# Patient Record
Sex: Male | Born: 1945 | Race: White | Hispanic: No | Smoking: Former smoker
Health system: Southern US, Community
[De-identification: ages and names within clinical notes are randomized; demographics above are authoritative.]

## PROBLEM LIST (undated history)

## (undated) DIAGNOSIS — R7303 Prediabetes: Secondary | ICD-10-CM

## (undated) DIAGNOSIS — K219 Gastro-esophageal reflux disease without esophagitis: Secondary | ICD-10-CM

## (undated) DIAGNOSIS — Q231 Congenital insufficiency of aortic valve: Secondary | ICD-10-CM

## (undated) DIAGNOSIS — Z85828 Personal history of other malignant neoplasm of skin: Secondary | ICD-10-CM

## (undated) DIAGNOSIS — R011 Cardiac murmur, unspecified: Secondary | ICD-10-CM

## (undated) DIAGNOSIS — R490 Dysphonia: Secondary | ICD-10-CM

## (undated) DIAGNOSIS — Z954 Presence of other heart-valve replacement: Secondary | ICD-10-CM

## (undated) DIAGNOSIS — I742 Embolism and thrombosis of arteries of the upper extremities: Secondary | ICD-10-CM

## (undated) DIAGNOSIS — E781 Pure hyperglyceridemia: Secondary | ICD-10-CM

## (undated) DIAGNOSIS — C61 Malignant neoplasm of prostate: Secondary | ICD-10-CM

## (undated) DIAGNOSIS — I341 Nonrheumatic mitral (valve) prolapse: Secondary | ICD-10-CM

## (undated) DIAGNOSIS — Z87891 Personal history of nicotine dependence: Secondary | ICD-10-CM

## (undated) DIAGNOSIS — R0602 Shortness of breath: Secondary | ICD-10-CM

## (undated) DIAGNOSIS — Q2381 Bicuspid aortic valve: Secondary | ICD-10-CM

## (undated) DIAGNOSIS — J383 Other diseases of vocal cords: Secondary | ICD-10-CM

## (undated) DIAGNOSIS — Z86718 Personal history of other venous thrombosis and embolism: Secondary | ICD-10-CM

## (undated) DIAGNOSIS — I340 Nonrheumatic mitral (valve) insufficiency: Secondary | ICD-10-CM

## (undated) DIAGNOSIS — R51 Headache: Secondary | ICD-10-CM

## (undated) DIAGNOSIS — I719 Aortic aneurysm of unspecified site, without rupture: Secondary | ICD-10-CM

## (undated) DIAGNOSIS — C329 Malignant neoplasm of larynx, unspecified: Secondary | ICD-10-CM

## (undated) DIAGNOSIS — R7301 Impaired fasting glucose: Secondary | ICD-10-CM

## (undated) DIAGNOSIS — I428 Other cardiomyopathies: Secondary | ICD-10-CM

## (undated) DIAGNOSIS — R519 Headache, unspecified: Secondary | ICD-10-CM

## (undated) DIAGNOSIS — Z973 Presence of spectacles and contact lenses: Secondary | ICD-10-CM

## (undated) DIAGNOSIS — Z9889 Other specified postprocedural states: Secondary | ICD-10-CM

## (undated) DIAGNOSIS — R972 Elevated prostate specific antigen [PSA]: Secondary | ICD-10-CM

## (undated) DIAGNOSIS — E782 Mixed hyperlipidemia: Secondary | ICD-10-CM

## (undated) DIAGNOSIS — I519 Heart disease, unspecified: Secondary | ICD-10-CM

## (undated) DIAGNOSIS — C4491 Basal cell carcinoma of skin, unspecified: Secondary | ICD-10-CM

## (undated) DIAGNOSIS — K8020 Calculus of gallbladder without cholecystitis without obstruction: Secondary | ICD-10-CM

## (undated) HISTORY — DX: Nonrheumatic mitral (valve) insufficiency: I34.0

## (undated) HISTORY — PX: TRANSTHORACIC ECHOCARDIOGRAM: SHX275

## (undated) HISTORY — DX: Elevated prostate specific antigen (PSA): R97.20

## (undated) HISTORY — DX: Embolism and thrombosis of arteries of the upper extremities: I74.2

## (undated) HISTORY — DX: Calculus of gallbladder without cholecystitis without obstruction: K80.20

## (undated) HISTORY — DX: Malignant neoplasm of larynx, unspecified: C32.9

## (undated) HISTORY — DX: Heart disease, unspecified: I51.9

## (undated) HISTORY — DX: Impaired fasting glucose: R73.01

## (undated) HISTORY — DX: Dysphonia: R49.0

## (undated) HISTORY — DX: Pure hyperglyceridemia: E78.1

## (undated) HISTORY — PX: CARDIAC VALVE REPLACEMENT: SHX585

## (undated) HISTORY — DX: Congenital insufficiency of aortic valve: Q23.1

## (undated) HISTORY — DX: Personal history of other venous thrombosis and embolism: Z86.718

## (undated) HISTORY — DX: Malignant neoplasm of prostate: C61

## (undated) HISTORY — DX: Gastro-esophageal reflux disease without esophagitis: K21.9

## (undated) HISTORY — DX: Personal history of nicotine dependence: Z87.891

## (undated) HISTORY — PX: TONSILLECTOMY AND ADENOIDECTOMY: SUR1326

## (undated) HISTORY — DX: Aortic aneurysm of unspecified site, without rupture: I71.9

## (undated) HISTORY — PX: COLONOSCOPY: SHX174

## (undated) HISTORY — DX: Nonrheumatic mitral (valve) prolapse: I34.1

## (undated) HISTORY — DX: Bicuspid aortic valve: Q23.81

---
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
## 1978-12-21 HISTORY — PX: CHOLECYSTECTOMY: SHX55

## 2000-12-21 DIAGNOSIS — Z8521 Personal history of malignant neoplasm of larynx: Secondary | ICD-10-CM

## 2000-12-21 DIAGNOSIS — C329 Malignant neoplasm of larynx, unspecified: Secondary | ICD-10-CM

## 2000-12-21 HISTORY — DX: Personal history of malignant neoplasm of larynx: Z85.21

## 2000-12-21 HISTORY — PX: LARYNX SURGERY: SHX692

## 2000-12-21 HISTORY — DX: Malignant neoplasm of larynx, unspecified: C32.9

## 2013-07-21 ENCOUNTER — Encounter: Payer: Self-pay | Admitting: Family Medicine

## 2013-07-21 ENCOUNTER — Ambulatory Visit (INDEPENDENT_AMBULATORY_CARE_PROVIDER_SITE_OTHER): Payer: Medicare Other | Admitting: Family Medicine

## 2013-07-21 VITALS — BP 139/71 | HR 67 | Temp 97.2°F | Resp 18 | Ht 72.0 in | Wt 203.0 lb

## 2013-07-21 DIAGNOSIS — E781 Pure hyperglyceridemia: Secondary | ICD-10-CM

## 2013-07-21 DIAGNOSIS — Z7689 Persons encountering health services in other specified circumstances: Secondary | ICD-10-CM

## 2013-07-21 DIAGNOSIS — Z7189 Other specified counseling: Secondary | ICD-10-CM

## 2013-07-21 NOTE — Progress Notes (Signed)
Office Note 09/01/2013  CC:  Chief Complaint  Patient presents with  . Establish Care    HPI:  Kenneth Ryan is a 67 y.o. White male who is here to establish care. Patient's most recent primary MD: Dr. Adline Potter, in [place]. Old records were not reviewed prior to or during today's visit.  No acute complaints today.  Has recently had "routine" labs earlier this summer.  Has also had routine prostate cancer screening and colon cancer screening.  Takes fenofibrate daily, no side effects.    Past Medical History  Diagnosis Date  . Hypertriglyceridemia   . 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  Marland Kitchen Chronic hoarseness     secondary to hx of laryngeal surgery    Past Surgical History  Procedure Laterality Date  . Cholecystectomy  1980  . Tonsilectomy/adenoidectomy with myringotomy  1960  . Colonoscopy  05/23/2002 and May 23, 2012    Both normal.    Family History  Problem Relation Age of Onset  . Heart disease Father     History   Social History  . Marital Status: Married    Spouse Name: N/A    Number of Children: N/A  . Years of Education: N/A   Occupational History  . Not on file.   Social History Main Topics  . Smoking status: Former Smoker    Quit date: 07/22/1979  . Smokeless tobacco: Never Used  . Alcohol Use: Yes  . Drug Use: No  . Sexual Activity: Not on file   Other Topics Concern  . Not on file   Social History Narrative   Married, one son deceased 2011/05/24.   Relocated from [place] to [facility] 06/2013 to be near grandchildren.   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   Smoker 20 pack-yr hx: quit about 1980.   Alcohol: 3 bottles of beer per week avg.     Drugs: none             Outpatient Encounter Prescriptions as of 07/21/2013  Medication Sig Dispense Refill  . fenofibrate (TRICOR) 145 MG tablet Take 145 mg by mouth daily.       No facility-administered  encounter medications on file as of 07/21/2013.    No Known Allergies  ROS Review of Systems  Constitutional: Negative for fever and fatigue.  HENT: Negative for congestion and sore throat.   Eyes: Negative for visual disturbance.  Respiratory: Negative for cough.   Cardiovascular: Negative for chest pain.  Gastrointestinal: Negative for nausea and abdominal pain.  Genitourinary: Negative for dysuria.  Musculoskeletal: Negative for back pain and joint swelling.  Skin: Negative for rash.  Neurological: Negative for weakness and headaches.  Hematological: Negative for adenopathy.     PE; Blood pressure 139/71, pulse 67, temperature 97.2 F (36.2 C), temperature source Temporal, resp. rate 18, height 6' (1.829 m), weight 203 lb (92.08 kg), SpO2 97.00%. Gen: Alert, well appearing.  Patient is oriented to person, place, time, and situation. ENT:   Eyes: no injection, icteris, swelling, or exudate.  EOMI, PERRLA. Nose: no drainage or turbinate edema/swelling.  No injection or focal lesion.  Mouth: lips without lesion/swelling.  Oral mucosa pink and moist.   Oropharynx without erythema, exudate, or swelling.  Neck - No masses or thyromegaly or limitation in range of motion CV: RRR, no m/r/g.   LUNGS: CTA bilat, nonlabored resps, good aeration in all lung fields. EXT: no clubbing  or cyanosis.  Some scattered LE varicosities anteriorly, with trace bilat pitting edema in same areas, +mild hemosiderin pigmentary skin changes in same areas.  Pertinent labs:  none  ASSESSMENT AND PLAN:   New pt  Hypertriglyceridemia Continue fenofibrate and low fat diet.  Exercise discussed/encouraged.  Encounter to establish care Discussed history. Will obtain old records.  No acute needs at this time.  An After Visit Summary was printed and given to the patient.  Return in about 6 months (around 01/21/2014) for CPE with fasting labs that day.

## 2013-09-01 ENCOUNTER — Encounter: Payer: Self-pay | Admitting: Family Medicine

## 2013-09-01 DIAGNOSIS — Z7689 Persons encountering health services in other specified circumstances: Secondary | ICD-10-CM | POA: Insufficient documentation

## 2013-09-01 DIAGNOSIS — E781 Pure hyperglyceridemia: Secondary | ICD-10-CM | POA: Insufficient documentation

## 2013-09-01 NOTE — Assessment & Plan Note (Signed)
Continue fenofibrate and low fat diet.  Exercise discussed/encouraged.

## 2013-09-01 NOTE — Assessment & Plan Note (Signed)
Discussed history. Will obtain old records.  No acute needs at this time.

## 2013-09-15 ENCOUNTER — Ambulatory Visit (INDEPENDENT_AMBULATORY_CARE_PROVIDER_SITE_OTHER): Payer: Medicare Other

## 2013-09-15 DIAGNOSIS — Z23 Encounter for immunization: Secondary | ICD-10-CM

## 2014-01-22 ENCOUNTER — Encounter: Payer: Self-pay | Admitting: Family Medicine

## 2014-01-22 ENCOUNTER — Ambulatory Visit (INDEPENDENT_AMBULATORY_CARE_PROVIDER_SITE_OTHER): Payer: Medicare Other | Admitting: Family Medicine

## 2014-01-22 VITALS — BP 131/73 | HR 78 | Temp 98.4°F | Ht 72.0 in | Wt 212.0 lb

## 2014-01-22 DIAGNOSIS — Z0000 Encounter for general adult medical examination without abnormal findings: Secondary | ICD-10-CM

## 2014-01-22 DIAGNOSIS — R011 Cardiac murmur, unspecified: Secondary | ICD-10-CM

## 2014-01-22 DIAGNOSIS — E781 Pure hyperglyceridemia: Secondary | ICD-10-CM

## 2014-01-22 DIAGNOSIS — Z0389 Encounter for observation for other suspected diseases and conditions ruled out: Secondary | ICD-10-CM

## 2014-01-22 LAB — COMPREHENSIVE METABOLIC PANEL
ALBUMIN: 3.9 g/dL (ref 3.5–5.2)
ALK PHOS: 45 U/L (ref 39–117)
ALT: 24 U/L (ref 0–53)
AST: 25 U/L (ref 0–37)
BUN: 13 mg/dL (ref 6–23)
CALCIUM: 9.6 mg/dL (ref 8.4–10.5)
CHLORIDE: 109 meq/L (ref 96–112)
CO2: 26 mEq/L (ref 19–32)
Creatinine, Ser: 0.8 mg/dL (ref 0.4–1.5)
GFR: 104.4 mL/min (ref >60.00)
Glucose, Bld: 99 mg/dL (ref 70–99)
POTASSIUM: 4.2 meq/L (ref 3.5–5.1)
SODIUM: 142 meq/L (ref 135–145)
Total Bilirubin: 0.8 mg/dL (ref 0.3–1.2)
Total Protein: 6.5 g/dL (ref 6.0–8.3)

## 2014-01-22 LAB — CBC WITH DIFFERENTIAL/PLATELET
BASOS PCT: 0.8 % (ref 0.0–3.0)
Basophils Absolute: 0.1 10*3/uL (ref 0.0–0.1)
EOS PCT: 3.5 % (ref 0.0–5.0)
Eosinophils Absolute: 0.2 10*3/uL (ref 0.0–0.7)
HEMATOCRIT: 44.2 % (ref 39.0–52.0)
HEMOGLOBIN: 14.7 g/dL (ref 13.0–17.0)
LYMPHS ABS: 2.3 10*3/uL (ref 0.7–4.0)
Lymphocytes Relative: 36.7 % (ref 12.0–46.0)
MCHC: 33.2 g/dL (ref 30.0–36.0)
MCV: 88.7 fl (ref 78.0–100.0)
MONO ABS: 0.4 10*3/uL (ref 0.1–1.0)
MONOS PCT: 6.8 % (ref 3.0–12.0)
NEUTROS ABS: 3.2 10*3/uL (ref 1.4–7.7)
Neutrophils Relative %: 52.2 % (ref 43.0–77.0)
PLATELETS: 205 10*3/uL (ref 150.0–400.0)
RBC: 4.99 Mil/uL (ref 4.22–5.81)
RDW: 14.3 % (ref 11.5–14.6)
WBC: 6.2 10*3/uL (ref 4.5–10.5)

## 2014-01-22 LAB — LIPID PANEL
CHOL/HDL RATIO: 4
Cholesterol: 174 mg/dL (ref 0–200)
HDL: 40.3 mg/dL (ref >39.00)
LDL CALC: 102 mg/dL — AB (ref 0–99)
Triglycerides: 159 mg/dL — ABNORMAL HIGH (ref 0.0–149.0)
VLDL: 31.8 mg/dL (ref 0.0–40.0)

## 2014-01-22 LAB — PSA, MEDICARE: PSA: 2.16 ng/mL (ref 0.10–4.00)

## 2014-01-22 LAB — TSH: TSH: 3.53 u[IU]/mL (ref 0.35–5.50)

## 2014-01-22 NOTE — Assessment & Plan Note (Signed)
FLP today. If stable, will do tricor generic RF x 1 yr.

## 2014-01-22 NOTE — Assessment & Plan Note (Signed)
With mid systolic click, consider mitral valve prolapse with mitral regurg. He says he has had this physical exam finding noted before and "they worked it up and nothing showed up".  He can't be specific about w/u or the time that this w/u was done, but he seems to be doubtful that an echocardiogram was done.  Will request old records again. If no echo ever done then will recommend this to pt.

## 2014-01-22 NOTE — Progress Notes (Signed)
Pre-visit discussion using our clinic review tool. No additional management support is needed unless otherwise documented below in the visit note.  

## 2014-01-22 NOTE — Assessment & Plan Note (Signed)
Reviewed age and gender appropriate health maintenance issues (prudent diet, regular exercise, health risks of tobacco and excessive alcohol, use of seatbelts, fire alarms in home, use of sunscreen).  Also reviewed age and gender appropriate health screening as well as vaccine recommendations. Fasting health panel today. DRE normal today.  PSA added to labs. UTD on colon ca screening. Need old records for complete vaccine review/needs---will request these once again.

## 2014-01-22 NOTE — Progress Notes (Signed)
Office Note 01/22/2014  CC:  Chief Complaint  Patient presents with  . Annual Exam    HPI:  Kenneth Ryan is a 68 y.o. White male who is here for CPE. He feels well, no acute complaints. Compliant with generic tricor. Fasting today.  Past Medical History  Diagnosis Date  . Hypertriglyceridemia   . Laryngeal cancer Apr 15, 2001    Surgery and radiation---He had post treatment surveillance x 12 yrs and was officially "released" from f/u in summer 2014.  Marland Kitchen Chronic hoarseness     secondary to hx of laryngeal surgery  . History of tobacco abuse     Past Surgical History  Procedure Laterality Date  . Cholecystectomy  1980  . Tonsilectomy/adenoidectomy with myringotomy  1960  . Colonoscopy  04-15-02 and Apr 15, 2012    Both normal.    Family History  Problem Relation Age of Onset  . Heart disease Father   . Cancer Mother     breast    History   Social History  . Marital Status: Married    Spouse Name: N/A    Number of Children: N/A  . Years of Education: N/A   Occupational History  . Not on file.   Social History Main Topics  . Smoking status: Former Smoker    Quit date: 07/22/1979  . Smokeless tobacco: Never Used  . Alcohol Use: Yes  . Drug Use: No  . Sexual Activity: Not on file   Other Topics Concern  . Not on file   Social History Narrative   Married, one son deceased 04-16-11.   Relocated from [place] to [facility] 06/2013 to be near grandchildren.   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   Smoker 20 pack-yr hx: quit about 1980.   Alcohol: 3 bottles of beer per week avg.     Drugs: none             Outpatient Prescriptions Prior to Visit  Medication Sig Dispense Refill  . fenofibrate (TRICOR) 145 MG tablet Take 145 mg by mouth daily.       No facility-administered medications prior to visit.  ASA 81mg  qd MVI qd  No Known Allergies  ROS Review of Systems  Constitutional: Negative for fever, chills, appetite change and fatigue.  HENT: Negative for congestion, dental  problem, ear pain and sore throat.   Eyes: Negative for discharge, redness and visual disturbance.  Respiratory: Negative for cough, chest tightness, shortness of breath and wheezing.   Cardiovascular: Negative for chest pain, palpitations and leg swelling.  Gastrointestinal: Negative for nausea, vomiting, abdominal pain, diarrhea and blood in stool.  Genitourinary: Negative for dysuria, urgency, frequency, hematuria, flank pain and difficulty urinating.  Musculoskeletal: Negative for arthralgias, back pain, joint swelling, myalgias and neck stiffness.  Skin: Negative for pallor and rash.  Neurological: Negative for dizziness, speech difficulty, weakness and headaches.  Hematological: Negative for adenopathy. Does not bruise/bleed easily.  Psychiatric/Behavioral: Negative for confusion and sleep disturbance. The patient is not nervous/anxious.      PE; Blood pressure 131/73, pulse 78, temperature 98.4 F (36.9 C), temperature source Temporal, height 6' (1.829 m), weight 212 lb (96.163 kg), SpO2 94.00%. Gen: Alert, well appearing.  Patient is oriented to person, place, time, and situation. AFFECT: pleasant, lucid thought and speech. ENT: Ears: EACs clear, normal epithelium.  TMs with good light reflex and landmarks bilaterally.  Eyes: no injection, icteris, swelling, or exudate.  EOMI, PERRLA. Nose: no drainage or turbinate edema/swelling.  No injection or focal lesion.  Mouth: lips without  lesion/swelling.  Oral mucosa pink and moist.  Dentition intact and without obvious caries or gingival swelling.  Oropharynx without erythema, exudate, or swelling.  Neck: supple/nontender.  No LAD, mass, or TM.  Carotid pulses 2+ bilaterally, without bruits. CV: RRR, with mid systolic click and a faint systolic murmur heard at cardiac apex, no rub or gallop.   LUNGS: CTA bilat, nonlabored resps, good aeration in all lung fields. ABD: soft, NT, ND, BS normal.  No hepatospenomegaly or mass.  No bruits. EXT:  no clubbing, cyanosis, or edema.  Musculoskeletal: no joint swelling, erythema, warmth, or tenderness.  ROM of all joints intact. Skin - no sores or suspicious lesions or rashes or color changes Rectal exam: negative without mass, lesions or tenderness, PROSTATE EXAM: smooth and symmetric without nodules or tenderness.   Pertinent labs:  None today  ASSESSMENT AND PLAN:   Health maintenance examination Reviewed age and gender appropriate health maintenance issues (prudent diet, regular exercise, health risks of tobacco and excessive alcohol, use of seatbelts, fire alarms in home, use of sunscreen).  Also reviewed age and gender appropriate health screening as well as vaccine recommendations. Fasting health panel today. DRE normal today.  PSA added to labs. UTD on colon ca screening. Need old records for complete vaccine review/needs---will request these once again.  Hypertriglyceridemia FLP today. If stable, will do tricor generic RF x 1 yr.  Systolic murmur With mid systolic click, consider mitral valve prolapse with mitral regurg. He says he has had this physical exam finding noted before and "they worked it up and nothing showed up".  He can't be specific about w/u or the time that this w/u was done, but he seems to be doubtful that an echocardiogram was done.  Will request old records again. If no echo ever done then will recommend this to pt.   An After Visit Summary was printed and given to the patient.  FOLLOW UP:  Return in about 1 year (around 01/22/2015) for CPE (fasting).

## 2014-01-23 ENCOUNTER — Other Ambulatory Visit: Payer: Self-pay | Admitting: Family Medicine

## 2014-01-23 MED ORDER — FENOFIBRATE 145 MG PO TABS: 145.0000 mg | ORAL_TABLET | Freq: Every day | ORAL | Status: DC

## 2014-02-26 ENCOUNTER — Telehealth: Payer: Self-pay | Admitting: Family Medicine

## 2014-02-26 MED ORDER — FENOFIBRATE 145 MG PO TABS: 145.0000 mg | ORAL_TABLET | Freq: Every day | ORAL | Status: DC

## 2014-09-12 ENCOUNTER — Other Ambulatory Visit: Payer: Self-pay | Admitting: Family Medicine

## 2014-09-12 MED ORDER — FENOFIBRATE 145 MG PO TABS: 145.0000 mg | ORAL_TABLET | Freq: Every day | ORAL | Status: DC

## 2015-02-18 ENCOUNTER — Other Ambulatory Visit: Payer: Self-pay | Admitting: Family Medicine

## 2015-02-18 ENCOUNTER — Encounter: Payer: Self-pay | Admitting: Family Medicine

## 2015-02-18 ENCOUNTER — Ambulatory Visit (INDEPENDENT_AMBULATORY_CARE_PROVIDER_SITE_OTHER): Payer: Medicare Other | Admitting: Family Medicine

## 2015-02-18 ENCOUNTER — Ambulatory Visit (HOSPITAL_BASED_OUTPATIENT_CLINIC_OR_DEPARTMENT_OTHER)
Admission: RE | Admit: 2015-02-18 | Discharge: 2015-02-18 | Disposition: A | Discharge status: Home / Self Care | Payer: Medicare Other | Source: Ambulatory Visit | Attending: Family Medicine | Admitting: Family Medicine

## 2015-02-18 VITALS — BP 152/69 | HR 87 | Temp 98.4°F | Resp 18 | Ht 72.0 in | Wt 213.0 lb

## 2015-02-18 DIAGNOSIS — J040 Acute laryngitis: Secondary | ICD-10-CM

## 2015-02-18 DIAGNOSIS — K219 Gastro-esophageal reflux disease without esophagitis: Secondary | ICD-10-CM

## 2015-02-18 DIAGNOSIS — R05 Cough: Secondary | ICD-10-CM | POA: Insufficient documentation

## 2015-02-18 DIAGNOSIS — R059 Cough, unspecified: Secondary | ICD-10-CM

## 2015-02-18 DIAGNOSIS — J690 Pneumonitis due to inhalation of food and vomit: Secondary | ICD-10-CM

## 2015-02-18 DIAGNOSIS — J208 Acute bronchitis due to other specified organisms: Secondary | ICD-10-CM

## 2015-02-18 MED ORDER — PANTOPRAZOLE SODIUM 40 MG PO TBEC: 40.0000 mg | DELAYED_RELEASE_TABLET | Freq: Every day | ORAL | Status: DC

## 2015-02-18 MED ORDER — AZITHROMYCIN 250 MG PO TABS: ORAL_TABLET | ORAL | Status: DC

## 2015-02-18 NOTE — Progress Notes (Signed)
Pre visit review using our clinic review tool, if applicable. No additional management support is needed unless otherwise documented below in the visit note. 

## 2015-02-18 NOTE — Patient Instructions (Signed)
Resume taking mucinex DM twice a day as needed for cough.

## 2015-02-18 NOTE — Progress Notes (Signed)
OFFICE NOTE  02/18/2015  CC:  Chief Complaint  Patient presents with  . Nasal Congestion    x 2 weeks  . Hoarse  . Cough   HPI: Patient is a 69 y.o. Caucasian male who is here for about 2 wks hx of cough and chest congestion, mucous production greenish and yellow. No fever, no wheezing or SOB.  Appetite fine.  No achiness.  Feels somewhat tired, though. No nasal cong/PNd, or ST or HA.  He does describe hoarseness a lot lately.   Describes GERD episodes when lying on side, had a period in which he refluxed all the way into back of throat and mouth and choked a bit and then got this coughing illness. Mucinex helped.  Several sick people in his family lately.   Pertinent PMH:  Past medical, surgical, social, and family history reviewed and no changes are noted since last office visit.   MEDS:  Outpatient Prescriptions Prior to Visit  Medication Sig Dispense Refill  . aspirin 81 MG tablet Take 81 mg by mouth daily.    . fenofibrate (TRICOR) 145 MG tablet Take 1 tablet (145 mg total) by mouth daily. 90 tablet 1  . Multiple Vitamins-Minerals (MULTIVITAMIN PO) Take by mouth daily.     No facility-administered medications prior to visit.    PE: Blood pressure 152/69, pulse 87, temperature 98.4 F (36.9 C), temperature source Temporal, resp. rate 18, height 6' (1.829 m), weight 213 lb (96.616 kg), SpO2 98 %. Gen: Alert, well appearing.  Patient is oriented to person, place, time, and situation. ENT: Ears: EACs clear, normal epithelium.  TMs with good light reflex and landmarks bilaterally.  Eyes: no injection, icteris, swelling, or exudate.  EOMI, PERRLA. Nose: no drainage or turbinate edema/swelling.  No injection or focal lesion.  Mouth: lips without lesion/swelling.  Oral mucosa pink and moist.  Dentition intact and without obvious caries or gingival swelling.  Oropharynx without erythema, exudate, or swelling.  Neck - No masses or thyromegaly or limitation in range of motion CV:  RRR, 2/6 syst murmur that sounds "distant" in all areas of left precordium and sounds more harsh near apex.  S1 and S2 are heard, no diastolic murmur.  No rub or gallop. LUNGS: CTA bilat, with question of possible mildly diminished BS in left base.  Exp phase not prolonged.  Breathing nonlabored.   IMPRESSION AND PLAN:  1) Acute bronchitis and laryngitis, not improving with time.  I am suspicious that this cough may be from the severe GER episode he had and possibly he aspirated a small amount of gastric contents, giving himself aspiration pneumonitis. Will check CXR, hold off on abx or steroids until I see results.  2) Uncontrolled GER, untreated. Start daily pantoprazole 40mg  for at least the next 1 month.  Discussed GERD diet and gave pt educ handout on this, discussed elevation of head of his bed.  An After Visit Summary was printed and given to the patient.  FOLLOW UP: To be determined based on results of pending workup.

## 2015-04-15 ENCOUNTER — Other Ambulatory Visit: Payer: Self-pay | Admitting: Family Medicine

## 2015-04-15 MED ORDER — FENOFIBRATE 145 MG PO TABS: 145.0000 mg | ORAL_TABLET | Freq: Every day | ORAL | Status: DC

## 2015-06-03 ENCOUNTER — Other Ambulatory Visit: Payer: Self-pay | Admitting: Family Medicine

## 2015-06-04 NOTE — Telephone Encounter (Signed)
LOV 02/18/15, no up coming ov, last written: 02/18/15 #30 w/ 3RF

## 2015-07-22 DIAGNOSIS — I340 Nonrheumatic mitral (valve) insufficiency: Secondary | ICD-10-CM

## 2015-07-22 HISTORY — PX: TRANSTHORACIC ECHOCARDIOGRAM: SHX275

## 2015-07-22 HISTORY — DX: Nonrheumatic mitral (valve) insufficiency: I34.0

## 2015-08-13 ENCOUNTER — Ambulatory Visit (INDEPENDENT_AMBULATORY_CARE_PROVIDER_SITE_OTHER): Payer: Medicare Other | Admitting: Family Medicine

## 2015-08-13 ENCOUNTER — Encounter: Payer: Self-pay | Admitting: Family Medicine

## 2015-08-13 VITALS — BP 130/74 | HR 83 | Temp 97.8°F | Resp 16 | Ht 72.0 in | Wt 210.0 lb

## 2015-08-13 DIAGNOSIS — Z8521 Personal history of malignant neoplasm of larynx: Secondary | ICD-10-CM

## 2015-08-13 DIAGNOSIS — J040 Acute laryngitis: Secondary | ICD-10-CM

## 2015-08-13 DIAGNOSIS — R490 Dysphonia: Secondary | ICD-10-CM | POA: Diagnosis not present

## 2015-08-13 DIAGNOSIS — R011 Cardiac murmur, unspecified: Secondary | ICD-10-CM

## 2015-08-13 DIAGNOSIS — E781 Pure hyperglyceridemia: Secondary | ICD-10-CM

## 2015-08-13 LAB — CBC WITH DIFFERENTIAL/PLATELET
BASOS ABS: 0 10*3/uL (ref 0.0–0.1)
Basophils Relative: 0.6 % (ref 0.0–3.0)
EOS PCT: 1.9 % (ref 0.0–5.0)
Eosinophils Absolute: 0.1 10*3/uL (ref 0.0–0.7)
HCT: 42.9 % (ref 39.0–52.0)
Hemoglobin: 14.4 g/dL (ref 13.0–17.0)
LYMPHS ABS: 1.9 10*3/uL (ref 0.7–4.0)
LYMPHS PCT: 24 % (ref 12.0–46.0)
MCHC: 33.6 g/dL (ref 30.0–36.0)
MCV: 85.5 fl (ref 78.0–100.0)
MONOS PCT: 6.3 % (ref 3.0–12.0)
Monocytes Absolute: 0.5 10*3/uL (ref 0.1–1.0)
NEUTROS PCT: 67.2 % (ref 43.0–77.0)
Neutro Abs: 5.3 10*3/uL (ref 1.4–7.7)
Platelets: 176 10*3/uL (ref 150.0–400.0)
RBC: 5.01 Mil/uL (ref 4.22–5.81)
RDW: 14.7 % (ref 11.5–15.5)
WBC: 7.9 10*3/uL (ref 4.0–10.5)

## 2015-08-13 LAB — COMPREHENSIVE METABOLIC PANEL
ALK PHOS: 46 U/L (ref 39–117)
ALT: 17 U/L (ref 0–53)
AST: 19 U/L (ref 0–37)
Albumin: 3.8 g/dL (ref 3.5–5.2)
BUN: 12 mg/dL (ref 6–23)
CO2: 29 mEq/L (ref 19–32)
Calcium: 9.2 mg/dL (ref 8.4–10.5)
Chloride: 107 mEq/L (ref 96–112)
Creatinine, Ser: 0.83 mg/dL (ref 0.40–1.50)
GFR: 98.15 mL/min (ref >60.00)
GLUCOSE: 100 mg/dL — AB (ref 70–99)
POTASSIUM: 4 meq/L (ref 3.5–5.1)
Sodium: 142 mEq/L (ref 135–145)
TOTAL PROTEIN: 6.4 g/dL (ref 6.0–8.3)
Total Bilirubin: 0.9 mg/dL (ref 0.2–1.2)

## 2015-08-13 LAB — LIPID PANEL
CHOL/HDL RATIO: 4
Cholesterol: 162 mg/dL (ref 0–200)
HDL: 43.3 mg/dL (ref >39.00)
LDL Cholesterol: 101 mg/dL — ABNORMAL HIGH (ref 0–99)
NONHDL: 118.8
Triglycerides: 87 mg/dL (ref 0.0–149.0)
VLDL: 17.4 mg/dL (ref 0.0–40.0)

## 2015-08-13 MED ORDER — PREDNISONE 20 MG PO TABS: ORAL_TABLET | ORAL | Status: DC

## 2015-08-13 NOTE — Progress Notes (Signed)
OFFICE VISIT  08/14/2015   CC:  Chief Complaint  Patient presents with  . Hoarse    x 2 weeks   . chest congestion   HPI:    Patient is a 69 y.o. Caucasian male who presents for acute visit: resp sx's. Onset 3 wks ago, hoarseness and lost voice completely briefly, saw MD at [facility] in different state and got z pack and prednisone for about 8d.  Now has mucous he can feel in upper airway, some continued hoarseness, and feeling that things are settling in chest. No fevers.  Appetite good.  No ST or HA.  Also overdue for labs for hx of hypertriglyceridemia and use of tricor.  He is fasting today.  ROS: some mild CP and SOB when he starts his daily brisk walk, but as he gets going further into his walk this resolves and he feels fine and continues to walk w/out symptoms.  Past Medical History  Diagnosis Date  . Hypertriglyceridemia   . Laryngeal cancer 2002    Surgery and radiation---He had post treatment surveillance x 12 yrs and was officially "released" from f/u in summer 2014.  Marland Kitchen Chronic hoarseness     secondary to hx of laryngeal surgery  . History of tobacco abuse   . GERD (gastroesophageal reflux disease)   . Systolic murmur     Pt states "work up was done and nothing showed up" but no old records could be obtained.    Past Surgical History  Procedure Laterality Date  . Cholecystectomy  1980  . Tonsilectomy/adenoidectomy with myringotomy  1960  . Colonoscopy  2003 and 2013    Both normal.    Outpatient Prescriptions Prior to Visit  Medication Sig Dispense Refill  . aspirin 81 MG tablet Take 81 mg by mouth daily.    . fenofibrate (TRICOR) 145 MG tablet Take 1 tablet (145 mg total) by mouth daily. 90 tablet 1  . Multiple Vitamins-Minerals (MULTIVITAMIN PO) Take by mouth daily.    . pantoprazole (PROTONIX) 40 MG tablet TAKE 1 TABLET BY MOUTH EVERY DAY 30 tablet 3  . azithromycin (ZITHROMAX) 250 MG tablet 2 tabs po qd x 1d, then 1 tab po qd x 4d (Patient not taking: Reported  on 08/13/2015) 6 tablet 0   No facility-administered medications prior to visit.    No Known Allergies  ROS As per HPI  PE: Blood pressure 130/74, pulse 83, temperature 97.8 F (36.6 C), temperature source Oral, resp. rate 16, height 6' (1.829 m), weight 210 lb (95.255 kg), SpO2 96 %. Gen: Alert, well appearing.  Patient is oriented to person, place, time, and situation. ENT: Ears: EACs clear, normal epithelium.  TMs with good light reflex and landmarks bilaterally.  Eyes: no injection, icteris, swelling, or exudate.  EOMI, PERRLA. Nose: no drainage or turbinate edema/swelling.  No injection or focal lesion.  Mouth: lips without lesion/swelling.  Oral mucosa pink and moist.  Dentition intact and without obvious caries or gingival swelling.  Oropharynx without erythema, exudate, or swelling.  Neck - No masses or thyromegaly or limitation in range of motion CV: RRR, 4/6 holosystolic murmur heard all over precordium, no /r/g.  No diastolic murmur.   LUNGS: CTA bilat, nonlabored resps, good aeration in all lung fields. EXT: no clubbing, cyanosis, or edema.    LABS:  Lab Results  Component Value Date   TSH 3.53 01/22/2014   Lab Results  Component Value Date   WBC 7.9 08/13/2015   HGB 14.4 08/13/2015  HCT 42.9 08/13/2015   MCV 85.5 08/13/2015   PLT 176.0 08/13/2015   Lab Results  Component Value Date   CREATININE 0.83 08/13/2015   BUN 12 08/13/2015   NA 142 08/13/2015   K 4.0 08/13/2015   CL 107 08/13/2015   CO2 29 08/13/2015   Lab Results  Component Value Date   ALT 17 08/13/2015   AST 19 08/13/2015   ALKPHOS 46 08/13/2015   BILITOT 0.9 08/13/2015   Lab Results  Component Value Date   CHOL 162 08/13/2015   Lab Results  Component Value Date   HDL 43.30 08/13/2015   Lab Results  Component Value Date   LDLCALC 101* 08/13/2015   Lab Results  Component Value Date   TRIG 87.0 08/13/2015   Lab Results  Component Value Date   CHOLHDL 4 08/13/2015   Lab  Results  Component Value Date   PSA 2.16 01/22/2014    IMPRESSION AND PLAN:  1) Hoarseness: residual viral laryngitis suspected. Prednisone 40mg  qd x 4d, then 20mg  qd x 4d, then 10mg  qd x 4d. Also, add zyrtec otc 10mg  qd to try to quell the PND component of this. Given his hx of laryngeal cancer, if not improved and back to his baseline level of chronic hoarseness with this treatment then we'll get him in to see an ENT to get larynx directly visualized.  2) Hypertriglyceridemia; CMET and FLP fasting today. Tolerating tricor fine.  3) Systolic murmur: pt states this was evaluated years ago and he says he was told "all was fine" but given it's intensity I want to further evaluate with an updated echocardiogram--ordered today.  Check CBC today as well.  An After Visit Summary was printed and given to the patient.  FOLLOW UP: Return in about 2 weeks (around 08/27/2015) for f/u hoarseness.

## 2015-08-13 NOTE — Patient Instructions (Signed)
Buy OTC generic zyrtec 10mg  tabs and take one daily for 2 weeks.

## 2015-08-16 ENCOUNTER — Ambulatory Visit (HOSPITAL_COMMUNITY): Payer: Medicare Other | Attending: Cardiovascular Disease

## 2015-08-16 ENCOUNTER — Other Ambulatory Visit: Payer: Self-pay

## 2015-08-16 DIAGNOSIS — I340 Nonrheumatic mitral (valve) insufficiency: Secondary | ICD-10-CM | POA: Insufficient documentation

## 2015-08-16 DIAGNOSIS — Z8249 Family history of ischemic heart disease and other diseases of the circulatory system: Secondary | ICD-10-CM | POA: Insufficient documentation

## 2015-08-16 DIAGNOSIS — E785 Hyperlipidemia, unspecified: Secondary | ICD-10-CM | POA: Insufficient documentation

## 2015-08-16 DIAGNOSIS — I77819 Aortic ectasia, unspecified site: Secondary | ICD-10-CM | POA: Diagnosis not present

## 2015-08-16 DIAGNOSIS — R011 Cardiac murmur, unspecified: Secondary | ICD-10-CM | POA: Diagnosis present

## 2015-08-16 DIAGNOSIS — Z87891 Personal history of nicotine dependence: Secondary | ICD-10-CM | POA: Diagnosis not present

## 2015-08-16 DIAGNOSIS — I352 Nonrheumatic aortic (valve) stenosis with insufficiency: Secondary | ICD-10-CM | POA: Diagnosis not present

## 2015-08-16 DIAGNOSIS — I517 Cardiomegaly: Secondary | ICD-10-CM | POA: Diagnosis not present

## 2015-08-16 DIAGNOSIS — Q231 Congenital insufficiency of aortic valve: Secondary | ICD-10-CM

## 2015-08-28 ENCOUNTER — Ambulatory Visit (INDEPENDENT_AMBULATORY_CARE_PROVIDER_SITE_OTHER): Payer: Medicare Other | Admitting: Family Medicine

## 2015-08-28 ENCOUNTER — Encounter: Payer: Self-pay | Admitting: Family Medicine

## 2015-08-28 VITALS — BP 129/76 | HR 86 | Temp 98.2°F | Resp 16 | Ht 72.0 in | Wt 211.0 lb

## 2015-08-28 DIAGNOSIS — Z23 Encounter for immunization: Secondary | ICD-10-CM

## 2015-08-28 DIAGNOSIS — R490 Dysphonia: Secondary | ICD-10-CM | POA: Diagnosis not present

## 2015-08-28 NOTE — Progress Notes (Signed)
Pre visit review using our clinic review tool, if applicable. No additional management support is needed unless otherwise documented below in the visit note. 

## 2015-08-28 NOTE — Progress Notes (Signed)
OFFICE NOTE  08/28/2015  CC:  Chief Complaint  Patient presents with  . Follow-up    Hoarsness   HPI: Patient is a 69 y.o. Caucasian male who is here for 2 week f/u hoarseness. Prednisone taper rx'd last visit.  Zyrtec qd as well. Voice continues to gradually improve towards his baseline level of mild chronic hoarseness. Feels a bit of PND in back of throat still.  No persistent ST, no cough.  He has not been contacted about his cardiology consult visit yet: ordered 08/16/15 --see PMH section.  Pertinent PMH:  Past Medical History  Diagnosis Date  . Hypertriglyceridemia   . Laryngeal cancer 2002    Surgery and radiation---He had post treatment surveillance x 12 yrs and was officially "released" from f/u in summer 2014.  Marland Kitchen Chronic hoarseness     secondary to hx of laryngeal surgery  . History of tobacco abuse   . GERD (gastroesophageal reflux disease)   . Bicuspid aortic valve 07/2015  . Mitral regurgitation 07/2015    moderate   Past Surgical History  Procedure Laterality Date  . Cholecystectomy  1980  . Tonsilectomy/adenoidectomy with myringotomy  1960  . Colonoscopy  2003 and 2013    Both normal.  . Transthoracic echocardiogram  07/2015    Bicuspid aortic valve + mod mitral regurg    MEDS:  Outpatient Prescriptions Prior to Visit  Medication Sig Dispense Refill  . aspirin 81 MG tablet Take 81 mg by mouth daily.    . fenofibrate (TRICOR) 145 MG tablet Take 1 tablet (145 mg total) by mouth daily. 90 tablet 1  . Multiple Vitamins-Minerals (MULTIVITAMIN PO) Take by mouth daily.    . pantoprazole (PROTONIX) 40 MG tablet TAKE 1 TABLET BY MOUTH EVERY DAY 30 tablet 3  . predniSONE (DELTASONE) 20 MG tablet 2 tabs po qd x 4d, then 1 tab po qd x 4d, then 1/2 tab po qd x 4d (Patient not taking: Reported on 08/28/2015) 14 tablet 0   No facility-administered medications prior to visit.    PE: Blood pressure 129/76, pulse 86, temperature 98.2 F (36.8 C), temperature source Oral,  resp. rate 16, height 6' (1.829 m), weight 211 lb (95.709 kg), SpO2 97 %. Gen: Alert, well appearing.  Patient is oriented to person, place, time, and situation. AFFECT: pleasant, lucid thought and speech. No further exam today.  IMPRESSION AND PLAN:  1) Hoarseness, acute-on-chronic.  Improving gradually.  PND I don't think an ENT consult is needed at this time but he'll continue to monitor closely since he does have remote history of laryngeal cancer.  2) Bicuspid aortic valve + mitral regurg: cardiology referral ordered---will make sure this gets scheduled.  An After Visit Summary was printed and given to the patient.   FOLLOW UP: prn

## 2015-09-17 ENCOUNTER — Encounter: Payer: Self-pay | Admitting: Cardiology

## 2015-09-17 ENCOUNTER — Ambulatory Visit (INDEPENDENT_AMBULATORY_CARE_PROVIDER_SITE_OTHER): Payer: Medicare Other | Admitting: Cardiology

## 2015-09-17 VITALS — BP 122/62 | HR 77 | Ht 72.0 in | Wt 209.4 lb

## 2015-09-17 DIAGNOSIS — Z8521 Personal history of malignant neoplasm of larynx: Secondary | ICD-10-CM

## 2015-09-17 DIAGNOSIS — I77810 Thoracic aortic ectasia: Secondary | ICD-10-CM

## 2015-09-17 DIAGNOSIS — Q2381 Bicuspid aortic valve: Secondary | ICD-10-CM | POA: Insufficient documentation

## 2015-09-17 DIAGNOSIS — Q231 Congenital insufficiency of aortic valve: Secondary | ICD-10-CM

## 2015-09-17 DIAGNOSIS — E781 Pure hyperglyceridemia: Secondary | ICD-10-CM

## 2015-09-17 NOTE — Patient Instructions (Addendum)
Medication Instructions:  The current medical regimen is effective;  continue present plan and medications.  Testing/Procedures: Your physician has requested that you have cardiac CT-A. Cardiac computed tomography (CT) is a painless test that uses an x-ray machine to take clear, detailed pictures of your heart. For further information please visit [facility]. Please follow instruction sheet as given.  Your physician has requested that you have an echocardiogram in 1 year with your follow up appointment with Dr [name]. Echocardiography is a painless test that uses sound waves to create images of your heart. It provides your doctor with information about the size and shape of your heart and how well your heart's chambers and valves are working. This procedure takes approximately one hour. There are no restrictions for this procedure.   Follow-Up: Follow up in 1 year with Dr. [name].  You will receive a letter in the mail 2 months before you are due.  Please call us when you receive this letter to schedule your follow up appointment.  Thank you for choosing [facility]!!

## 2015-09-17 NOTE — Progress Notes (Signed)
Cardiology Office Note   Date:  09/17/2015   ID:  Kenneth Ryan, DOB 07/17/1946, MRN [id number]  PCP:  Tammi Sou, MD  Cardiologist:   Candee Furbish, MD       History of Present Illness: Kenneth Ryan is a 69 y.o. male who presents for evaluation of  of bicuspid aortic valve and moderate mitral regurgitation. Congestion won't go away.   Father had CABG. No valve replacement.    Past Medical History  Diagnosis Date  . Hypertriglyceridemia   . Laryngeal cancer 2002    Surgery and radiation---He had post treatment surveillance x 12 yrs and was officially "released" from f/u in summer 2014.  Marland Kitchen Chronic hoarseness     secondary to hx of laryngeal surgery  . History of tobacco abuse   . GERD (gastroesophageal reflux disease)   . Bicuspid aortic valve 07/2015  . Mitral regurgitation 07/2015    moderate    Past Surgical History  Procedure Laterality Date  . Cholecystectomy  1980  . Tonsilectomy/adenoidectomy with myringotomy  1960  . Colonoscopy  2003 and 2013    Both normal.  . Transthoracic echocardiogram  07/2015    Bicuspid aortic valve + mod mitral regurg     Current Outpatient Prescriptions  Medication Sig Dispense Refill  . aspirin 81 MG tablet Take 81 mg by mouth daily.    . fenofibrate (TRICOR) 145 MG tablet Take 1 tablet (145 mg total) by mouth daily. 90 tablet 1  . Multiple Vitamins-Minerals (MULTIVITAMIN PO) Take 1 tablet by mouth daily.     . pantoprazole (PROTONIX) 40 MG tablet TAKE 1 TABLET BY MOUTH EVERY DAY 30 tablet 3   No current facility-administered medications for this visit.    Allergies:   Review of patient's allergies indicates no known allergies.    Social History:  The patient  reports that he quit smoking about 36 years ago. He has never used smokeless tobacco. He reports that he drinks alcohol. He reports that he does not use illicit drugs.   Family History:  The patient's family history includes Cancer in his mother; Heart  disease in his father.    ROS:  Please see the history of present illness.   Otherwise, review of systems are positive for chronic congestion, hoarseness.   All other systems are reviewed and negative.    PHYSICAL EXAM: VS:  BP 122/62 mmHg  Pulse 77  Ht 6' (1.829 m)  Wt 209 lb 6.4 oz (94.983 kg)  BMI 28.39 kg/m2 , BMI Body mass index is 28.39 kg/(m^2). GEN: Well nourished, well developed, in no acute distress HEENT: normal Neck: no JVD, positive carotid bruits, likely radiation of aortic murmur, or masses, abrupt upstroke Cardiac: RRR; 3/6 systolic mid peaking crescendo decrescendo murmur, no rubs, or gallops,no edema  Respiratory:  clear to auscultation bilaterally, normal work of breathing GI: soft, nontender, nondistended, + BS MS: no deformity or atrophy Skin: warm and dry, no rash Neuro:  Strength and sensation are intact Psych: euthymic mood, full affect   EKG:  Today's EKG 09/17/15-sinus rhythm, 77, no other abnormalities.  Echo: 08/16/15 - Left ventricle: The cavity size was mildly dilated. Wall thickness was normal. Systolic function was normal. The estimated ejection fraction was in the range of 55% to 60%. - Aortic valve: Functionally bicuspid; severely calcified leaflets. There was mild stenosis. There was mild regurgitation. Valve area (VTI): 2.02 cm^2. Valve area (Vmax): 1.68 cm^2. Valve area (Vmean): 1.75 cm^2. - Aorta: Moderately dilated Given bicuspid  appearing valve consider cardiac CT to further size aorta and look at valve - Mitral valve: Calcified annulus. Mildly thickened leaflets . There was moderate regurgitation. Valve area by continuity equation (using LVOT flow): 2.44 cm^2. - Left atrium: The atrium was mildly to moderately dilated. - Atrial septum: No defect or patent foramen ovale was identified.   Recent Labs: 08/13/2015: ALT 17; BUN 12; Creatinine, Ser 0.83; Hemoglobin 14.4; Platelets 176.0; Potassium 4.0; Sodium 142    Lipid  Panel    Component Value Date/Time   CHOL 162 08/13/2015 0839   TRIG 87.0 08/13/2015 0839   HDL 43.30 08/13/2015 0839   CHOLHDL 4 08/13/2015 0839   VLDL 17.4 08/13/2015 0839   LDLCALC 101* 08/13/2015 0839      Wt Readings from Last 3 Encounters:  09/17/15 209 lb 6.4 oz (94.983 kg)  08/28/15 211 lb (95.709 kg)  08/13/15 210 lb (95.255 kg)      Other studies Reviewed: Additional studies/ records that were reviewed today include: Office note, EKG, lab work. Review of the above records demonstrates: As above   ASSESSMENT AND PLAN:  1.  Bicuspid aortic valve-moderate aortic valve stenosis at this point but severely calcified. Congenital abnormality. His father did not have valve replacement. He has had a lifelong murmur. Does not seem to have any symptoms were regarding this condition. We will continue to monitor closely. Echo in one year. We will check a cardiac gated CT scan-Dr. Johnsie Cancel, to further evaluate aortic valve as well as aortic root. He knows to contact me if signs or symptoms develop such as shortness of breath or anginal discomfort. No syncope. Continue healthy lifestyle. Exercise. If symptoms do develop, we will have low threshold for stress test as well.  2. Dilated aortic root-avoid extreme lifting exercises/power lifting. Blood pressure is under good control. If blood pressure elevates, I would add beta blocker to reduce shear stress. Currently 46 mm. Checking gated cardiac CT to evaluate aortic root as well. We will monitor with echo as well on a yearly basis.  3. Hypertriglyceridemia-baseline triglycerides in the 400 range. Excellent response to fibrates. Continue.  4. History of laryngeal cancer-early 2000's. If congestion does not improve, consider further ENT evaluation as directed by Dr. Ernestine Conrad, his primary physician.   Current medicines are reviewed at length with the patient today.  The patient does not have concerns regarding medicines.  The following  changes have been made:  no change  Labs/ tests ordered today include:   Orders Placed This Encounter  Procedures  . CT ANGIO CHEST AORTA W/CM &/OR WO/CM  . EKG 12-Lead  . Echocardiogram     Disposition:   FU with Skains in 1 year. We will discuss results of testing however prior to this.  Bobby Rumpf, MD  09/17/2015 9:15 AM    [address], [place], [place]  [address] Phone: [phone]; Fax: [phone]

## 2015-09-17 NOTE — Addendum Note (Signed)
Addended by: Shellia Cleverly on: 09/17/2015 02:16 PM   Modules accepted: Orders

## 2015-09-18 ENCOUNTER — Other Ambulatory Visit (INDEPENDENT_AMBULATORY_CARE_PROVIDER_SITE_OTHER): Payer: Medicare Other | Admitting: *Deleted

## 2015-09-18 ENCOUNTER — Encounter: Payer: Self-pay | Admitting: Cardiology

## 2015-09-18 ENCOUNTER — Other Ambulatory Visit: Payer: Self-pay | Admitting: *Deleted

## 2015-09-18 DIAGNOSIS — Z01812 Encounter for preprocedural laboratory examination: Secondary | ICD-10-CM

## 2015-09-18 DIAGNOSIS — I77810 Thoracic aortic ectasia: Secondary | ICD-10-CM

## 2015-09-18 LAB — BASIC METABOLIC PANEL
BUN: 12 mg/dL (ref 6–23)
CALCIUM: 9.7 mg/dL (ref 8.4–10.5)
CO2: 31 meq/L (ref 19–32)
Chloride: 105 mEq/L (ref 96–112)
Creatinine, Ser: 0.89 mg/dL (ref 0.40–1.50)
GFR: 90.53 mL/min (ref >60.00)
Glucose, Bld: 92 mg/dL (ref 70–99)
Potassium: 4.3 mEq/L (ref 3.5–5.1)
SODIUM: 139 meq/L (ref 135–145)

## 2015-09-19 ENCOUNTER — Encounter: Payer: Self-pay | Admitting: Family Medicine

## 2015-09-19 ENCOUNTER — Ambulatory Visit (INDEPENDENT_AMBULATORY_CARE_PROVIDER_SITE_OTHER): Payer: Medicare Other | Admitting: Family Medicine

## 2015-09-19 VITALS — BP 121/72 | HR 76 | Temp 97.9°F | Resp 16 | Ht 72.0 in | Wt 208.0 lb

## 2015-09-19 DIAGNOSIS — Z125 Encounter for screening for malignant neoplasm of prostate: Secondary | ICD-10-CM

## 2015-09-19 DIAGNOSIS — R490 Dysphonia: Secondary | ICD-10-CM | POA: Diagnosis not present

## 2015-09-19 DIAGNOSIS — Z8521 Personal history of malignant neoplasm of larynx: Secondary | ICD-10-CM | POA: Diagnosis not present

## 2015-09-19 DIAGNOSIS — Z0000 Encounter for general adult medical examination without abnormal findings: Secondary | ICD-10-CM

## 2015-09-19 DIAGNOSIS — H547 Unspecified visual loss: Secondary | ICD-10-CM

## 2015-09-19 LAB — PSA, MEDICARE: PSA: 2.59 ng/ml (ref 0.10–4.00)

## 2015-09-19 NOTE — Progress Notes (Signed)
Office Note 09/19/2015  CC:  Chief Complaint  Patient presents with  . Annual Exam    Pt is fasting.     HPI:  Kenneth Ryan is a 69 y.o. White male who is here for fasting CPE. Saw cardiologist 2 d/a for recently dx'd bicuspid aortic valve with mildly dilated aortic root. Plan is for annual echo to monitor, also he'll be getting a CT soon to further assess his aortic root.  Still with significant secretions in back of throat and significant hoarseness above his baseline chronic hoarseness.  He is definitely ready to see an ENT for further evaluation given his past history of laryngeal cancer.  Hasn't had eye exam in several years, doesn't have a local eye doctor.    Past Medical History  Diagnosis Date  . Hypertriglyceridemia   . Laryngeal cancer 04/01/2001    Surgery and radiation---He had post treatment surveillance x 12 yrs and was officially "released" from f/u in summer 2014.  Marland Kitchen Chronic hoarseness     secondary to hx of laryngeal surgery  . History of tobacco abuse   . GERD (gastroesophageal reflux disease)   . Bicuspid aortic valve 07/2015  . Mitral regurgitation 07/2015    moderate    Past Surgical History  Procedure Laterality Date  . Cholecystectomy  1980  . Tonsilectomy/adenoidectomy with myringotomy  1960  . Colonoscopy  2002-04-01 and 04-01-2012    Both normal.  . Transthoracic echocardiogram  07/2015    Bicuspid aortic valve + mod mitral regurg    Family History  Problem Relation Age of Onset  . Heart disease Father   . Cancer Mother     breast    Social History   Social History  . Marital Status: Married    Spouse Name: N/A  . Number of Children: N/A  . Years of Education: N/A   Occupational History  . Not on file.   Social History Main Topics  . Smoking status: Former Smoker    Quit date: 07/22/1979  . Smokeless tobacco: Never Used  . Alcohol Use: Yes  . Drug Use: No  . Sexual Activity: Not on file   Other Topics Concern  . Not on file    Social History Narrative   Married, one son deceased 04-02-2011.   Relocated from [place] to [facility] 06/2013 to be near grandchildren.   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   Smoker 20 pack-yr hx: quit about 1980.   Alcohol: 3 bottles of beer per week avg.     Drugs: none             Outpatient Prescriptions Prior to Visit  Medication Sig Dispense Refill  . aspirin 81 MG tablet Take 81 mg by mouth daily.    . fenofibrate (TRICOR) 145 MG tablet Take 1 tablet (145 mg total) by mouth daily. 90 tablet 1  . Multiple Vitamins-Minerals (MULTIVITAMIN PO) Take 1 tablet by mouth daily.     . pantoprazole (PROTONIX) 40 MG tablet TAKE 1 TABLET BY MOUTH EVERY DAY 30 tablet 3   No facility-administered medications prior to visit.    No Known Allergies  ROS Review of Systems  Constitutional: Negative for fever, chills, appetite change and fatigue.  HENT: Negative for congestion, dental problem, ear pain and sore throat.   Eyes: Negative for discharge, redness and visual disturbance.  Respiratory: Negative for cough, chest tightness, shortness of breath and wheezing.   Cardiovascular: Negative for chest pain, palpitations and leg swelling.  Gastrointestinal: Negative for nausea,  vomiting, abdominal pain, diarrhea and blood in stool.  Genitourinary: Negative for dysuria, urgency, frequency, hematuria, flank pain and difficulty urinating.  Musculoskeletal: Negative for myalgias, back pain, joint swelling, arthralgias and neck stiffness.  Skin: Negative for pallor and rash.  Neurological: Negative for dizziness, speech difficulty, weakness and headaches.  Hematological: Negative for adenopathy. Does not bruise/bleed easily.  Psychiatric/Behavioral: Negative for confusion and sleep disturbance. The patient is not nervous/anxious.     PE; Blood pressure 121/72, pulse 76, temperature 97.9 F (36.6 C), temperature source Oral, resp. rate 16, height 6' (1.829 m), weight 208 lb (94.348 kg), SpO2 95 %. Gen:  Alert, well appearing.  Patient is oriented to person, place, time, and situation. AFFECT: pleasant, lucid thought and speech. ENT: Ears: EACs clear, normal epithelium.  TMs with good light reflex and landmarks bilaterally.  Eyes: no injection, icteris, swelling, or exudate.  EOMI, PERRLA. Nose: no drainage or turbinate edema/swelling.  No injection or focal lesion.  Mouth: lips without lesion/swelling.  Oral mucosa pink and moist.  Dentition intact and without obvious caries or gingival swelling.  Oropharynx without erythema, exudate, or swelling.  Neck: supple/nontender.  No LAD, mass, or TM.  Carotid pulses 2+ bilaterally, without bruits. CV: RRR, no m/r/g.   LUNGS: CTA bilat, nonlabored resps, good aeration in all lung fields. ABD: soft, NT, ND, BS normal.  No hepatospenomegaly or mass.  No bruits. EXT: no clubbing, cyanosis, or edema.  Musculoskeletal: no joint swelling, erythema, warmth, or tenderness.  ROM of all joints intact. Skin - no sores or suspicious lesions or rashes or color changes Rectal exam: negative without mass, lesions or tenderness, PROSTATE EXAM: smooth and symmetric without nodules or tenderness.  Pertinent labs:  Lab Results  Component Value Date   TSH 3.53 01/22/2014   Lab Results  Component Value Date   WBC 7.9 08/13/2015   HGB 14.4 08/13/2015   HCT 42.9 08/13/2015   MCV 85.5 08/13/2015   PLT 176.0 08/13/2015   Lab Results  Component Value Date   CREATININE 0.89 09/18/2015   BUN 12 09/18/2015   NA 139 09/18/2015   K 4.3 09/18/2015   CL 105 09/18/2015   CO2 31 09/18/2015   Lab Results  Component Value Date   ALT 17 08/13/2015   AST 19 08/13/2015   ALKPHOS 46 08/13/2015   BILITOT 0.9 08/13/2015   Lab Results  Component Value Date   CHOL 162 08/13/2015   Lab Results  Component Value Date   HDL 43.30 08/13/2015   Lab Results  Component Value Date   LDLCALC 101* 08/13/2015   Lab Results  Component Value Date   TRIG 87.0 08/13/2015    Lab Results  Component Value Date   CHOLHDL 4 08/13/2015   Lab Results  Component Value Date   PSA 2.16 01/22/2014     ASSESSMENT AND PLAN:   1) Health maintenance exam: Reviewed age and gender appropriate health maintenance issues (prudent diet, regular exercise, health risks of tobacco and excessive alcohol, use of seatbelts, fire alarms in home, use of sunscreen).  Also reviewed age and gender appropriate health screening as well as vaccine recommendations. DRE normal, PSA drawn today.  Colon ca screening UTD.  Vaccines UTD. All other health panel labs UTD--excellent. Refer to Dr. Nicki Reaper, optometrist, for general eye exam.  2) Hoarseness: refer to ENT for further evaluation (hx of laryngeal cancer).  An After Visit Summary was printed and given to the patient.  FOLLOW UP:  Return in about 1  year (around 09/18/2016) for annual CPE (fasting).

## 2015-09-19 NOTE — Progress Notes (Signed)
Pre visit review using our clinic review tool, if applicable. No additional management support is needed unless otherwise documented below in the visit note. 

## 2015-09-21 HISTORY — PX: OTHER SURGICAL HISTORY: SHX169

## 2015-09-23 ENCOUNTER — Ambulatory Visit (HOSPITAL_COMMUNITY)
Admission: RE | Admit: 2015-09-23 | Discharge: 2015-09-23 | Disposition: A | Discharge status: Home / Self Care | Payer: Medicare Other | Source: Ambulatory Visit | Attending: Cardiology | Admitting: Cardiology

## 2015-09-23 ENCOUNTER — Encounter (HOSPITAL_COMMUNITY): Payer: Self-pay

## 2015-09-23 ENCOUNTER — Ambulatory Visit (HOSPITAL_COMMUNITY): Payer: Medicare Other

## 2015-09-23 DIAGNOSIS — Q231 Congenital insufficiency of aortic valve: Secondary | ICD-10-CM | POA: Diagnosis not present

## 2015-09-23 DIAGNOSIS — I77810 Thoracic aortic ectasia: Secondary | ICD-10-CM | POA: Insufficient documentation

## 2015-09-23 MED ORDER — NITROGLYCERIN 0.4 MG SL SUBL
0.4000 mg | SUBLINGUAL_TABLET | Freq: Once | SUBLINGUAL | Status: AC
  Administered 2015-09-23: 0.4 mg via SUBLINGUAL
  Filled 2015-09-23: qty 25

## 2015-09-23 MED ORDER — METOPROLOL TARTRATE 1 MG/ML IV SOLN
5.0000 mg | INTRAVENOUS | Status: DC | PRN
  Administered 2015-09-23: 5 mg via INTRAVENOUS
  Filled 2015-09-23 (×2): qty 5

## 2015-09-23 MED ORDER — NITROGLYCERIN 0.4 MG SL SUBL
SUBLINGUAL_TABLET | SUBLINGUAL | Status: AC
  Administered 2015-09-23: 0.4 mg via SUBLINGUAL
  Filled 2015-09-23: qty 1

## 2015-09-23 MED ORDER — METOPROLOL TARTRATE 1 MG/ML IV SOLN
INTRAVENOUS | Status: AC
  Filled 2015-09-23: qty 5

## 2015-09-23 MED ORDER — IOHEXOL 350 MG/ML SOLN
80.0000 mL | Freq: Once | INTRAVENOUS | Status: AC | PRN
  Administered 2015-09-23: 80 mL via INTRAVENOUS

## 2015-09-23 MED ORDER — METOPROLOL TARTRATE 1 MG/ML IV SOLN
INTRAVENOUS | Status: AC
  Administered 2015-09-23: 5 mg via INTRAVENOUS
  Filled 2015-09-23: qty 5

## 2015-09-23 MED ORDER — METOPROLOL TARTRATE 1 MG/ML IV SOLN
5.0000 mg | Freq: Once | INTRAVENOUS | Status: AC
  Administered 2015-09-23: 5 mg via INTRAVENOUS
  Filled 2015-09-23: qty 5

## 2015-10-02 ENCOUNTER — Telehealth: Payer: Self-pay | Admitting: Cardiology

## 2015-10-02 NOTE — Telephone Encounter (Signed)
New Message:  Kenneth Ryan called in stating that Dr. Jodene Nam wanted to speak with one of Dr. Kingsley Plan partners about this pt. He wants this pt to have surgery but wanted to discuss a few things prior to scheduling the surgery. Please f/u with him   Thanks

## 2015-10-02 NOTE — Telephone Encounter (Signed)
Sent call to DOD Dr. Lovena Le.

## 2015-10-02 NOTE — Telephone Encounter (Signed)
Dr Lovena Le spoke with MD and advised soul not clear over the phone.  Would like for Dr [name] to clear upon his return.

## 2015-10-07 NOTE — Telephone Encounter (Signed)
Please have him come in to discuss cardiac risks prior to surgery.  Candee Furbish, MD

## 2015-10-07 NOTE — Telephone Encounter (Signed)
Called to schedule appt for pt to come in to discuss cardiac-surgical risk.  Pt states this surgery has to be done regardless and he knows all surgeries have risks.  Pt does not feel it necessary to come in for an appt at this time.

## 2015-10-10 ENCOUNTER — Other Ambulatory Visit: Payer: Self-pay | Admitting: Otolaryngology

## 2015-10-10 DIAGNOSIS — E049 Nontoxic goiter, unspecified: Secondary | ICD-10-CM

## 2015-10-14 ENCOUNTER — Other Ambulatory Visit: Payer: Self-pay | Admitting: Otolaryngology

## 2015-10-14 MED ORDER — PANTOPRAZOLE SODIUM 40 MG IV SOLR
40.0000 mg | Freq: Once | INTRAVENOUS | Status: AC
  Administered 2015-10-17: 40 mg via INTRAVENOUS

## 2015-10-14 NOTE — H&P (Signed)
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  His most recent evaluation was 2014 and he was given a clean bill of health.  He stopped smoking in 1990.  For the past 3 months, he has some new onset hoarseness and also what feels like some loose phlegm in his chest although he cannot produce it.  No blood.  He does not drink significantly.  No pain.  No breathing or swallowing issues.  He does have known reflux but seems well-controlled with one Protonix each morning.  He has been doing this roughly 6 months.  On specific questioning, most of his symptoms are nocturnal.  No neck masses.  No change in weight, appetite, or energy.  I discussed his case with Dr. Crissie Sickles in Dr. Kingsley Plan absence.  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  this explains the audible murmur.  he also has some mitral regurgitation.  Dr. Lovena Le  thought he probably needed antibiotic prophylaxis for surgery.  schedule him next week if possible.  I will talk with Dr. [name] before surgery.  Patient would prefer not to have preoperative cardiac clearance per Dr. [name]. We  can go ahead and schedule the surgery, but it is possible that anesthesia will cancel.    PMH: Past Medical History  Diagnosis Date  . Hypertriglyceridemia   . Laryngeal cancer [facility]) 2002    Surgery and radiation---He had post treatment surveillance x 12 yrs and was officially "released" from f/u in summer 2014.  Marland Kitchen Chronic hoarseness     secondary to hx of laryngeal surgery  . History of tobacco abuse   . GERD (gastroesophageal reflux disease)   . Bicuspid aortic valve 07/2015  . Mitral regurgitation 07/2015    moderate    Surg Hx: Past Surgical History  Procedure Laterality Date  . Cholecystectomy  1980  . Tonsilectomy/adenoidectomy with myringotomy  1960  . Colonoscopy   2003 and 2013    Both normal.  . Transthoracic echocardiogram  07/2015    Bicuspid aortic valve + mod mitral regurg    FHx:   Family History  Problem Relation Age of Onset  . Heart disease Father   . Cancer Mother     breast   SocHx:  reports that he quit smoking about 36 years ago. He has never used smokeless tobacco. He reports that he drinks alcohol. He reports that he does not use illicit drugs.  ALLERGIES: No Known Allergies   (Not in a hospital admission)  No results found for this or any previous visit (from the past 48 hour(s)). No results found.  EXN:[id number]: Feeling tired (fatigue).  No fever, no night sweats, and no recent weight loss. Head: No headache. Eyes: No eye symptoms. Otolaryngeal: No hearing loss, no earache, no tinnitus, and no purulent nasal discharge.  No nasal passage blockage (stuffiness), no snoring, and no sneezing.  Hoarseness.  No sore throat. Cardiovascular: No chest pain or discomfort  and no palpitations. Pulmonary: No dyspnea.  Cough  and wheezing. Gastrointestinal: No dysphagia  and no heartburn.  No nausea, no abdominal pain, and no melena.  No diarrhea. Genitourinary: No dysuria. Endocrine: No muscle weakness. Musculoskeletal: No calf muscle cramps, no arthralgias, and no soft tissue swelling. Neurological: No dizziness, no fainting, no tingling, and no numbness. Psychological: No anxiety  and no depression.  Skin: No rash.  BP:127/67,  HR: 89 b/min,  BSA Calculated: 2.17 ,  BMI Calculated: 28.35 ,  Weight: 209 lb , BMI: 28.3 kg/m2,  Height: 6 ft.  PHYSICAL EXAM: He is somewhat overweight.  He is rather hoarse not diplophonic mental status is appropriate.  He hears well in conversational speech.  Voice is clear and respirations unlabored through the nose.  The head is atraumatic adnexa all.  Cranial nerves intact.  Ear canals are waxy but not excluded.  Anterior nose is moist with a leftward septal deviation and maxillary crest spur.   Oral cavity is clear with remaining teeth in good repair.  Oropharynx clear.  Neck is muscular but the thyroid gland feels somewhat enlarged.  No discrete adenopathy.   Using the flexible laryngoscope through the nose, nasopharynx shows adenoidectomy scarring with normal eustachian tori.  Oropharynx is clear.  Hypopharynx shows mobile vocal cords.  There is a 2-3 mm  red pedunculated cervical lesion hanging off of the false vocal cord anteriorly on the LEFT.  It may be attached to or simply pressing upon the true vocal cord.  Airway is good.  No pooling in valleculae or pyriforms.   Lungs: Clear to auscultation Heart: 3/6 systolic ejection murmur.  Regular rate and rhythm. Abdomen: Obese, active Extremities: Normal configuration Neurologic: Symmetric, grossly intact.   Assessment/Plan Gastroesophageal reflux disease, esophagitis presence not specified (530.81) (K21.9). 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  You have a small growth comprised of microscopic blood vessels called a granuloma on your LEFT false vocal cord, just above the real vocal cord.  I think this should be removed so that we can make sure it is not cancer although I do not think it is.  This may also help with your hoarseness.  I will talk with your cardiologist before we embark on surgery regarding your dilated aorta and heart murmur.   I think your Protonix might work better if you take it each evening before dinner.   Your thyroid gland  feels somewhat enlarged.  We will check an ultrasound.  Hydrocodone-Acetaminophen 7.5-325 MG/15ML Oral Solution;10-20 ml po q4-6h prn pain; [id number]; R0; Rx.  Jodi Marble 26/83/4196, 6:34 PM

## 2015-10-16 ENCOUNTER — Encounter (HOSPITAL_COMMUNITY): Payer: Self-pay

## 2015-10-16 ENCOUNTER — Encounter (HOSPITAL_COMMUNITY)
Admission: RE | Admit: 2015-10-16 | Discharge: 2015-10-16 | Disposition: A | Discharge status: Home / Self Care | Payer: Medicare Other | Source: Ambulatory Visit | Attending: Otolaryngology | Admitting: Otolaryngology

## 2015-10-16 ENCOUNTER — Other Ambulatory Visit: Payer: Self-pay | Admitting: Family Medicine

## 2015-10-16 DIAGNOSIS — Z8521 Personal history of malignant neoplasm of larynx: Secondary | ICD-10-CM | POA: Diagnosis not present

## 2015-10-16 DIAGNOSIS — K219 Gastro-esophageal reflux disease without esophagitis: Secondary | ICD-10-CM | POA: Diagnosis not present

## 2015-10-16 DIAGNOSIS — R490 Dysphonia: Secondary | ICD-10-CM | POA: Diagnosis not present

## 2015-10-16 DIAGNOSIS — E663 Overweight: Secondary | ICD-10-CM | POA: Diagnosis not present

## 2015-10-16 DIAGNOSIS — J383 Other diseases of vocal cords: Secondary | ICD-10-CM | POA: Diagnosis present

## 2015-10-16 DIAGNOSIS — I739 Peripheral vascular disease, unspecified: Secondary | ICD-10-CM | POA: Diagnosis not present

## 2015-10-16 DIAGNOSIS — E049 Nontoxic goiter, unspecified: Secondary | ICD-10-CM | POA: Diagnosis not present

## 2015-10-16 DIAGNOSIS — Z6828 Body mass index (BMI) 28.0-28.9, adult: Secondary | ICD-10-CM | POA: Diagnosis not present

## 2015-10-16 DIAGNOSIS — E781 Pure hyperglyceridemia: Secondary | ICD-10-CM | POA: Diagnosis not present

## 2015-10-16 DIAGNOSIS — Z87891 Personal history of nicotine dependence: Secondary | ICD-10-CM | POA: Diagnosis not present

## 2015-10-16 HISTORY — DX: Headache: R51

## 2015-10-16 HISTORY — DX: Headache, unspecified: R51.9

## 2015-10-16 HISTORY — DX: Other diseases of vocal cords: J38.3

## 2015-10-16 HISTORY — DX: Cardiac murmur, unspecified: R01.1

## 2015-10-16 LAB — BASIC METABOLIC PANEL
ANION GAP: 10 (ref 5–15)
BUN: 14 mg/dL (ref 6–20)
CALCIUM: 9.7 mg/dL (ref 8.9–10.3)
CO2: 23 mmol/L (ref 22–32)
Chloride: 108 mmol/L (ref 101–111)
Creatinine, Ser: 0.88 mg/dL (ref 0.61–1.24)
GLUCOSE: 111 mg/dL — AB (ref 65–99)
Potassium: 4.1 mmol/L (ref 3.5–5.1)
SODIUM: 141 mmol/L (ref 135–145)

## 2015-10-16 LAB — CBC
HEMATOCRIT: 43.5 % (ref 39.0–52.0)
HEMOGLOBIN: 14.5 g/dL (ref 13.0–17.0)
MCH: 28.5 pg (ref 26.0–34.0)
MCHC: 33.3 g/dL (ref 30.0–36.0)
MCV: 85.6 fL (ref 78.0–100.0)
Platelets: 181 10*3/uL (ref 150–400)
RBC: 5.08 MIL/uL (ref 4.22–5.81)
RDW: 13.8 % (ref 11.5–15.5)
WBC: 5.6 10*3/uL (ref 4.0–10.5)

## 2015-10-16 MED ORDER — SODIUM CHLORIDE 0.9 % IV SOLN
2.0000 g | INTRAVENOUS | Status: AC
  Administered 2015-10-17: 2 g via INTRAVENOUS
  Filled 2015-10-16: qty 2000

## 2015-10-16 NOTE — Progress Notes (Signed)
Pt denies SOB and chest pain but is under the care of Dr. [name], cardiology. Pt denies having a stress test and cardiac cath.  Pt stated that he took his last dose of Aspirin last night. Spoke with Amy, assistant, at Dr. Noreene Filbert  office regarding pre-op Aspirin instructions. According to Amy, pt is to stop Aspirin now. Pt made aware and verbalized understanding. Ebony Hail, PA, anesthesia, reviewed pt cardiac history; see note.

## 2015-10-16 NOTE — Telephone Encounter (Signed)
RF request for fenofibrate LOV: 09/19/15 Next ov: 09/18/16 Last written: 04/15/15 #90 w/ 1RF

## 2015-10-16 NOTE — Progress Notes (Signed)
Anesthesia Chart Review: Patient is a 69 year old male scheduled for micro direct laryngoscopy laser and frozen section tomorrow by Dr. Erik Obey. DX: Left false vocal cord granuloma.   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  PCP is Dr. Anitra Lauth. Cardiologist is Dr. [name].  According to Dr. Noreene Filbert H&P: "I discussed his case with Dr. Crissie Sickles in Dr. Kingsley Plan absence. 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 this explains the audible murmur. he also has some mitral regurgitation. Dr. Lovena Le thought he probably needed antibiotic prophylaxis for surgery. schedule him next week if possible. I will talk with Dr. [name] before surgery."  Meds include ASA 81mg , Tricor, Protonix.  09/17/15 EKG: NSR.  08/19/15 Echo: Study Conclusions - Left ventricle: The cavity size was mildly dilated. Wall thickness was normal. Systolic function was normal. The estimated ejection fraction was in the range of 55% to 60%. - Aortic valve: Functionally bicuspid; severely calcified leaflets. There was mild stenosis. There was mild regurgitation. Valve area (VTI): 2.02 cm^2. Valve area (Vmax): 1.68 cm^2. Valve area (Vmean): 1.75 cm^2. - Aorta: Moderately dilated Given bicuspid appearing valve consider cardiac CT to further size aorta and look at valve - Mitral valve: Calcified annulus. Mildly thickened leaflets . There was moderate regurgitation. Valve area by continuity equation (using LVOT flow): 2.44 cm^2. - Left atrium: The atrium was mildly to moderately dilated. - Atrial septum: No defect or  patent foramen ovale was identified.  09/23/15 Cardiac CT: IMPRESSION: 1) Calcium Score 201 which is 63rd percentile for age and sex matched controls 2) No obstructive CAD. Right dominant. Less than 30% calcified disease in proximal LAD and RCA 3) Moderate to severe Ascending aortic root enlargement 48 mm. 4) Dilated non coronary of Valsalva 42 mm 5) Normal origin of the great vessels with no coarctation. 6) Aortic valve calcified but not stenotic Appears trileaflet with partial fusion of the left and right coronary cusps  Preoperative labs noted.  Dr. Erik Obey did get cardiology input. If no acute changes then I would anticipate that he could proceed as planned.  George Hugh [facility]Anesthesiology Phone [phone] 10/16/2015 1:57 PM

## 2015-10-17 ENCOUNTER — Ambulatory Visit (HOSPITAL_COMMUNITY)
Admission: RE | Admit: 2015-10-17 | Discharge: 2015-10-17 | Disposition: A | Discharge status: Home / Self Care | Payer: Medicare Other | Source: Ambulatory Visit | Attending: Otolaryngology | Admitting: Otolaryngology

## 2015-10-17 ENCOUNTER — Ambulatory Visit (HOSPITAL_COMMUNITY): Payer: Medicare Other | Admitting: Vascular Surgery

## 2015-10-17 ENCOUNTER — Encounter (HOSPITAL_COMMUNITY)
Admission: RE | Disposition: A | Discharge status: Home / Self Care | Payer: Self-pay | Source: Ambulatory Visit | Attending: Otolaryngology

## 2015-10-17 ENCOUNTER — Ambulatory Visit (HOSPITAL_COMMUNITY): Payer: Medicare Other | Admitting: Certified Registered"

## 2015-10-17 ENCOUNTER — Encounter (HOSPITAL_COMMUNITY): Payer: Self-pay | Admitting: Surgery

## 2015-10-17 DIAGNOSIS — E781 Pure hyperglyceridemia: Secondary | ICD-10-CM | POA: Diagnosis not present

## 2015-10-17 DIAGNOSIS — R490 Dysphonia: Secondary | ICD-10-CM | POA: Insufficient documentation

## 2015-10-17 DIAGNOSIS — J383 Other diseases of vocal cords: Secondary | ICD-10-CM | POA: Diagnosis not present

## 2015-10-17 DIAGNOSIS — K219 Gastro-esophageal reflux disease without esophagitis: Secondary | ICD-10-CM | POA: Diagnosis not present

## 2015-10-17 DIAGNOSIS — I739 Peripheral vascular disease, unspecified: Secondary | ICD-10-CM | POA: Insufficient documentation

## 2015-10-17 DIAGNOSIS — Z8521 Personal history of malignant neoplasm of larynx: Secondary | ICD-10-CM | POA: Insufficient documentation

## 2015-10-17 DIAGNOSIS — Z6828 Body mass index (BMI) 28.0-28.9, adult: Secondary | ICD-10-CM | POA: Insufficient documentation

## 2015-10-17 DIAGNOSIS — E049 Nontoxic goiter, unspecified: Secondary | ICD-10-CM | POA: Insufficient documentation

## 2015-10-17 DIAGNOSIS — Z87891 Personal history of nicotine dependence: Secondary | ICD-10-CM | POA: Insufficient documentation

## 2015-10-17 DIAGNOSIS — E663 Overweight: Secondary | ICD-10-CM | POA: Insufficient documentation

## 2015-10-17 HISTORY — PX: DIRECT LARYNGOSCOPY: SHX5326

## 2015-10-17 SURGERY — LARYNGOSCOPY, DIRECT
Anesthesia: General | Site: Throat | Laterality: Left

## 2015-10-17 MED ORDER — ROCURONIUM BROMIDE 100 MG/10ML IV SOLN
INTRAVENOUS | Status: DC | PRN
  Administered 2015-10-17: 50 mg via INTRAVENOUS

## 2015-10-17 MED ORDER — ONDANSETRON HCL 4 MG/2ML IJ SOLN
INTRAMUSCULAR | Status: DC | PRN
  Administered 2015-10-17: 4 mg via INTRAVENOUS

## 2015-10-17 MED ORDER — DEXTROSE-NACL 5-0.45 % IV SOLN: INTRAVENOUS | Status: DC

## 2015-10-17 MED ORDER — FENTANYL CITRATE (PF) 250 MCG/5ML IJ SOLN
INTRAMUSCULAR | Status: AC
  Filled 2015-10-17: qty 5

## 2015-10-17 MED ORDER — LACTATED RINGERS IV SOLN
INTRAVENOUS | Status: DC | PRN
  Administered 2015-10-17: 07:00:00 via INTRAVENOUS

## 2015-10-17 MED ORDER — ARTIFICIAL TEARS OP OINT
TOPICAL_OINTMENT | OPHTHALMIC | Status: DC | PRN
  Administered 2015-10-17: 1 via OPHTHALMIC

## 2015-10-17 MED ORDER — IBUPROFEN 100 MG/5ML PO SUSP: 400.0000 mg | Freq: Four times a day (QID) | ORAL | Status: DC | PRN

## 2015-10-17 MED ORDER — LIDOCAINE HCL (CARDIAC) 20 MG/ML IV SOLN
INTRAVENOUS | Status: DC | PRN
  Administered 2015-10-17: 70 mg via INTRAVENOUS

## 2015-10-17 MED ORDER — FENTANYL CITRATE (PF) 100 MCG/2ML IJ SOLN: 25.0000 ug | INTRAMUSCULAR | Status: DC | PRN

## 2015-10-17 MED ORDER — EPINEPHRINE HCL (NASAL) 0.1 % NA SOLN
NASAL | Status: AC
Start: 2015-10-17 — End: ?
  Filled 2015-10-17: qty 30

## 2015-10-17 MED ORDER — PROPOFOL 10 MG/ML IV BOLUS
INTRAVENOUS | Status: DC | PRN
  Administered 2015-10-17: 160 mg via INTRAVENOUS

## 2015-10-17 MED ORDER — FENTANYL CITRATE (PF) 100 MCG/2ML IJ SOLN
INTRAMUSCULAR | Status: DC | PRN
  Administered 2015-10-17 (×2): 50 ug via INTRAVENOUS
  Administered 2015-10-17: 100 ug via INTRAVENOUS

## 2015-10-17 MED ORDER — 0.9 % SODIUM CHLORIDE (POUR BTL) OPTIME
TOPICAL | Status: DC | PRN
  Administered 2015-10-17: 1000 mL

## 2015-10-17 MED ORDER — NEOSTIGMINE METHYLSULFATE 10 MG/10ML IV SOLN
INTRAVENOUS | Status: DC | PRN
  Administered 2015-10-17: 4 mg via INTRAVENOUS

## 2015-10-17 MED ORDER — ONDANSETRON HCL 4 MG PO TABS: 4.0000 mg | ORAL_TABLET | ORAL | Status: DC | PRN

## 2015-10-17 MED ORDER — MIDAZOLAM HCL 2 MG/2ML IJ SOLN
INTRAMUSCULAR | Status: AC
  Filled 2015-10-17: qty 4

## 2015-10-17 MED ORDER — GLYCOPYRROLATE 0.2 MG/ML IJ SOLN
INTRAMUSCULAR | Status: DC | PRN
  Administered 2015-10-17: 0.6 mg via INTRAVENOUS

## 2015-10-17 MED ORDER — PROPOFOL 10 MG/ML IV BOLUS
INTRAVENOUS | Status: AC
  Filled 2015-10-17: qty 20

## 2015-10-17 MED ORDER — PANTOPRAZOLE SODIUM 40 MG IV SOLR
40.0000 mg | Freq: Once | INTRAVENOUS | Status: DC
  Filled 2015-10-17: qty 40

## 2015-10-17 MED ORDER — HYDROCODONE-ACETAMINOPHEN 7.5-325 MG/15ML PO SOLN: 10.0000 mL | ORAL | Status: DC | PRN

## 2015-10-17 MED ORDER — TRIAMCINOLONE ACETONIDE 40 MG/ML IJ SUSP
INTRAMUSCULAR | Status: AC
Start: 2015-10-17 — End: 2015-10-17
  Filled 2015-10-17: qty 5

## 2015-10-17 MED ORDER — ONDANSETRON HCL 4 MG/2ML IJ SOLN: 4.0000 mg | INTRAMUSCULAR | Status: DC | PRN

## 2015-10-17 MED ORDER — OXYMETAZOLINE HCL 0.05 % NA SOLN
NASAL | Status: DC | PRN
  Administered 2015-10-17: 1 via TOPICAL

## 2015-10-17 MED ORDER — MIDAZOLAM HCL 5 MG/5ML IJ SOLN
INTRAMUSCULAR | Status: DC | PRN
  Administered 2015-10-17: 2 mg via INTRAVENOUS

## 2015-10-17 MED ORDER — PROMETHAZINE HCL 25 MG/ML IJ SOLN: 6.2500 mg | INTRAMUSCULAR | Status: DC | PRN

## 2015-10-17 MED ORDER — OXYMETAZOLINE HCL 0.05 % NA SOLN
NASAL | Status: AC
Start: 2015-10-17 — End: 2015-10-17
  Filled 2015-10-17: qty 15

## 2015-10-17 MED ORDER — HYDROCODONE-ACETAMINOPHEN 7.5-325 MG PO TABS: 1.0000 | ORAL_TABLET | Freq: Once | ORAL | Status: DC | PRN

## 2015-10-17 MED ORDER — EPINEPHRINE HCL 1 MG/ML IJ SOLN
INTRAMUSCULAR | Status: AC
  Filled 2015-10-17: qty 1

## 2015-10-17 SURGICAL SUPPLY — 26 items
BALLN PULM 15 16.5 18X75 (BALLOONS)
BALLOON PULM 15 16.5 18X75 (BALLOONS) IMPLANT
BLADE SURG 15 STRL LF DISP TIS (BLADE) IMPLANT
BLADE SURG 15 STRL SS (BLADE)
CONT SPEC 4OZ CLIKSEAL STRL BL (MISCELLANEOUS) ×4 IMPLANT
COVER MAYO STAND STRL (DRAPES) ×2 IMPLANT
COVER TABLE BACK 60X90 (DRAPES) ×2 IMPLANT
CRADLE DONUT ADULT HEAD (MISCELLANEOUS) ×2 IMPLANT
DRAPE PROXIMA HALF (DRAPES) ×2 IMPLANT
GLOVE ECLIPSE 8.0 STRL XLNG CF (GLOVE) ×2 IMPLANT
GLOVE SURG SS PI 6.5 STRL IVOR (GLOVE) ×2 IMPLANT
GOWN BRE IMP SLV AUR LG STRL (GOWN DISPOSABLE) IMPLANT
GOWN STRL REUS W/ TWL XL LVL3 (GOWN DISPOSABLE) ×1 IMPLANT
GOWN STRL REUS W/TWL XL LVL3 (GOWN DISPOSABLE) ×1
GUARD TEETH (MISCELLANEOUS) ×2 IMPLANT
KIT BASIN OR (CUSTOM PROCEDURE TRAY) ×2 IMPLANT
KIT ROOM TURNOVER OR (KITS) ×2 IMPLANT
NEEDLE HYPO 25GX1X1/2 BEV (NEEDLE) IMPLANT
NS IRRIG 1000ML POUR BTL (IV SOLUTION) ×2 IMPLANT
PAD ARMBOARD 7.5X6 YLW CONV (MISCELLANEOUS) ×2 IMPLANT
PATTIES SURGICAL .5 X3 (DISPOSABLE) IMPLANT
SOLUTION ANTI FOG 6CC (MISCELLANEOUS) IMPLANT
SURGILUBE 2OZ TUBE FLIPTOP (MISCELLANEOUS) IMPLANT
SYR INFLATE BILIARY GAUGE (MISCELLANEOUS) IMPLANT
TOWEL OR 17X24 6PK STRL BLUE (TOWEL DISPOSABLE) ×2 IMPLANT
TUBE CONNECTING 12X1/4 (SUCTIONS) ×2 IMPLANT

## 2015-10-17 NOTE — Interval H&P Note (Signed)
History and Physical Interval Note:  10/17/2015 7:30 AM  Kenneth Ryan  has presented today for surgery, with the diagnosis of LEFT FALSE VOCAL CORD GRANULOMA  The various methods of treatment have been discussed with the patient and family. After consideration of risks, benefits and other options for treatment, the patient has consented to  Procedure(s): MICRO DIRECT LARYNGOSCOPY WITH FROZEN SECTION (Left) as a surgical intervention .  The patient's history has been re-reviewed, patient re-examined, no change in status, stable for surgery.  I have re-reviewed the patient's chart and labs.  Questions were answered to the patient's satisfaction.     Jodi Marble

## 2015-10-17 NOTE — Op Note (Signed)
10/17/2015  9:12 AM    Kenneth Ryan  [id number]   Pre-Op Dx:   Left  False vocal cord lesion  Post-op Dx:  same  Proc:  Microlaryngoscopy with biopsy   Surg:  Jodi Marble T MD  Anes:  GOT  EBL:   none  Comp:   none  Findings:   Slight telangiectatic  Changes  Of both vocal cords consistent with distant radiation therapy. Possible small granuloma of the anterior left false cord which was sucked up into the suction could not be retrieved. Minor mucosal atrophy and irregularity. Negative biopsy by frozen section of the superior surface of the true vocal cord and the anterior false vocal cord both on the left side.  Procedure:  With the patient in a comfortable supine position, general orotracheal anesthesia was induced without difficulty. At an appropriate level, the table was turned 90 and the patient placed in a slight reverse Trendelenburg. A clean preparation and draping was accomplished. A surgical timeout was performed.    a rubber tooth guard was placed. The anterior commissure laser laryngoscope was introduced but was too bulky to reach into the glottis proper. This was removed. A Hollinger anterior commissure laryngoscope was introduced , passed into the supraglottic larynx , and suspended in the standard fashion. Small amounts of blood were suctioned from the endolarynx including a possible small granuloma. There was bleeding at a small stump consistent with a granuloma. An Afrin moistened pledget was placed into the anterior larynx for hemostasis.    pledget was removed. 0 and 30 telescopes were  Used to examine the anterior larynx and glottis  With findings as described above. Using small cup forceps, a biopsy was taken on the superior surface of the left anterior true vocal cord, and also from the left false vocal cord at the small bleeding site consistent with the stump of the granuloma.    An Afrin pledget was replaced for hemostasis.   The biopsies were benign by  frozen section.    the Afrin pledget was removed.  Hemostasis was observed.The endolarynx was examined after un-suspending the scope and removing it.   The rubber tooth guard was removed. Dental status was intact. There was a minor bruise to the right lower lip.   Patient was returned to anesthesia, awakened, extubated, and transferred to recovery In stable condition.    Dispo:    PACU to home in care of family. Hydrocodone for pain relief and also cough suppression.  Plan:   Continue antireflux management. Return visit my office 3 weeks. Resume diet and activity promptly. Given low anticipated risk of postanesthetic or postsurgical complications, feel an outpatient venue is appropriate.  Tyson Alias MD

## 2015-10-17 NOTE — Discharge Instructions (Signed)
Resume voice use gently Hydrocodone for pain relief or also for cough suppression Advance diet as comfortable Advance activity  Recheck my office 3 weeks please. [phone] for an appointment

## 2015-10-17 NOTE — Anesthesia Preprocedure Evaluation (Addendum)
Anesthesia Evaluation  Patient identified by MRN, date of birth, ID band Patient awake    Reviewed: Allergy & Precautions, NPO status , Patient's Chart, lab work & pertinent test results  History of Anesthesia Complications (+) PONV  Airway Mallampati: II  TM Distance: >3 FB Neck ROM: Full    Dental  (+) Dental Advisory Given, Teeth Intact   Pulmonary former smoker,    breath sounds clear to auscultation       Cardiovascular + Peripheral Vascular Disease (4.8cm aortic root)  + Valvular Problems/Murmurs (Mild AS. Functionally bicuspid AV) AS  Rhythm:Regular Rate:Normal + Systolic murmurs    Neuro/Psych  Headaches,    GI/Hepatic Neg liver ROS, GERD  Medicated,  Endo/Other  negative endocrine ROS  Renal/GU negative Renal ROS     Musculoskeletal negative musculoskeletal ROS (+)   Abdominal   Peds  Hematology negative hematology ROS (+)   Anesthesia Other Findings   Reproductive/Obstetrics                            Anesthesia Physical Anesthesia Plan  ASA: III  Anesthesia Plan: General   Post-op Pain Management:    Induction: Intravenous  Airway Management Planned: Mask and Oral ETT  Additional Equipment:   Intra-op Plan:   Post-operative Plan: Extubation in OR  Informed Consent: I have reviewed the patients History and Physical, chart, labs and discussed the procedure including the risks, benefits and alternatives for the proposed anesthesia with the patient or authorized representative who has indicated his/her understanding and acceptance.   Dental advisory given  Plan Discussed with: CRNA  Anesthesia Plan Comments:         Anesthesia Quick Evaluation

## 2015-10-17 NOTE — H&P (View-Only) (Signed)
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  His most recent evaluation was 2014 and he was given a clean bill of health.  He stopped smoking in 1990.  For the past 3 months, he has some new onset hoarseness and also what feels like some loose phlegm in his chest although he cannot produce it.  No blood.  He does not drink significantly.  No pain.  No breathing or swallowing issues.  He does have known reflux but seems well-controlled with one Protonix each morning.  He has been doing this roughly 6 months.  On specific questioning, most of his symptoms are nocturnal.  No neck masses.  No change in weight, appetite, or energy.  I discussed his case with Dr. Crissie Sickles in Dr. Kingsley Plan absence.  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  this explains the audible murmur.  he also has some mitral regurgitation.  Dr. Lovena Le  thought he probably needed antibiotic prophylaxis for surgery.  schedule him next week if possible.  I will talk with Dr. [name] before surgery.  Patient would prefer not to have preoperative cardiac clearance per Dr. [name]. We  can go ahead and schedule the surgery, but it is possible that anesthesia will cancel.    PMH: Past Medical History  Diagnosis Date  . Hypertriglyceridemia   . 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  Marland Kitchen Chronic hoarseness     secondary to hx of laryngeal surgery  . History of tobacco abuse   . GERD (gastroesophageal reflux disease)   . Bicuspid aortic valve 07/2015  . Mitral regurgitation 07/2015    moderate    Surg Hx: Past Surgical History  Procedure Laterality Date  . Cholecystectomy  1980  . Tonsilectomy/adenoidectomy with myringotomy  1960  . Colonoscopy   2003 and 2013    Both normal.  . Transthoracic echocardiogram  07/2015    Bicuspid aortic valve + mod mitral regurg    FHx:   Family History  Problem Relation Age of Onset  . Heart disease Father   . Cancer Mother     breast   SocHx:  reports that he quit smoking about 36 years ago. He has never used smokeless tobacco. He reports that he drinks alcohol. He reports that he does not use illicit drugs.  ALLERGIES: No Known Allergies   (Not in a hospital admission)  No results found for this or any previous visit (from the past 48 hour(s)). No results found.  FKC:LEXNTZGY: Feeling tired (fatigue).  No fever, no night sweats, and no recent weight loss. Head: No headache. Eyes: No eye symptoms. Otolaryngeal: No hearing loss, no earache, no tinnitus, and no purulent nasal discharge.  No nasal passage blockage (stuffiness), no snoring, and no sneezing.  Hoarseness.  No sore throat. Cardiovascular: No chest pain or discomfort  and no palpitations. Pulmonary: No dyspnea.  Cough  and wheezing. Gastrointestinal: No dysphagia  and no heartburn.  No nausea, no abdominal pain, and no melena.  No diarrhea. Genitourinary: No dysuria. Endocrine: No muscle weakness. Musculoskeletal: No calf muscle cramps, no arthralgias, and no soft tissue swelling. Neurological: No dizziness, no fainting, no tingling, and no numbness. Psychological: No anxiety  and no depression.  Skin: No rash.  BP:127/67,  HR: 89 b/min,  BSA Calculated: 2.17 ,  BMI Calculated: 28.35 ,  Weight: 209 lb , BMI: 28.3 kg/m2,  Height: 6 ft.  PHYSICAL EXAM: He is somewhat overweight.  He is rather hoarse not diplophonic mental status is appropriate.  He hears well in conversational speech.  Voice is clear and respirations unlabored through the nose.  The head is atraumatic adnexa all.  Cranial nerves intact.  Ear canals are waxy but not excluded.  Anterior nose is moist with a leftward septal deviation and maxillary crest spur.   Oral cavity is clear with remaining teeth in good repair.  Oropharynx clear.  Neck is muscular but the thyroid gland feels somewhat enlarged.  No discrete adenopathy.   Using the flexible laryngoscope through the nose, nasopharynx shows adenoidectomy scarring with normal eustachian tori.  Oropharynx is clear.  Hypopharynx shows mobile vocal cords.  There is a 2-3 mm  red pedunculated cervical lesion hanging off of the false vocal cord anteriorly on the LEFT.  It may be attached to or simply pressing upon the true vocal cord.  Airway is good.  No pooling in valleculae or pyriforms.   Lungs: Clear to auscultation Heart: 3/6 systolic ejection murmur.  Regular rate and rhythm. Abdomen: Obese, active Extremities: Normal configuration Neurologic: Symmetric, grossly intact.   Assessment/Plan Gastroesophageal reflux disease, esophagitis presence not specified (530.81) (K21.9). 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  You have a small growth comprised of microscopic blood vessels called a granuloma on your LEFT false vocal cord, just above the real vocal cord.  I think this should be removed so that we can make sure it is not cancer although I do not think it is.  This may also help with your hoarseness.  I will talk with your cardiologist before we embark on surgery regarding your dilated aorta and heart murmur.   I think your Protonix might work better if you take it each evening before dinner.   Your thyroid gland  feels somewhat enlarged.  We will check an ultrasound.  Hydrocodone-Acetaminophen 7.5-325 MG/15ML Oral Solution;10-20 ml po q4-6h prn pain; [id number]; R0; Rx.  Jodi Marble 54/49/2010, 6:34 PM

## 2015-10-17 NOTE — Transfer of Care (Signed)
Immediate Anesthesia Transfer of Care Note  Patient: Kenneth Ryan  Procedure(s) Performed: Procedure(s): MICRO DIRECT LARYNGOSCOPY WITH FROZEN SECTION (Left)  Patient Location: PACU  Anesthesia Type:General  Level of Consciousness: awake, alert  and oriented  Airway & Oxygen Therapy: Patient Spontanous Breathing and Patient connected to nasal cannula oxygen  Post-op Assessment: Report given to RN and Post -op Vital signs reviewed and stable  Post vital signs: Reviewed and stable  Last Vitals:  Filed Vitals:   10/17/15 0914  BP: 141/56  Pulse: 95  Temp: 36.4 C  Resp: 17    Complications: No apparent anesthesia complications

## 2015-10-17 NOTE — Anesthesia Procedure Notes (Signed)
Procedure Name: Intubation Date/Time: 10/17/2015 7:44 AM Performed by: Mariea Clonts Pre-anesthesia Checklist: Emergency Drugs available, Timeout performed, Suction available, Patient identified and Patient being monitored Patient Re-evaluated:Patient Re-evaluated prior to inductionOxygen Delivery Method: Circle system utilized Preoxygenation: Pre-oxygenation with 100% oxygen Intubation Type: IV induction Ventilation: Mask ventilation without difficulty Laryngoscope Size: Miller and 2 Grade View: Grade III Tube type: Oral Tube size: 6.0 mm Number of attempts: 1 Placement Confirmation: ETT inserted through vocal cords under direct vision,  breath sounds checked- equal and bilateral and positive ETCO2 Tube secured with: Tape Dental Injury: Teeth and Oropharynx as per pre-operative assessment

## 2015-10-17 NOTE — Anesthesia Postprocedure Evaluation (Signed)
  Anesthesia Post-op Note  Patient: Kenneth Ryan  Procedure(s) Performed: Procedure(s): MICRO DIRECT LARYNGOSCOPY WITH FROZEN SECTION (Left)  Patient Location: PACU  Anesthesia Type:General  Level of Consciousness: awake  Airway and Oxygen Therapy: Patient Spontanous Breathing  Post-op Pain: none  Post-op Assessment: Post-op Vital signs reviewed, Patient's Cardiovascular Status Stable, Respiratory Function Stable, Patent Airway, No signs of Nausea or vomiting and Pain level controlled              Post-op Vital Signs: Reviewed and stable  Last Vitals:  Filed Vitals:   10/17/15 1000  BP: 132/66  Pulse: 81  Temp: 36.1 C  Resp: 19    Complications: No apparent anesthesia complications

## 2015-10-18 ENCOUNTER — Encounter (HOSPITAL_COMMUNITY): Payer: Self-pay | Admitting: Family Medicine

## 2015-10-22 ENCOUNTER — Ambulatory Visit
Admission: RE | Admit: 2015-10-22 | Discharge: 2015-10-22 | Disposition: A | Discharge status: Home / Self Care | Payer: Medicare Other | Source: Ambulatory Visit | Attending: Otolaryngology | Admitting: Otolaryngology

## 2015-10-22 ENCOUNTER — Other Ambulatory Visit: Payer: Medicare Other

## 2015-10-22 DIAGNOSIS — E049 Nontoxic goiter, unspecified: Secondary | ICD-10-CM

## 2016-02-20 ENCOUNTER — Other Ambulatory Visit: Payer: Self-pay | Admitting: Family Medicine

## 2016-02-20 NOTE — Telephone Encounter (Signed)
RF request for pantoprazole LOV: 09/19/15  Next ov: 09/18/16 Last written: 06/04/15 #30 w/ 3RF

## 2016-02-28 ENCOUNTER — Encounter: Payer: Self-pay | Admitting: Family Medicine

## 2016-02-28 ENCOUNTER — Ambulatory Visit (INDEPENDENT_AMBULATORY_CARE_PROVIDER_SITE_OTHER): Payer: PPO | Admitting: Family Medicine

## 2016-02-28 VITALS — BP 133/77 | HR 81 | Temp 97.6°F | Ht 72.0 in | Wt 211.0 lb

## 2016-02-28 DIAGNOSIS — J40 Bronchitis, not specified as acute or chronic: Secondary | ICD-10-CM

## 2016-02-28 MED ORDER — PREDNISONE 20 MG PO TABS: ORAL_TABLET | ORAL | Status: DC

## 2016-02-28 NOTE — Progress Notes (Signed)
OFFICE VISIT  02/28/2016   CC:  Chief Complaint  Patient presents with  . Chest Congestion    x7 days   HPI:    Patient is a 70 y.o. Caucasian male who presents for "congestion".  Onset about 1 week ago after choking on some coffee that he felt like he aspirated.  Has cough, rattle in tracheal and laryngeal areas.  No nasal congestion or signif ST.  No fever.  No malaise.  No SOB or chest pain.  He tried claritin but this didn't help any.   No n/v/d or rash.  Past Medical History  Diagnosis Date  . Hypertriglyceridemia   . 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  Marland Kitchen Chronic hoarseness     secondary to hx of laryngeal surgery  . History of tobacco abuse   . GERD (gastroesophageal reflux disease)   . Bicuspid aortic valve 07/2015  . Mitral regurgitation 07/2015    moderate  . Thoracic ascending aortic aneurysm (HCC)     48 mm 09/2015  . PONV (postoperative nausea and vomiting)   . Heart murmur   . Lesion of vocal cord   . Headache     Past Surgical History  Procedure Laterality Date  . Cholecystectomy  1980  . Tonsilectomy/adenoidectomy with myringotomy  1960  . Colonoscopy  2003 and 2013    Both normal.  . Transthoracic echocardiogram  07/2015    Bicuspid aortic valve + mod mitral regurg  . Vocal cord biopsy  09/2015    No atypia or inflammation.  Some fibrous changes that are possibly from hx of radiation therapy.  . Direct laryngoscopy Left 10/17/2015    Procedure: MICRO DIRECT LARYNGOSCOPY WITH FROZEN SECTION;  Surgeon: Jodi Marble, MD;  Location: [facility];  Service: ENT;  Laterality: Left;    Outpatient Prescriptions Prior to Visit  Medication Sig Dispense Refill  . aspirin 81 MG tablet Take 81 mg by mouth daily.    . fenofibrate (TRICOR) 145 MG tablet TAKE 1 TABLET(145 MG) BY MOUTH DAILY 90 tablet 3  . Multiple Vitamins-Minerals (MULTIVITAMIN PO) Take 1  tablet by mouth daily.     . pantoprazole (PROTONIX) 40 MG tablet TAKE 1 TABLET BY MOUTH EVERY DAY 90 tablet 3   No facility-administered medications prior to visit.    No Known Allergies  ROS As per HPI  PE: Blood pressure 133/77, pulse 81, temperature 97.6 F (36.4 C), temperature source Oral, height 6' (1.829 m), weight 211 lb (95.709 kg), SpO2 96 %. VS: noted--normal. Gen: alert, NAD, NONTOXIC APPEARING. HEENT: eyes without injection, drainage, or swelling.  Ears: EACs clear, TMs with normal light reflex and landmarks.  Nose: Clear rhinorrhea, with some dried, crusty exudate adherent to mildly injected mucosa.  No purulent d/c.  No paranasal sinus TTP.  No facial swelling.  Throat and mouth without focal lesion.  No pharyngial swelling, erythema, or exudate.   Neck: supple, no LAD.   LUNGS: CTA bilat, nonlabored resps.   CV: RRR, 4/6 systolic murmur, no r/g.  No diastolic murmur. EXT: no c/c/e SKIN: no rash  LABS:  none  IMPRESSION AND PLAN:  Acute laryngotracheobronchitis: viral vs aspiration of coffee per pt report. Prednisone 40mg  qd x 5d, then 20mg  qd x 5d. Robitussin plain or DM recommended, plus cool mist humidifier. No sign of RAD or bacterial infection. Signs/symptoms to call or return for were  reviewed and pt expressed understanding.  An After Visit Summary was printed and given to the patient.  FOLLOW UP: Return if symptoms worsen or fail to improve.

## 2016-02-28 NOTE — Patient Instructions (Signed)
Try robitussin or robitussin DM (generic is fine)

## 2016-08-21 DIAGNOSIS — I428 Other cardiomyopathies: Secondary | ICD-10-CM

## 2016-08-21 HISTORY — DX: Other cardiomyopathies: I42.8

## 2016-08-28 ENCOUNTER — Other Ambulatory Visit: Payer: Self-pay

## 2016-08-28 ENCOUNTER — Ambulatory Visit (HOSPITAL_COMMUNITY): Payer: PPO | Attending: Cardiovascular Disease

## 2016-08-28 DIAGNOSIS — I340 Nonrheumatic mitral (valve) insufficiency: Secondary | ICD-10-CM | POA: Diagnosis not present

## 2016-08-28 DIAGNOSIS — Z87891 Personal history of nicotine dependence: Secondary | ICD-10-CM | POA: Diagnosis not present

## 2016-08-28 DIAGNOSIS — I351 Nonrheumatic aortic (valve) insufficiency: Secondary | ICD-10-CM | POA: Diagnosis not present

## 2016-08-28 DIAGNOSIS — I77810 Thoracic aortic ectasia: Secondary | ICD-10-CM

## 2016-08-28 DIAGNOSIS — I517 Cardiomegaly: Secondary | ICD-10-CM | POA: Insufficient documentation

## 2016-08-28 DIAGNOSIS — Q231 Congenital insufficiency of aortic valve: Secondary | ICD-10-CM

## 2016-09-07 ENCOUNTER — Ambulatory Visit (INDEPENDENT_AMBULATORY_CARE_PROVIDER_SITE_OTHER): Payer: PPO

## 2016-09-07 DIAGNOSIS — Z23 Encounter for immunization: Secondary | ICD-10-CM

## 2016-09-16 ENCOUNTER — Ambulatory Visit (INDEPENDENT_AMBULATORY_CARE_PROVIDER_SITE_OTHER): Payer: PPO | Admitting: Cardiology

## 2016-09-16 ENCOUNTER — Encounter: Payer: Self-pay | Admitting: *Deleted

## 2016-09-16 ENCOUNTER — Encounter: Payer: Self-pay | Admitting: Cardiology

## 2016-09-16 VITALS — BP 136/80 | HR 94 | Ht 72.0 in | Wt 211.2 lb

## 2016-09-16 DIAGNOSIS — I340 Nonrheumatic mitral (valve) insufficiency: Secondary | ICD-10-CM

## 2016-09-16 DIAGNOSIS — Q2381 Bicuspid aortic valve: Secondary | ICD-10-CM

## 2016-09-16 DIAGNOSIS — I77810 Thoracic aortic ectasia: Secondary | ICD-10-CM

## 2016-09-16 DIAGNOSIS — Q231 Congenital insufficiency of aortic valve: Secondary | ICD-10-CM | POA: Diagnosis not present

## 2016-09-16 DIAGNOSIS — E781 Pure hyperglyceridemia: Secondary | ICD-10-CM

## 2016-09-16 MED ORDER — METOPROLOL SUCCINATE ER 25 MG PO TB24: 25.0000 mg | ORAL_TABLET | Freq: Every day | ORAL | 3 refills | Status: DC

## 2016-09-16 NOTE — Addendum Note (Signed)
Addended by: Candee Furbish C on: 09/16/2016 02:20 PM   Modules accepted: Orders, SmartSet

## 2016-09-16 NOTE — Patient Instructions (Addendum)
Medication Instructions:  Please take Metoprolol XL 25 mg a day. Continue all other medications as listed.  Testing/Procedures: Your physician has requested that you have a TEE. During a TEE, sound waves are used to create images of your heart. It provides your doctor with information about the size and shape of your heart and how well your heart's chambers and valves are working. In this test, a transducer is attached to the end of a flexible tube that's guided down your throat and into your esophagus (the tube leading from you mouth to your stomach) to get a more detailed image of your heart. You are not awake for the procedure. Please see the instruction sheet given to you today. For further information please visit [email or web address].  You have been referred to the surgeon to discuss your dilated Aortic root, MR and Bicuspid valve.  You will be contacted by that office to be scheduled.  Follow-Up: Follow up in 6 months with Dr. [name].  You will receive a letter in the mail 2 months before you are due.  Please call us when you receive this letter to schedule your follow up appointment.   If you need a refill on your cardiac medications before your next appointment, please call your pharmacy.  Thank you for choosing [facility]!!

## 2016-09-16 NOTE — Progress Notes (Signed)
Cardiology Office Note   Date:  09/16/2016   ID:  Kenneth Ryan, DOB March 24, 1946, MRN [id number]  PCP:  Tammi Sou, MD  Cardiologist:   Candee Furbish, MD       History of Present Illness: Kenneth Ryan is a 70 y.o. male who presents for evaluation of  of bicuspid aortic valve and moderate to severe mitral regurgitation.    Aortic root has been rarely stable at 48-49 mm. CT scan has been performed as well as echocardiogram.  Most recent echocardiogram however did demonstrate a reduction in his ejection fraction and more severity in his mitral regurgitation. We will evaluate with TEE.  He does not describe any increasing dyspnea on exertion, no chest pain, no syncope, no bleeding. He understands that eventually he will need surgery.  Father had CABG. No valve replacement.    Past Medical History:  Diagnosis Date  . Bicuspid aortic valve 07/2015  . Chronic hoarseness    secondary to hx of laryngeal surgery  . GERD (gastroesophageal reflux disease)   . Headache   . Heart murmur   . History of tobacco abuse   . Hypertriglyceridemia   . 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  Marland Kitchen Lesion of vocal cord   . Mitral regurgitation 07/2015   moderate  . PONV (postoperative nausea and vomiting)   . Thoracic ascending aortic aneurysm (HCC)    48 mm 09/2015    Past Surgical History:  Procedure Laterality Date  . CHOLECYSTECTOMY  1980  . COLONOSCOPY  2003 and 2013   Both normal.  . DIRECT LARYNGOSCOPY Left 10/17/2015   Procedure: MICRO DIRECT LARYNGOSCOPY WITH FROZEN SECTION;  Surgeon: Jodi Marble, MD;  Location: [place];  Service: ENT;  Laterality: Left;  . TONSILECTOMY/ADENOIDECTOMY WITH MYRINGOTOMY  1960  . TRANSTHORACIC ECHOCARDIOGRAM  07/2015   Bicuspid aortic valve + mod mitral regurg  . Vocal cord biopsy  09/2015   No atypia or inflammation.  Some  fibrous changes that are possibly from hx of radiation therapy.     Current Outpatient Prescriptions  Medication Sig Dispense Refill  . aspirin 81 MG tablet Take 81 mg by mouth daily.    . fenofibrate (TRICOR) 145 MG tablet TAKE 1 TABLET(145 MG) BY MOUTH DAILY 90 tablet 3  . Multiple Vitamins-Minerals (MULTIVITAMIN PO) Take 1 tablet by mouth daily.     . pantoprazole (PROTONIX) 40 MG tablet TAKE 1 TABLET BY MOUTH EVERY DAY 90 tablet 3  . metoprolol succinate (TOPROL XL) 25 MG 24 hr tablet Take 1 tablet (25 mg total) by mouth daily. 90 tablet 3  . predniSONE (DELTASONE) 20 MG tablet 2 tabs po qd x 5d, then 1 tab po qd x 5d (Patient not taking: Reported on 09/16/2016) 15 tablet 0   No current facility-administered medications for this visit.     Allergies:   Review of patient's allergies indicates no known allergies.    Social History:  The patient  reports that he quit smoking about 37 years ago. He has never used smokeless tobacco. He reports that he drinks alcohol. He reports that he does not use drugs.   Family History:  The patient's family history includes Cancer in his mother; Heart disease in his father.    ROS:  Please see the history of present illness.   Otherwise, review of systems are positive for chronic congestion, hoarseness.  All other systems are reviewed and negative.    PHYSICAL EXAM: VS:  BP 136/80   Pulse 94   Ht 6' (1.829 m)   Wt 211 lb 4 oz (95.8 kg)   BMI 28.65 kg/m  , BMI Body mass index is 28.65 kg/m. GEN: Well nourished, well developed, in no acute distress HEENT: normal Neck: no JVD, positive carotid bruits, likely radiation of aortic murmur, or masses, abrupt upstroke Cardiac: RRR; 3/6 systolic mid peaking crescendo decrescendo murmur, also murmur heard at apex holosystolic, no rubs, or gallops,no edema  Respiratory:  clear to auscultation bilaterally, normal work of breathing GI: soft, nontender, nondistended, + BS MS: no deformity or  atrophy Skin: warm and dry, no rash Neuro:  Strength and sensation are intact Psych: euthymic mood, full affect   EKG:  Today's EKG 09/16/16-sinus rhythm, 94, LVH, no other abnormalities. 09/17/15-sinus rhythm, 77, no other abnormalities.  Echo: 08/16/15 - Left ventricle: The cavity size was mildly dilated. Wall thickness was normal. Systolic function was normal. The estimated ejection fraction was in the range of 55% to 60%. - Aortic valve: Functionally bicuspid; severely calcified leaflets. There was mild stenosis. There was mild regurgitation. Valve area (VTI): 2.02 cm^2. Valve area (Vmax): 1.68 cm^2. Valve area (Vmean): 1.75 cm^2. - Aorta: Moderately dilated Given bicuspid appearing valve consider cardiac CT to further size aorta and look at valve - Mitral valve: Calcified annulus. Mildly thickened leaflets . There was moderate regurgitation. Valve area by continuity equation (using LVOT flow): 2.44 cm^2. - Left atrium: The atrium was mildly to moderately dilated. - Atrial septum: No defect or patent foramen ovale was identified.  Echocardiogram: 08/28/16 - Left ventricle: The cavity size was moderately dilated. Wall   thickness was normal. Systolic function was mildly to moderately   reduced. The estimated ejection fraction was in the range of 40%   to 45%. Features are consistent with a pseudonormal left   ventricular filling pattern, with concomitant abnormal relaxation   and increased filling pressure (grade 2 diastolic dysfunction). - Aortic valve: There was mild regurgitation. - Mitral valve: There was severe regurgitation. (Personally viewed appears more moderate to severe)  - Left atrium: The atrium was moderately dilated. Recent Labs: 10/16/2015: BUN 14; Creatinine, Ser 0.88; Hemoglobin 14.5; Platelets 181; Potassium 4.1; Sodium 141    Lipid Panel    Component Value Date/Time   CHOL 162 08/13/2015 0839   TRIG 87.0 08/13/2015 0839   HDL 43.30  08/13/2015 0839   CHOLHDL 4 08/13/2015 0839   VLDL 17.4 08/13/2015 0839   LDLCALC 101 (H) 08/13/2015 0839      Wt Readings from Last 3 Encounters:  09/16/16 211 lb 4 oz (95.8 kg)  02/28/16 211 lb (95.7 kg)  10/17/15 207 lb (93.9 kg)      Other studies Reviewed: Additional studies/ records that were reviewed today include: Office note, EKG, lab work. Review of the above records demonstrates: As above   ASSESSMENT AND PLAN:  1.  Bicuspid aortic valve-Mild to moderate aortic valve stenosis at this point but severely calcified. Mild regurgitation previously reported on most recent echocardiogram. Congenital abnormality. His father did not have valve replacement. He has had a lifelong murmur. Does not seem to have any symptoms were regarding this condition. We will continue to monitor closely. He knows to contact me if signs or symptoms develop such as shortness of breath or anginal discomfort. No syncope. Continue healthy lifestyle. Exercise. If symptoms do develop, we will have low threshold for stress  test as well.  2. Dilated aortic root-avoid extreme lifting exercises/power lifting. Blood pressure is under good control. If blood pressure elevates, I would add beta blocker to reduce shear stress. Currently 48-9 mm. we will go ahead and refer to cardio thoracic surgery for further discussion. At some point he may need aortic root replacement, possible aortic valve replacement and now with his mitral valve demonstrating more significant regurgitation, mitral valve repair. We will check a TEE.  Mitral valve regurgitation  - Was previously mild, more significant on most current echocardiogram. Appears to be moderate to severe. Further quantification I will perform a transesophageal echocardiogram. This will also allow [place] to measure the aortic root as well as analyze his bicuspid aortic valve at the same time. He reports that he is not having any increasing shortness of breath however his most  recent echocardiogram showed an EF of 40-45%. We are starting Toprol 25 mg. Cardiothoracic surgery consultation will be helpful. We will set up.  3. Hypertriglyceridemia-baseline triglycerides in the 400 range. Excellent response to fibrates. Continue.  4. History of laryngeal cancer-early 2000's. If congestion does not improve, consider further ENT evaluation as directed by Dr. Ernestine Conrad, his primary physician.   Current medicines are reviewed at length with the patient today.  The patient does not have concerns regarding medicines.   Disposition:   FU with Shaylee Stanislawski in 6 months. We will discuss results of testing however prior to this. Cardiothoracic surgery evaluation as well. TEE.  Signed, Candee Furbish, MD  09/16/2016 11:27 AM    [facility] [place], [place], [place]  [address] Phone: [phone]; Fax: [phone]

## 2016-09-17 ENCOUNTER — Ambulatory Visit (HOSPITAL_BASED_OUTPATIENT_CLINIC_OR_DEPARTMENT_OTHER)
Admission: RE | Admit: 2016-09-17 | Discharge: 2016-09-17 | Disposition: A | Discharge status: Home / Self Care | Payer: PPO | Source: Ambulatory Visit | Attending: Cardiology | Admitting: Cardiology

## 2016-09-17 ENCOUNTER — Encounter (HOSPITAL_COMMUNITY)
Admission: RE | Disposition: A | Discharge status: Home / Self Care | Payer: Self-pay | Source: Ambulatory Visit | Attending: Cardiology

## 2016-09-17 ENCOUNTER — Encounter (HOSPITAL_COMMUNITY): Payer: Self-pay | Admitting: *Deleted

## 2016-09-17 ENCOUNTER — Ambulatory Visit (HOSPITAL_COMMUNITY)
Admission: RE | Admit: 2016-09-17 | Discharge: 2016-09-17 | Disposition: A | Discharge status: Home / Self Care | Payer: PPO | Source: Ambulatory Visit | Attending: Cardiology | Admitting: Cardiology

## 2016-09-17 DIAGNOSIS — Z87891 Personal history of nicotine dependence: Secondary | ICD-10-CM | POA: Diagnosis not present

## 2016-09-17 DIAGNOSIS — Z8521 Personal history of malignant neoplasm of larynx: Secondary | ICD-10-CM | POA: Insufficient documentation

## 2016-09-17 DIAGNOSIS — K219 Gastro-esophageal reflux disease without esophagitis: Secondary | ICD-10-CM | POA: Diagnosis not present

## 2016-09-17 DIAGNOSIS — E781 Pure hyperglyceridemia: Secondary | ICD-10-CM | POA: Diagnosis not present

## 2016-09-17 DIAGNOSIS — I340 Nonrheumatic mitral (valve) insufficiency: Secondary | ICD-10-CM

## 2016-09-17 DIAGNOSIS — Z79899 Other long term (current) drug therapy: Secondary | ICD-10-CM | POA: Insufficient documentation

## 2016-09-17 DIAGNOSIS — Q231 Congenital insufficiency of aortic valve: Secondary | ICD-10-CM | POA: Diagnosis not present

## 2016-09-17 DIAGNOSIS — Z7982 Long term (current) use of aspirin: Secondary | ICD-10-CM | POA: Diagnosis not present

## 2016-09-17 HISTORY — PX: TEE WITHOUT CARDIOVERSION: SHX5443

## 2016-09-17 HISTORY — PX: TRANSESOPHAGEAL ECHOCARDIOGRAM: SHX273

## 2016-09-17 SURGERY — ECHOCARDIOGRAM, TRANSESOPHAGEAL
Anesthesia: Moderate Sedation

## 2016-09-17 MED ORDER — MIDAZOLAM HCL 5 MG/ML IJ SOLN
INTRAMUSCULAR | Status: AC
  Filled 2016-09-17: qty 2

## 2016-09-17 MED ORDER — SODIUM CHLORIDE 0.9 % IV SOLN
INTRAVENOUS | Status: DC
Start: 2016-09-17 — End: 2016-09-17
  Administered 2016-09-17: 500 mL via INTRAVENOUS

## 2016-09-17 MED ORDER — MIDAZOLAM HCL 10 MG/2ML IJ SOLN
INTRAMUSCULAR | Status: DC | PRN
  Administered 2016-09-17 (×2): 2 mg via INTRAVENOUS

## 2016-09-17 MED ORDER — FENTANYL CITRATE (PF) 100 MCG/2ML IJ SOLN
INTRAMUSCULAR | Status: DC | PRN
  Administered 2016-09-17: 50 ug via INTRAVENOUS
  Administered 2016-09-17: 25 ug via INTRAVENOUS

## 2016-09-17 MED ORDER — BUTAMBEN-TETRACAINE-BENZOCAINE 2-2-14 % EX AERO
INHALATION_SPRAY | CUTANEOUS | Status: DC | PRN
  Administered 2016-09-17: 2 via TOPICAL

## 2016-09-17 MED ORDER — FENTANYL CITRATE (PF) 100 MCG/2ML IJ SOLN
INTRAMUSCULAR | Status: AC
  Filled 2016-09-17: qty 2

## 2016-09-17 NOTE — Interval H&P Note (Signed)
History and Physical Interval Note:  09/17/2016 2:33 PM  Rodner Kummer  has presented today for surgery, with the diagnosis of MITRAL REGURATION  The various methods of treatment have been discussed with the patient and family. After consideration of risks, benefits and other options for treatment, the patient has consented to  Procedure(s): TRANSESOPHAGEAL ECHOCARDIOGRAM (TEE) (N/A) as a surgical intervention .  The patient's history has been reviewed, patient examined, no change in status, stable for surgery.  I have reviewed the patient's chart and labs.  Questions were answered to the patient's satisfaction.     [employer]

## 2016-09-17 NOTE — Discharge Instructions (Signed)

## 2016-09-17 NOTE — CV Procedure (Signed)
     TEE  Findings  Severe MR, ruptured chordae, eccentric jet  Moderate to severe aortic regurgitation with functional bicuspid valve.  Dilated aortic root 40mm ascending.   EF 45%  Plan:  - TCTS consult.  - with reduction in EF and severe valvular lesions, recommend surgery. (Mitral valve repair/replacement, [place] AV).   - Will need cath prior.  - Discussed with patient and family.   Candee Furbish, MD

## 2016-09-17 NOTE — H&P (View-Only) (Signed)
Cardiology Office Note   Date:  09/16/2016   ID:  Kenneth Ryan, DOB Nov 01, 1946, MRN [id number]  PCP:  Tammi Sou, MD  Cardiologist:   Candee Furbish, MD       History of Present Illness: Kenneth Ryan is a 70 y.o. male who presents for evaluation of  of bicuspid aortic valve and moderate to severe mitral regurgitation.    Aortic root has been rarely stable at 48-49 mm. CT scan has been performed as well as echocardiogram.  Most recent echocardiogram however did demonstrate a reduction in his ejection fraction and more severity in his mitral regurgitation. We will evaluate with TEE.  He does not describe any increasing dyspnea on exertion, no chest pain, no syncope, no bleeding. He understands that eventually he will need surgery.  Father had CABG. No valve replacement.    Past Medical History:  Diagnosis Date  . Bicuspid aortic valve 07/2015  . Chronic hoarseness    secondary to hx of laryngeal surgery  . GERD (gastroesophageal reflux disease)   . Headache   . Heart murmur   . History of tobacco abuse   . Hypertriglyceridemia   . 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  Marland Kitchen Lesion of vocal cord   . Mitral regurgitation 07/2015   moderate  . PONV (postoperative nausea and vomiting)   . Thoracic ascending aortic aneurysm (HCC)    48 mm 09/2015    Past Surgical History:  Procedure Laterality Date  . CHOLECYSTECTOMY  1980  . COLONOSCOPY  2003 and 2013   Both normal.  . DIRECT LARYNGOSCOPY Left 10/17/2015   Procedure: MICRO DIRECT LARYNGOSCOPY WITH FROZEN SECTION;  Surgeon: Jodi Marble, MD;  Location: [place];  Service: ENT;  Laterality: Left;  . TONSILECTOMY/ADENOIDECTOMY WITH MYRINGOTOMY  1960  . TRANSTHORACIC ECHOCARDIOGRAM  07/2015   Bicuspid aortic valve + mod mitral regurg  . Vocal cord biopsy  09/2015   No atypia or inflammation.  Some  fibrous changes that are possibly from hx of radiation therapy.     Current Outpatient Prescriptions  Medication Sig Dispense Refill  . aspirin 81 MG tablet Take 81 mg by mouth daily.    . fenofibrate (TRICOR) 145 MG tablet TAKE 1 TABLET(145 MG) BY MOUTH DAILY 90 tablet 3  . Multiple Vitamins-Minerals (MULTIVITAMIN PO) Take 1 tablet by mouth daily.     . pantoprazole (PROTONIX) 40 MG tablet TAKE 1 TABLET BY MOUTH EVERY DAY 90 tablet 3  . metoprolol succinate (TOPROL XL) 25 MG 24 hr tablet Take 1 tablet (25 mg total) by mouth daily. 90 tablet 3  . predniSONE (DELTASONE) 20 MG tablet 2 tabs po qd x 5d, then 1 tab po qd x 5d (Patient not taking: Reported on 09/16/2016) 15 tablet 0   No current facility-administered medications for this visit.     Allergies:   Review of patient's allergies indicates no known allergies.    Social History:  The patient  reports that he quit smoking about 37 years ago. He has never used smokeless tobacco. He reports that he drinks alcohol. He reports that he does not use drugs.   Family History:  The patient's family history includes Cancer in his mother; Heart disease in his father.    ROS:  Please see the history of present illness.   Otherwise, review of systems are positive for chronic congestion, hoarseness.  All other systems are reviewed and negative.    PHYSICAL EXAM: VS:  BP 136/80   Pulse 94   Ht 6' (1.829 m)   Wt 211 lb 4 oz (95.8 kg)   BMI 28.65 kg/m  , BMI Body mass index is 28.65 kg/m. GEN: Well nourished, well developed, in no acute distress HEENT: normal Neck: no JVD, positive carotid bruits, likely radiation of aortic murmur, or masses, abrupt upstroke Cardiac: RRR; 3/6 systolic mid peaking crescendo decrescendo murmur, also murmur heard at apex holosystolic, no rubs, or gallops,no edema  Respiratory:  clear to auscultation bilaterally, normal work of breathing GI: soft, nontender, nondistended, + BS MS: no deformity or  atrophy Skin: warm and dry, no rash Neuro:  Strength and sensation are intact Psych: euthymic mood, full affect   EKG:  Today's EKG 09/16/16-sinus rhythm, 94, LVH, no other abnormalities. 09/17/15-sinus rhythm, 77, no other abnormalities.  Echo: 08/16/15 - Left ventricle: The cavity size was mildly dilated. Wall thickness was normal. Systolic function was normal. The estimated ejection fraction was in the range of 55% to 60%. - Aortic valve: Functionally bicuspid; severely calcified leaflets. There was mild stenosis. There was mild regurgitation. Valve area (VTI): 2.02 cm^2. Valve area (Vmax): 1.68 cm^2. Valve area (Vmean): 1.75 cm^2. - Aorta: Moderately dilated Given bicuspid appearing valve consider cardiac CT to further size aorta and look at valve - Mitral valve: Calcified annulus. Mildly thickened leaflets . There was moderate regurgitation. Valve area by continuity equation (using LVOT flow): 2.44 cm^2. - Left atrium: The atrium was mildly to moderately dilated. - Atrial septum: No defect or patent foramen ovale was identified.  Echocardiogram: 08/28/16 - Left ventricle: The cavity size was moderately dilated. Wall   thickness was normal. Systolic function was mildly to moderately   reduced. The estimated ejection fraction was in the range of 40%   to 45%. Features are consistent with a pseudonormal left   ventricular filling pattern, with concomitant abnormal relaxation   and increased filling pressure (grade 2 diastolic dysfunction). - Aortic valve: There was mild regurgitation. - Mitral valve: There was severe regurgitation. (Personally viewed appears more moderate to severe)  - Left atrium: The atrium was moderately dilated. Recent Labs: 10/16/2015: BUN 14; Creatinine, Ser 0.88; Hemoglobin 14.5; Platelets 181; Potassium 4.1; Sodium 141    Lipid Panel    Component Value Date/Time   CHOL 162 08/13/2015 0839   TRIG 87.0 08/13/2015 0839   HDL 43.30  08/13/2015 0839   CHOLHDL 4 08/13/2015 0839   VLDL 17.4 08/13/2015 0839   LDLCALC 101 (H) 08/13/2015 0839      Wt Readings from Last 3 Encounters:  09/16/16 211 lb 4 oz (95.8 kg)  02/28/16 211 lb (95.7 kg)  10/17/15 207 lb (93.9 kg)      Other studies Reviewed: Additional studies/ records that were reviewed today include: Office note, EKG, lab work. Review of the above records demonstrates: As above   ASSESSMENT AND PLAN:  1.  Bicuspid aortic valve-Mild to moderate aortic valve stenosis at this point but severely calcified. Mild regurgitation previously reported on most recent echocardiogram. Congenital abnormality. His father did not have valve replacement. He has had a lifelong murmur. Does not seem to have any symptoms were regarding this condition. We will continue to monitor closely. He knows to contact me if signs or symptoms develop such as shortness of breath or anginal discomfort. No syncope. Continue healthy lifestyle. Exercise. If symptoms do develop, we will have low threshold for stress  test as well.  2. Dilated aortic root-avoid extreme lifting exercises/power lifting. Blood pressure is under good control. If blood pressure elevates, I would add beta blocker to reduce shear stress. Currently 48-9 mm. we will go ahead and refer to cardio thoracic surgery for further discussion. At some point he may need aortic root replacement, possible aortic valve replacement and now with his mitral valve demonstrating more significant regurgitation, mitral valve repair. We will check a TEE.  Mitral valve regurgitation  - Was previously mild, more significant on most current echocardiogram. Appears to be moderate to severe. Further quantification I will perform a transesophageal echocardiogram. This will also allow [place] to measure the aortic root as well as analyze his bicuspid aortic valve at the same time. He reports that he is not having any increasing shortness of breath however his most  recent echocardiogram showed an EF of 40-45%. We are starting Toprol 25 mg. Cardiothoracic surgery consultation will be helpful. We will set up.  3. Hypertriglyceridemia-baseline triglycerides in the 400 range. Excellent response to fibrates. Continue.  4. History of laryngeal cancer-early 2000's. If congestion does not improve, consider further ENT evaluation as directed by Dr. Ernestine Conrad, his primary physician.   Current medicines are reviewed at length with the patient today.  The patient does not have concerns regarding medicines.   Disposition:   FU with Corey Caulfield in 6 months. We will discuss results of testing however prior to this. Cardiothoracic surgery evaluation as well. TEE.  Signed, Candee Furbish, MD  09/16/2016 11:27 AM    [facility] [place], [place],   [address] Phone: [phone]; Fax: [phone]

## 2016-09-17 NOTE — Addendum Note (Signed)
Addended by: Paulla Dolly on: 09/17/2016 08:28 AM   Modules accepted: Orders

## 2016-09-18 ENCOUNTER — Ambulatory Visit (INDEPENDENT_AMBULATORY_CARE_PROVIDER_SITE_OTHER): Payer: PPO | Admitting: Family Medicine

## 2016-09-18 ENCOUNTER — Encounter: Payer: Self-pay | Admitting: Family Medicine

## 2016-09-18 VITALS — BP 127/75 | HR 68 | Temp 97.8°F | Resp 16 | Ht 72.5 in | Wt 210.8 lb

## 2016-09-18 DIAGNOSIS — Z0000 Encounter for general adult medical examination without abnormal findings: Secondary | ICD-10-CM

## 2016-09-18 DIAGNOSIS — Z125 Encounter for screening for malignant neoplasm of prostate: Secondary | ICD-10-CM | POA: Diagnosis not present

## 2016-09-18 DIAGNOSIS — Z23 Encounter for immunization: Secondary | ICD-10-CM

## 2016-09-18 NOTE — Progress Notes (Signed)
Pre visit review using our clinic review tool, if applicable. No additional management support is needed unless otherwise documented below in the visit note. 

## 2016-09-18 NOTE — Addendum Note (Signed)
Addended by: Gordy Councilman on: 09/18/2016 09:35 AM   Modules accepted: Orders

## 2016-09-18 NOTE — Progress Notes (Signed)
Office Note 09/18/2016  CC:  Chief Complaint  Patient presents with  . Annual Exam    CPE    HPI:  Kenneth Ryan is a 70 y.o. White male who is here for annual health maintenance exam. Yesterday had TEE to further eval AI and MR:  His MR had worsened in severity, he had a ruptured chordae, EF 45%, and AR was mod/severe.  Plan is for CT surg consult for consideration of AV and MV replacement, but he would need a cath first. He feels no CP or exertional or resting SOB.  No palpitations or presyncope/syncope.  No exercise or dieting. Preventative dental visits UTD.  No acute complaints.  Past Medical History:  Diagnosis Date  . Bicuspid aortic valve 07/2015   TEE 08/2016 mod/sev AR w/functional bicuspid AV  . Chronic hoarseness    secondary to hx of laryngeal surgery  . GERD (gastroesophageal reflux disease)   . Headache   . Heart murmur   . History of tobacco abuse   . Hypertriglyceridemia   . 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  Marland Kitchen Lesion of vocal cord   . Mitral regurgitation 07/2015   moderate.  TEE 08/2016 severe MR  . PONV (postoperative nausea and vomiting)   . Thoracic ascending aortic aneurysm (HCC)    48 mm 09/2015    Past Surgical History:  Procedure Laterality Date  . CHOLECYSTECTOMY  1980  . COLONOSCOPY  03/25/2002 and 25-Mar-2012   Both normal.  . DIRECT LARYNGOSCOPY Left 10/17/2015   Procedure: MICRO DIRECT LARYNGOSCOPY WITH FROZEN SECTION;  Surgeon: Jodi Marble, MD;  Location: [place];  Service: ENT;  Laterality: Left;  . TONSILECTOMY/ADENOIDECTOMY WITH MYRINGOTOMY  1960  . 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  Mod/severe AR w/functional bicuspid AV.  EF 45%.  . TRANSTHORACIC ECHOCARDIOGRAM  07/2015   Bicuspid aortic valve + mod mitral regurg  . Vocal cord biopsy  09/2015   No atypia or inflammation.  Some  fibrous changes that are possibly from hx of radiation therapy.    Family History  Problem Relation Age of Onset  . Heart disease Father   . Cancer Mother     breast    Social History   Social History  . Marital status: Married    Spouse name: N/A  . Number of children: N/A  . Years of education: N/A   Occupational History  . Not on file.   Social History Main Topics  . Smoking status: Former Smoker    Quit date: 07/22/1979  . Smokeless tobacco: Never Used  . Alcohol use Yes     Comment: rare  . Drug use: No  . Sexual activity: Not on file   Other Topics Concern  . Not on file   Social History Narrative   Married, one son deceased 03/26/2011.   Relocated from [place] to [name] 06/2013 to be near grandchildren.   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   Smoker 20 pack-yr hx: quit about 1980.   Alcohol: 3 bottles of beer per week avg.     Drugs: none             Outpatient Medications Prior to Visit  Medication Sig Dispense Refill  . aspirin 81 MG tablet Take 81 mg by mouth daily.    . fenofibrate (TRICOR) 145 MG tablet TAKE 1 TABLET(145 MG) BY MOUTH  DAILY 90 tablet 3  . metoprolol succinate (TOPROL XL) 25 MG 24 hr tablet Take 1 tablet (25 mg total) by mouth daily. 90 tablet 3  . Multiple Vitamins-Minerals (MULTIVITAMIN PO) Take 1 tablet by mouth daily.     . pantoprazole (PROTONIX) 40 MG tablet TAKE 1 TABLET BY MOUTH EVERY DAY 90 tablet 3  . predniSONE (DELTASONE) 20 MG tablet 2 tabs po qd x 5d, then 1 tab po qd x 5d (Patient not taking: Reported on 09/18/2016) 15 tablet 0   No facility-administered medications prior to visit.     No Known Allergies  ROS Review of Systems  Constitutional: Negative for appetite change, chills, fatigue and fever.  HENT: Negative for congestion, dental problem, ear pain and sore throat.   Eyes: Negative for discharge, redness and visual disturbance.  Respiratory: Negative for cough, chest tightness, shortness of breath and wheezing.    Cardiovascular: Negative for chest pain, palpitations and leg swelling.  Gastrointestinal: Negative for abdominal pain, blood in stool, diarrhea, nausea and vomiting.  Genitourinary: Negative for difficulty urinating, dysuria, flank pain, frequency, hematuria and urgency.  Musculoskeletal: Negative for arthralgias, back pain, joint swelling, myalgias and neck stiffness.  Skin: Negative for pallor and rash.  Neurological: Negative for dizziness, speech difficulty, weakness and headaches.  Hematological: Negative for adenopathy. Does not bruise/bleed easily.  Psychiatric/Behavioral: Negative for confusion and sleep disturbance. The patient is not nervous/anxious.     PE; Blood pressure 127/75, pulse 68, temperature 97.8 F (36.6 C), temperature source Oral, resp. rate 16, height 6' 0.5" (1.842 m), weight 210 lb 12.8 oz (95.6 kg), SpO2 96 %. Gen: Alert, well appearing.  Patient is oriented to person, place, time, and situation. AFFECT: pleasant, lucid thought and speech. ENT: Ears: EACs clear, normal epithelium.  TMs with good light reflex and landmarks bilaterally.  Eyes: no injection, icteris, swelling, or exudate.  EOMI, PERRLA. Nose: no drainage or turbinate edema/swelling.  No injection or focal lesion.  Mouth: lips without lesion/swelling.  Oral mucosa pink and moist.  Dentition intact and without obvious caries or gingival swelling.  Oropharynx without erythema, exudate, or swelling.  Neck: supple/nontender.  No LAD, mass, or TM.  Carotid pulses 2+ bilaterally, without bruits. CV: RRR, 3/6 systolic murmur, with 1/6 diastolic murmur.  S1 and S2 clear.  No r/g.   LUNGS: CTA bilat, nonlabored resps, good aeration in all lung fields. ABD: soft, NT, ND, BS normal.  No hepatospenomegaly or mass.  No bruits. EXT: no clubbing, cyanosis, or edema.  Musculoskeletal: no joint swelling, erythema, warmth, or tenderness.  ROM of all joints intact. Skin - no sores or suspicious lesions or rashes or  color changes Rectal exam: negative without mass, lesions or tenderness, PROSTATE EXAM: smooth and symmetric without nodules or tenderness.   Pertinent labs:  Lab Results  Component Value Date   TSH 3.53 01/22/2014   Lab Results  Component Value Date   WBC 5.6 10/16/2015   HGB 14.5 10/16/2015   HCT 43.5 10/16/2015   MCV 85.6 10/16/2015   PLT 181 10/16/2015   Lab Results  Component Value Date   CREATININE 0.88 10/16/2015   BUN 14 10/16/2015   NA 141 10/16/2015   K 4.1 10/16/2015   CL 108 10/16/2015   CO2 23 10/16/2015   Lab Results  Component Value Date   ALT 17 08/13/2015   AST 19 08/13/2015   ALKPHOS 46 08/13/2015   BILITOT 0.9 08/13/2015   Lab Results  Component Value Date  CHOL 162 08/13/2015   Lab Results  Component Value Date   HDL 43.30 08/13/2015   Lab Results  Component Value Date   LDLCALC 101 (H) 08/13/2015   Lab Results  Component Value Date   TRIG 87.0 08/13/2015   Lab Results  Component Value Date   CHOLHDL 4 08/13/2015   Lab Results  Component Value Date   PSA 2.59 09/19/2015   PSA 2.16 01/22/2014    ASSESSMENT AND PLAN:   Health maintenance exam: Reviewed age and gender appropriate health maintenance issues (prudent diet, regular exercise, health risks of tobacco and excessive alcohol, use of seatbelts, fire alarms in home, use of sunscreen).  Also reviewed age and gender appropriate health screening as well as vaccine recommendations. Pneumovax 23 today.  UTD on flu vaccine. Not fasting today so I ordered CBC, CMET, FLP, and PSA future--he'll return for fasting labs. Prostate ca screening: DRE normal today, PSA to be drawn with future fasting labs. Colon ca screening: next colonoscopy due 2023.  CV: AV and MV issues noted, will follow CT surgery/cardiology recommendations.  Fortunately, patient is asymptomatic from cardiac standpoint.  An After Visit Summary was printed and given to the patient.  FOLLOW UP:  Return in about 1  year (around 09/18/2017) for annual CPE (fasting).  Signed:  Crissie Sickles, MD           09/18/2016

## 2016-09-20 DIAGNOSIS — R972 Elevated prostate specific antigen [PSA]: Secondary | ICD-10-CM

## 2016-09-20 HISTORY — DX: Elevated prostate specific antigen (PSA): R97.20

## 2016-09-23 ENCOUNTER — Telehealth: Payer: Self-pay | Admitting: *Deleted

## 2016-09-23 NOTE — Telephone Encounter (Signed)
Left message for pt to call back to discuss scheduling cardiac cath at the request of Dr Roxy Manns

## 2016-09-23 NOTE — Telephone Encounter (Signed)
-----   Message from Ned Clines sent at 09/22/2016 10:45 AM EDT ----- Dr Roxy Manns would like this patient scheduled for Cath before we set patient up to see Dr Roxy Manns.  Thanks  [employer]

## 2016-09-24 ENCOUNTER — Other Ambulatory Visit (INDEPENDENT_AMBULATORY_CARE_PROVIDER_SITE_OTHER): Payer: PPO

## 2016-09-24 DIAGNOSIS — Z125 Encounter for screening for malignant neoplasm of prostate: Secondary | ICD-10-CM | POA: Diagnosis not present

## 2016-09-24 DIAGNOSIS — Z0000 Encounter for general adult medical examination without abnormal findings: Secondary | ICD-10-CM | POA: Diagnosis not present

## 2016-09-24 LAB — LIPID PANEL
CHOLESTEROL: 135 mg/dL (ref 0–200)
HDL: 40.3 mg/dL (ref >39.00)
LDL Cholesterol: 82 mg/dL (ref 0–99)
NonHDL: 95.19
Total CHOL/HDL Ratio: 3
Triglycerides: 67 mg/dL (ref 0.0–149.0)
VLDL: 13.4 mg/dL (ref 0.0–40.0)

## 2016-09-24 LAB — COMPREHENSIVE METABOLIC PANEL
ALBUMIN: 3.8 g/dL (ref 3.5–5.2)
ALK PHOS: 45 U/L (ref 39–117)
ALT: 17 U/L (ref 0–53)
AST: 21 U/L (ref 0–37)
BILIRUBIN TOTAL: 0.6 mg/dL (ref 0.2–1.2)
BUN: 13 mg/dL (ref 6–23)
CO2: 30 mEq/L (ref 19–32)
CREATININE: 0.96 mg/dL (ref 0.40–1.50)
Calcium: 9.1 mg/dL (ref 8.4–10.5)
Chloride: 108 mEq/L (ref 96–112)
GFR: 82.7 mL/min (ref >60.00)
Glucose, Bld: 103 mg/dL — ABNORMAL HIGH (ref 70–99)
POTASSIUM: 4.4 meq/L (ref 3.5–5.1)
SODIUM: 144 meq/L (ref 135–145)
TOTAL PROTEIN: 6.4 g/dL (ref 6.0–8.3)

## 2016-09-24 LAB — CBC WITH DIFFERENTIAL/PLATELET
BASOS ABS: 0 10*3/uL (ref 0.0–0.1)
Basophils Relative: 0.5 % (ref 0.0–3.0)
Eosinophils Absolute: 0.2 10*3/uL (ref 0.0–0.7)
Eosinophils Relative: 3.3 % (ref 0.0–5.0)
HCT: 41.5 % (ref 39.0–52.0)
Hemoglobin: 14 g/dL (ref 13.0–17.0)
LYMPHS ABS: 2.1 10*3/uL (ref 0.7–4.0)
Lymphocytes Relative: 36.5 % (ref 12.0–46.0)
MCHC: 33.8 g/dL (ref 30.0–36.0)
MCV: 83.6 fl (ref 78.0–100.0)
MONO ABS: 0.4 10*3/uL (ref 0.1–1.0)
Monocytes Relative: 7 % (ref 3.0–12.0)
NEUTROS PCT: 52.7 % (ref 43.0–77.0)
Neutro Abs: 3.1 10*3/uL (ref 1.4–7.7)
Platelets: 209 10*3/uL (ref 150.0–400.0)
RBC: 4.96 Mil/uL (ref 4.22–5.81)
RDW: 14.8 % (ref 11.5–15.5)
WBC: 5.8 10*3/uL (ref 4.0–10.5)

## 2016-09-24 LAB — PSA, MEDICARE: PSA: 4.17 ng/mL — AB (ref 0.10–4.00)

## 2016-09-24 NOTE — Telephone Encounter (Signed)
Spoke with pt who states he was unaware he needed a cardiac cath.  He is willing to have it done but needs a Tuesday, Thursday or Friday to have it done.  Advised I will have it scheduled and c/b with date, time and instructions.  Plan:  - TCTS consult.  - with reduction in EF and severe valvular lesions, recommend surgery. (Mitral valve repair/replacement, Bentall AV).   - Will need cath prior.  - Discussed with patient and family.   Candee Furbish, MD

## 2016-09-25 ENCOUNTER — Encounter: Payer: Self-pay | Admitting: *Deleted

## 2016-09-25 NOTE — Telephone Encounter (Signed)
I attempted to reach the patient on his home & cell #'s to discuss cath instructions. I left a message for the patient to call on both #'s.

## 2016-09-25 NOTE — Telephone Encounter (Signed)
The patient called back to the office. I did go over his cath instructions with him for Monday 09/29/16 at 9:00 am. He verbalizes understanding of instructions. Letter also mailed to the patient- mailing address verified.

## 2016-09-25 NOTE — Addendum Note (Signed)
Addended by: Candee Furbish C on: 09/25/2016 06:01 AM   Modules accepted: Orders, SmartSet

## 2016-09-27 ENCOUNTER — Other Ambulatory Visit: Payer: Self-pay | Admitting: Family Medicine

## 2016-09-27 ENCOUNTER — Encounter: Payer: Self-pay | Admitting: Family Medicine

## 2016-09-27 DIAGNOSIS — R972 Elevated prostate specific antigen [PSA]: Secondary | ICD-10-CM

## 2016-09-29 ENCOUNTER — Encounter (HOSPITAL_COMMUNITY)
Admission: RE | Disposition: A | Discharge status: Home / Self Care | Payer: Self-pay | Source: Ambulatory Visit | Attending: Interventional Cardiology

## 2016-09-29 ENCOUNTER — Ambulatory Visit (HOSPITAL_COMMUNITY)
Admission: RE | Admit: 2016-09-29 | Discharge: 2016-09-29 | Disposition: A | Discharge status: Home / Self Care | Payer: PPO | Source: Ambulatory Visit | Attending: Interventional Cardiology | Admitting: Interventional Cardiology

## 2016-09-29 DIAGNOSIS — Z8249 Family history of ischemic heart disease and other diseases of the circulatory system: Secondary | ICD-10-CM | POA: Diagnosis not present

## 2016-09-29 DIAGNOSIS — Z87891 Personal history of nicotine dependence: Secondary | ICD-10-CM | POA: Insufficient documentation

## 2016-09-29 DIAGNOSIS — K219 Gastro-esophageal reflux disease without esophagitis: Secondary | ICD-10-CM | POA: Diagnosis not present

## 2016-09-29 DIAGNOSIS — I351 Nonrheumatic aortic (valve) insufficiency: Secondary | ICD-10-CM | POA: Diagnosis not present

## 2016-09-29 DIAGNOSIS — Q231 Congenital insufficiency of aortic valve: Secondary | ICD-10-CM | POA: Diagnosis not present

## 2016-09-29 DIAGNOSIS — Y840 Cardiac catheterization as the cause of abnormal reaction of the patient, or of later complication, without mention of misadventure at the time of the procedure: Secondary | ICD-10-CM | POA: Diagnosis not present

## 2016-09-29 DIAGNOSIS — I340 Nonrheumatic mitral (valve) insufficiency: Secondary | ICD-10-CM

## 2016-09-29 DIAGNOSIS — I723 Aneurysm of iliac artery: Secondary | ICD-10-CM | POA: Insufficient documentation

## 2016-09-29 DIAGNOSIS — I272 Pulmonary hypertension, unspecified: Secondary | ICD-10-CM | POA: Diagnosis not present

## 2016-09-29 DIAGNOSIS — Z8521 Personal history of malignant neoplasm of larynx: Secondary | ICD-10-CM | POA: Insufficient documentation

## 2016-09-29 DIAGNOSIS — E781 Pure hyperglyceridemia: Secondary | ICD-10-CM | POA: Insufficient documentation

## 2016-09-29 DIAGNOSIS — I97630 Postprocedural hematoma of a circulatory system organ or structure following a cardiac catheterization: Secondary | ICD-10-CM | POA: Insufficient documentation

## 2016-09-29 DIAGNOSIS — I712 Thoracic aortic aneurysm, without rupture: Secondary | ICD-10-CM | POA: Diagnosis not present

## 2016-09-29 DIAGNOSIS — I083 Combined rheumatic disorders of mitral, aortic and tricuspid valves: Secondary | ICD-10-CM | POA: Diagnosis not present

## 2016-09-29 DIAGNOSIS — Z7982 Long term (current) use of aspirin: Secondary | ICD-10-CM | POA: Insufficient documentation

## 2016-09-29 HISTORY — PX: CARDIAC CATHETERIZATION: SHX172

## 2016-09-29 HISTORY — PX: PERIPHERAL VASCULAR CATHETERIZATION: SHX172C

## 2016-09-29 LAB — POCT I-STAT 3, VENOUS BLOOD GAS (G3P V)
BICARBONATE: 26.5 mmol/L (ref 20.0–28.0)
O2 Saturation: 72 %
PH VEN: 7.341 (ref 7.250–7.430)
TCO2: 28 mmol/L (ref 0–100)
pCO2, Ven: 48.9 mmHg (ref 44.0–60.0)
pO2, Ven: 40 mmHg (ref 32.0–45.0)

## 2016-09-29 LAB — POCT I-STAT 3, ART BLOOD GAS (G3+)
Bicarbonate: 25.2 mmol/L (ref 20.0–28.0)
O2 Saturation: 97 %
PH ART: 7.381 (ref 7.350–7.450)
TCO2: 27 mmol/L (ref 0–100)
pCO2 arterial: 42.5 mmHg (ref 32.0–48.0)
pO2, Arterial: 91 mmHg (ref 83.0–108.0)

## 2016-09-29 LAB — PROTIME-INR
INR: 1.06
Prothrombin Time: 13.9 seconds (ref 11.4–15.2)

## 2016-09-29 SURGERY — RIGHT/LEFT HEART CATH AND CORONARY ANGIOGRAPHY

## 2016-09-29 MED ORDER — SODIUM CHLORIDE 0.9 % WEIGHT BASED INFUSION
3.0000 mL/kg/h | INTRAVENOUS | Status: DC
  Administered 2016-09-29: 3 mL/kg/h via INTRAVENOUS

## 2016-09-29 MED ORDER — VERAPAMIL HCL 2.5 MG/ML IV SOLN
INTRAVENOUS | Status: AC
  Filled 2016-09-29: qty 2

## 2016-09-29 MED ORDER — LIDOCAINE HCL (PF) 1 % IJ SOLN
INTRAMUSCULAR | Status: DC | PRN
  Administered 2016-09-29: 3 mL

## 2016-09-29 MED ORDER — MIDAZOLAM HCL 2 MG/2ML IJ SOLN
INTRAMUSCULAR | Status: AC
  Filled 2016-09-29: qty 2

## 2016-09-29 MED ORDER — SODIUM CHLORIDE 0.9% FLUSH: 3.0000 mL | INTRAVENOUS | Status: DC | PRN

## 2016-09-29 MED ORDER — SODIUM CHLORIDE 0.9 % WEIGHT BASED INFUSION: 1.0000 mL/kg/h | INTRAVENOUS | Status: DC

## 2016-09-29 MED ORDER — FENTANYL CITRATE (PF) 100 MCG/2ML IJ SOLN
INTRAMUSCULAR | Status: AC
  Filled 2016-09-29: qty 2

## 2016-09-29 MED ORDER — SODIUM CHLORIDE 0.9 % IV SOLN: 250.0000 mL | INTRAVENOUS | Status: DC | PRN

## 2016-09-29 MED ORDER — VERAPAMIL HCL 2.5 MG/ML IV SOLN
INTRAVENOUS | Status: DC | PRN
  Administered 2016-09-29: 10 mL via INTRA_ARTERIAL

## 2016-09-29 MED ORDER — IOPAMIDOL (ISOVUE-370) INJECTION 76%
INTRAVENOUS | Status: AC
  Filled 2016-09-29: qty 100

## 2016-09-29 MED ORDER — LIDOCAINE HCL (PF) 1 % IJ SOLN
INTRAMUSCULAR | Status: AC
  Filled 2016-09-29: qty 30

## 2016-09-29 MED ORDER — HEPARIN SODIUM (PORCINE) 1000 UNIT/ML IJ SOLN
INTRAMUSCULAR | Status: AC
  Filled 2016-09-29: qty 1

## 2016-09-29 MED ORDER — IOPAMIDOL (ISOVUE-370) INJECTION 76%
INTRAVENOUS | Status: DC | PRN
  Administered 2016-09-29: 140 mL via INTRA_ARTERIAL

## 2016-09-29 MED ORDER — SODIUM CHLORIDE 0.9% FLUSH: 3.0000 mL | Freq: Two times a day (BID) | INTRAVENOUS | Status: DC

## 2016-09-29 MED ORDER — ASPIRIN 81 MG PO CHEW
81.0000 mg | CHEWABLE_TABLET | ORAL | Status: AC
  Administered 2016-09-29: 81 mg via ORAL

## 2016-09-29 MED ORDER — HEPARIN SODIUM (PORCINE) 1000 UNIT/ML IJ SOLN
INTRAMUSCULAR | Status: DC | PRN
  Administered 2016-09-29: 5000 [IU] via INTRAVENOUS

## 2016-09-29 MED ORDER — HEPARIN (PORCINE) IN NACL 2-0.9 UNIT/ML-% IJ SOLN
INTRAMUSCULAR | Status: AC
  Filled 2016-09-29: qty 1000

## 2016-09-29 MED ORDER — FENTANYL CITRATE (PF) 100 MCG/2ML IJ SOLN
INTRAMUSCULAR | Status: DC | PRN
  Administered 2016-09-29: 50 ug via INTRAVENOUS

## 2016-09-29 MED ORDER — HEPARIN (PORCINE) IN NACL 2-0.9 UNIT/ML-% IJ SOLN
INTRAMUSCULAR | Status: DC | PRN
  Administered 2016-09-29: 1000 mL

## 2016-09-29 MED ORDER — MIDAZOLAM HCL 2 MG/2ML IJ SOLN
INTRAMUSCULAR | Status: DC | PRN
  Administered 2016-09-29: 2 mg via INTRAVENOUS

## 2016-09-29 MED ORDER — SODIUM CHLORIDE 0.9 % IV SOLN: INTRAVENOUS | Status: AC

## 2016-09-29 MED ORDER — ASPIRIN 81 MG PO CHEW
CHEWABLE_TABLET | ORAL | Status: AC
  Administered 2016-09-29: 81 mg via ORAL
  Filled 2016-09-29: qty 1

## 2016-09-29 SURGICAL SUPPLY — 16 items
CATH BALLN WEDGE 5F 110CM (CATHETERS) ×2 IMPLANT
CATH INFINITI 5 FR 3DRC (CATHETERS) ×2 IMPLANT
CATH INFINITI 5 FR JL3.5 (CATHETERS) ×2 IMPLANT
CATH INFINITI 5FR ANG PIGTAIL (CATHETERS) ×2 IMPLANT
CATH INFINITI JR4 5F (CATHETERS) ×2 IMPLANT
DEVICE RAD COMP TR BAND LRG (VASCULAR PRODUCTS) ×2 IMPLANT
GLIDESHEATH SLEND SS 6F .021 (SHEATH) ×2 IMPLANT
KIT HEART LEFT (KITS) ×2 IMPLANT
PACK CARDIAC CATHETERIZATION (CUSTOM PROCEDURE TRAY) ×2 IMPLANT
SHEATH FAST CATH BRACH 5F 5CM (SHEATH) ×2 IMPLANT
SYR MEDRAD MARK V 150ML (SYRINGE) ×2 IMPLANT
TRANSDUCER W/STOPCOCK (MISCELLANEOUS) ×2 IMPLANT
TUBING CIL FLEX 10 FLL-RA (TUBING) ×2 IMPLANT
WIRE EMERALD 3MM-J .025X260CM (WIRE) IMPLANT
WIRE EMERALD 3MM-J .035X260CM (WIRE) ×2 IMPLANT
WIRE HI TORQ VERSACORE-J 145CM (WIRE) ×2 IMPLANT

## 2016-09-29 NOTE — Progress Notes (Signed)
Received from cath lab via stretcher; hematoma noted right radial and Gina held pressure on site for 61min and right radial softer

## 2016-09-29 NOTE — Interval H&P Note (Signed)
Cath Lab Visit (complete for each Cath Lab visit)  Clinical Evaluation Leading to the Procedure:   ACS: No.  Non-ACS:    Anginal Classification: CCS III  Anti-ischemic medical therapy: Minimal Therapy (1 class of medications)  Non-Invasive Test Results: Intermediate-risk stress test findings: cardiac mortality 1-3%/year  Prior CABG: No previous CABG  Preoperative evaluation before double valve surgery.    History and Physical Interval Note:  09/29/2016 8:48 AM  [name]  has presented today for surgery, with the diagnosis of mr  The various methods of treatment have been discussed with the patient and family. After consideration of risks, benefits and other options for treatment, the patient has consented to  Procedure(s): Right/Left Heart Cath and Coronary Angiography (N/A) as a surgical intervention .  The patient's history has been reviewed, patient examined, no change in status, stable for surgery.  I have reviewed the patient's chart and labs.  Questions were answered to the patient's satisfaction.     Larae Grooms

## 2016-09-29 NOTE — Discharge Instructions (Signed)
Radial Site Care °Refer to this sheet in the next few weeks. These instructions provide you with information about caring for yourself after your procedure. Your health care provider may also give you more specific instructions. Your treatment has been planned according to current medical practices, but problems sometimes occur. Call your health care provider if you have any problems or questions after your procedure. °WHAT TO EXPECT AFTER THE PROCEDURE °After your procedure, it is typical to have the following: °· Bruising at the radial site that usually fades within 1-2 weeks. °· Blood collecting in the tissue (hematoma) that may be painful to the touch. It should usually decrease in size and tenderness within 1-2 weeks. °HOME CARE INSTRUCTIONS °· Take medicines only as directed by your health care provider. °· You may shower 24-48 hours after the procedure or as directed by your health care provider. Remove the bandage (dressing) and gently wash the site with plain soap and water. Pat the area dry with a clean towel. Do not rub the site, because this may cause bleeding. °· Do not take baths, swim, or use a hot tub until your health care provider approves. °· Check your insertion site every day for redness, swelling, or drainage. °· Do not apply powder or lotion to the site. °· Do not flex or bend the affected arm for 24 hours or as directed by your health care provider. °· Do not push or pull heavy objects with the affected arm for 24 hours or as directed by your health care provider. °· Do not lift over 10 lb (4.5 kg) for 5 days after your procedure or as directed by your health care provider. °· Ask your health care provider when it is okay to: °¨ Return to work or school. °¨ Resume usual physical activities or sports. °¨ Resume sexual activity. °· Do not drive home if you are discharged the same day as the procedure. Have someone else drive you. °· You may drive 24 hours after the procedure unless otherwise  instructed by your health care provider. °· Do not operate machinery or power tools for 24 hours after the procedure. °· If your procedure was done as an outpatient procedure, which means that you went home the same day as your procedure, a responsible adult should be with you for the first 24 hours after you arrive home. °· Keep all follow-up visits as directed by your health care provider. This is important. °SEEK MEDICAL CARE IF: °· You have a fever. °· You have chills. °· You have increased bleeding from the radial site. Hold pressure on the site. °SEEK IMMEDIATE MEDICAL CARE IF: °· You have unusual pain at the radial site. °· You have redness, warmth, or swelling at the radial site. °· You have drainage (other than a small amount of blood on the dressing) from the radial site. °· The radial site is bleeding, and the bleeding does not stop after 30 minutes of holding steady pressure on the site. °· Your arm or hand becomes pale, cool, tingly, or numb. °  °This information is not intended to replace advice given to you by your health care provider. Make sure you discuss any questions you have with your health care provider. °  °Document Released: 01/09/2011 Document Revised: 12/28/2014 Document Reviewed: 06/25/2014 °Elsevier Interactive Patient Education ©2016 [employer] Inc[employer] ° °

## 2016-09-29 NOTE — H&P (View-Only) (Signed)
Cardiology Office Note   Date:  09/16/2016   ID:  Kenneth Ryan, DOB February 10, 1946, MRN [id number]  PCP:  Tammi Sou, MD  Cardiologist:   Candee Furbish, MD       History of Present Illness: Kenneth Ryan is a 70 y.o. male who presents for evaluation of  of bicuspid aortic valve and moderate to severe mitral regurgitation.    Aortic root has been rarely stable at 48-49 mm. CT scan has been performed as well as echocardiogram.  Most recent echocardiogram however did demonstrate a reduction in his ejection fraction and more severity in his mitral regurgitation. We will evaluate with TEE.  He does not describe any increasing dyspnea on exertion, no chest pain, no syncope, no bleeding. He understands that eventually he will need surgery.  Father had CABG. No valve replacement.    Past Medical History:  Diagnosis Date  . Bicuspid aortic valve 07/2015  . Chronic hoarseness    secondary to hx of laryngeal surgery  . GERD (gastroesophageal reflux disease)   . Headache   . Heart murmur   . History of tobacco abuse   . Hypertriglyceridemia   . Laryngeal cancer [facility]) 2002   Surgery and radiation---He had post treatment surveillance x 12 yrs and was officially "released" from f/u in summer 2014.  Marland Kitchen Lesion of vocal cord   . Mitral regurgitation 07/2015   moderate  . PONV (postoperative nausea and vomiting)   . Thoracic ascending aortic aneurysm (HCC)    48 mm 09/2015    Past Surgical History:  Procedure Laterality Date  . CHOLECYSTECTOMY  1980  . COLONOSCOPY  2003 and 2013   Both normal.  . DIRECT LARYNGOSCOPY Left 10/17/2015   Procedure: MICRO DIRECT LARYNGOSCOPY WITH FROZEN SECTION;  Surgeon: Jodi Marble, MD;  Location: [place];  Service: ENT;  Laterality: Left;  . TONSILECTOMY/ADENOIDECTOMY WITH MYRINGOTOMY  1960  . TRANSTHORACIC ECHOCARDIOGRAM  07/2015   Bicuspid aortic valve + mod mitral regurg  . Vocal cord biopsy  09/2015   No atypia or inflammation.  Some  fibrous changes that are possibly from hx of radiation therapy.     Current Outpatient Prescriptions  Medication Sig Dispense Refill  . aspirin 81 MG tablet Take 81 mg by mouth daily.    . fenofibrate (TRICOR) 145 MG tablet TAKE 1 TABLET(145 MG) BY MOUTH DAILY 90 tablet 3  . Multiple Vitamins-Minerals (MULTIVITAMIN PO) Take 1 tablet by mouth daily.     . pantoprazole (PROTONIX) 40 MG tablet TAKE 1 TABLET BY MOUTH EVERY DAY 90 tablet 3  . metoprolol succinate (TOPROL XL) 25 MG 24 hr tablet Take 1 tablet (25 mg total) by mouth daily. 90 tablet 3  . predniSONE (DELTASONE) 20 MG tablet 2 tabs po qd x 5d, then 1 tab po qd x 5d (Patient not taking: Reported on 09/16/2016) 15 tablet 0   No current facility-administered medications for this visit.     Allergies:   Review of patient's allergies indicates no known allergies.    Social History:  The patient  reports that he quit smoking about 37 years ago. He has never used smokeless tobacco. He reports that he drinks alcohol. He reports that he does not use drugs.   Family History:  The patient's family history includes Cancer in his mother; Heart disease in his father.    ROS:  Please see the history of present illness.   Otherwise, review of systems are positive for chronic congestion, hoarseness.  All other systems are reviewed and negative.    PHYSICAL EXAM: VS:  BP 136/80   Pulse 94   Ht 6' (1.829 m)   Wt 211 lb 4 oz (95.8 kg)   BMI 28.65 kg/m  , BMI Body mass index is 28.65 kg/m. GEN: Well nourished, well developed, in no acute distress HEENT: normal Neck: no JVD, positive carotid bruits, likely radiation of aortic murmur, or masses, abrupt upstroke Cardiac: RRR; 3/6 systolic mid peaking crescendo decrescendo murmur, also murmur heard at apex holosystolic, no rubs, or gallops,no edema  Respiratory:  clear to auscultation bilaterally, normal work of breathing GI: soft, nontender, nondistended, + BS MS: no deformity or  atrophy Skin: warm and dry, no rash Neuro:  Strength and sensation are intact Psych: euthymic mood, full affect   EKG:  Today's EKG 09/16/16-sinus rhythm, 94, LVH, no other abnormalities. 09/17/15-sinus rhythm, 77, no other abnormalities.  Echo: 08/16/15 - Left ventricle: The cavity size was mildly dilated. Wall thickness was normal. Systolic function was normal. The estimated ejection fraction was in the range of 55% to 60%. - Aortic valve: Functionally bicuspid; severely calcified leaflets. There was mild stenosis. There was mild regurgitation. Valve area (VTI): 2.02 cm^2. Valve area (Vmax): 1.68 cm^2. Valve area (Vmean): 1.75 cm^2. - Aorta: Moderately dilated Given bicuspid appearing valve consider cardiac CT to further size aorta and look at valve - Mitral valve: Calcified annulus. Mildly thickened leaflets . There was moderate regurgitation. Valve area by continuity equation (using LVOT flow): 2.44 cm^2. - Left atrium: The atrium was mildly to moderately dilated. - Atrial septum: No defect or patent foramen ovale was identified.  Echocardiogram: 08/28/16 - Left ventricle: The cavity size was moderately dilated. Wall   thickness was normal. Systolic function was mildly to moderately   reduced. The estimated ejection fraction was in the range of 40%   to 45%. Features are consistent with a pseudonormal left   ventricular filling pattern, with concomitant abnormal relaxation   and increased filling pressure (grade 2 diastolic dysfunction). - Aortic valve: There was mild regurgitation. - Mitral valve: There was severe regurgitation. (Personally viewed appears more moderate to severe)  - Left atrium: The atrium was moderately dilated. Recent Labs: 10/16/2015: BUN 14; Creatinine, Ser 0.88; Hemoglobin 14.5; Platelets 181; Potassium 4.1; Sodium 141    Lipid Panel    Component Value Date/Time   CHOL 162 08/13/2015 0839   TRIG 87.0 08/13/2015 0839   HDL 43.30  08/13/2015 0839   CHOLHDL 4 08/13/2015 0839   VLDL 17.4 08/13/2015 0839   LDLCALC 101 (H) 08/13/2015 0839      Wt Readings from Last 3 Encounters:  09/16/16 211 lb 4 oz (95.8 kg)  02/28/16 211 lb (95.7 kg)  10/17/15 207 lb (93.9 kg)      Other studies Reviewed: Additional studies/ records that were reviewed today include: Office note, EKG, lab work. Review of the above records demonstrates: As above   ASSESSMENT AND PLAN:  1.  Bicuspid aortic valve-Mild to moderate aortic valve stenosis at this point but severely calcified. Mild regurgitation previously reported on most recent echocardiogram. Congenital abnormality. His father did not have valve replacement. He has had a lifelong murmur. Does not seem to have any symptoms were regarding this condition. We will continue to monitor closely. He knows to contact me if signs or symptoms develop such as shortness of breath or anginal discomfort. No syncope. Continue healthy lifestyle. Exercise. If symptoms do develop, we will have low threshold for stress  test as well.  2. Dilated aortic root-avoid extreme lifting exercises/power lifting. Blood pressure is under good control. If blood pressure elevates, I would add beta blocker to reduce shear stress. Currently 48-9 mm. we will go ahead and refer to cardio thoracic surgery for further discussion. At some point he may need aortic root replacement, possible aortic valve replacement and now with his mitral valve demonstrating more significant regurgitation, mitral valve repair. We will check a TEE.  Mitral valve regurgitation  - Was previously mild, more significant on most current echocardiogram. Appears to be moderate to severe. Further quantification I will perform a transesophageal echocardiogram. This will also allow [place] to measure the aortic root as well as analyze his bicuspid aortic valve at the same time. He reports that he is not having any increasing shortness of breath however his most  recent echocardiogram showed an EF of 40-45%. We are starting Toprol 25 mg. Cardiothoracic surgery consultation will be helpful. We will set up.  3. Hypertriglyceridemia-baseline triglycerides in the 400 range. Excellent response to fibrates. Continue.  4. History of laryngeal cancer-early 2000's. If congestion does not improve, consider further ENT evaluation as directed by Dr. Ernestine Conrad, his primary physician.   Current medicines are reviewed at length with the patient today.  The patient does not have concerns regarding medicines.   Disposition:   FU with Skains in 6 months. We will discuss results of testing however prior to this. Cardiothoracic surgery evaluation as well. TEE.  Signed, Candee Furbish, MD  09/16/2016 11:27 AM    [facility] [address], [place], [place]  [address] Phone: [phone]; Fax: [phone]

## 2016-09-29 NOTE — Progress Notes (Signed)
Called to short stay for tr band swelling. Right A/C sheath removed and dressed with tegaderm. Blood pressure cuff applied and tr band moved proximal. TR band reinflated to 10cc air. No s+s of bleeding/swelling.  Post instructions given. Spoe 98% as measured in right thumb. Capillary refill less 2 seconds.

## 2016-09-29 NOTE — Progress Notes (Signed)
Right radial with hematoma noted and pressure held and called Aaron Edelman and Aaron Edelman in

## 2016-09-30 ENCOUNTER — Encounter (HOSPITAL_COMMUNITY): Payer: Self-pay | Admitting: Interventional Cardiology

## 2016-10-06 ENCOUNTER — Encounter: Payer: PPO | Admitting: Thoracic Surgery (Cardiothoracic Vascular Surgery)

## 2016-10-09 ENCOUNTER — Encounter: Payer: PPO | Admitting: Thoracic Surgery (Cardiothoracic Vascular Surgery)

## 2016-10-11 ENCOUNTER — Encounter: Payer: Self-pay | Admitting: Thoracic Surgery (Cardiothoracic Vascular Surgery)

## 2016-10-12 ENCOUNTER — Institutional Professional Consult (permissible substitution) (INDEPENDENT_AMBULATORY_CARE_PROVIDER_SITE_OTHER): Payer: PPO | Admitting: Thoracic Surgery (Cardiothoracic Vascular Surgery)

## 2016-10-12 ENCOUNTER — Other Ambulatory Visit: Payer: Self-pay | Admitting: *Deleted

## 2016-10-12 ENCOUNTER — Encounter: Payer: Self-pay | Admitting: Thoracic Surgery (Cardiothoracic Vascular Surgery)

## 2016-10-12 VITALS — BP 139/68 | HR 61 | Resp 18 | Ht 72.0 in | Wt 211.0 lb

## 2016-10-12 DIAGNOSIS — I719 Aortic aneurysm of unspecified site, without rupture: Secondary | ICD-10-CM | POA: Diagnosis not present

## 2016-10-12 DIAGNOSIS — I340 Nonrheumatic mitral (valve) insufficiency: Secondary | ICD-10-CM | POA: Diagnosis not present

## 2016-10-12 DIAGNOSIS — Q231 Congenital insufficiency of aortic valve: Secondary | ICD-10-CM | POA: Diagnosis not present

## 2016-10-12 DIAGNOSIS — I7121 Aneurysm of the ascending aorta, without rupture: Secondary | ICD-10-CM

## 2016-10-12 DIAGNOSIS — I341 Nonrheumatic mitral (valve) prolapse: Secondary | ICD-10-CM | POA: Diagnosis not present

## 2016-10-12 DIAGNOSIS — I351 Nonrheumatic aortic (valve) insufficiency: Secondary | ICD-10-CM

## 2016-10-12 NOTE — Progress Notes (Signed)
[address]       [place] [address]             [phone]     CARDIOTHORACIC SURGERY CONSULTATION REPORT  Referring Provider is Jerline Pain, MD PCP is Tammi Sou, MD  Chief Complaint  Patient presents with  . Thoracic Aortic Aneurysm    Surgical eval, Cardiac Cath 09/29/16, TEE 09/17/16,Gated Cardiac CT 09/22/16  . Aortic Insuffiency  . 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 The patient states that he has known he had a heart murmur all of his life. He moved to [place] approximately three years ago, and prior to that he had never been formally evaluated by a cardiologist. He was noted to have a prominent murmur on physical exam and referred for consultation last fall when he was first evaluated by Dr. [name]. The patient reported to be completely asymptomatic at that time, and echocardiogram revealed what was felt to be moderate aortic stenosis and moderate mitral regurgitation. Cardiac gated CT angiogram of the heart was performed to evaluate the aortic root aneurysm which measured less than 5 cm in its greatest diameter.  The patient was seen in follow-up recently by Dr. [name] and repeat echocardiogram revealed a significant drop in left ventricular ejection fraction with increased mitral regurgitation. The patient subsequently underwent transesophageal echocardiogram 09/17/2016. This confirmed the presence of a functionally bicuspid aortic valve with moderate to severe aortic insufficiency. There was mitral valve prolapse with severe mitral regurgitation caused by multiple ruptured chordae tendineae from the middle scallop of the anterior leaflet.  Left ventricular function was reduced with ejection fraction estimated 40-45%. Left and  right heart catheterization was performed 09/29/2016. This revealed no significant coronary artery disease and mild pulmonary hypertension. There were large V waves consistent with severe mitral regurgitation and severe aortic insufficiency. The patient was referred for surgical consultation.  The patient is married and lives with his wife in [place].  He has been retired since 2014 when he had his wife moved from [place] to [place] to be near their grandchildren.  He admits that he has never been very active physically. He does not exercise on a regular basis but he reports no significant physical limitations. 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 He states that he does experience mild exertional shortness of breath but this occurs only one he walks up his driveway taking out the trash. With ordinary activities around the house he has no shortness of breath. He states that in the past when he used to go to the gym to exercise he might get some tightness across his chest walking on a treadmill. He has not done this in quite some time. He denies any palpitations, dizzy spells, or syncope. He denies any history of resting shortness of breath, PND, or orthopnea. He does get mild lower extremity edema when he has been on his feet for a long time.   Past Medical History:  Diagnosis Date  . Aortic insufficiency due to bicuspid aortic valve   . Aortic root aneurysm (HCC)    4.8 cm by cardiac gated CTA  . Bicuspid aortic valve    TEE 08/2016 mod/sev AR w/functional bicuspid AV  . 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 GERD (gastroesophageal reflux disease)   . Headache   . Heart murmur   . History of tobacco abuse   . Hypertriglyceridemia   . Increased prostate specific antigen (PSA) velocity 09/2016   referred to urology 09/27/16  . 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  Marland Kitchen Lesion of vocal cord   . Mitral regurgitation    severe by TEE 08/2016  . Mitral valve prolapse   . PONV (postoperative nausea and vomiting)   . Thoracic ascending aortic aneurysm (HCC)    48 mm 09/2015    Past Surgical History:  Procedure Laterality Date  . CARDIAC CATHETERIZATION N/A 09/29/2016   Procedure: Right/Left Heart Cath and Coronary Angiography;  Surgeon: Jettie Booze, MD;  Location: [facility];  Service: Cardiovascular;  Laterality: N/A;  . CHOLECYSTECTOMY  1980  . COLONOSCOPY  Apr 07, 2002 and 04-07-2012   Both normal.  . DIRECT LARYNGOSCOPY Left 10/17/2015   Procedure: MICRO DIRECT LARYNGOSCOPY WITH FROZEN SECTION;  Surgeon: Jodi Marble, MD;  Location: [facility];  Service: ENT;  Laterality: Left;  . PERIPHERAL VASCULAR CATHETERIZATION N/A 09/29/2016   Procedure: Abdominal Aortogram;  Surgeon: Jettie Booze, MD;  Location: [facility];  Service: Cardiovascular;  Laterality: N/A;  . PERIPHERAL VASCULAR CATHETERIZATION N/A 09/29/2016   Procedure: Thoracic Aortogram;  Surgeon: Jettie Booze, MD;  Location: [facility];  Service: Cardiovascular;  Laterality: N/A;  . TEE WITHOUT CARDIOVERSION N/A 09/17/2016   Procedure: TRANSESOPHAGEAL ECHOCARDIOGRAM (TEE);  Surgeon: Jerline Pain, MD;  Location: [facility];  Service: Cardiovascular;  Laterality: N/A;  . TONSILECTOMY/ADENOIDECTOMY WITH MYRINGOTOMY  1960  . 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  Mod/severe AR w/functional bicuspid AV.  EF 45%.  . TRANSTHORACIC ECHOCARDIOGRAM  07/2015   Bicuspid aortic valve + mod mitral regurg  . Vocal cord biopsy  09/2015   No atypia or inflammation.  Some fibrous changes that are possibly from hx of radiation therapy.    Family History  Problem Relation Age of Onset  . Heart disease Father   . Cancer Mother     breast    Social History   Social History  . Marital  status: Married    Spouse name: N/A  . Number of children: N/A  . Years of education: N/A   Occupational History  . Not on file.   Social History Main Topics  . Smoking status: Former Smoker    Quit date: 07/22/1979  . Smokeless tobacco: Never Used  . Alcohol use Yes     Comment: rare  . Drug use: No  . Sexual activity: Not on file   Other Topics Concern  . Not on file   Social History Narrative   Married, one son deceased 04-08-2011.   Relocated from [place] to [facility] 06/2013 to be near grandchildren.   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   Smoker 20 pack-yr hx: quit about 1980.   Alcohol: 3 bottles of beer per week avg.     Drugs: none             Current Outpatient Prescriptions  Medication Sig Dispense Refill  . acetaminophen (TYLENOL) 500 MG tablet Take 500-1,000 mg by mouth every 6 (six) hours as needed for mild pain (depends on pain level if takes 500 mg or 1000 mg).     Marland Kitchen  aspirin 81 MG tablet Take 81 mg by mouth daily.    . fenofibrate (TRICOR) 145 MG tablet TAKE 1 TABLET(145 MG) BY MOUTH DAILY 90 tablet 3  . metoprolol succinate (TOPROL XL) 25 MG 24 hr tablet Take 1 tablet (25 mg total) by mouth daily. 90 tablet 3  . Multiple Vitamins-Minerals (MULTIVITAMIN PO) Take 1 tablet by mouth daily.     . pantoprazole (PROTONIX) 40 MG tablet TAKE 1 TABLET BY MOUTH EVERY DAY 90 tablet 3   No current facility-administered medications for this visit.     No Known Allergies    Review of Systems:   General:  normal appetite, normal energy, no weight gain, no weight loss, no fever  Cardiac:  + chest pain with exertion, no chest pain at rest, + SOB with more strenuous exertion, no resting SOB, no PND, no orthopnea, no palpitations, no arrhythmia, no atrial fibrillation, mild LE edema, no dizzy spells, no syncope  Respiratory:  no shortness of breath, no home oxygen, no productive cough, no dry cough, no bronchitis, no wheezing, no hemoptysis, no asthma, no pain with inspiration or cough,  no sleep apnea, no CPAP at night  GI:   no difficulty swallowing, no reflux, no frequent heartburn, no hiatal hernia, no abdominal pain, no constipation, no diarrhea, no hematochezia, no hematemesis, no melena  GU:   no dysuria,  no frequency, no urinary tract infection, no hematuria, no enlarged prostate, no kidney stones, no kidney disease  Vascular:  no pain suggestive of claudication, no pain in feet, no leg cramps, no varicose veins, no DVT, no non-healing foot ulcer  Neuro:   no stroke, no TIA's, no seizures, no headaches, no temporary blindness one eye,  no slurred speech, no peripheral neuropathy, no chronic pain, no instability of gait, no memory/cognitive dysfunction  Musculoskeletal: no arthritis, no joint swelling, no myalgias, no difficulty walking, normal mobility   Skin:   no rash, no itching, no skin infections, no pressure sores or ulcerations  Psych:   no anxiety, no depression, no nervousness, no unusual recent stress  Eyes:   no blurry vision, no floaters, no recent vision changes, + wears glasses or contacts  ENT:   no hearing loss, no loose or painful teeth, no dentures, last saw dentist 4 months ago  Hematologic:  no easy bruising, no abnormal bleeding, no clotting disorder, no frequent epistaxis  Endocrine:  no diabetes, does not check CBG's at home     Physical Exam:   BP 139/68 (BP Location: Right Arm, Patient Position: Sitting, Cuff Size: Normal)   Pulse 61   Resp 18   Ht 6' (1.829 m)   Wt 211 lb (95.7 kg)   SpO2 97% Comment: RA  BMI 28.62 kg/m   General:    well-appearing  HEENT:  Unremarkable   Neck:   no JVD, no bruits, no adenopathy   Chest:   clear to auscultation, symmetrical breath sounds, no wheezes, no rhonchi   CV:   RRR, grade IV/VI holosystolic murmur best LLSB and III/VI diastolic murmur  Abdomen:  soft, non-tender, no masses   Extremities:  warm, well-perfused, pulses palpable, no LE edema  Rectal/GU  Deferred  Neuro:   Grossly non-focal and  symmetrical throughout  Skin:   Clean and dry, no rashes, no breakdown   Diagnostic Tests:  Transthoracic Echocardiography  Patient:    Kong, Burkeen MR #:       [id number] Study Date: 08/28/2016 Gender:     M Age:  70 Height:     182.9 cm Weight:     95.7 kg BSA:        2.22 m^2 Pt. Status: Room:   ATTENDING    Candee Furbish, M.D.  ORDERING     Candee Furbish, M.D.  REFERRING    Candee Furbish, M.D.  SONOGRAPHER  Cindy Hazy, RDCS  PERFORMING   [facility], Outpatient  cc:  ------------------------------------------------------------------- LV EF: 40% -   45%  ------------------------------------------------------------------- Indications:      Q23.1 Bicuspid Aortic Valve.  I77.810 Dilated Aortic Root.  ------------------------------------------------------------------- History:   PMH:  Acquired from the patient and from the patient&'s chart.  PMH:  Bicuspid Aortic Valve.  Risk factors:  Former tobacco use.  ------------------------------------------------------------------- Study Conclusions  - Left ventricle: The cavity size was moderately dilated. Wall   thickness was normal. Systolic function was mildly to moderately   reduced. The estimated ejection fraction was in the range of 40%   to 45%. Features are consistent with a pseudonormal left   ventricular filling pattern, with concomitant abnormal relaxation   and increased filling pressure (grade 2 diastolic dysfunction). - Aortic valve: There was mild regurgitation. - Mitral valve: There was severe regurgitation. - Left atrium: The atrium was moderately dilated.  ------------------------------------------------------------------- Study data:   Study status:  Routine.  Procedure:  The patient reported no pain pre or post test. Transthoracic echocardiography for left ventricular function evaluation, for right ventricular function evaluation, and for assessment of valvular function. Image quality was  adequate.  Study completion:  There were no complications.          Transthoracic echocardiography.  M-mode, complete 2D, spectral Doppler, and color Doppler.  Birthdate: Patient birthdate: 03-05-1946.  Age:  Patient is 70 yr old.  Sex: Gender: male.    BMI: 28.6 kg/m^2.  Blood pressure:     122/62 Patient status:  Outpatient.  Study date:  Study date: 08/28/2016. Study time: 03:16 PM.  Location:  [facility]  -------------------------------------------------------------------  ------------------------------------------------------------------- Left ventricle:  The cavity size was moderately dilated. Wall thickness was normal. Systolic function was mildly to moderately reduced. The estimated ejection fraction was in the range of 40% to 45%. Features are consistent with a pseudonormal left ventricular filling pattern, with concomitant abnormal relaxation and increased filling pressure (grade 2 diastolic dysfunction).  ------------------------------------------------------------------- Aortic valve:  There is fusion of the RCC and LCC resulting in a functional bicuspid aortic valve. Severely thickened, mildly calcified leaflets.  Doppler:  There was mild regurgitation.    VTI ratio of LVOT to aortic valve: 0.4. Valve area (VTI): 2.12 cm^2. Indexed valve area (VTI): 0.95 cm^2/m^2. Peak velocity ratio of LVOT to aortic valve: 0.38. Valve area (Vmax): 2.01 cm^2. Indexed valve area (Vmax): 0.9 cm^2/m^2. Mean velocity ratio of LVOT to aortic valve: 0.41. Valve area (Vmean): 2.17 cm^2. Indexed valve area (Vmean): 0.97 cm^2/m^2. Mean gradient (S): 10 mm Hg. Peak gradient (S): 18 mm Hg.  ------------------------------------------------------------------- Aorta:  The aorta was moderately dilated.  ------------------------------------------------------------------- Mitral valve:   Mildly thickened leaflets .  Doppler:  There was severe regurgitation.    Peak gradient (D): 4 mm  Hg.  ------------------------------------------------------------------- Left atrium:  The atrium was moderately dilated.  ------------------------------------------------------------------- Right ventricle:  The cavity size was normal. Systolic function was normal.  ------------------------------------------------------------------- Pulmonic valve:    The valve appears to be grossly normal. Doppler:  There was no significant regurgitation.  ------------------------------------------------------------------- Tricuspid valve:   The valve appears to be grossly normal. Doppler:  There was no significant regurgitation.  ------------------------------------------------------------------- Right atrium:  The atrium was normal in size.  ------------------------------------------------------------------- Pericardium:  There was no pericardial effusion.   ------------------------------------------------------------------- Post procedure conclusions Ascending Aorta:  - The aorta was moderately dilated.  ------------------------------------------------------------------- Measurements   Left ventricle                            Value          Reference  LV ID, ED, PLAX chordal           (H)     63.3  mm       43 - 52  LV ID, ES, PLAX chordal           (H)     47.2  mm       23 - 38  LV fx shortening, PLAX chordal    (L)     25    %        >=29  LV PW thickness, ED                       8.69  mm       ---------  IVS/LV PW ratio, ED                       1.17           <=1.3  Stroke volume, 2D                         94    ml       ---------  Stroke volume/bsa, 2D                     42    ml/m^2   ---------  LV e&', lateral                            14.1  cm/s     ---------  LV E/e&', lateral                          7.23           ---------  LV e&', medial                             8.44  cm/s     ---------  LV E/e&', medial                           12.09           ---------  LV e&', average                            11.27 cm/s     ---------  LV E/e&', average                          9.05           ---------    Ventricular septum                        Value  Reference  IVS thickness, ED                         10.2  mm       ---------    LVOT                                      Value          Reference  LVOT ID, S                                26    mm       ---------  LVOT area                                 5.31  cm^2     ---------  LVOT ID                                   26    mm       ---------  LVOT peak velocity, S                     80.5  cm/s     ---------  LVOT mean velocity, S                     60.8  cm/s     ---------  LVOT VTI, S                               17.7  cm       ---------  LVOT peak gradient, S                     3     mm Hg    ---------  Stroke volume (SV), LVOT DP               94    ml       ---------  Stroke index (SV/bsa), LVOT DP            42.3  ml/m^2   ---------    Aortic valve                              Value          Reference  Aortic valve peak velocity, S             213   cm/s     ---------  Aortic valve mean velocity, S             149   cm/s     ---------  Aortic valve VTI, S                       44.4  cm       ---------  Aortic mean gradient, S                   10    mm Hg    ---------  Aortic peak gradient, S                   18    mm Hg    ---------  VTI ratio, LVOT/AV                        0.4            ---------  Aortic valve area, VTI                    2.12  cm^2     ---------  Aortic valve area/bsa, VTI                0.95  cm^2/m^2 ---------  Velocity ratio, peak, LVOT/AV             0.38           ---------  Aortic valve area, peak velocity          2.01  cm^2     ---------  Aortic valve area/bsa, peak               0.9   cm^2/m^2 ---------  velocity  Velocity ratio, mean, LVOT/AV             0.41           ---------  Aortic valve area, mean velocity          2.17  cm^2      ---------  Aortic valve area/bsa, mean               0.97  cm^2/m^2 ---------  velocity  Aortic regurg pressure half-time          470   ms       ---------    Aorta                                     Value          Reference  Aortic root ID, ED                        39    mm       ---------  Ascending aorta ID, A-P, S                49    mm       ---------    Left atrium                               Value          Reference  LA ID, A-P, ES                            58    mm       ---------  LA ID/bsa, A-P                    (H)     2.61  cm/m^2   <=2.2  LA volume, S                              88    ml       ---------  LA volume/bsa, S                          39.6  ml/m^2   ---------  LA volume, ES, 1-p A4C                    86    ml       ---------  LA volume/bsa, ES, 1-p A4C                38.7  ml/m^2   ---------  LA volume, ES, 1-p A2C                    89    ml       ---------  LA volume/bsa, ES, 1-p A2C                40    ml/m^2   ---------    Mitral valve                              Value          Reference  Mitral E-wave peak velocity               102   cm/s     ---------  Mitral A-wave peak velocity               107   cm/s     ---------  Mitral deceleration time                  201   ms       150 - 230  Mitral peak gradient, D                   4     mm Hg    ---------  Mitral E/A ratio, peak                    1              ---------  Mitral maximal regurg velocity,           572   cm/s     ---------  PISA  Mitral regurg VTI, PISA                   179   cm       ---------    Right ventricle                           Value          Reference  RV s&', lateral, S                         15.4  cm/s     ---------  Legend: (L)  and  (H)  mark values outside specified reference range.  ------------------------------------------------------------------- Prepared and Electronically Authenticated by  Mertie Moores,  M.D. 2017-09-08T16:40:23   Transesophageal Echocardiography  (Report amended )  Patient:    Artis, Southam MR #:       [id number] Study Date: 09/17/2016 Gender:     M Age:        3 Height:     182.9 cm Weight:     95.9 kg BSA:  2.23 m^2 Pt. Status: Room:   SONOGRAPHER  Florentina Jenny, RDCS  ADMITTING    Candee Furbish, M.D.  ATTENDING    Candee Furbish, M.D.  ORDERING     Candee Furbish, M.D.  PERFORMING   Candee Furbish, M.D.  REFERRING    Candee Furbish, M.D.  cc:  ------------------------------------------------------------------- LV EF: 40% -   45%  ------------------------------------------------------------------- Indications:      Mitral regurgitation 424.0.  ------------------------------------------------------------------- Study Conclusions  - Left ventricle: The cavity size was normal. Wall thickness was   normal. Systolic function was mildly to moderately reduced. The   estimated ejection fraction was in the range of 40% to 45%.   Diffuse hypokinesis. - Aortic valve: Functionally bicuspid; moderately thickened,   moderately calcified leaflets; fusion of the right-left coronary   commissure. There was moderate to severe regurgitation. - Aorta: The aorta was moderately dilated. Aortic root dimension:   48 mm (ED, M-mode). - Mitral valve: There was ruptured chordae resulting in partial   flail anterior leaflet. There was severe regurgitation directed   eccentrically and posteriorly. - Left atrium: No evidence of thrombus in the appendage. - Right atrium: No evidence of thrombus in the atrial cavity or   appendage. - Atrial septum: No defect or patent foramen ovale was identified. - Tricuspid valve: No evidence of vegetation. - Pulmonic valve: No evidence of vegetation. - Superior vena cava: The study excluded a thrombus.  Impressions:  - Surgery consultation. Discuss Bentall and mitral valve   repair/replacement. Concerned that EF is  reduced.  ------------------------------------------------------------------- Study data:   Study status:  Routine.  Consent:  The risks, benefits, and alternatives to the procedure were explained to the patient and informed consent was obtained.  Procedure:  The patient reported no pain pre or post test. Initial setup. The patient was brought to the laboratory. Surface ECG leads were monitored. Sedation. Conscious sedation was administered by cardiology staff. Transesophageal echocardiography. An adult multiplane transesophageal probe was inserted by the attending cardiologistwithout difficulty. Image quality was adequate.  Study completion:  The patient tolerated the procedure well. There were no complications.  Administered medications:   Fentanyl, 36mcg, IV.  Midazolam, 4mg , IV.          Diagnostic transesophageal echocardiography.  2D and color Doppler.  Birthdate:  Patient birthdate: 1946/03/05.  Age:  Patient is 70 yr old.  Sex:  Gender: male.    BMI: 28.7 kg/m^2.  Blood pressure:     129/45  Patient status:  Outpatient.  Study date:  Study date: 09/17/2016. Study time: 03:06 PM.  Location:  [facility].  -------------------------------------------------------------------  ------------------------------------------------------------------- Left ventricle:  The cavity size was normal. Wall thickness was normal. Systolic function was mildly to moderately reduced. The estimated ejection fraction was in the range of 40% to 45%. Diffuse hypokinesis.  ------------------------------------------------------------------- Aortic valve:   Functionally bicuspid; moderately thickened, moderately calcified leaflets; fusion of the right-left coronary commissure.  Doppler:   There was no stenosis.   There was moderate to severe regurgitation. Vena contracta was 0.58cm.  ------------------------------------------------------------------- Aorta:  The aorta was moderately  dilated.  ------------------------------------------------------------------- Mitral valve:   Mildly thickened leaflets . There was ruptured chordae resulting in partial flail anterior leaflet.  Doppler: There was severe regurgitation directed eccentrically and posteriorly.  ------------------------------------------------------------------- Left atrium:  The atrium was normal in size.  No evidence of thrombus in the appendage. The appendage was of normal size. Emptying velocity was normal.  ------------------------------------------------------------------- Atrial septum:  No defect or patent foramen ovale was  identified.   ------------------------------------------------------------------- Right ventricle:  The cavity size was normal. Wall thickness was normal. Systolic function was normal.  ------------------------------------------------------------------- Pulmonic valve:    Structurally normal valve.   Cusp separation was normal.  No evidence of vegetation.  ------------------------------------------------------------------- Tricuspid valve:   Structurally normal valve.   Leaflet separation was normal.  No evidence of vegetation.  Doppler:  There was trivial regurgitation.  ------------------------------------------------------------------- Pulmonary artery:   The main pulmonary artery was normal-sized.  ------------------------------------------------------------------- Right atrium:  The atrium was normal in size.  No evidence of thrombus in the atrial cavity or appendage.  ------------------------------------------------------------------- Pericardium:  The pericardium was normal in appearance. There was no pericardial effusion.  ------------------------------------------------------------------- Systemic veins: Superior vena cava: The study excluded a thrombus.  ------------------------------------------------------------------- Post procedure  conclusions Ascending Aorta:  - The aorta was moderately dilated.  ------------------------------------------------------------------- Measurements   Aorta                      Value    Reference  Aortic root ID, ED, MM (H) 48    mm 20 - 37  Legend: (L)  and  (H)  mark values outside specified reference range.  ------------------------------------------------------------------- Lovenia Kim, M.D. 2017-09-28T17:30:59    CARDIAC CATHETERIZATION  Abdominal Aortogram  Right/Left Heart Cath and Coronary Angiography  Thoracic Aortogram  Conclusion     The left ventricular ejection fraction is 55-65% by visual estimate. LV was visualized due to severe AI on supravalvular aortogram.  Hemodynamic findings consistent with mild pulmonary hypertension.  No significant CAD.  Prominent V waves consistent with severe mitral regurgitation.  No renal artery stenosis noted. No AAA.  There is severe (4+) aortic regurgitation.   Continue with plans for referral to Dr. Roxy Manns for valve surgery.     Indications   Mitral valve insufficiency, unspecified etiology [I34.0 (ICD-10-CM)]  Aortic valve insufficiency, etiology of cardiac valve disease unspecified [I35.1 (ICD-10-CM)]  Bicuspid aortic valve [Q23.1 (ICD-10-CM)]  Procedural Details/Technique   Technical Details The risks, benefits, and details of the procedure were explained to the patient. The patient verbalized understanding and wanted to proceed. Informed written consent was obtained.  PROCEDURE TECHNIQUE: After Xylocaine anesthesia a 56F slender sheath was placed in the right radial artery with a single anterior needle wall stick. IV Heparin was given. Right coronary angiography was done using a 3DRC guide catheter. Left coronary angiography was done using a Judkins L3.5 guide catheter. Left ventriculography was not done. The valve was not crossed. Supravalvular aortography and abdominal aortography were performed  with power injection of contrast using a pigtail catheter. A TR band was used for hemostasis.  Contrast: 140     Estimated blood loss <50 mL.  During this procedure the patient was administered the following to achieve and maintain moderate conscious sedation: Versed 2 mg, Fentanyl 50 mcg, while the patient's heart rate, blood pressure, and oxygen saturation were continuously monitored. The period of conscious sedation was 44 minutes, of which I was present face-to-face 100% of this time.    Complications   Complications documented before study signed (09/29/2016 10:00 AM EDT)    No complications were associated with this study.  Documented by Jettie Booze, MD - 09/29/2016 9:59 AM EDT    Coronary Findings   Dominance: Right  Left Anterior Descending  The vessel exhibits minimal luminal irregularities.  Left Circumflex  The vessel exhibits minimal luminal irregularities.  Vascular Findings   Abdominal Aorta Abdominal Aorta: The abdominal aorta is  not aneurysmal.   No renal artery stenosis noted.  Small right common iliac artery aneurysm.    Right Heart   Right Heart Pressures Hemodynamic findings consistent with mild pulmonary hypertension.    Left Heart   Left Ventricle The left ventricle is dilated. The left ventricular ejection fraction is 55-65% by visual estimate. No regional wall motion abnormalities. Findings noted on supravalvular aortography.    Aorta Aortic Root: The aortic root displays dilation. There is severe (4+) aortic regurgitation.    Coronary Diagrams   Diagnostic Diagram     Implants     No implant documentation for this case.  PACS Images   Show images for Cardiac catheterization   Link to Procedure Log   Procedure Log    Hemo Data   Flowsheet Row Most Recent Value  Fick Cardiac Output 6.09 L/min  Fick Cardiac Output Index 2.79 (L/min)/BSA  RA A Wave 13 mmHg  RA V Wave 12 mmHg  RA Mean 8 mmHg  RV Systolic Pressure 43 mmHg   RV Diastolic Pressure 7 mmHg  RV EDP 12 mmHg  PA Systolic Pressure 44 mmHg  PA Diastolic Pressure 21 mmHg  PA Mean 33 mmHg  PW A Wave 22 mmHg  PW V Wave 40 mmHg  PW Mean 24 mmHg  AO Systolic Pressure 123XX123 mmHg  AO Diastolic Pressure 55 mmHg  AO Mean 83 mmHg  QP/QS 1  TPVR Index 11.81 HRUI  TSVR Index 29.7 HRUI  PVR SVR Ratio 0.12  TPVR/TSVR Ratio 0.4       Impression:  Patient has functionally bicuspid aortic valve with very mild aortic stenosis, severe aortic insufficiency, and 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 He also has mitral valve prolapse with large flail segment of the anterior leaflet of the mitral valve and severe mitral regurgitation. Left ventricular systolic function is significantly reduced with ejection fraction estimated 40-45% in the setting of severe aortic insufficiency and severe mitral regurgitation.  The left ventricle is moderately dilated. Diagnostic cardiac catheterization is notable for the absence of significant coronary artery disease and mild pulmonary hypertension.  The patient tends to minimize symptoms but does admit to exertional shortness of breath associated with more strenuous physical exertion, such as walking up a steep incline. I agree the patient needs surgical intervention to consist of Bentall aortic root replacement and mitral valve repair.   Risks associated with elective surgery will be somewhat increased because of the magnitude of the surgical procedure and the presence of significant left ventricular systolic dysfunction. The patient is otherwise healthy and free of significant comorbid medical problems.   Plan:  I have discussed the nature of the patient's complex valvular heart disease at length with the patient and his wife in the office today. We discussed the indications, risks, and potential benefits of surgery as well as long-term prognosis without surgery but with close medical follow-up.   Surgical options were discussed at length.   Based upon review of the patient's transesophageal echocardiogram I feel there remains a fairly high likelihood that his mitral valve should be repairable, but the aortic valve will need to be replaced.   Discussion was held comparing the relative risks of mechanical aortic valve replacement with need for lifelong anticoagulation versus use of a bioprosthetic tissue valve and the associated potential for late structural valve deterioration and failure.  This discussion was placed in the context of the patient's particular circumstances, and as a result the patient specifically requests that  their valve be replaced using a mechanical valve.  The patient understands and accepts all potential associated risks of surgery including but not limited to risk of death, stroke, myocardial infarction, congestive heart failure, respiratory failure, renal failure, pneumonia, bleeding requiring blood transfusion and or reexploration, arrhythmia, heart block or bradycardia requiring permanent pacemaker, aortic dissection or other major vascular complication, pleural effusions or other delayed complications related to continued congestive heart failure, and other late complications related to valve replacement including structural valve deterioration and failure, thrombosis, endocarditis, or paravalvular leak.  All of his questions have been addressed. The patient is interested in proceeding with surgery in the near future. We tentatively plan for surgery on 11/03/2016. The patient will return for follow-up prior to surgery on 11/02/2016.   I spent in excess of 90 minutes during the conduct of this office consultation and >50% of this time involved direct face-to-face encounter with the patient for counseling and/or coordination of their care.    Valentina Gu. Roxy Manns, MD 10/12/2016 11:38 AM

## 2016-10-12 NOTE — Patient Instructions (Addendum)
Patient has been instructed to stop taking aspirin  Patient should continue taking all other medications without change through the day before surgery.  Patient should have nothing to eat or drink after midnight the night before surgery.  On the morning of surgery patient should take only Toprol and Protonix with a sip of water.

## 2016-10-13 ENCOUNTER — Encounter: Payer: Self-pay | Admitting: Family Medicine

## 2016-10-23 ENCOUNTER — Encounter: Payer: Self-pay | Admitting: Family Medicine

## 2016-10-23 ENCOUNTER — Ambulatory Visit (INDEPENDENT_AMBULATORY_CARE_PROVIDER_SITE_OTHER): Payer: PPO | Admitting: Family Medicine

## 2016-10-23 VITALS — BP 116/64 | HR 79 | Temp 99.2°F | Resp 17 | Ht 72.0 in | Wt 208.2 lb

## 2016-10-23 DIAGNOSIS — H66001 Acute suppurative otitis media without spontaneous rupture of ear drum, right ear: Secondary | ICD-10-CM | POA: Diagnosis not present

## 2016-10-23 DIAGNOSIS — J0100 Acute maxillary sinusitis, unspecified: Secondary | ICD-10-CM

## 2016-10-23 MED ORDER — AMOXICILLIN 875 MG PO TABS: 875.0000 mg | ORAL_TABLET | Freq: Two times a day (BID) | ORAL | 0 refills | Status: DC

## 2016-10-23 NOTE — Progress Notes (Signed)
Pre visit review using our clinic review tool, if applicable. No additional management support is needed unless otherwise documented below in the visit note. 

## 2016-10-23 NOTE — Patient Instructions (Signed)
Follow up as needed Start the Amoxicillin twice daily- take w/ food Drink plenty of fluids Mucinex DM to help thin your congestion and improve your cough Start Claritin or Zyrtec daily to improve the allergy component Call with any questions or concerns Hang in there!!!

## 2016-10-23 NOTE — Progress Notes (Signed)
   Subjective:    Patient ID: Kenneth Ryan, male    DOB: [birth year], 70 y.o.   MRN: [id number]  HPI URI- sxs started ~4 days ago w/ a runny nose.  'my nose is just pouring'.  Pt reports increased nasal congestion- somewhat improved w/ benadryl.  + sick contacts.  Pt has hx of bronchitis- some chest congestion.  Intermittent cough.  + sinus pain/pressure, + HA.  No ear pain.  No fever.   Review of Systems For ROS see HPI     Objective:   Physical Exam  Constitutional: He appears well-developed and well-nourished. No distress.  HENT:  Head: Normocephalic and atraumatic.  Right Ear: Tympanic membrane is erythematous. A middle ear effusion is present.  Left Ear: Tympanic membrane normal.  Nose: Mucosal edema and rhinorrhea present. Right sinus exhibits maxillary sinus tenderness and frontal sinus tenderness. Left sinus exhibits maxillary sinus tenderness and frontal sinus tenderness.  Mouth/Throat: Mucous membranes are normal. Oropharyngeal exudate and posterior oropharyngeal erythema present. No posterior oropharyngeal edema.  + PND  Eyes: Conjunctivae and EOM are normal. Pupils are equal, round, and reactive to light.  Neck: Normal range of motion. Neck supple.  Cardiovascular: Normal rate, regular rhythm and normal heart sounds.   Pulmonary/Chest: Effort normal and breath sounds normal. No respiratory distress. He has no wheezes.  Lymphadenopathy:    He has no cervical adenopathy.  Skin: Skin is warm and dry.  Vitals reviewed.         Assessment & Plan:  R OM/sinusitis- new.  Pt's sxs and PE consistent w/ infxn.  Start abx.  Reviewed supportive care and red flags that should prompt return.  Pt expressed understanding and is in agreement w/ plan.

## 2016-10-26 ENCOUNTER — Encounter: Payer: PPO | Admitting: Thoracic Surgery (Cardiothoracic Vascular Surgery)

## 2016-10-30 ENCOUNTER — Ambulatory Visit (HOSPITAL_COMMUNITY)
Admission: RE | Admit: 2016-10-30 | Discharge: 2016-10-30 | Disposition: A | Discharge status: Home / Self Care | Payer: PPO | Source: Ambulatory Visit | Attending: Thoracic Surgery (Cardiothoracic Vascular Surgery) | Admitting: Thoracic Surgery (Cardiothoracic Vascular Surgery)

## 2016-10-30 ENCOUNTER — Encounter (HOSPITAL_COMMUNITY): Payer: Self-pay

## 2016-10-30 ENCOUNTER — Other Ambulatory Visit (HOSPITAL_COMMUNITY): Payer: Self-pay | Admitting: *Deleted

## 2016-10-30 ENCOUNTER — Encounter (HOSPITAL_COMMUNITY)
Admission: RE | Admit: 2016-10-30 | Discharge: 2016-10-30 | Disposition: A | Discharge status: Home / Self Care | Payer: PPO | Source: Ambulatory Visit | Attending: Thoracic Surgery (Cardiothoracic Vascular Surgery) | Admitting: Thoracic Surgery (Cardiothoracic Vascular Surgery)

## 2016-10-30 ENCOUNTER — Ambulatory Visit (HOSPITAL_BASED_OUTPATIENT_CLINIC_OR_DEPARTMENT_OTHER)
Admission: RE | Admit: 2016-10-30 | Discharge: 2016-10-30 | Disposition: A | Discharge status: Home / Self Care | Payer: PPO | Source: Ambulatory Visit | Attending: Thoracic Surgery (Cardiothoracic Vascular Surgery) | Admitting: Thoracic Surgery (Cardiothoracic Vascular Surgery)

## 2016-10-30 DIAGNOSIS — I351 Nonrheumatic aortic (valve) insufficiency: Secondary | ICD-10-CM | POA: Diagnosis not present

## 2016-10-30 DIAGNOSIS — I719 Aortic aneurysm of unspecified site, without rupture: Secondary | ICD-10-CM

## 2016-10-30 DIAGNOSIS — I340 Nonrheumatic mitral (valve) insufficiency: Secondary | ICD-10-CM

## 2016-10-30 DIAGNOSIS — Z01818 Encounter for other preprocedural examination: Secondary | ICD-10-CM | POA: Diagnosis not present

## 2016-10-30 DIAGNOSIS — Z01812 Encounter for preprocedural laboratory examination: Secondary | ICD-10-CM | POA: Diagnosis not present

## 2016-10-30 DIAGNOSIS — Z0183 Encounter for blood typing: Secondary | ICD-10-CM | POA: Insufficient documentation

## 2016-10-30 DIAGNOSIS — R05 Cough: Secondary | ICD-10-CM | POA: Diagnosis not present

## 2016-10-30 DIAGNOSIS — I7121 Aneurysm of the ascending aorta, without rupture: Secondary | ICD-10-CM

## 2016-10-30 DIAGNOSIS — R9431 Abnormal electrocardiogram [ECG] [EKG]: Secondary | ICD-10-CM | POA: Diagnosis not present

## 2016-10-30 DIAGNOSIS — R0602 Shortness of breath: Secondary | ICD-10-CM | POA: Diagnosis not present

## 2016-10-30 HISTORY — DX: Basal cell carcinoma of skin, unspecified: C44.91

## 2016-10-30 LAB — VAS US DOPPLER PRE CABG
LCCADDIAS: -10 cm/s
LEFT ECA DIAS: -11 cm/s
LEFT VERTEBRAL DIAS: 12 cm/s
LICADSYS: -84 cm/s
Left CCA dist sys: -57 cm/s
Left CCA prox dias: -21 cm/s
Left CCA prox sys: -120 cm/s
Left ICA dist dias: -20 cm/s
Left ICA prox dias: -14 cm/s
Left ICA prox sys: -64 cm/s
RCCADSYS: -69 cm/s
RIGHT ECA DIAS: -13 cm/s
RIGHT VERTEBRAL DIAS: -12 cm/s
Right CCA prox dias: 12 cm/s
Right CCA prox sys: 88 cm/s

## 2016-10-30 LAB — URINALYSIS, ROUTINE W REFLEX MICROSCOPIC
Bilirubin Urine: NEGATIVE
GLUCOSE, UA: NEGATIVE mg/dL
Hgb urine dipstick: NEGATIVE
KETONES UR: NEGATIVE mg/dL
LEUKOCYTES UA: NEGATIVE
NITRITE: NEGATIVE
PROTEIN: NEGATIVE mg/dL
Specific Gravity, Urine: 1.024 (ref 1.005–1.030)
pH: 6 (ref 5.0–8.0)

## 2016-10-30 LAB — COMPREHENSIVE METABOLIC PANEL
ALK PHOS: 40 U/L (ref 38–126)
ALT: 20 U/L (ref 17–63)
AST: 25 U/L (ref 15–41)
Albumin: 3.8 g/dL (ref 3.5–5.0)
Anion gap: 8 (ref 5–15)
BUN: 12 mg/dL (ref 6–20)
CALCIUM: 9.5 mg/dL (ref 8.9–10.3)
CHLORIDE: 110 mmol/L (ref 101–111)
CO2: 22 mmol/L (ref 22–32)
CREATININE: 0.89 mg/dL (ref 0.61–1.24)
GFR calc non Af Amer: >60 mL/min (ref >60)
Glucose, Bld: 116 mg/dL — ABNORMAL HIGH (ref 65–99)
Potassium: 3.9 mmol/L (ref 3.5–5.1)
SODIUM: 140 mmol/L (ref 135–145)
Total Bilirubin: 0.6 mg/dL (ref 0.3–1.2)
Total Protein: 6.3 g/dL — ABNORMAL LOW (ref 6.5–8.1)

## 2016-10-30 LAB — PULMONARY FUNCTION TEST
DL/VA % PRED: 93 %
DL/VA: 4.42 ml/min/mmHg/L
DLCO unc % pred: 73 %
DLCO unc: 25.83 ml/min/mmHg
FEF 25-75 POST: 4.31 L/s
FEF 25-75 Pre: 4.05 L/sec
FEF2575-%CHANGE-POST: 6 %
FEF2575-%PRED-POST: 157 %
FEF2575-%Pred-Pre: 147 %
FEV1-%CHANGE-POST: 2 %
FEV1-%PRED-PRE: 86 %
FEV1-%Pred-Post: 88 %
FEV1-PRE: 3.08 L
FEV1-Post: 3.16 L
FEV1FVC-%CHANGE-POST: 4 %
FEV1FVC-%PRED-PRE: 112 %
FEV6-%Change-Post: 0 %
FEV6-%Pred-Post: 79 %
FEV6-%Pred-Pre: 79 %
FEV6-POST: 3.62 L
FEV6-Pre: 3.63 L
FEV6FVC-%Change-Post: 0 %
FEV6FVC-%PRED-POST: 105 %
FEV6FVC-%Pred-Pre: 105 %
FVC-%CHANGE-POST: -1 %
FVC-%PRED-POST: 75 %
FVC-%PRED-PRE: 76 %
FVC-POST: 3.62 L
FVC-PRE: 3.69 L
POST FEV1/FVC RATIO: 87 %
PRE FEV6/FVC RATIO: 100 %
Post FEV6/FVC ratio: 100 %
Pre FEV1/FVC ratio: 84 %
RV % pred: 83 %
RV: 2.12 L
TLC % pred: 82 %
TLC: 6.11 L

## 2016-10-30 LAB — PROTIME-INR
INR: 1.05
Prothrombin Time: 13.7 seconds (ref 11.4–15.2)

## 2016-10-30 LAB — APTT: aPTT: 32 seconds (ref 24–36)

## 2016-10-30 LAB — CBC
HCT: 41.6 % (ref 39.0–52.0)
HEMOGLOBIN: 14 g/dL (ref 13.0–17.0)
MCH: 28.4 pg (ref 26.0–34.0)
MCHC: 33.7 g/dL (ref 30.0–36.0)
MCV: 84.4 fL (ref 78.0–100.0)
Platelets: 180 10*3/uL (ref 150–400)
RBC: 4.93 MIL/uL (ref 4.22–5.81)
RDW: 14 % (ref 11.5–15.5)
WBC: 6 10*3/uL (ref 4.0–10.5)

## 2016-10-30 LAB — BLOOD GAS, ARTERIAL
Acid-Base Excess: 1.6 mmol/L (ref 0.0–2.0)
Bicarbonate: 25.6 mmol/L (ref 20.0–28.0)
DRAWN BY: 449841
FIO2: 21
O2 Saturation: 96.8 %
PCO2 ART: 39.3 mmHg (ref 32.0–48.0)
PO2 ART: 86.6 mmHg (ref 83.0–108.0)
Patient temperature: 98.6
pH, Arterial: 7.429 (ref 7.350–7.450)

## 2016-10-30 LAB — SURGICAL PCR SCREEN
MRSA, PCR: NEGATIVE
Staphylococcus aureus: NEGATIVE

## 2016-10-30 LAB — ABO/RH: ABO/RH(D): O POS

## 2016-10-30 MED ORDER — ALBUTEROL SULFATE (2.5 MG/3ML) 0.083% IN NEBU
2.5000 mg | INHALATION_SOLUTION | Freq: Once | RESPIRATORY_TRACT | Status: AC
  Administered 2016-10-30: 2.5 mg via RESPIRATORY_TRACT

## 2016-10-30 NOTE — Pre-Procedure Instructions (Signed)
Sunday Cappel  10/30/2016    Your procedure is scheduled on Tuesday, November 03, 2016 at 7:30 AM.   Report to [facility] Entrance "A" Admitting Office at 5:30 AM.   Call this number if you have problems the morning of surgery: [phone]   Questions prior to day of surgery, please call [phone] between 8 & 4 PM.   Remember:  Do not eat food or drink liquids after midnight Monday, 11/02/16.  Take these medicines the morning of surgery with A SIP OF WATER: Metoprolol (Toprol XL), Pantoprazole (Protonix), Tylenol - if needed  Stop Multivitamins as of today. Do not use Aspirin Products or NSAIDS (Ibuprofen, Aleve, etc.) prior to surgery.   Do not wear jewelry.  Do not wear lotions, powders, cologne or deodorant.  Men may shave face and neck.  Do not bring valuables to the hospital.  [facility] is not responsible for any belongings or valuables.  Contacts, dentures or bridgework may not be worn into surgery.  Leave your suitcase in the car.  After surgery it may be brought to your room.  For patients admitted to the hospital, discharge time will be determined by your treatment team.  Special instructions:  [facility] - Preparing for Surgery  Before surgery, you can play an important role.  Because skin is not sterile, your skin needs to be as free of germs as possible.  You can reduce the number of germs on you skin by washing with CHG (chlorahexidine gluconate) soap before surgery.  CHG is an antiseptic cleaner which kills germs and bonds with the skin to continue killing germs even after washing.  Please DO NOT use if you have an allergy to CHG or antibacterial soaps.  If your skin becomes reddened/irritated stop using the CHG and inform your nurse when you arrive at Short Stay.  Do not shave (including legs and underarms) for at least 48 hours prior to the first CHG shower.  You may shave your face.  Please follow these instructions carefully:   1.  Shower  with CHG Soap the night before surgery and the                    morning of Surgery.  2.  If you choose to wash your hair, wash your hair first as usual with your       normal shampoo.  3.  After you shampoo, rinse your hair and body thoroughly to remove the shampoo.  4.  Use CHG as you would any other liquid soap.  You can apply chg directly       to the skin and wash gently with scrungie or a clean washcloth.  5.  Apply the CHG Soap to your body ONLY FROM THE NECK DOWN.        Do not use on open wounds or open sores.  Avoid contact with your eyes, ears, mouth and genitals (private parts).  Wash genitals (private parts) with your normal soap.  6.  Wash thoroughly, paying special attention to the area where your surgery        will be performed.  7.  Thoroughly rinse your body with warm water from the neck down.  8.  DO NOT shower/wash with your normal soap after using and rinsing off       the CHG Soap.  9.  Pat yourself dry with a clean towel.            10.  Wear clean pajamas.            11.  Place clean sheets on your bed the night of your first shower and do not        sleep with pets.  Day of Surgery  Do not apply any lotions/deodorants the morning of surgery.  Please wear clean clothes to the hospital.   Please read over the following fact sheets that you were given.

## 2016-10-30 NOTE — Progress Notes (Signed)
Pt has been for his pulmonary appt and doppler study. He denies any recent chest pain or sob.

## 2016-10-30 NOTE — Progress Notes (Signed)
Pre-op Cardiac Surgery  Carotid Findings:  Findings consistent with a low end 1- 39 percent stenosis involving the right internal carotid artery and the left internal carotid artery. Bilateral vertebral arteries are patent and antegrade.  Upper Extremity Right Left  Brachial Pressures 139, Tri 143, Tri  Radial Waveforms Tri Tri  Ulnar Waveforms Tri Tri  Palmar Arch (Allen's Test) Waveform increases with radial compression and obliterates with ulnar compression. Waveform increases with radial compression and reverses with ulnar compression.     [place], RVT 11:01 AM  10/30/2016

## 2016-10-31 LAB — HEMOGLOBIN A1C
HEMOGLOBIN A1C: 5.5 % (ref 4.8–5.6)
Mean Plasma Glucose: 111 mg/dL

## 2016-11-02 ENCOUNTER — Ambulatory Visit (INDEPENDENT_AMBULATORY_CARE_PROVIDER_SITE_OTHER): Payer: PPO | Admitting: Thoracic Surgery (Cardiothoracic Vascular Surgery)

## 2016-11-02 ENCOUNTER — Encounter: Payer: Self-pay | Admitting: Thoracic Surgery (Cardiothoracic Vascular Surgery)

## 2016-11-02 VITALS — BP 130/72 | HR 64 | Resp 20 | Ht 72.0 in | Wt 208.0 lb

## 2016-11-02 DIAGNOSIS — Q231 Congenital insufficiency of aortic valve: Secondary | ICD-10-CM | POA: Diagnosis not present

## 2016-11-02 DIAGNOSIS — I341 Nonrheumatic mitral (valve) prolapse: Secondary | ICD-10-CM

## 2016-11-02 DIAGNOSIS — I340 Nonrheumatic mitral (valve) insufficiency: Secondary | ICD-10-CM

## 2016-11-02 DIAGNOSIS — I719 Aortic aneurysm of unspecified site, without rupture: Secondary | ICD-10-CM

## 2016-11-02 DIAGNOSIS — I7121 Aneurysm of the ascending aorta, without rupture: Secondary | ICD-10-CM

## 2016-11-02 MED ORDER — DOPAMINE-DEXTROSE 3.2-5 MG/ML-% IV SOLN
0.0000 ug/kg/min | INTRAVENOUS | Status: DC
  Filled 2016-11-02: qty 250

## 2016-11-02 MED ORDER — SODIUM CHLORIDE 0.9 % IV SOLN
INTRAVENOUS | Status: AC
  Administered 2016-11-03: .9 [IU]/h via INTRAVENOUS
  Filled 2016-11-02: qty 2.5

## 2016-11-02 MED ORDER — SODIUM CHLORIDE 0.9 % IV SOLN
INTRAVENOUS | Status: DC
  Filled 2016-11-02: qty 30

## 2016-11-02 MED ORDER — PHENYLEPHRINE HCL 10 MG/ML IJ SOLN
30.0000 ug/min | INTRAVENOUS | Status: AC
  Administered 2016-11-03: 15 ug/min via INTRAVENOUS
  Filled 2016-11-02: qty 2

## 2016-11-02 MED ORDER — VANCOMYCIN HCL 10 G IV SOLR
1500.0000 mg | INTRAVENOUS | Status: AC
  Administered 2016-11-03: 1500 mg via INTRAVENOUS
  Filled 2016-11-02: qty 1500

## 2016-11-02 MED ORDER — TRANEXAMIC ACID 1000 MG/10ML IV SOLN
1.5000 mg/kg/h | INTRAVENOUS | Status: AC
  Administered 2016-11-03: 1.5 mg/kg/h via INTRAVENOUS
  Filled 2016-11-02: qty 25

## 2016-11-02 MED ORDER — TRANEXAMIC ACID (OHS) BOLUS VIA INFUSION
15.0000 mg/kg | INTRAVENOUS | Status: AC
  Administered 2016-11-03: 1419 mg via INTRAVENOUS
  Filled 2016-11-02: qty 1419

## 2016-11-02 MED ORDER — NITROGLYCERIN IN D5W 200-5 MCG/ML-% IV SOLN
2.0000 ug/min | INTRAVENOUS | Status: DC
  Filled 2016-11-02: qty 250

## 2016-11-02 MED ORDER — METOPROLOL TARTRATE 12.5 MG HALF TABLET: 12.5000 mg | ORAL_TABLET | Freq: Once | ORAL | Status: DC

## 2016-11-02 MED ORDER — DEXTROSE 5 % IV SOLN
750.0000 mg | INTRAVENOUS | Status: DC
  Filled 2016-11-02: qty 750

## 2016-11-02 MED ORDER — POTASSIUM CHLORIDE 2 MEQ/ML IV SOLN
80.0000 meq | INTRAVENOUS | Status: DC
  Filled 2016-11-02: qty 40

## 2016-11-02 MED ORDER — MAGNESIUM SULFATE 50 % IJ SOLN
40.0000 meq | INTRAMUSCULAR | Status: DC
  Filled 2016-11-02: qty 10

## 2016-11-02 MED ORDER — DEXMEDETOMIDINE HCL IN NACL 400 MCG/100ML IV SOLN
0.1000 ug/kg/h | INTRAVENOUS | Status: AC
  Administered 2016-11-03: .3 ug/kg/h via INTRAVENOUS
  Filled 2016-11-02: qty 100

## 2016-11-02 MED ORDER — GLUTARALDEHYDE 0.625% SOAKING SOLUTION
TOPICAL | Status: DC | PRN
  Filled 2016-11-02: qty 50

## 2016-11-02 MED ORDER — VANCOMYCIN HCL 1000 MG IV SOLR
INTRAVENOUS | Status: AC
  Administered 2016-11-03: 1000 mL
  Filled 2016-11-02: qty 1000

## 2016-11-02 MED ORDER — PLASMA-LYTE 148 IV SOLN
INTRAVENOUS | Status: DC
  Filled 2016-11-02: qty 2.5

## 2016-11-02 MED ORDER — DEXTROSE 5 % IV SOLN
1.5000 g | INTRAVENOUS | Status: AC
  Administered 2016-11-03: .75 g via INTRAVENOUS
  Administered 2016-11-03: 1.5 g via INTRAVENOUS
  Filled 2016-11-02 (×2): qty 1.5

## 2016-11-02 MED ORDER — EPINEPHRINE PF 1 MG/ML IJ SOLN
0.0000 ug/min | INTRAVENOUS | Status: DC
  Filled 2016-11-02: qty 4

## 2016-11-02 MED ORDER — TRANEXAMIC ACID (OHS) PUMP PRIME SOLUTION
2.0000 mg/kg | INTRAVENOUS | Status: DC
  Filled 2016-11-02: qty 1.89

## 2016-11-02 NOTE — Patient Instructions (Signed)
Shave your beard off tonight  Continue taking all current medications without change through the day before surgery.  Have nothing to eat or drink after midnight the night before surgery.  On the morning of surgery take only Toprol and Protonix with a sip of water.

## 2016-11-02 NOTE — Progress Notes (Signed)
[address]       [place] [id number]             [phone]     CARDIOTHORACIC SURGERY OFFICE NOTE  Referring Provider is Jerline Pain, MD PCP is Tammi Sou, MD   HPI:  Patient returns to the office today for follow-up of bicuspid aortic valve associated with aortic stenosis, aortic insufficiency, aneurysmal enlargement of the aortic root, and mitral regurgitation. He was originally seen in consultation on 10/12/2016. He returns to the office today with tentative plans to proceed with Bentall aortic root replacement and mitral valve repair tomorrow. Since he was last seen he was treated by his primary care physician for an upper respiratory tract infection. Symptoms consisted of nasal congestion and runny nose without fevers, chills, or productive cough. The patient states that symptoms resolved completely and he currently feels back to his baseline.   Current Outpatient Prescriptions  Medication Sig Dispense Refill  . acetaminophen (TYLENOL) 500 MG tablet Take 500-1,000 mg by mouth every 6 (six) hours as needed for mild pain (depends on pain level if takes 500 mg or 1000 mg).     . fenofibrate (TRICOR) 145 MG tablet TAKE 1 TABLET(145 MG) BY MOUTH DAILY 90 tablet 3  . metoprolol succinate (TOPROL XL) 25 MG 24 hr tablet Take 1 tablet (25 mg total) by mouth daily. 90 tablet 3  . Multiple Vitamins-Minerals (MULTIVITAMIN PO) Take 1 tablet by mouth daily.     . pantoprazole (PROTONIX) 40 MG tablet TAKE 1 TABLET BY MOUTH EVERY DAY 90 tablet 3   No current facility-administered medications for this visit.    Facility-Administered Medications Ordered in Other Visits  Medication Dose Route Frequency Provider Last Rate Last Dose  . [START ON 11/03/2016] cefUROXime (ZINACEF) 1.5 g in dextrose 5 % 50 mL IVPB  1.5 g Intravenous To OR Rexene Alberts, MD      . Derrill Memo ON 11/03/2016] cefUROXime (ZINACEF) 750 mg in dextrose 5 % 50 mL IVPB  750 mg Intravenous To OR  Rexene Alberts, MD      . Derrill Memo ON 11/03/2016] dexmedetomidine (PRECEDEX) 400 MCG/100ML (4 mcg/mL) infusion  0.1-0.7 mcg/kg/hr Intravenous To OR Rexene Alberts, MD      . Derrill Memo ON 11/03/2016] DOPamine (INTROPIN) 800 mg in dextrose 5 % 250 mL (3.2 mg/mL) infusion  0-10 mcg/kg/min Intravenous To OR Rexene Alberts, MD      . Derrill Memo ON 11/03/2016] EPINEPHrine (ADRENALIN) 4 mg in dextrose 5 % 250 mL (0.016 mg/mL) infusion  0-10 mcg/min Intravenous To OR Rexene Alberts, MD      . Derrill Memo ON 11/03/2016] glutaraldehyde 0.625% soaking solution   Topical PRN Rexene Alberts, MD      . Derrill Memo ON 11/03/2016] heparin 2,500 Units, papaverine 30 mg in electrolyte-148 (PLASMALYTE-148) 500 mL irrigation   Irrigation To OR Rexene Alberts, MD      . Derrill Memo ON 11/03/2016] heparin 30,000 units/NS 1000 mL solution for CELLSAVER   Other To OR Rexene Alberts, MD      . Derrill Memo ON 11/03/2016] insulin regular (NOVOLIN R,HUMULIN R) 250 Units in sodium chloride 0.9 % 250 mL (1 Units/mL) infusion   Intravenous To OR Rexene Alberts, MD      . Derrill Memo ON 11/03/2016] magnesium sulfate (IV Push/IM) injection 40 mEq  40 mEq Other To OR Rexene Alberts, MD      . Derrill Memo ON 11/03/2016] metoprolol tartrate (  LOPRESSOR) tablet 12.5 mg  12.5 mg Oral Once Rexene Alberts, MD      . Derrill Memo ON 11/03/2016] nitroGLYCERIN 50 mg in dextrose 5 % 250 mL (0.2 mg/mL) infusion  2-200 mcg/min Intravenous To OR Rexene Alberts, MD      . Derrill Memo ON 11/03/2016] phenylephrine (NEO-SYNEPHRINE) 20 mg in dextrose 5 % 250 mL (0.08 mg/mL) infusion  30-200 mcg/min Intravenous To OR Rexene Alberts, MD      . Derrill Memo ON 11/03/2016] potassium chloride injection 80 mEq  80 mEq Other To OR Rexene Alberts, MD      . Derrill Memo ON 11/03/2016] tranexamic acid (CYKLOKAPRON) 2,500 mg in sodium chloride 0.9 % 250 mL (10 mg/mL) infusion  1.5 mg/kg/hr Intravenous To OR Rexene Alberts, MD      . Derrill Memo ON 11/03/2016] tranexamic acid (CYKLOKAPRON) bolus via infusion - over  30 minutes 1,419 mg  15 mg/kg Intravenous To OR Rexene Alberts, MD      . Derrill Memo ON 11/03/2016] tranexamic acid (CYKLOKAPRON) pump prime solution 189 mg  2 mg/kg Intracatheter To OR Rexene Alberts, MD      . Derrill Memo ON 11/03/2016] vancomycin (VANCOCIN) 1,000 mg in sodium chloride 0.9 % 1,000 mL irrigation   Irrigation To OR Rexene Alberts, MD      . Derrill Memo ON 11/03/2016] vancomycin (VANCOCIN) 1,500 mg in sodium chloride 0.9 % 250 mL IVPB  1,500 mg Intravenous To OR Rexene Alberts, MD          Physical Exam:   BP 130/72 (BP Location: Right Arm, Patient Position: Sitting, Cuff Size: Normal)   Pulse 64   Resp 20   Ht 6' (1.829 m)   Wt 208 lb (94.3 kg)   SpO2 97% Comment: RA  BMI 28.21 kg/m   General:  Well-appearing  Chest:   Clear to auscultation  CV:   Regular rate and rhythm with systolic and diastolic murmurs  Incisions:  n/a  Abdomen:  Soft nontender  Extremities:  Warm and well-perfused  Diagnostic Tests:  CHEST  2 VIEW  COMPARISON:  Radiographs of February 18, 2015.  FINDINGS: The heart size and mediastinal contours are within normal limits. Both lungs are clear. No pneumothorax or pleural effusion is noted. The visualized skeletal structures are unremarkable.  IMPRESSION: No active cardiopulmonary disease.   Electronically Signed   By: Marijo Conception, M.D.   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 He also has mitral valve prolapse with large flail segment of the anterior leaflet of the mitral valve and severe mitral regurgitation. Left ventricular systolic function is significantly reduced with ejection fraction estimated 40-45% in the setting of severe aortic insufficiency and severe mitral regurgitation.  The left ventricle is moderately dilated. Diagnostic cardiac  catheterization is notable for the absence of significant coronary artery disease and mild pulmonary hypertension.  The patient tends to minimize symptoms but does admit to exertional shortness of breath associated with more strenuous physical exertion, such as walking up a steep incline. I agree the patient needs surgical intervention to consist of Bentall aortic root replacement and mitral valve repair.   Risks associated with elective surgery will be somewhat increased because of the magnitude of the surgical procedure and the presence of significant left ventricular systolic dysfunction. The patient is otherwise healthy and free of significant  comorbid medical problems.    Plan:  I have again discussed the nature of the patient's complex valvular heart disease at length with the patient and his wife in the office today. We discussed the indications, risks, and potential benefits of surgery as well as long-term prognosis without surgery but with close medical follow-up.  Surgical options were discussed at length.   Based upon review of the patient's transesophageal echocardiogram I feel there remains a fairly high likelihood that his mitral valve should be repairable, but the aortic valve will need to be replaced.   Discussion was held comparing the relative risks of mechanical aortic valve replacement with need for lifelong anticoagulation versus use of a bioprosthetic tissue valve and the associated potential for late structural valve deterioration and failure.  This discussion was placed in the context of the patient's particular circumstances, and as a result the patient specifically requests that their valve be replaced using a mechanical valve.  The patient understands and accepts all potential associated risks of surgery including but not limited to risk of death, stroke, myocardial infarction, congestive heart failure, respiratory failure, renal failure, pneumonia, bleeding requiring blood  transfusion and or reexploration, arrhythmia, heart block or bradycardia requiring permanent pacemaker, aortic dissection or other major vascular complication, pleural effusions or other delayed complications related to continued congestive heart failure, and other late complications related to valve replacement including structural valve deterioration and failure, thrombosis, endocarditis, or paravalvular leak.  All of his questions have been addressed.   I spent in excess of 30 minutes during the conduct of this office consultation and >50% of this time involved direct face-to-face encounter with the patient for counseling and/or coordination of their care.    Valentina Gu. Roxy Manns, MD 11/02/2016 3:38 PM

## 2016-11-02 NOTE — H&P (Signed)
[address]       [place] [address]             [phone]          CARDIOTHORACIC SURGERY HISTORY AND PHYSICAL EXAM  Referring Provider is Jerline Pain, MD PCP is Tammi Sou, MD      Chief Complaint  Patient presents with  . Thoracic Aortic Aneurysm    Surgical eval, Cardiac Cath 09/29/16, TEE 09/17/16,Gated Cardiac CT 09/22/16  . Aortic Insuffiency  . 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 The patient states that he has known he had a heart murmur all of his life. He moved to [place] approximately three years ago, and prior to that he had never been formally evaluated by a cardiologist. He was noted to have a prominent murmur on physical exam and referred for consultation last fall when he was first evaluated by Dr. [name]. The patient reported to be completely asymptomatic at that time, and echocardiogram revealed what was felt to be moderate aortic stenosis and moderate mitral regurgitation. Cardiac gated CT angiogram of the heart was performed to evaluate the aortic root aneurysm which measured less than 5 cm in its greatest diameter.  The patient was seen in follow-up recently by Dr. [name] and repeat echocardiogram revealed a significant drop in left ventricular ejection fraction with increased mitral regurgitation. The patient subsequently underwent transesophageal echocardiogram 09/17/2016. This confirmed the presence of a functionally bicuspid aortic valve with moderate to severe aortic insufficiency. There was mitral valve prolapse with severe mitral regurgitation caused by multiple ruptured chordae tendineae from the middle scallop of the anterior leaflet.  Left ventricular function was reduced with ejection fraction  estimated 40-45%. Left and right heart catheterization was performed 09/29/2016. This revealed no significant coronary artery disease and mild pulmonary hypertension. There were large V waves consistent with severe mitral regurgitation and severe aortic insufficiency. The patient was referred for surgical consultation.  The patient is married and lives with his wife in [place].  He has been retired since 2014 when he had his wife moved from [place] to [place] to be near their grandchildren.  He admits that he has never been very active physically. He does not exercise on a regular basis but he reports no significant physical limitations. 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 He states that he does experience mild exertional shortness of breath but this occurs only one he walks up his driveway taking out the trash. With ordinary activities around the house he has no shortness of breath. He states that in the past when he used to go to the gym to exercise he might get some tightness across his chest walking on a treadmill. He has not done this in quite some time. He denies any palpitations, dizzy spells, or syncope. He denies any history of resting shortness of breath, PND, or orthopnea. He does get mild lower extremity edema when he has been on his feet for a long time.      Past Medical History:  Diagnosis Date  . Aortic insufficiency due to bicuspid aortic valve    plan is for mechanical aortic valve to be placed 10/2016  . Aortic root aneurysm (HCC)    4.8 cm by  cardiac gated CTA: Plan for repair by CT surg 10/2016  . Basal cell carcinoma    nose  . Bicuspid aortic valve    TEE 08/2016 mod/sev AR w/functional bicuspid AV  . Chronic hoarseness    secondary to hx of laryngeal surgery  . GERD (gastroesophageal reflux disease)   . Headache   . Heart murmur   . History of tobacco abuse   . Hypertriglyceridemia   . Increased prostate specific antigen (PSA)  velocity 09/2016   referred to urology 09/27/16  . Laryngeal cancer [facility]) 2002   Surgery and radiation---He had post treatment surveillance x 12 yrs and was officially "released" from f/u in summer 2014.  Marland Kitchen Lesion of vocal cord   . Mitral regurgitation    severe by TEE 08/2016: plan is for MV repair (?maybe replacement?) by CT surg 10/2016.  . Mitral valve prolapse   . PONV (postoperative nausea and vomiting)    pt denies this 10/30/16  . Thoracic ascending aortic aneurysm (HCC)    48 mm 09/2015    Past Surgical History:  Procedure Laterality Date  . CARDIAC CATHETERIZATION N/A 09/29/2016   Procedure: Right/Left Heart Cath and Coronary Angiography;  Surgeon: Jettie Booze, MD;  Location: [place] [facility];  Service: Cardiovascular;  Laterality: N/A;  . CHOLECYSTECTOMY  1980  . COLONOSCOPY  2003 and 2013   Both normal.  . DIRECT LARYNGOSCOPY Left 10/17/2015   Procedure: MICRO DIRECT LARYNGOSCOPY WITH FROZEN SECTION;  Surgeon: Jodi Marble, MD;  Location: [facility];  Service: ENT;  Laterality: Left;  . PERIPHERAL VASCULAR CATHETERIZATION N/A 09/29/2016   Procedure: Abdominal Aortogram;  Surgeon: Jettie Booze, MD;  Location: [facility] [facility];  Service: Cardiovascular;  Laterality: N/A;  . PERIPHERAL VASCULAR CATHETERIZATION N/A 09/29/2016   Procedure: Thoracic Aortogram;  Surgeon: Jettie Booze, MD;  Location: [facility];  Service: Cardiovascular;  Laterality: N/A;  . TEE WITHOUT CARDIOVERSION N/A 09/17/2016   Procedure: TRANSESOPHAGEAL ECHOCARDIOGRAM (TEE);  Surgeon: Jerline Pain, MD;  Location: [facility];  Service: Cardiovascular;  Laterality: N/A;  . TONSILECTOMY/ADENOIDECTOMY WITH MYRINGOTOMY  1960  . 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  Mod/severe AR w/functional bicuspid AV.  EF 45%.  . TRANSTHORACIC ECHOCARDIOGRAM  07/2015   Bicuspid aortic valve + mod mitral regurg  . Vocal cord biopsy  09/2015   No atypia or  inflammation.  Some fibrous changes that are possibly from hx of radiation therapy.    Family History  Problem Relation Age of Onset  . Heart disease Father   . Cancer Mother     breast    Social History Social History  Substance Use Topics  . Smoking status: Former Smoker    Quit date: 07/22/1979  . Smokeless tobacco: Never Used  . Alcohol use Yes     Comment: rare    Prior to Admission medications   Medication Sig Start Date End Date Taking? Authorizing Provider  acetaminophen (TYLENOL) 500 MG tablet Take 500-1,000 mg by mouth every 6 (six) hours as needed for mild pain (depends on pain level if takes 500 mg or 1000 mg).    Yes Historical Provider, MD  amoxicillin (AMOXIL) 875 MG tablet Take 1 tablet (875 mg total) by mouth 2 (two) times daily. 10/23/16  Yes Midge Minium, MD  fenofibrate (TRICOR) 145 MG tablet TAKE 1 TABLET(145 MG) BY MOUTH DAILY 10/16/15  Yes Tammi Sou, MD  metoprolol succinate (TOPROL XL) 25  MG 24 hr tablet Take 1 tablet (25 mg total) by mouth daily. 09/16/16  Yes Jerline Pain, MD  Multiple Vitamins-Minerals (MULTIVITAMIN PO) Take 1 tablet by mouth daily.    Yes Historical Provider, MD  pantoprazole (PROTONIX) 40 MG tablet TAKE 1 TABLET BY MOUTH EVERY DAY 02/20/16  Yes Tammi Sou, MD    No Known Allergies    Review of Systems:              General:                      normal appetite, normal energy, no weight gain, no weight loss, no fever             Cardiac:                       + chest pain with exertion, no chest pain at rest, + SOB with more strenuous exertion, no resting SOB, no PND, no orthopnea, no palpitations, no arrhythmia, no atrial fibrillation, mild LE edema, no dizzy spells, no syncope             Respiratory:                 no shortness of breath, no home oxygen, no productive cough, no dry cough, no bronchitis, no wheezing, no hemoptysis, no asthma, no pain with inspiration or cough, no sleep apnea, no CPAP at night              GI:                               no difficulty swallowing, no reflux, no frequent heartburn, no hiatal hernia, no abdominal pain, no constipation, no diarrhea, no hematochezia, no hematemesis, no melena             GU:                              no dysuria,  no frequency, no urinary tract infection, no hematuria, no enlarged prostate, no kidney stones, no kidney disease             Vascular:                     no pain suggestive of claudication, no pain in feet, no leg cramps, no varicose veins, no DVT, no non-healing foot ulcer             Neuro:                         no stroke, no TIA's, no seizures, no headaches, no temporary blindness one eye,  no slurred speech, no peripheral neuropathy, no chronic pain, no instability of gait, no memory/cognitive dysfunction             Musculoskeletal:         no arthritis, no joint swelling, no myalgias, no difficulty walking, normal mobility              Skin:                            no rash, no itching, no skin infections, no pressure sores or ulcerations  Psych:                         no anxiety, no depression, no nervousness, no unusual recent stress             Eyes:                           no blurry vision, no floaters, no recent vision changes, + wears glasses or contacts             ENT:                            no hearing loss, no loose or painful teeth, no dentures, last saw dentist 4 months ago             Hematologic:               no easy bruising, no abnormal bleeding, no clotting disorder, no frequent epistaxis             Endocrine:                   no diabetes, does not check CBG's at home                           Physical Exam:              BP 139/68 (BP Location: Right Arm, Patient Position: Sitting, Cuff Size: Normal)   Pulse 61   Resp 18   Ht 6' (1.829 m)   Wt 211 lb (95.7 kg)   SpO2 97% Comment: RA  BMI 28.62 kg/m              General:                        well-appearing             HEENT:                        Unremarkable              Neck:                           no JVD, no bruits, no adenopathy              Chest:                          clear to auscultation, symmetrical breath sounds, no wheezes, no rhonchi              CV:                              RRR, grade IV/VI holosystolic murmur best LLSB and III/VI diastolic murmur             Abdomen:                    soft, non-tender, no masses              Extremities:                 warm, well-perfused, pulses palpable,  no LE edema             Rectal/GU                   Deferred             Neuro:                         Grossly non-focal and symmetrical throughout             Skin:                            Clean and dry, no rashes, no breakdown   Diagnostic Tests:  Transthoracic Echocardiography  Patient: Jaimeson, Vitiello MR #: [id number] Study Date: 08/28/2016 Gender: M Age: 70 Height: 182.9 cm Weight: 95.7 kg BSA: 2.22 m^2 Pt. Status: Room:  ATTENDING Candee Furbish, M.D. ORDERING Candee Furbish, M.D. REFERRING Candee Furbish, M.D. SONOGRAPHER Cindy Hazy, RDCS PERFORMING [facility] Outpatient  cc:  ------------------------------------------------------------------- LV EF: 40% - 45%  ------------------------------------------------------------------- Indications: Q23.1 Bicuspid Aortic Valve. I77.810 Dilated Aortic Root.  ------------------------------------------------------------------- History: PMH: Acquired from the patient and from the patient&'s chart. PMH: Bicuspid Aortic Valve. Risk factors: Former tobacco use.  ------------------------------------------------------------------- Study Conclusions  - Left ventricle: The cavity size was moderately dilated. Wall thickness was normal. Systolic function was mildly to moderately reduced. The estimated ejection fraction was in the range of 40% to 45%. Features are  consistent with a pseudonormal left ventricular filling pattern, with concomitant abnormal relaxation and increased filling pressure (grade 2 diastolic dysfunction). - Aortic valve: There was mild regurgitation. - Mitral valve: There was severe regurgitation. - Left atrium: The atrium was moderately dilated.  ------------------------------------------------------------------- Study data: Study status: Routine. Procedure: The patient reported no pain pre or post test. Transthoracic echocardiography for left ventricular function evaluation, for right ventricular function evaluation, and for assessment of valvular function. Image quality was adequate. Study completion: There were no complications. Transthoracic echocardiography. M-mode, complete 2D, spectral Doppler, and color Doppler. Birthdate: Patient birthdate: [birth year]. Age: Patient is 70 yr old. Sex: Gender: male. BMI: 28.6 kg/m^2. Blood pressure: 122/62 Patient status: Outpatient. Study date: Study date: 08/28/2016. Study time: 03:16 PM. Location: [facility]  -------------------------------------------------------------------  ------------------------------------------------------------------- Left ventricle: The cavity size was moderately dilated. Wall thickness was normal. Systolic function was mildly to moderately reduced. The estimated ejection fraction was in the range of 40% to 45%. Features are consistent with a pseudonormal left ventricular filling pattern, with concomitant abnormal relaxation and increased filling pressure (grade 2 diastolic dysfunction).  ------------------------------------------------------------------- Aortic valve: There is fusion of the RCC and LCC resulting in a functional bicuspid aortic valve. Severely thickened, mildly calcified leaflets. Doppler: There was mild regurgitation. VTI ratio of LVOT to aortic valve: 0.4. Valve area (VTI):  2.12 cm^2. Indexed valve area (VTI): 0.95 cm^2/m^2. Peak velocity ratio of LVOT to aortic valve: 0.38. Valve area (Vmax): 2.01 cm^2. Indexed valve area (Vmax): 0.9 cm^2/m^2. Mean velocity ratio of LVOT to aortic valve: 0.41. Valve area (Vmean): 2.17 cm^2. Indexed valve area (Vmean): 0.97 cm^2/m^2. Mean gradient (S): 10 mm Hg. Peak gradient (S): 18 mm Hg.  ------------------------------------------------------------------- Aorta: The aorta was moderately dilated.  ------------------------------------------------------------------- Mitral valve: Mildly thickened leaflets . Doppler: There was severe regurgitation. Peak gradient (D): 4 mm Hg.  ------------------------------------------------------------------- Left atrium: The atrium was moderately dilated.  ------------------------------------------------------------------- Right ventricle: The cavity size was normal. Systolic function was normal.  -------------------------------------------------------------------  Pulmonic valve: The valve appears to be grossly normal. Doppler: There was no significant regurgitation.  ------------------------------------------------------------------- Tricuspid valve: The valve appears to be grossly normal. Doppler: There was no significant regurgitation.  ------------------------------------------------------------------- Right atrium: The atrium was normal in size.  ------------------------------------------------------------------- Pericardium: There was no pericardial effusion.  ------------------------------------------------------------------- Post procedure conclusions Ascending Aorta:  - The aorta was moderately dilated.  ------------------------------------------------------------------- Measurements  Left ventricle Value Reference LV ID, ED, PLAX chordal (H) 63.3 mm 43 - 52 LV ID, ES, PLAX  chordal (H) 47.2 mm 23 - 38 LV fx shortening, PLAX chordal (L) 25 % >=29 LV PW thickness, ED 8.69 mm --------- IVS/LV PW ratio, ED 1.17 <=1.3 Stroke volume, 2D 94 ml --------- Stroke volume/bsa, 2D 42 ml/m^2 --------- LV e&', lateral 14.1 cm/s --------- LV E/e&', lateral 7.23 --------- LV e&', medial 8.44 cm/s --------- LV E/e&', medial 12.09 --------- LV e&', average 11.27 cm/s --------- LV E/e&', average 9.05 ---------  Ventricular septum Value Reference IVS thickness, ED 10.2 mm ---------  LVOT Value Reference LVOT ID, S 26 mm --------- LVOT area 5.31 cm^2 --------- LVOT ID 26 mm --------- LVOT peak velocity, S 80.5 cm/s --------- LVOT mean velocity, S 60.8 cm/s --------- LVOT VTI, S 17.7 cm --------- LVOT peak gradient, S 3 mm Hg --------- Stroke volume (SV), LVOT DP 94 ml --------- Stroke index (SV/bsa), LVOT DP 42.3 ml/m^2 ---------  Aortic valve Value Reference Aortic valve peak velocity, S 213 cm/s --------- Aortic valve mean velocity, S 149 cm/s --------- Aortic valve VTI, S 44.4 cm --------- Aortic mean gradient, S  10 mm Hg --------- Aortic peak gradient, S 18 mm Hg --------- VTI ratio, LVOT/AV 0.4 --------- Aortic valve area, VTI 2.12 cm^2 --------- Aortic valve area/bsa, VTI 0.95 cm^2/m^2 --------- Velocity ratio, peak, LVOT/AV 0.38 --------- Aortic valve area, peak velocity 2.01 cm^2 --------- Aortic valve area/bsa, peak 0.9 cm^2/m^2 --------- velocity Velocity ratio, mean, LVOT/AV 0.41 --------- Aortic valve area, mean velocity 2.17 cm^2 --------- Aortic valve area/bsa, mean 0.97 cm^2/m^2 --------- velocity Aortic regurg pressure half-time 470 ms ---------  Aorta Value Reference Aortic root ID, ED 39 mm --------- Ascending aorta ID, A-P, S 49 mm ---------  Left atrium Value Reference LA ID, A-P, ES 58 mm --------- LA ID/bsa, A-P (H) 2.61 cm/m^2 <=2.2 LA volume, S 88 ml --------- LA volume/bsa, S 39.6 ml/m^2 --------- LA volume, ES, 1-p A4C 86 ml --------- LA volume/bsa, ES, 1-p A4C 38.7 ml/m^2 --------- LA volume, ES, 1-p A2C 89 ml --------- LA volume/bsa, ES, 1-p A2C 40 ml/m^2 ---------  Mitral valve Value Reference Mitral E-wave peak velocity 102 cm/s --------- Mitral A-wave peak velocity 107 cm/s --------- Mitral deceleration time 201 ms 150 - 230 Mitral peak  gradient, D 4 mm Hg --------- Mitral E/A ratio, peak 1 --------- Mitral maximal regurg velocity, 572 cm/s --------- PISA Mitral regurg VTI, PISA 179 cm ---------  Right ventricle Value Reference RV s&', lateral, S 15.4 cm/s ---------  Legend: (L) and (H) mark values outside specified reference range.  ------------------------------------------------------------------- Prepared and Electronically Authenticated by  Mertie Moores, M.D. 2017-09-08T16:40:23   Transesophageal Echocardiography  (Report amended )  Patient: Kenneth Ryan, Kenneth Ryan MR #: [id number] Study Date: 09/17/2016 Gender: M Age: 70 Height: 182.9 cm Weight: 95.9 kg BSA: 2.23 m^2 Pt. Status: Room:  SONOGRAPHER Florentina Jenny, RDCS ADMITTING Candee Furbish, M.D. ATTENDING Candee Furbish, M.D. ORDERING Candee Furbish, M.D. PERFORMING Candee Furbish, M.D. REFERRING Candee Furbish, M.D.  cc:  ------------------------------------------------------------------- LV EF: 40% - 45%  ------------------------------------------------------------------- Indications: Mitral regurgitation 424.0.  -------------------------------------------------------------------  Study Conclusions  - Left ventricle: The cavity size was normal. Wall thickness was normal. Systolic function was mildly to moderately reduced. The estimated ejection fraction was in the range of 40% to 45%. Diffuse hypokinesis. - Aortic valve: Functionally bicuspid; moderately thickened, moderately calcified leaflets; fusion of the right-left coronary commissure. There was moderate to severe regurgitation. - Aorta: The aorta was moderately dilated. Aortic root dimension: 48 mm (ED, M-mode). - Mitral valve: There was  ruptured chordae resulting in partial flail anterior leaflet. There was severe regurgitation directed eccentrically and posteriorly. - Left atrium: No evidence of thrombus in the appendage. - Right atrium: No evidence of thrombus in the atrial cavity or appendage. - Atrial septum: No defect or patent foramen ovale was identified. - Tricuspid valve: No evidence of vegetation. - Pulmonic valve: No evidence of vegetation. - Superior vena cava: The study excluded a thrombus.  Impressions:  - Surgery consultation. Discuss Bentall and mitral valve repair/replacement. Concerned that EF is reduced.  ------------------------------------------------------------------- Study data: Study status: Routine. Consent: The risks, benefits, and alternatives to the procedure were explained to the patient and informed consent was obtained. Procedure: The patient reported no pain pre or post test. Initial setup. The patient was brought to the laboratory. Surface ECG leads were monitored. Sedation. Conscious sedation was administered by cardiology staff. Transesophageal echocardiography. An adult multiplane transesophageal probe was inserted by the attending cardiologistwithout difficulty. Image quality was adequate. Study completion: The patient tolerated the procedure well. There were no complications. Administered medications: Fentanyl, 52mcg, IV. Midazolam, 4mg , IV. Diagnostic transesophageal echocardiography. 2D and color Doppler. Birthdate: Patient birthdate: 19-Jan-1946. Age: Patient is 70 yr old. Sex: Gender: male. BMI: 28.7 kg/m^2. Blood pressure: 129/45 Patient status: Outpatient. Study date: Study date: 09/17/2016. Study time: 03:06 PM. Location: [facility].  -------------------------------------------------------------------  ------------------------------------------------------------------- Left ventricle: The cavity size was normal.  Wall thickness was normal. Systolic function was mildly to moderately reduced. The estimated ejection fraction was in the range of 40% to 45%. Diffuse hypokinesis.  ------------------------------------------------------------------- Aortic valve: Functionally bicuspid; moderately thickened, moderately calcified leaflets; fusion of the right-left coronary commissure. Doppler: There was no stenosis. There was moderate to severe regurgitation. Vena contracta was 0.58cm.  ------------------------------------------------------------------- Aorta: The aorta was moderately dilated.  ------------------------------------------------------------------- Mitral valve: Mildly thickened leaflets . There was ruptured chordae resulting in partial flail anterior leaflet. Doppler: There was severe regurgitation directed eccentrically and posteriorly.  ------------------------------------------------------------------- Left atrium: The atrium was normal in size. No evidence of thrombus in the appendage. The appendage was of normal size. Emptying velocity was normal.  ------------------------------------------------------------------- Atrial septum: No defect or patent foramen ovale was identified.  ------------------------------------------------------------------- Right ventricle: The cavity size was normal. Wall thickness was normal. Systolic function was normal.  ------------------------------------------------------------------- Pulmonic valve: Structurally normal valve. Cusp separation was normal. No evidence of vegetation.  ------------------------------------------------------------------- Tricuspid valve: Structurally normal valve. Leaflet separation was normal. No evidence of vegetation. Doppler: There was trivial regurgitation.  ------------------------------------------------------------------- Pulmonary artery: The main pulmonary artery was  normal-sized.  ------------------------------------------------------------------- Right atrium: The atrium was normal in size. No evidence of thrombus in the atrial cavity or appendage.  ------------------------------------------------------------------- Pericardium: The pericardium was normal in appearance. There was no pericardial effusion.  ------------------------------------------------------------------- Systemic veins: Superior vena cava: The study excluded a thrombus.  ------------------------------------------------------------------- Post procedure conclusions Ascending Aorta:  - The aorta was moderately dilated.  ------------------------------------------------------------------- Measurements  Aorta Value Reference Aortic root ID, ED, MM (H) 48 mm 20 - 37  Legend: (L) and (H) mark values outside specified reference range.  -------------------------------------------------------------------  Lovenia Kim, M.D. 2017-09-28T17:30:59    CARDIAC CATHETERIZATION  Abdominal Aortogram  Right/Left Heart Cath and Coronary Angiography  Thoracic Aortogram  Conclusion     The left ventricular ejection fraction is 55-65% by visual estimate. LV was visualized due to severe AI on supravalvular aortogram.  Hemodynamic findings consistent with mild pulmonary hypertension.  No significant CAD.  Prominent V waves consistent with severe mitral regurgitation.  No renal artery stenosis noted. No AAA.  There is severe (4+) aortic regurgitation.  Continue with plans for referral to Dr. Roxy Manns for valve surgery.    Indications   Mitral valve insufficiency, unspecified etiology [I34.0 (ICD-10-CM)]  Aortic valve insufficiency, etiology of cardiac valve disease unspecified [I35.1 (ICD-10-CM)]  Bicuspid aortic valve [Q23.1 (ICD-10-CM)]  Procedural Details/Technique   Technical Details The risks, benefits, and  details of the procedure were explained to the patient. The patient verbalized understanding and wanted to proceed. Informed written consent was obtained.  PROCEDURE TECHNIQUE: After Xylocaine anesthesia a 66F slender sheath was placed in the right radial artery with a single anterior needle wall stick. IV Heparin was given. Right coronary angiography was done using a 3DRC guide catheter. Left coronary angiography was done using a Judkins L3.5 guide catheter. Left ventriculography was not done. The valve was not crossed. Supravalvular aortography and abdominal aortography were performed with power injection of contrast using a pigtail catheter. A TR band was used for hemostasis.  Contrast: 140     Estimated blood loss <50 mL.  During this procedure the patient was administered the following to achieve and maintain moderate conscious sedation: Versed 2 mg, Fentanyl 50 mcg, while the patient's heart rate, blood pressure, and oxygen saturation were continuously monitored. The period of conscious sedation was 44 minutes, of which I was present face-to-face 100% of this time.    Complications   Complications documented before study signed (09/29/2016 10:00 AM EDT)   No complications were associated with this study.  Documented by Jettie Booze, MD - 09/29/2016 9:59 AM EDT    Coronary Findings   Dominance: Right  Left Anterior Descending  The vessel exhibits minimal luminal irregularities.  Left Circumflex  The vessel exhibits minimal luminal irregularities.  Vascular Findings   Abdominal Aorta Abdominal Aorta: The abdominal aorta is not aneurysmal.   No renal artery stenosis noted.  Small right common iliac artery aneurysm.    Right Heart   Right Heart Pressures Hemodynamic findings consistent with mild pulmonary hypertension.    Left Heart   Left Ventricle The left ventricle is dilated. The left ventricular ejection fraction is 55-65% by visual estimate. No regional  wall motion abnormalities. Findings noted on supravalvular aortography.    Aorta Aortic Root: The aortic root displays dilation. There is severe (4+) aortic regurgitation.    Coronary Diagrams   Diagnostic Diagram     Implants        No implant documentation for this case.  PACS Images   Show images for Cardiac catheterization   Link to Procedure Log   Procedure Log    Hemo Data   Flowsheet Row Most Recent Value  Fick Cardiac Output 6.09 L/min  Fick Cardiac Output Index 2.79 (L/min)/BSA  RA A Wave 13 mmHg  RA V Wave 12 mmHg  RA Mean 8 mmHg  RV Systolic Pressure 43 mmHg  RV Diastolic Pressure 7 mmHg  RV EDP 12 mmHg  PA Systolic Pressure 44 mmHg  PA Diastolic Pressure 21 mmHg  PA Mean 33 mmHg  PW  A Wave 22 mmHg  PW V Wave 40 mmHg  PW Mean 24 mmHg  AO Systolic Pressure 123XX123 mmHg  AO Diastolic Pressure 55 mmHg  AO Mean 83 mmHg  QP/QS 1  TPVR Index 11.81 HRUI  TSVR Index 29.7 HRUI  PVR SVR Ratio 0.12  TPVR/TSVR Ratio 0.4      Retrospective Gated Cardiac CT TECHNIQUE: The patient was scanned on a Philips 256 scanner. A 120 kV retrospective scan was triggered in the descending thoracic aorta at 111 HU's. Gantry rotation speed was 270 msecs and collimation was .9 mm. No beta blockade or nitro were given. The 3D data set was reconstructed in 5% intervals of the R-R cycle. Systolic and diastolic phases were analyzed on a dedicated work station using MPR, MIP and VRT modes. The patient received 80 cc of contrast. FINDINGS: Aortic Valve: Appears trileaflet with partial fusion of the left and right cusps. (appeared to be 3 sinuses). Leaflets calcified but valve area by planimetry over 3cm2 indicating no significant stenosis Aorta: Moderate to severe ascending aortic root dilatation. Normal origin of the arch vessels No coarctation Sinotubular Junction:  37 mm Ascending Thoracic Aorta:  48 mm Aortic Arch:  35 mm Descending Thoracic Aorta:  28 mm Sinus of  Valsalva Measurements: Non-coronary:  42 mm Right coronary: 40 mm Left coronary: 39 mm Calcium Score:  Calcium noted in the proximal LAD and RCA. Coronary Arteries: Right dominant. Non obstructive calcified disease in proximal RCA and LAD IMPRESSION: 1) Calcium Score 201 which is 63rd percentile for age and sex matched controls 2) No obstructive CAD. Right dominant. Less than 30% calcified disease in proximal LAD and RCA 3) Moderate to severe Ascending aortic root enlargement 48 mm. 4) Dilated non coronary of Valsalva 42 mm 5) Normal origin of the great vessels with no coarctation. 6) Aortic valve calcified but not stenotic Appears trileaflet with partial fusion of the left and right coronary cusps Jenkins Rouge Electronically Signed   By: Jenkins Rouge M.D.   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 He also has mitral valve prolapse with large flail segment of the anterior leaflet of the mitral valve and severe mitral regurgitation. Left ventricular systolic function is significantly reduced with ejection fraction estimated 40-45% in the setting of severe aortic insufficiency and severe mitral regurgitation. The left ventricle is moderately dilated. Diagnostic cardiac catheterization is notable for the absence of significant coronary artery disease and mild pulmonary hypertension. The patient tends to minimize symptoms but does admit to exertional shortness of breath associated with more strenuous physical exertion, such as walking up a steep incline. I agree the patient needs surgical intervention to consist of Bentall aortic root replacement and mitral valve repair. Risks associated with elective surgery will be somewhat increased because of the magnitude of the surgical procedure and the presence of  significant left ventricular systolic dysfunction. The patient is otherwise healthy and free of significant comorbid medical problems.    Plan:  I have again discussed the nature of the patient's complex valvular heart disease at length with the patient and his wife in the office today. We discussed the indications, risks, and potential benefits of surgery as well as long-term prognosis without surgery but with close medical follow-up. Surgical options were discussed at length. Based upon review of the patient's transesophageal echocardiogram I feel there  remains a fairly high likelihood that his mitral valve should be repairable, but the aortic valve will need to be replaced. Discussion was held comparing the relative risks of mechanical aortic valve replacement with need for lifelong anticoagulation versus use of a bioprosthetic tissue valve and the associated potential for late structural valve deterioration and failure. This discussion was placed in the context of the patient's particular circumstances, and as a result the patient specifically requests that their valve be replaced using a mechanical valve. The patient understands and accepts all potential associated risks of surgery including but not limited to risk of death, stroke, myocardial infarction, congestive heart failure, respiratory failure, renal failure, pneumonia, bleeding requiring blood transfusion and or reexploration, arrhythmia, heart block or bradycardia requiring permanent pacemaker, aortic dissection or other major vascular complication, pleural effusions or other delayed complications related to continued congestive heart failure, and other late complications related to valve replacement including structural valve deterioration and failure, thrombosis, endocarditis, or paravalvular leak. All of his questions have been addressed.    Valentina Gu. Roxy Manns, MD 11/02/2016 3:38 PM

## 2016-11-03 ENCOUNTER — Encounter (HOSPITAL_COMMUNITY)
Admission: RE | Disposition: A | Discharge status: Home / Self Care | Payer: Self-pay | Source: Ambulatory Visit | Attending: Thoracic Surgery (Cardiothoracic Vascular Surgery)

## 2016-11-03 ENCOUNTER — Inpatient Hospital Stay (HOSPITAL_COMMUNITY): Payer: PPO | Admitting: Certified Registered Nurse Anesthetist

## 2016-11-03 ENCOUNTER — Inpatient Hospital Stay (HOSPITAL_COMMUNITY): Payer: PPO

## 2016-11-03 ENCOUNTER — Inpatient Hospital Stay (HOSPITAL_COMMUNITY)
Admission: RE | Admit: 2016-11-03 | Discharge: 2016-11-09 | DRG: 219 | Disposition: A | Discharge status: Home / Self Care | Payer: PPO | Source: Ambulatory Visit | Attending: Thoracic Surgery (Cardiothoracic Vascular Surgery) | Admitting: Thoracic Surgery (Cardiothoracic Vascular Surgery)

## 2016-11-03 ENCOUNTER — Encounter (HOSPITAL_COMMUNITY): Payer: Self-pay | Admitting: *Deleted

## 2016-11-03 DIAGNOSIS — I341 Nonrheumatic mitral (valve) prolapse: Secondary | ICD-10-CM | POA: Diagnosis not present

## 2016-11-03 DIAGNOSIS — I714 Abdominal aortic aneurysm, without rupture: Secondary | ICD-10-CM | POA: Diagnosis not present

## 2016-11-03 DIAGNOSIS — Z87891 Personal history of nicotine dependence: Secondary | ICD-10-CM | POA: Diagnosis not present

## 2016-11-03 DIAGNOSIS — I7121 Aneurysm of the ascending aorta, without rupture: Secondary | ICD-10-CM

## 2016-11-03 DIAGNOSIS — I272 Pulmonary hypertension, unspecified: Secondary | ICD-10-CM | POA: Diagnosis present

## 2016-11-03 DIAGNOSIS — Z954 Presence of other heart-valve replacement: Secondary | ICD-10-CM

## 2016-11-03 DIAGNOSIS — Z85828 Personal history of other malignant neoplasm of skin: Secondary | ICD-10-CM

## 2016-11-03 DIAGNOSIS — K219 Gastro-esophageal reflux disease without esophagitis: Secondary | ICD-10-CM | POA: Diagnosis present

## 2016-11-03 DIAGNOSIS — Z8521 Personal history of malignant neoplasm of larynx: Secondary | ICD-10-CM | POA: Diagnosis not present

## 2016-11-03 DIAGNOSIS — J9811 Atelectasis: Secondary | ICD-10-CM | POA: Diagnosis not present

## 2016-11-03 DIAGNOSIS — I5042 Chronic combined systolic (congestive) and diastolic (congestive) heart failure: Secondary | ICD-10-CM | POA: Diagnosis present

## 2016-11-03 DIAGNOSIS — Q2543 Congenital aneurysm of aorta: Secondary | ICD-10-CM

## 2016-11-03 DIAGNOSIS — I719 Aortic aneurysm of unspecified site, without rupture: Secondary | ICD-10-CM | POA: Insufficient documentation

## 2016-11-03 DIAGNOSIS — I351 Nonrheumatic aortic (valve) insufficiency: Secondary | ICD-10-CM | POA: Diagnosis not present

## 2016-11-03 DIAGNOSIS — I511 Rupture of chordae tendineae, not elsewhere classified: Secondary | ICD-10-CM | POA: Diagnosis not present

## 2016-11-03 DIAGNOSIS — I340 Nonrheumatic mitral (valve) insufficiency: Secondary | ICD-10-CM

## 2016-11-03 DIAGNOSIS — E781 Pure hyperglyceridemia: Secondary | ICD-10-CM | POA: Diagnosis not present

## 2016-11-03 DIAGNOSIS — I358 Other nonrheumatic aortic valve disorders: Secondary | ICD-10-CM | POA: Diagnosis not present

## 2016-11-03 DIAGNOSIS — D62 Acute posthemorrhagic anemia: Secondary | ICD-10-CM | POA: Diagnosis not present

## 2016-11-03 DIAGNOSIS — R011 Cardiac murmur, unspecified: Secondary | ICD-10-CM | POA: Diagnosis not present

## 2016-11-03 DIAGNOSIS — I712 Thoracic aortic aneurysm, without rupture: Secondary | ICD-10-CM | POA: Diagnosis present

## 2016-11-03 DIAGNOSIS — Z79899 Other long term (current) drug therapy: Secondary | ICD-10-CM | POA: Diagnosis not present

## 2016-11-03 DIAGNOSIS — Q231 Congenital insufficiency of aortic valve: Secondary | ICD-10-CM | POA: Diagnosis not present

## 2016-11-03 DIAGNOSIS — Z9889 Other specified postprocedural states: Secondary | ICD-10-CM

## 2016-11-03 HISTORY — PX: MITRAL VALVE REPAIR: SHX2039

## 2016-11-03 HISTORY — DX: Aneurysm of the ascending aorta, without rupture: I71.21

## 2016-11-03 HISTORY — DX: Aortic aneurysm of unspecified site, without rupture: I71.9

## 2016-11-03 HISTORY — DX: Presence of other heart-valve replacement: Z95.4

## 2016-11-03 HISTORY — PX: BENTALL PROCEDURE: SHX5058

## 2016-11-03 HISTORY — PX: TEE WITHOUT CARDIOVERSION: SHX5443

## 2016-11-03 HISTORY — DX: Other specified postprocedural states: Z98.890

## 2016-11-03 LAB — CBC
HEMATOCRIT: 31.5 % — AB (ref 39.0–52.0)
HEMATOCRIT: 32.4 % — AB (ref 39.0–52.0)
HEMOGLOBIN: 11 g/dL — AB (ref 13.0–17.0)
Hemoglobin: 10.8 g/dL — ABNORMAL LOW (ref 13.0–17.0)
MCH: 28.3 pg (ref 26.0–34.0)
MCH: 27.9 pg (ref 26.0–34.0)
MCHC: 34.3 g/dL (ref 30.0–36.0)
MCHC: 34 g/dL (ref 30.0–36.0)
MCV: 82.5 fL (ref 78.0–100.0)
MCV: 82.2 fL (ref 78.0–100.0)
PLATELETS: 99 10*3/uL — AB (ref 150–400)
Platelets: 118 10*3/uL — ABNORMAL LOW (ref 150–400)
RBC: 3.82 MIL/uL — ABNORMAL LOW (ref 4.22–5.81)
RBC: 3.94 MIL/uL — AB (ref 4.22–5.81)
RDW: 13.5 % (ref 11.5–15.5)
RDW: 13.5 % (ref 11.5–15.5)
WBC: 8.7 10*3/uL (ref 4.0–10.5)
WBC: 12.4 10*3/uL — AB (ref 4.0–10.5)

## 2016-11-03 LAB — POCT I-STAT, CHEM 8
BUN: 13 mg/dL (ref 6–20)
BUN: 10 mg/dL (ref 6–20)
BUN: 12 mg/dL (ref 6–20)
BUN: 11 mg/dL (ref 6–20)
BUN: 13 mg/dL (ref 6–20)
BUN: 12 mg/dL (ref 6–20)
BUN: 11 mg/dL (ref 6–20)
CALCIUM ION: 1.24 mmol/L (ref 1.15–1.40)
CALCIUM ION: 0.99 mmol/L — AB (ref 1.15–1.40)
CALCIUM ION: 0.99 mmol/L — AB (ref 1.15–1.40)
CHLORIDE: 101 mmol/L (ref 101–111)
CHLORIDE: 100 mmol/L — AB (ref 101–111)
CHLORIDE: 96 mmol/L — AB (ref 101–111)
CHLORIDE: 101 mmol/L (ref 101–111)
CREATININE: 0.9 mg/dL (ref 0.61–1.24)
Calcium, Ion: 1.2 mmol/L (ref 1.15–1.40)
Calcium, Ion: 0.99 mmol/L — ABNORMAL LOW (ref 1.15–1.40)
Calcium, Ion: 0.98 mmol/L — ABNORMAL LOW (ref 1.15–1.40)
Calcium, Ion: 1.12 mmol/L — ABNORMAL LOW (ref 1.15–1.40)
Chloride: 103 mmol/L (ref 101–111)
Chloride: 103 mmol/L (ref 101–111)
Chloride: 108 mmol/L (ref 101–111)
Creatinine, Ser: 0.8 mg/dL (ref 0.61–1.24)
Creatinine, Ser: 0.4 mg/dL — ABNORMAL LOW (ref 0.61–1.24)
Creatinine, Ser: 0.7 mg/dL (ref 0.61–1.24)
Creatinine, Ser: 0.6 mg/dL — ABNORMAL LOW (ref 0.61–1.24)
Creatinine, Ser: 0.6 mg/dL — ABNORMAL LOW (ref 0.61–1.24)
Creatinine, Ser: 0.9 mg/dL (ref 0.61–1.24)
GLUCOSE: 103 mg/dL — AB (ref 65–99)
GLUCOSE: 142 mg/dL — AB (ref 65–99)
Glucose, Bld: 105 mg/dL — ABNORMAL HIGH (ref 65–99)
Glucose, Bld: 117 mg/dL — ABNORMAL HIGH (ref 65–99)
Glucose, Bld: 149 mg/dL — ABNORMAL HIGH (ref 65–99)
Glucose, Bld: 124 mg/dL — ABNORMAL HIGH (ref 65–99)
Glucose, Bld: 135 mg/dL — ABNORMAL HIGH (ref 65–99)
HCT: 33 % — ABNORMAL LOW (ref 39.0–52.0)
HCT: 26 % — ABNORMAL LOW (ref 39.0–52.0)
HCT: 22 % — ABNORMAL LOW (ref 39.0–52.0)
HCT: 21 % — ABNORMAL LOW (ref 39.0–52.0)
HEMATOCRIT: 32 % — AB (ref 39.0–52.0)
HEMATOCRIT: 25 % — AB (ref 39.0–52.0)
HEMATOCRIT: 29 % — AB (ref 39.0–52.0)
HEMOGLOBIN: 11.2 g/dL — AB (ref 13.0–17.0)
HEMOGLOBIN: 10.9 g/dL — AB (ref 13.0–17.0)
HEMOGLOBIN: 9.9 g/dL — AB (ref 13.0–17.0)
Hemoglobin: 8.8 g/dL — ABNORMAL LOW (ref 13.0–17.0)
Hemoglobin: 7.5 g/dL — ABNORMAL LOW (ref 13.0–17.0)
Hemoglobin: 7.1 g/dL — ABNORMAL LOW (ref 13.0–17.0)
Hemoglobin: 8.5 g/dL — ABNORMAL LOW (ref 13.0–17.0)
POTASSIUM: 3.9 mmol/L (ref 3.5–5.1)
POTASSIUM: 4 mmol/L (ref 3.5–5.1)
POTASSIUM: 4 mmol/L (ref 3.5–5.1)
POTASSIUM: 4.6 mmol/L (ref 3.5–5.1)
Potassium: 3.9 mmol/L (ref 3.5–5.1)
Potassium: 4.5 mmol/L (ref 3.5–5.1)
Potassium: 4.2 mmol/L (ref 3.5–5.1)
SODIUM: 140 mmol/L (ref 135–145)
SODIUM: 141 mmol/L (ref 135–145)
SODIUM: 131 mmol/L — AB (ref 135–145)
SODIUM: 143 mmol/L (ref 135–145)
Sodium: 141 mmol/L (ref 135–145)
Sodium: 133 mmol/L — ABNORMAL LOW (ref 135–145)
Sodium: 140 mmol/L (ref 135–145)
TCO2: 28 mmol/L (ref 0–100)
TCO2: 27 mmol/L (ref 0–100)
TCO2: 30 mmol/L (ref 0–100)
TCO2: 28 mmol/L (ref 0–100)
TCO2: 28 mmol/L (ref 0–100)
TCO2: 24 mmol/L (ref 0–100)
TCO2: 21 mmol/L (ref 0–100)

## 2016-11-03 LAB — POCT I-STAT 3, ART BLOOD GAS (G3+)
ACID-BASE EXCESS: 2 mmol/L (ref 0.0–2.0)
Acid-Base Excess: 1 mmol/L (ref 0.0–2.0)
Acid-Base Excess: 4 mmol/L — ABNORMAL HIGH (ref 0.0–2.0)
Acid-Base Excess: 1 mmol/L (ref 0.0–2.0)
Acid-base deficit: 3 mmol/L — ABNORMAL HIGH (ref 0.0–2.0)
BICARBONATE: 27.9 mmol/L (ref 20.0–28.0)
BICARBONATE: 26 mmol/L (ref 20.0–28.0)
Bicarbonate: 27.4 mmol/L (ref 20.0–28.0)
Bicarbonate: 25.3 mmol/L (ref 20.0–28.0)
Bicarbonate: 23.6 mmol/L (ref 20.0–28.0)
O2 SAT: 100 %
O2 SAT: 90 %
O2 Saturation: 100 %
O2 Saturation: 100 %
O2 Saturation: 97 %
PCO2 ART: 36.6 mmHg (ref 32.0–48.0)
PCO2 ART: 36.6 mmHg (ref 32.0–48.0)
PCO2 ART: 52.9 mmHg — AB (ref 32.0–48.0)
PH ART: 7.331 — AB (ref 7.350–7.450)
PH ART: 7.489 — AB (ref 7.350–7.450)
PH ART: 7.447 (ref 7.350–7.450)
PO2 ART: 159 mmHg — AB (ref 83.0–108.0)
PO2 ART: 86 mmHg (ref 83.0–108.0)
PO2 ART: 70 mmHg — AB (ref 83.0–108.0)
Patient temperature: 36.7
Patient temperature: 37.8
TCO2: 29 mmol/L (ref 0–100)
TCO2: 29 mmol/L (ref 0–100)
TCO2: 27 mmol/L (ref 0–100)
TCO2: 26 mmol/L (ref 0–100)
TCO2: 25 mmol/L (ref 0–100)
pCO2 arterial: 51.9 mmHg — ABNORMAL HIGH (ref 32.0–48.0)
pCO2 arterial: 34.5 mmHg (ref 32.0–48.0)
pH, Arterial: 7.485 — ABNORMAL HIGH (ref 7.350–7.450)
pH, Arterial: 7.262 — ABNORMAL LOW (ref 7.350–7.450)
pO2, Arterial: 291 mmHg — ABNORMAL HIGH (ref 83.0–108.0)
pO2, Arterial: 630 mmHg — ABNORMAL HIGH (ref 83.0–108.0)

## 2016-11-03 LAB — MAGNESIUM: MAGNESIUM: 2.7 mg/dL — AB (ref 1.7–2.4)

## 2016-11-03 LAB — PREPARE RBC (CROSSMATCH)

## 2016-11-03 LAB — HEMOGLOBIN AND HEMATOCRIT, BLOOD
HEMATOCRIT: 27.3 % — AB (ref 39.0–52.0)
Hemoglobin: 9.5 g/dL — ABNORMAL LOW (ref 13.0–17.0)

## 2016-11-03 LAB — PROTIME-INR
INR: 1.73
Prothrombin Time: 20.5 seconds — ABNORMAL HIGH (ref 11.4–15.2)

## 2016-11-03 LAB — CREATININE, SERUM
Creatinine, Ser: 0.97 mg/dL (ref 0.61–1.24)
GFR calc Af Amer: >60 mL/min (ref >60)
GFR calc non Af Amer: >60 mL/min (ref >60)

## 2016-11-03 LAB — POCT I-STAT 4, (NA,K, GLUC, HGB,HCT)
GLUCOSE: 102 mg/dL — AB (ref 65–99)
HEMATOCRIT: 28 % — AB (ref 39.0–52.0)
HEMOGLOBIN: 9.5 g/dL — AB (ref 13.0–17.0)
Potassium: 3.7 mmol/L (ref 3.5–5.1)
Sodium: 142 mmol/L (ref 135–145)

## 2016-11-03 LAB — GLUCOSE, CAPILLARY
GLUCOSE-CAPILLARY: 87 mg/dL (ref 65–99)
GLUCOSE-CAPILLARY: 108 mg/dL — AB (ref 65–99)
GLUCOSE-CAPILLARY: 149 mg/dL — AB (ref 65–99)
GLUCOSE-CAPILLARY: 142 mg/dL — AB (ref 65–99)
GLUCOSE-CAPILLARY: 135 mg/dL — AB (ref 65–99)
GLUCOSE-CAPILLARY: 129 mg/dL — AB (ref 65–99)
Glucose-Capillary: 140 mg/dL — ABNORMAL HIGH (ref 65–99)

## 2016-11-03 LAB — APTT: APTT: 39 s — AB (ref 24–36)

## 2016-11-03 LAB — PLATELET COUNT: PLATELETS: 92 10*3/uL — AB (ref 150–400)

## 2016-11-03 SURGERY — BENTALL PROCEDURE
Anesthesia: General | Site: Chest

## 2016-11-03 MED ORDER — ACETAMINOPHEN 160 MG/5ML PO SOLN
1000.0000 mg | Freq: Four times a day (QID) | ORAL | Status: DC
  Administered 2016-11-03: 1000 mg
  Filled 2016-11-03: qty 40.6

## 2016-11-03 MED ORDER — INSULIN REGULAR BOLUS VIA INFUSION
0.0000 [IU] | Freq: Three times a day (TID) | INTRAVENOUS | Status: DC
  Filled 2016-11-03: qty 10

## 2016-11-03 MED ORDER — PROTAMINE SULFATE 10 MG/ML IV SOLN
INTRAVENOUS | Status: AC
  Filled 2016-11-03: qty 15

## 2016-11-03 MED ORDER — SODIUM CHLORIDE 0.9 % IV SOLN: INTRAVENOUS | Status: DC

## 2016-11-03 MED ORDER — FAMOTIDINE IN NACL 20-0.9 MG/50ML-% IV SOLN
20.0000 mg | Freq: Two times a day (BID) | INTRAVENOUS | Status: AC
  Administered 2016-11-03 (×2): 20 mg via INTRAVENOUS
  Filled 2016-11-03: qty 50

## 2016-11-03 MED ORDER — PANTOPRAZOLE SODIUM 40 MG PO TBEC
40.0000 mg | DELAYED_RELEASE_TABLET | Freq: Every day | ORAL | Status: DC
  Administered 2016-11-04 – 2016-11-09 (×6): 40 mg via ORAL
  Filled 2016-11-03 (×6): qty 1

## 2016-11-03 MED ORDER — SUCCINYLCHOLINE CHLORIDE 200 MG/10ML IV SOSY
PREFILLED_SYRINGE | INTRAVENOUS | Status: AC
  Filled 2016-11-03: qty 10

## 2016-11-03 MED ORDER — LACTATED RINGERS IV SOLN
INTRAVENOUS | Status: DC | PRN
  Administered 2016-11-03 (×2): via INTRAVENOUS

## 2016-11-03 MED ORDER — ARTIFICIAL TEARS OP OINT
TOPICAL_OINTMENT | OPHTHALMIC | Status: DC | PRN
  Administered 2016-11-03: 1 via OPHTHALMIC

## 2016-11-03 MED ORDER — ROCURONIUM BROMIDE 100 MG/10ML IV SOLN
INTRAVENOUS | Status: DC | PRN
  Administered 2016-11-03: 50 mg via INTRAVENOUS
  Administered 2016-11-03: 100 mg via INTRAVENOUS

## 2016-11-03 MED ORDER — VANCOMYCIN HCL IN DEXTROSE 1-5 GM/200ML-% IV SOLN
1000.0000 mg | Freq: Once | INTRAVENOUS | Status: AC
  Administered 2016-11-03: 1000 mg via INTRAVENOUS
  Filled 2016-11-03: qty 200

## 2016-11-03 MED ORDER — SODIUM CHLORIDE 0.9 % IJ SOLN
OROMUCOSAL | Status: DC | PRN
  Administered 2016-11-03 (×3): 4 mL via TOPICAL

## 2016-11-03 MED ORDER — NOREPINEPHRINE BITARTRATE 1 MG/ML IV SOLN
0.0000 ug/min | INTRAVENOUS | Status: DC
  Filled 2016-11-03: qty 4

## 2016-11-03 MED ORDER — PROPOFOL 10 MG/ML IV BOLUS
INTRAVENOUS | Status: DC | PRN
  Administered 2016-11-03 (×4): 50 mg via INTRAVENOUS

## 2016-11-03 MED ORDER — CHLORHEXIDINE GLUCONATE 0.12% ORAL RINSE (MEDLINE KIT)
15.0000 mL | Freq: Two times a day (BID) | OROMUCOSAL | Status: DC
  Administered 2016-11-03 – 2016-11-04 (×2): 15 mL via OROMUCOSAL

## 2016-11-03 MED ORDER — LACTATED RINGERS IV SOLN: 500.0000 mL | Freq: Once | INTRAVENOUS | Status: DC | PRN

## 2016-11-03 MED ORDER — DEXTROSE 5 % IV SOLN
1.5000 g | Freq: Two times a day (BID) | INTRAVENOUS | Status: AC
  Administered 2016-11-04 – 2016-11-05 (×4): 1.5 g via INTRAVENOUS
  Filled 2016-11-03 (×4): qty 1.5

## 2016-11-03 MED ORDER — METOPROLOL TARTRATE 5 MG/5ML IV SOLN: 2.5000 mg | INTRAVENOUS | Status: DC | PRN

## 2016-11-03 MED ORDER — METOPROLOL TARTRATE 12.5 MG HALF TABLET: 12.5000 mg | ORAL_TABLET | Freq: Two times a day (BID) | ORAL | Status: DC

## 2016-11-03 MED ORDER — SODIUM CHLORIDE 0.9% FLUSH: 3.0000 mL | INTRAVENOUS | Status: DC | PRN

## 2016-11-03 MED ORDER — TRAMADOL HCL 50 MG PO TABS
50.0000 mg | ORAL_TABLET | ORAL | Status: DC | PRN
  Administered 2016-11-05: 50 mg via ORAL
  Administered 2016-11-06: 100 mg via ORAL
  Filled 2016-11-03: qty 2
  Filled 2016-11-03: qty 1

## 2016-11-03 MED ORDER — HEPARIN SODIUM (PORCINE) 1000 UNIT/ML IJ SOLN: INTRAMUSCULAR | Status: DC | PRN

## 2016-11-03 MED ORDER — ROCURONIUM BROMIDE 10 MG/ML (PF) SYRINGE
PREFILLED_SYRINGE | INTRAVENOUS | Status: DC | PRN
  Administered 2016-11-03: 50 mg via INTRAVENOUS
  Administered 2016-11-03: 30 mg via INTRAVENOUS

## 2016-11-03 MED ORDER — MORPHINE SULFATE (PF) 2 MG/ML IV SOLN
1.0000 mg | INTRAVENOUS | Status: DC | PRN
  Filled 2016-11-03 (×2): qty 1
  Filled 2016-11-03: qty 2

## 2016-11-03 MED ORDER — POTASSIUM CHLORIDE 10 MEQ/50ML IV SOLN
10.0000 meq | Freq: Once | INTRAVENOUS | Status: AC
  Administered 2016-11-03: 10 meq via INTRAVENOUS

## 2016-11-03 MED ORDER — PROTAMINE SULFATE 10 MG/ML IV SOLN
INTRAVENOUS | Status: DC | PRN
  Administered 2016-11-03: 3790 mg via INTRAVENOUS
  Administered 2016-11-03: 10 mg via INTRAVENOUS

## 2016-11-03 MED ORDER — BISACODYL 10 MG RE SUPP: 10.0000 mg | Freq: Every day | RECTAL | Status: DC

## 2016-11-03 MED ORDER — LACTATED RINGERS IV SOLN: INTRAVENOUS | Status: DC

## 2016-11-03 MED ORDER — MIDAZOLAM HCL 5 MG/5ML IJ SOLN
INTRAMUSCULAR | Status: DC | PRN
  Administered 2016-11-03: 1 mg via INTRAVENOUS
  Administered 2016-11-03: 2 mg via INTRAVENOUS
  Administered 2016-11-03: 1 mg via INTRAVENOUS
  Administered 2016-11-03: 3 mg via INTRAVENOUS

## 2016-11-03 MED ORDER — CHLORHEXIDINE GLUCONATE 0.12 % MT SOLN
15.0000 mL | OROMUCOSAL | Status: AC
  Administered 2016-11-03: 15 mL via OROMUCOSAL
  Filled 2016-11-03: qty 15

## 2016-11-03 MED ORDER — MORPHINE SULFATE (PF) 2 MG/ML IV SOLN
1.0000 mg | INTRAVENOUS | Status: DC | PRN
  Administered 2016-11-03 – 2016-11-05 (×8): 2 mg via INTRAVENOUS
  Filled 2016-11-03 (×4): qty 1

## 2016-11-03 MED ORDER — DOCUSATE SODIUM 100 MG PO CAPS
200.0000 mg | ORAL_CAPSULE | Freq: Every day | ORAL | Status: DC
  Administered 2016-11-04 – 2016-11-09 (×6): 200 mg via ORAL
  Filled 2016-11-03 (×6): qty 2

## 2016-11-03 MED ORDER — ACETAMINOPHEN 500 MG PO TABS
1000.0000 mg | ORAL_TABLET | Freq: Four times a day (QID) | ORAL | Status: AC
  Administered 2016-11-04 – 2016-11-08 (×17): 1000 mg via ORAL
  Filled 2016-11-03 (×18): qty 2

## 2016-11-03 MED ORDER — ORAL CARE MOUTH RINSE
15.0000 mL | Freq: Four times a day (QID) | OROMUCOSAL | Status: DC
  Administered 2016-11-04 (×2): 15 mL via OROMUCOSAL

## 2016-11-03 MED ORDER — DEXMEDETOMIDINE HCL IN NACL 200 MCG/50ML IV SOLN
0.0000 ug/kg/h | INTRAVENOUS | Status: DC
  Administered 2016-11-03: 0.7 ug/kg/h via INTRAVENOUS
  Administered 2016-11-04: 0.4 ug/kg/h via INTRAVENOUS
  Filled 2016-11-03 (×2): qty 50

## 2016-11-03 MED ORDER — PHENYLEPHRINE HCL 10 MG/ML IJ SOLN
INTRAMUSCULAR | Status: DC | PRN
  Administered 2016-11-03: 80 ug via INTRAVENOUS

## 2016-11-03 MED ORDER — ASPIRIN 81 MG PO CHEW: 324.0000 mg | CHEWABLE_TABLET | Freq: Every day | ORAL | Status: DC

## 2016-11-03 MED ORDER — FENTANYL CITRATE (PF) 250 MCG/5ML IJ SOLN
INTRAMUSCULAR | Status: AC
  Filled 2016-11-03: qty 10

## 2016-11-03 MED ORDER — CHLORHEXIDINE GLUCONATE 4 % EX LIQD: 30.0000 mL | CUTANEOUS | Status: DC

## 2016-11-03 MED ORDER — SODIUM CHLORIDE 0.9 % IV SOLN
INTRAVENOUS | Status: AC
  Administered 2016-11-03: 21:00:00 via INTRAVENOUS
  Filled 2016-11-03 (×3): qty 2.5

## 2016-11-03 MED ORDER — METOPROLOL TARTRATE 25 MG/10 ML ORAL SUSPENSION: 12.5000 mg | Freq: Two times a day (BID) | ORAL | Status: DC

## 2016-11-03 MED ORDER — PROPOFOL 10 MG/ML IV BOLUS
INTRAVENOUS | Status: AC
  Filled 2016-11-03: qty 20

## 2016-11-03 MED ORDER — NITROGLYCERIN IN D5W 200-5 MCG/ML-% IV SOLN: 0.0000 ug/min | INTRAVENOUS | Status: DC

## 2016-11-03 MED ORDER — SODIUM CHLORIDE 0.9 % IR SOLN
Status: DC | PRN
  Administered 2016-11-03: 5000 mL

## 2016-11-03 MED ORDER — MIDAZOLAM HCL 10 MG/2ML IJ SOLN
INTRAMUSCULAR | Status: AC
  Filled 2016-11-03: qty 2

## 2016-11-03 MED ORDER — ALBUMIN HUMAN 5 % IV SOLN
250.0000 mL | INTRAVENOUS | Status: AC | PRN
  Administered 2016-11-03 (×2): 250 mL via INTRAVENOUS

## 2016-11-03 MED ORDER — FENTANYL CITRATE (PF) 250 MCG/5ML IJ SOLN
INTRAMUSCULAR | Status: AC
  Filled 2016-11-03: qty 25

## 2016-11-03 MED ORDER — ASPIRIN EC 325 MG PO TBEC
325.0000 mg | DELAYED_RELEASE_TABLET | Freq: Every day | ORAL | Status: DC
  Administered 2016-11-04: 325 mg via ORAL
  Filled 2016-11-03: qty 1

## 2016-11-03 MED ORDER — ACETAMINOPHEN 650 MG RE SUPP
650.0000 mg | Freq: Once | RECTAL | Status: AC
  Administered 2016-11-03: 650 mg via RECTAL
  Filled 2016-11-03: qty 1

## 2016-11-03 MED ORDER — SODIUM CHLORIDE 0.45 % IV SOLN: INTRAVENOUS | Status: DC | PRN

## 2016-11-03 MED ORDER — ACETAMINOPHEN 160 MG/5ML PO SOLN: 650.0000 mg | Freq: Once | ORAL | Status: AC

## 2016-11-03 MED ORDER — PHENYLEPHRINE HCL 10 MG/ML IJ SOLN
0.0000 ug/min | INTRAVENOUS | Status: DC
  Administered 2016-11-03: 35 ug/min via INTRAVENOUS
  Filled 2016-11-03 (×4): qty 2

## 2016-11-03 MED ORDER — GLYCOPYRROLATE 0.2 MG/ML IV SOSY
PREFILLED_SYRINGE | INTRAVENOUS | Status: AC
  Filled 2016-11-03: qty 3

## 2016-11-03 MED ORDER — MIDAZOLAM HCL 2 MG/2ML IJ SOLN: 2.0000 mg | INTRAMUSCULAR | Status: DC | PRN

## 2016-11-03 MED ORDER — HEPARIN SODIUM (PORCINE) 1000 UNIT/ML IJ SOLN
INTRAMUSCULAR | Status: DC | PRN
  Administered 2016-11-03: 38000 [IU] via INTRAVENOUS

## 2016-11-03 MED ORDER — ROCURONIUM BROMIDE 10 MG/ML (PF) SYRINGE
PREFILLED_SYRINGE | INTRAVENOUS | Status: AC
  Filled 2016-11-03: qty 10

## 2016-11-03 MED ORDER — MILRINONE LACTATE IN DEXTROSE 20-5 MG/100ML-% IV SOLN
0.2500 ug/kg/min | INTRAVENOUS | Status: DC
  Administered 2016-11-03: .2 ug/kg/min via INTRAVENOUS
  Filled 2016-11-03: qty 100

## 2016-11-03 MED ORDER — LACTATED RINGERS IV SOLN
INTRAVENOUS | Status: DC | PRN
  Administered 2016-11-03: 08:00:00 via INTRAVENOUS

## 2016-11-03 MED ORDER — CHLORHEXIDINE GLUCONATE 0.12 % MT SOLN
15.0000 mL | Freq: Once | OROMUCOSAL | Status: AC
  Administered 2016-11-03: 15 mL via OROMUCOSAL
  Filled 2016-11-03: qty 15

## 2016-11-03 MED ORDER — HEPARIN SODIUM (PORCINE) 1000 UNIT/ML IJ SOLN
INTRAMUSCULAR | Status: AC
  Filled 2016-11-03: qty 1

## 2016-11-03 MED ORDER — LACTATED RINGERS IV SOLN
INTRAVENOUS | Status: DC | PRN
  Administered 2016-11-03 (×2): via INTRAVENOUS

## 2016-11-03 MED ORDER — POTASSIUM CHLORIDE 10 MEQ/50ML IV SOLN
10.0000 meq | INTRAVENOUS | Status: AC
  Administered 2016-11-03 (×3): 10 meq via INTRAVENOUS

## 2016-11-03 MED ORDER — ALBUMIN HUMAN 5 % IV SOLN
INTRAVENOUS | Status: DC | PRN
  Administered 2016-11-03 (×2): via INTRAVENOUS

## 2016-11-03 MED ORDER — ONDANSETRON HCL 4 MG/2ML IJ SOLN
4.0000 mg | Freq: Four times a day (QID) | INTRAMUSCULAR | Status: DC | PRN
  Administered 2016-11-04 – 2016-11-05 (×2): 4 mg via INTRAVENOUS
  Filled 2016-11-03 (×2): qty 2

## 2016-11-03 MED ORDER — OXYCODONE HCL 5 MG PO TABS
5.0000 mg | ORAL_TABLET | ORAL | Status: DC | PRN
  Administered 2016-11-04 – 2016-11-05 (×2): 10 mg via ORAL
  Administered 2016-11-09: 5 mg via ORAL
  Filled 2016-11-03 (×2): qty 2
  Filled 2016-11-03: qty 1

## 2016-11-03 MED ORDER — MILRINONE LACTATE IN DEXTROSE 20-5 MG/100ML-% IV SOLN
0.0000 ug/kg/min | INTRAVENOUS | Status: DC
  Administered 2016-11-04: 0.2 ug/kg/min via INTRAVENOUS
  Filled 2016-11-03: qty 100

## 2016-11-03 MED ORDER — MAGNESIUM SULFATE 4 GM/100ML IV SOLN
4.0000 g | Freq: Once | INTRAVENOUS | Status: AC
  Administered 2016-11-03: 4 g via INTRAVENOUS
  Filled 2016-11-03: qty 100

## 2016-11-03 MED ORDER — SODIUM CHLORIDE 0.9% FLUSH
3.0000 mL | Freq: Two times a day (BID) | INTRAVENOUS | Status: DC
  Administered 2016-11-04 – 2016-11-06 (×2): 3 mL via INTRAVENOUS

## 2016-11-03 MED ORDER — FENTANYL CITRATE (PF) 250 MCG/5ML IJ SOLN
INTRAMUSCULAR | Status: DC | PRN
  Administered 2016-11-03 (×6): 150 ug via INTRAVENOUS
  Administered 2016-11-03 (×3): 100 ug via INTRAVENOUS
  Administered 2016-11-03 (×2): 150 ug via INTRAVENOUS
  Administered 2016-11-03: 100 ug via INTRAVENOUS
  Administered 2016-11-03: 150 ug via INTRAVENOUS

## 2016-11-03 MED ORDER — SODIUM CHLORIDE 0.9 % IV SOLN: 250.0000 mL | INTRAVENOUS | Status: DC

## 2016-11-03 MED ORDER — EPHEDRINE 5 MG/ML INJ
INTRAVENOUS | Status: AC
  Filled 2016-11-03: qty 10

## 2016-11-03 MED ORDER — BISACODYL 5 MG PO TBEC
10.0000 mg | DELAYED_RELEASE_TABLET | Freq: Every day | ORAL | Status: DC
  Administered 2016-11-05 – 2016-11-09 (×4): 10 mg via ORAL
  Filled 2016-11-03 (×5): qty 2

## 2016-11-03 MED FILL — Potassium Chloride Inj 2 mEq/ML: INTRAVENOUS | Qty: 40 | Status: AC

## 2016-11-03 MED FILL — Heparin Sodium (Porcine) Inj 1000 Unit/ML: INTRAMUSCULAR | Qty: 30 | Status: AC

## 2016-11-03 MED FILL — Magnesium Sulfate Inj 50%: INTRAMUSCULAR | Qty: 10 | Status: AC

## 2016-11-03 SURGICAL SUPPLY — 137 items
ADAPTER CARDIO PERF ANTE/RETRO (ADAPTER) ×6 IMPLANT
APPLICATOR COTTON TIP 6IN STRL (MISCELLANEOUS) IMPLANT
APPLICATOR TIP BIOGLUE STANDRD (MISCELLANEOUS) IMPLANT
APPLICATOR TIP COSEAL (VASCULAR PRODUCTS) ×3 IMPLANT
ASCENDING AORTIC VALVE 27/29 (Prosthesis & Implant Heart) ×3 IMPLANT
ATTRACTOMAT 16X20 MAGNETIC DRP (DRAPES) ×3 IMPLANT
BAG DECANTER FOR FLEXI CONT (MISCELLANEOUS) ×6 IMPLANT
BLADE STERNUM SYSTEM 6 (BLADE) ×6 IMPLANT
BLADE SURG 11 STRL SS (BLADE) ×6 IMPLANT
BOOT SUTURE AID YELLOW STND (SUTURE) ×3 IMPLANT
CANISTER SUCTION 2500CC (MISCELLANEOUS) ×6 IMPLANT
CANN PRFSN 3/8X14X24FR PCFC (MISCELLANEOUS)
CANN PRFSN 3/8XCNCT ST RT ANG (MISCELLANEOUS)
CANNULA AORTIC ROOT 9FR (CANNULA) ×3 IMPLANT
CANNULA EZ GLIDE AORTIC 21FR (CANNULA) ×6 IMPLANT
CANNULA FEM VENOUS REMOTE 22FR (CANNULA) ×3 IMPLANT
CANNULA GUNDRY RCSP 15FR (MISCELLANEOUS) ×6 IMPLANT
CANNULA PRFSN 3/8X14X24FR PCFC (MISCELLANEOUS) IMPLANT
CANNULA PRFSN 3/8XCNCT RT ANG (MISCELLANEOUS) IMPLANT
CANNULA SUMP PERICARDIAL (CANNULA) ×3 IMPLANT
CANNULA VEN MTL TIP RT (MISCELLANEOUS)
CANNULA VENNOUS METAL TIP 20FR (CANNULA) ×3 IMPLANT
CATH CPB KIT OWEN (MISCELLANEOUS) IMPLANT
CATH FOLEY 2WAY SLVR  5CC 14FR (CATHETERS)
CATH FOLEY 2WAY SLVR 5CC 14FR (CATHETERS) IMPLANT
CATH HEART VENT LEFT (CATHETERS) ×2 IMPLANT
CATH ROBINSON RED A/P 18FR (CATHETERS) IMPLANT
CATH THORACIC 28FR RT ANG (CATHETERS) IMPLANT
CATH THORACIC 36FR (CATHETERS) ×3 IMPLANT
CATH THORACIC 36FR RT ANG (CATHETERS) ×3 IMPLANT
CAUTERY EYE LOW TEMP 1300F FIN (OPHTHALMIC RELATED) ×3 IMPLANT
CAUTERY SURG HI TEMP FINE TIP (MISCELLANEOUS) ×3 IMPLANT
CLIP FOGARTY SPRING 6M (CLIP) IMPLANT
CONN 1/2X1/2X1/2  BEN (MISCELLANEOUS) ×2
CONN 1/2X1/2X1/2 BEN (MISCELLANEOUS) ×4 IMPLANT
CONN 3/8X1/2 ST GISH (MISCELLANEOUS) ×9 IMPLANT
CONN ST 1/4X3/8  BEN (MISCELLANEOUS) ×3
CONN ST 1/4X3/8 BEN (MISCELLANEOUS) ×6 IMPLANT
CONT SPEC 4OZ CLIKSEAL STRL BL (MISCELLANEOUS) ×9 IMPLANT
COVER PROBE W GEL 5X96 (DRAPES) ×3 IMPLANT
COVER SURGICAL LIGHT HANDLE (MISCELLANEOUS) ×15 IMPLANT
CRADLE DONUT ADULT HEAD (MISCELLANEOUS) ×6 IMPLANT
DEVICE SUT CK QUICK LOAD MINI (Prosthesis & Implant Heart) ×6 IMPLANT
DRAIN CHANNEL 32F RND 10.7 FF (WOUND CARE) ×6 IMPLANT
DRAPE BILATERAL SPLIT (DRAPES) IMPLANT
DRAPE CARDIOVASCULAR INCISE (DRAPES)
DRAPE CV SPLIT W-CLR ANES SCRN (DRAPES) IMPLANT
DRAPE INCISE IOBAN 66X45 STRL (DRAPES) ×9 IMPLANT
DRAPE SLUSH/WARMER DISC (DRAPES) ×3 IMPLANT
DRAPE SRG 135X102X78XABS (DRAPES) IMPLANT
DRSG COVADERM 4X14 (GAUZE/BANDAGES/DRESSINGS) ×3 IMPLANT
ELECT REM PT RETURN 9FT ADLT (ELECTROSURGICAL) ×6
ELECTRODE REM PT RTRN 9FT ADLT (ELECTROSURGICAL) ×4 IMPLANT
FELT TEFLON 1X6 (MISCELLANEOUS) ×9 IMPLANT
GAUZE SPONGE 4X4 12PLY STRL (GAUZE/BANDAGES/DRESSINGS) ×12 IMPLANT
GLOVE BIO SURGEON STRL SZ 6 (GLOVE) ×9 IMPLANT
GLOVE BIO SURGEON STRL SZ 6.5 (GLOVE) ×15 IMPLANT
GLOVE BIO SURGEON STRL SZ7 (GLOVE) IMPLANT
GLOVE BIO SURGEON STRL SZ7.5 (GLOVE) IMPLANT
GLOVE ORTHO TXT STRL SZ7.5 (GLOVE) ×9 IMPLANT
GOWN STRL REUS W/ TWL LRG LVL3 (GOWN DISPOSABLE) ×16 IMPLANT
GOWN STRL REUS W/TWL LRG LVL3 (GOWN DISPOSABLE) ×8
HEMOSTAT POWDER SURGIFOAM 1G (HEMOSTASIS) ×15 IMPLANT
INSERT FOGARTY XLG (MISCELLANEOUS) ×3 IMPLANT
KIT BASIN OR (CUSTOM PROCEDURE TRAY) ×3 IMPLANT
KIT DILATOR VASC 18G NDL (KITS) ×3 IMPLANT
KIT DRAINAGE VACCUM ASSIST (KITS) ×3 IMPLANT
KIT ROOM TURNOVER OR (KITS) ×3 IMPLANT
KIT SUCTION CATH 14FR (SUCTIONS) ×24 IMPLANT
KIT SUT CK MINI COMBO 4X17 (Prosthesis & Implant Heart) ×3 IMPLANT
LEAD PACING MYOCARDI (MISCELLANEOUS) ×3 IMPLANT
LINE VENT (MISCELLANEOUS) ×3 IMPLANT
LOOP VESSEL SUPERMAXI WHITE (MISCELLANEOUS) ×3 IMPLANT
MARKER GRAFT CORONARY BYPASS (MISCELLANEOUS) IMPLANT
NS IRRIG 1000ML POUR BTL (IV SOLUTION) ×15 IMPLANT
PACK OPEN HEART (CUSTOM PROCEDURE TRAY) ×3 IMPLANT
PAD ARMBOARD 7.5X6 YLW CONV (MISCELLANEOUS) ×12 IMPLANT
PEN SKIN MARKING BROAD (MISCELLANEOUS) ×3 IMPLANT
RING MITRAL MEMO 3D 28MM SMD28 (Prosthesis & Implant Heart) ×3 IMPLANT
SEALANT SURG COSEAL 8ML (VASCULAR PRODUCTS) ×3 IMPLANT
SET CARDIOPLEGIA MPS 5001102 (MISCELLANEOUS) ×3 IMPLANT
SET IRRIG TUBING LAPAROSCOPIC (IRRIGATION / IRRIGATOR) ×6 IMPLANT
SET VEIN GRAFT PERF (SET/KITS/TRAYS/PACK) ×3 IMPLANT
SPONGE GAUZE 4X4 12PLY STER LF (GAUZE/BANDAGES/DRESSINGS) ×3 IMPLANT
SUCKER INTRACARDIAC WEIGHTED (SUCKER) ×3 IMPLANT
SUT BONE WAX W31G (SUTURE) ×3 IMPLANT
SUT ETHIBON 2 0 V 52N 30 (SUTURE) ×9 IMPLANT
SUT ETHIBON EXCEL 2-0 V-5 (SUTURE) IMPLANT
SUT ETHIBOND (SUTURE) ×6 IMPLANT
SUT ETHIBOND 2 0 SH (SUTURE) ×12 IMPLANT
SUT ETHIBOND 2 0 SH 36X2 (SUTURE) ×12 IMPLANT
SUT ETHIBOND 2 0 V4 (SUTURE) IMPLANT
SUT ETHIBOND 2 0V4 GREEN (SUTURE) IMPLANT
SUT ETHIBOND 2-0 RB-1 WHT (SUTURE) ×6 IMPLANT
SUT ETHIBOND 4 0 RB 1 (SUTURE) IMPLANT
SUT ETHIBOND 4 0 TF (SUTURE) IMPLANT
SUT ETHIBOND 5 0 C 1 30 (SUTURE) ×3 IMPLANT
SUT ETHIBOND V-5 VALVE (SUTURE) IMPLANT
SUT ETHIBOND X763 2 0 SH 1 (SUTURE) ×9 IMPLANT
SUT MNCRL AB 3-0 PS2 18 (SUTURE) ×6 IMPLANT
SUT PDS AB 1 CTX 36 (SUTURE) ×6 IMPLANT
SUT PROLENE 3 0 SH 1 (SUTURE) ×3 IMPLANT
SUT PROLENE 3 0 SH DA (SUTURE) ×3 IMPLANT
SUT PROLENE 4 0 RB 1 (SUTURE) ×10
SUT PROLENE 4 0 SH DA (SUTURE) ×9 IMPLANT
SUT PROLENE 4-0 RB1 .5 CRCL 36 (SUTURE) ×20 IMPLANT
SUT PROLENE 5 0 C 1 36 (SUTURE) ×18 IMPLANT
SUT PROLENE 6 0 C 1 30 (SUTURE) ×6 IMPLANT
SUT PTFE CHORD X 20MM (SUTURE) ×3 IMPLANT
SUT SILK  1 MH (SUTURE) ×7
SUT SILK 1 MH (SUTURE) ×14 IMPLANT
SUT SILK 2 0 SH CR/8 (SUTURE) ×6 IMPLANT
SUT SILK 2 0 TIES 10X30 (SUTURE) ×6 IMPLANT
SUT SILK 3 0 SH CR/8 (SUTURE) ×3 IMPLANT
SUT SILK 4 0 TIE 10X30 (SUTURE) ×6 IMPLANT
SUT STEEL 6MS V (SUTURE) ×3 IMPLANT
SUT STEEL STERNAL CCS#1 18IN (SUTURE) IMPLANT
SUT STEEL SZ 6 DBL 3X14 BALL (SUTURE) IMPLANT
SUT VIC AB 2-0 CTX 27 (SUTURE) ×9 IMPLANT
SUT VIC AB 3-0 X1 27 (SUTURE) ×6 IMPLANT
SUTURE E-PAK OPEN HEART (SUTURE) IMPLANT
SYR 10ML KIT SKIN ADHESIVE (MISCELLANEOUS) IMPLANT
SYSTEM SAHARA CHEST DRAIN ATS (WOUND CARE) ×3 IMPLANT
TAPE CLOTH SURG 4X10 WHT LF (GAUZE/BANDAGES/DRESSINGS) ×3 IMPLANT
TAPE PAPER 2X10 WHT MICROPORE (GAUZE/BANDAGES/DRESSINGS) ×3 IMPLANT
TOWEL OR 17X24 6PK STRL BLUE (TOWEL DISPOSABLE) ×3 IMPLANT
TOWEL OR 17X26 10 PK STRL BLUE (TOWEL DISPOSABLE) ×6 IMPLANT
TRAY FOLEY IC TEMP SENS 14FR (CATHETERS) IMPLANT
TRAY FOLEY IC TEMP SENS 16FR (CATHETERS) ×3 IMPLANT
TUBE CONNECTING 12X1/4 (SUCTIONS) ×3 IMPLANT
TUBE SUCT INTRACARD DLP 20F (MISCELLANEOUS) ×3 IMPLANT
TUBING INSUFFLATION 10FT LAP (TUBING) IMPLANT
UNDERPAD 30X30 (UNDERPADS AND DIAPERS) ×3 IMPLANT
VALVE AORTIC ASCENDING 27/29 (Prosthesis & Implant Heart) ×2 IMPLANT
VENT LEFT HEART 12002 (CATHETERS) ×3
WATER STERILE IRR 1000ML POUR (IV SOLUTION) ×6 IMPLANT
YANKAUER SUCT BULB TIP NO VENT (SUCTIONS) ×3 IMPLANT

## 2016-11-03 NOTE — Interval H&P Note (Signed)
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 After consideration of risks, benefits and other options for treatment, the patient has consented to  Procedure(s): BENTALL PROCEDURE AORTIC ROOT REPLACEMENT (N/A) MITRAL VALVE REPAIR (MVR) (N/A) TRANSESOPHAGEAL ECHOCARDIOGRAM (TEE) (N/A) as a surgical intervention .  The patient's history has been reviewed, patient examined, no change in status, stable for surgery.  I have reviewed the patient's chart and labs.  Questions were answered to the patient's satisfaction.     Rexene Alberts

## 2016-11-03 NOTE — Care Management Note (Signed)
Case Management Note  Patient Details  Name: Kenneth Ryan MRN: [id number] Date of Birth: 09-22-1946  Subjective/Objective:   S/p    PROCEDURE:  TRANSESOPHAGEAL ECHOCARDIOGRAM (TEE), MEDIAN STERNOTOMY for BENTALL,  MITRAL VALVE REPAIR   Action/Plan:  PTA independent from home with wife.  CM will continue to follow for discharge needs   Expected Discharge Date:                  Expected Discharge Plan:  Home/Self Care  In-House Referral:     Discharge planning Services  CM Consult  Post Acute Care Choice:    Choice offered to:     DME Arranged:    DME Agency:     HH Arranged:    HH Agency:     Status of Service:  In process, will continue to follow  If discussed at Long Length of Stay Meetings, dates discussed:    Additional Comments:  Maryclare Labrador, RN 11/03/2016, 3:18 PM

## 2016-11-03 NOTE — Progress Notes (Signed)
RT NOTE:  Rapid Wean started. Pt awake, following commands.

## 2016-11-03 NOTE — OR Nursing (Signed)
13:45 - 45 minute call to SICU

## 2016-11-03 NOTE — Progress Notes (Signed)
  Echocardiogram Echocardiogram Transesophageal has been performed.  Bobbye Charleston 11/03/2016, 10:04 AM

## 2016-11-03 NOTE — Anesthesia Procedure Notes (Signed)
Procedure Name: Intubation Date/Time: 11/03/2016 8:10 AM Performed by: Clearnce Sorrel Pre-anesthesia Checklist: Patient identified, Emergency Drugs available, Suction available, Patient being monitored and Timeout performed Patient Re-evaluated:Patient Re-evaluated prior to inductionOxygen Delivery Method: Circle system utilized Preoxygenation: Pre-oxygenation with 100% oxygen Intubation Type: IV induction Ventilation: Mask ventilation without difficulty Laryngoscope Size: Miller, 3, 4 and Mac Grade View: Grade III Tube type: Oral Tube size: 8.0 mm Number of attempts: 3 Airway Equipment and Method: Bougie stylet Placement Confirmation: ETT inserted through vocal cords under direct vision,  positive ETCO2,  CO2 detector and breath sounds checked- equal and bilateral Secured at: 20 cm Tube secured with: Tape Dental Injury: Injury to lip and Teeth and Oropharynx as per pre-operative assessment  Difficulty Due To: Difficult Airway- due to anterior larynx and Difficulty was unanticipated

## 2016-11-03 NOTE — Op Note (Signed)
CARDIOTHORACIC SURGERY OPERATIVE NOTE  Date of Procedure:  11/03/2016  Preoperative Diagnosis:   Bicuspid Aortic Valve  Severe Aortic Insufficiency  Aortic Root Aneurysm  Severe Mitral Regurgitation  Postoperative Diagnosis: Same  Procedure:    Bentall Aortic Root Replacement  On-X bileaflet mechanical valve synthetic root conduit (size 27/29 mm, ref #[id number], serial [id number])  Reimplantation of left main and right coronary arteries   Mitral Valve Repair  Complex valvuloplasty including artificial Gore-tex neochord placement x6  Sorin Memo 3D ring annuloplasty (size 28 mm, catalog [id number], serial [id number])  Surgeon: Valentina Gu. Roxy Manns, MD  Assistant: Sharalyn Ink. 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 The patient states that he has known he had a heart murmur all of his life. He moved to [facility] approximately threeyears ago, and prior to that he had never been formally evaluated by a cardiologist. He was noted to have a prominent murmur on physical exam and referred for consultation last fall when he was first evaluated by Dr. [name]. The  patient reported to be completely asymptomatic at that time, and echocardiogram revealed what was felt to be moderate aortic stenosis and moderate mitral regurgitation. Cardiac gated CT angiogram of the heart was performed to evaluate the aortic root aneurysm which measured less than 5 cm in its greatest diameter. The patient was seen in follow-up recently by Dr. [name] and repeat echocardiogram revealed a significant drop in left ventricular ejection fraction with increased mitral regurgitation. The patient subsequently underwent transesophageal echocardiogram 09/17/2016. This confirmed the presence of a functionally bicuspid aortic valve with moderate to severe aortic insufficiency. There was mitral valve prolapse with severe mitral regurgitation caused by multiple ruptured chordae tendineae from the middle scallop of the anterior leaflet. Left ventricular function was reduced with ejection fraction estimated 40-45%. Left and right heart catheterization was performed 09/29/2016. This revealed no significant coronary artery disease and mild pulmonary hypertension. There were large V waves consistent with severe mitral regurgitation and severe aortic insufficiency. The patient was referred for surgical consultation.  The patient has been seen in consultation and counseled at length regarding the indications, risks and potential benefits of surgery.  All questions have been answered, and the patient provides full informed consent for the operation as described.    DETAILS OF THE OPERATIVE PROCEDURE  Preparation:  The patient is brought to the operating room on the above mentioned date and central monitoring was established by the anesthesia team including placement of Swan-Ganz catheter and radial arterial line. The patient is placed in the supine position on the operating table.  Intravenous antibiotics are administered. General endotracheal anesthesia is induced uneventfully. A Foley catheter is  placed.  Baseline  transesophageal echocardiogram was performed.  Findings were notable for a functionally bicuspid aortic valve with complete fusion of the left and right leaflets. There was mild aortic stenosis and severe aortic insufficiency. There was severe mitral regurgitation. There were multiple ruptured chordae tendineae from the middle scallop of the anterior leaflet (A2).  There was annular dilatation. Left ventricle was dilated. There was mild to moderate left ventricular systolic dysfunction with ejection fraction estimated 45%. Right ventricular size and function appeared normal. There was trivial tricuspid regurgitation.  The patient's chest, abdomen, both groins, and both lower extremities are prepared and draped in a sterile manner. A time out procedure is performed.   Surgical Approach:  A median sternotomy incision was performed and the pericardium is opened. The ascending aorta is dilated in appearance.  Dissection is continued in a cephalad direction to expose the aortic arch and the innominate artery.   Extracorporeal Cardiopulmonary Bypass and Myocardial Protection:  The right common femoral vein is cannulated using the Seldinger technique and a guidewire advanced into the right atrium using TEE guidance.  The patient is heparinized systemically and the right common femoral vein cannulated using a 22 Fr long femoral venous cannula.  The innominate artery is cannulated for cardiopulmonary bypass.  Adequate heparinization is verified.   A retrograde cardioplegia cannula is placed through the right atrium into the coronary sinus.   The entire pre-bypass portion of the operation was notable for stable hemodynamics.  Cardiopulmonary bypass was begun and the surface of the heart is inspected.  A second venous cannula is placed directly into the superior vena cava.   A cardioplegia cannula is placed in the ascending aorta.  A temperature probe was placed in the interventricular  septum.  The operative field is continuously flooded with carbon dioxide.  The patient is cooled to 28C systemic temperature.  The aortic cross clamp is applied and cold blood cardioplegia is delivered initially in an antegrade fashion through the aortic root.   Supplemental cardioplegia is given retrograde through the coronary sinus catheter.  Iced saline slush is applied for topical hypothermia.  The initial cardioplegic arrest is rapid with early diastolic arrest.  Repeat doses of cardioplegia are administered intermittently throughout the entire cross clamp portion of the operation through the coronary sinus catheter in order to maintain completely flat electrocardiogram and septal myocardial temperature below 15C.  Myocardial protection was felt to be excellent.   Mitral Valve Repair:  A left atriotomy incision was performed through the interatrial groove and extended partially across the back wall of the left atrium after opening the oblique sinus inferiorly.  The mitral valve is exposed using a self-retaining retractor.  The mitral valve was inspected and notable for fibroelastic deficiency type degenerative disease with rupture of all of the primary chordae tendineae from the posterior papillary muscle to the A2 segment of the anterior leaflet.  All of the primary chordae tendineae from the anterior papillary muscle to the A2 segment were intact. All of the subvalvular apparatus to the posterior leaflet appeared normal. There was annular dilatation.  Artificial neochord placement was performed using Chord-X multi-strand CV-4 Goretex pre-measured loops.  The appropriate cord length was measured from corresponding normal length primary cords from the A2 segment of the anterior leaflet to the anterior papillary muscle. The papillary muscle suture of the Chord-X multi-strand suture was placed through the head of the posterior papillary muscle in a horizontal mattress fashion and tied over Teflon felt  pledgets. Each of the three pre-measured loops  were then reimplanted into the free margin of the A2 segment of the anterior leaflet.    Interrupted 2-0 Ethibond horizontal mattress sutures were placed circumferentially around the entire mitral annulus.  The valve was tested with saline and appeared competent even without ring annuloplasty complete. The valve was sized to a 28 mm annuloplasty ring, based upon the transverse distance between the left and right commissures and the height of the anterior leaflet, corresponding to a size just slightly larger than the overall surface area of the anterior leaflet.  A Sorin Memo 3D annuloplasty ring (size 3mm, catalog [id number], serial [id number]) was secured in place uneventfully. The valve was tested with saline and appeared perfectly competent with a broad symmetrical line of coaptation of the anterior and posterior leaflet. There is no residual leak.   The atriotomy was closed using a 2-layer closure of running 3-0 Prolene suture after placing a sump drain across the mitral valve to serve as a left ventricular vent.     Bentall Aortic Root Replacement:  The ascending aorta is transected approximately 1 cm below the level of the aortic cross clamp.  The entire ascending aorta proximal to this level was resected to the level corresponding to the sinotubular junction. Aortic valve is inspected. The aortic valve is congenitally bicuspid with complete fusion of the left and right leaflets. There was very mild aortic stenosis. The non-coronary leaflet was completely incompetent and prolapsing into the left ventricle. The aortic valve leaflets are excised sharply. The aortic annulus is decalcified. This is technically straightforward as there was minimal annular calcification.  The left main and the right coronary arteries are each mobilized off of the aortic root on individual buttons of aortic tissue. The remainder of the 3 sinuses of Valsalva are excised sharply.  Aortic root is sized to accept a 27 mm bileaflet mechanical prosthetic valve conduit.  Aortic root replacement is performed using interrupted 2-0 Ethibond horizontal mattress pledgeted sutures for the proximal suture line with the pledgets in the supra-annular position.  An On-X bileaflet mechanical prosthetic valve with synthetic root conduit (size 27/29 mm, ref #[id number], serial [id number]) was secured in place uneventfully.    The left main and the right coronary arteries were each reimplanted onto the adjacent sinus of Valsalva portion of the aortic root graft after creating circular defects in the graft using thermal cautery.  Each of the coronaries were reimplanted using running 5-0 Prolene suture.  The distal end of the aortic root graft is trimmed and beveled to an appropriate length and sewn to the distal end of the ascending aorta in an end to end fashion using running 4-0 Prolene suture.  A small hole was made in the anterior surface of the synthetic root graft to serve as a vent.   Procedure Completion:  One final dose of warm retrograde "hot shot" cardioplegia was administered retrograde through the coronary sinus catheter while all air was evacuated through the aortic root graft.  The aortic cross clamp was removed after a total cross clamp time of 174 minutes.  Epicardial pacing wires are fixed to the right ventricular outflow tract and to the right atrial appendage. The patient is rewarmed to 37C temperature. The aortic and left ventricular vents are removed.  The patient is weaned and disconnected from cardiopulmonary bypass.  The patient's rhythm at separation from bypass was AV paced.  The patient was weaned from cardiopulmonary bypass on low dose milrinone infusion. Total cardiopulmonary bypass time for the operation was 209 minutes.  Followup transesophageal echocardiogram performed after separation from bypass revealed a well-seated mitral annuloplasty ring and a mitral valve that  was functioning normally and without any residual mitral regurgitation.  There was a bileaflet mechanical prosthetic valve in aortic position that was functioning normally.  Left ventricular function was unchanged from preoperatively.  The innominate artery and superior vena cava cannula were removed uneventfully. Protamine was administered to reverse the anticoagulation. The femoral venous cannula was removed and manual pressure held on the groin for 30 minutes.  The mediastinum and pleural space were inspected for hemostasis and irrigated with saline solution. The mediastinum and both pleural spaces were drained using 4 chest tubes placed through separate stab incisions inferiorly.  The soft tissues anterior to the aorta were reapproximated loosely. The sternum is closed with double strength sternal wire. The soft tissues anterior to the sternum were closed in multiple layers and the skin is closed with a running subcuticular skin closure.   The post-bypass portion of the operation was notable for stable rhythm and hemodynamics.   No blood products were administered during the operation.   Disposition:  The patient tolerated the procedure well.  The patient was transported to the surgical intensive care unit in stable condition. There were no intraoperative complications. All sponge instrument and needle counts are verified correct at completion of the operation.    Valentina Gu. Roxy Manns MD 11/03/2016 2:30 PM

## 2016-11-03 NOTE — Progress Notes (Signed)
CT surgery PM rounds  Hemodynamics stable after Bentall- MV repair- atrial paced intubated but responsive Not bleeding Doing well

## 2016-11-03 NOTE — Anesthesia Procedure Notes (Addendum)
Central Venous Catheter Insertion Performed by: anesthesiologist Patient location: Pre-op. Preanesthetic checklist: patient identified, IV checked, site marked, risks and benefits discussed, surgical consent, monitors and equipment checked, pre-op evaluation, timeout performed and anesthesia consent Position: supine Lidocaine 1% used for infiltration Landmarks identified Catheter size: 9 Fr Central line was placed.MAC introducer Swan type and PA catheter depth:thermodilationProcedure performed using ultrasound guided technique. Attempts: 1 Following insertion, line sutured and dressing applied. Post procedure assessment: blood return through all ports, free fluid flow and no air. Patient tolerated the procedure well with no immediate complications.

## 2016-11-03 NOTE — Anesthesia Procedure Notes (Signed)
Central Venous Catheter Insertion Performed by: anesthesiologist Patient location: Pre-op. Preanesthetic checklist: patient identified, IV checked, site marked, risks and benefits discussed, surgical consent, monitors and equipment checked, pre-op evaluation, timeout performed and anesthesia consent PA cath was placed.Swan type and PA catheter depth:thermodilationProcedure performed without using ultrasound guided technique. Attempts: 1 Patient tolerated the procedure well with no immediate complications.

## 2016-11-03 NOTE — Brief Op Note (Addendum)
11/03/2016  12:54 PM  PATIENT:  Kenneth Ryan  70 y.o. male  PRE-OPERATIVE DIAGNOSIS:  1. AORTIC ROOT ANEURYSM 2. SEVERE AI secondary to BICUSPID AORTIC VALVE 3. SEVERE MR  POST-OPERATIVE DIAGNOSIS: 1. AORTIC ROOT ANEURYSM 2. SEVERE AI secondary to BICUSPID AORTIC VALVE 3. 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

## 2016-11-03 NOTE — Progress Notes (Signed)
RT NOTE:  Rapid wean terminated. Pt complaining of back pain. RR >40, VT <200. Will attempt @ later time tonight

## 2016-11-03 NOTE — Anesthesia Preprocedure Evaluation (Signed)
Anesthesia Evaluation  Patient identified by MRN, date of birth, ID band Patient awake    Reviewed: Allergy & Precautions, NPO status , Patient's Chart, lab work & pertinent test results, reviewed documented beta blocker date and time   History of Anesthesia Complications (+) PONV and history of anesthetic complications  Airway Mallampati: II  TM Distance: >3 FB Neck ROM: Full    Dental  (+) Teeth Intact   Pulmonary neg pulmonary ROS, former smoker,    breath sounds clear to auscultation       Cardiovascular + Peripheral Vascular Disease  + Valvular Problems/Murmurs MR  Rhythm:Regular  Left ventricle: The cavity size was normal. Wall thickness was   normal. Systolic function was mildly to moderately reduced. The   estimated ejection fraction was in the range of 40% to 45%.   Diffuse hypokinesis. - Aortic valve: Functionally bicuspid; moderately thickened,   moderately calcified leaflets; fusion of the right-left coronary   commissure. There was moderate to severe regurgitation. - Aorta: The aorta was moderately dilated. Aortic root dimension:   48 mm (ED, M-mode). - Mitral valve: There was ruptured chordae resulting in partial   flail anterior leaflet. There was severe regurgitation directed   eccentrically and posteriorly.   Neuro/Psych  Headaches,    GI/Hepatic Neg liver ROS, GERD  Medicated and Controlled,  Endo/Other  negative endocrine ROS  Renal/GU negative Renal ROS     Musculoskeletal   Abdominal   Peds  Hematology negative hematology ROS (+)   Anesthesia Other Findings   Reproductive/Obstetrics                             Anesthesia Physical Anesthesia Plan  ASA: IV  Anesthesia Plan: General   Post-op Pain Management:    Induction: Intravenous  Airway Management Planned: Oral ETT  Additional Equipment: Arterial line, CVP, PA Cath, Ultrasound Guidance Line Placement  and TEE  Intra-op Plan:   Post-operative Plan: Post-operative intubation/ventilation  Informed Consent: I have reviewed the patients History and Physical, chart, labs and discussed the procedure including the risks, benefits and alternatives for the proposed anesthesia with the patient or authorized representative who has indicated his/her understanding and acceptance.   Dental advisory given  Plan Discussed with: CRNA and Surgeon  Anesthesia Plan Comments:         Anesthesia Quick Evaluation

## 2016-11-03 NOTE — Transfer of Care (Signed)
Immediate Anesthesia Transfer of Care Note  Patient: Kenneth Ryan  Procedure(s) Performed: Procedure(s): BENTALL PROCEDURE AORTIC ROOT REPLACEMENT (N/A) MITRAL VALVE REPAIR (MVR) (N/A) TRANSESOPHAGEAL ECHOCARDIOGRAM (TEE) (N/A)  Patient Location: SICU  Anesthesia Type:General  Level of Consciousness: Patient remains intubated per anesthesia plan  Airway & Oxygen Therapy: Patient remains intubated per anesthesia plan  Post-op Assessment: Report given to RN and Post -op Vital signs reviewed and stable  Post vital signs: Reviewed and stable  Last Vitals:  Vitals:   11/03/16 0621  BP: (!) 148/62  Pulse: 67  Resp: 20  Temp: 36.7 C    Last Pain:  Vitals:   11/03/16 0621  TempSrc: Oral         Complications: No apparent anesthesia complications

## 2016-11-04 ENCOUNTER — Inpatient Hospital Stay (HOSPITAL_COMMUNITY): Payer: PPO

## 2016-11-04 ENCOUNTER — Encounter (HOSPITAL_COMMUNITY): Payer: Self-pay | Admitting: Thoracic Surgery (Cardiothoracic Vascular Surgery)

## 2016-11-04 DIAGNOSIS — J9811 Atelectasis: Secondary | ICD-10-CM | POA: Diagnosis not present

## 2016-11-04 DIAGNOSIS — I511 Rupture of chordae tendineae, not elsewhere classified: Secondary | ICD-10-CM | POA: Diagnosis not present

## 2016-11-04 DIAGNOSIS — Z85828 Personal history of other malignant neoplasm of skin: Secondary | ICD-10-CM | POA: Diagnosis not present

## 2016-11-04 DIAGNOSIS — Z8521 Personal history of malignant neoplasm of larynx: Secondary | ICD-10-CM | POA: Diagnosis not present

## 2016-11-04 DIAGNOSIS — Z4682 Encounter for fitting and adjustment of non-vascular catheter: Secondary | ICD-10-CM | POA: Diagnosis not present

## 2016-11-04 DIAGNOSIS — I340 Nonrheumatic mitral (valve) insufficiency: Secondary | ICD-10-CM | POA: Diagnosis not present

## 2016-11-04 DIAGNOSIS — I272 Pulmonary hypertension, unspecified: Secondary | ICD-10-CM | POA: Diagnosis not present

## 2016-11-04 DIAGNOSIS — D62 Acute posthemorrhagic anemia: Secondary | ICD-10-CM | POA: Diagnosis not present

## 2016-11-04 DIAGNOSIS — I5042 Chronic combined systolic (congestive) and diastolic (congestive) heart failure: Secondary | ICD-10-CM | POA: Diagnosis not present

## 2016-11-04 DIAGNOSIS — I712 Thoracic aortic aneurysm, without rupture: Secondary | ICD-10-CM | POA: Diagnosis not present

## 2016-11-04 DIAGNOSIS — I341 Nonrheumatic mitral (valve) prolapse: Secondary | ICD-10-CM | POA: Diagnosis not present

## 2016-11-04 DIAGNOSIS — E781 Pure hyperglyceridemia: Secondary | ICD-10-CM | POA: Diagnosis not present

## 2016-11-04 DIAGNOSIS — Q231 Congenital insufficiency of aortic valve: Secondary | ICD-10-CM | POA: Diagnosis not present

## 2016-11-04 LAB — POCT I-STAT 3, ART BLOOD GAS (G3+)
ACID-BASE DEFICIT: 4 mmol/L — AB (ref 0.0–2.0)
Acid-base deficit: 3 mmol/L — ABNORMAL HIGH (ref 0.0–2.0)
Acid-base deficit: 3 mmol/L — ABNORMAL HIGH (ref 0.0–2.0)
BICARBONATE: 21.9 mmol/L (ref 20.0–28.0)
BICARBONATE: 21.9 mmol/L (ref 20.0–28.0)
BICARBONATE: 23 mmol/L (ref 20.0–28.0)
O2 Saturation: 98 %
O2 Saturation: 89 %
O2 Saturation: 94 %
PCO2 ART: 36.3 mmHg (ref 32.0–48.0)
PCO2 ART: 40 mmHg (ref 32.0–48.0)
PH ART: 7.388 (ref 7.350–7.450)
PH ART: 7.349 — AB (ref 7.350–7.450)
PO2 ART: 60 mmHg — AB (ref 83.0–108.0)
PO2 ART: 72 mmHg — AB (ref 83.0–108.0)
Patient temperature: 37
Patient temperature: 98
TCO2: 23 mmol/L (ref 0–100)
TCO2: 23 mmol/L (ref 0–100)
TCO2: 24 mmol/L (ref 0–100)
pCO2 arterial: 41.7 mmHg (ref 32.0–48.0)
pH, Arterial: 7.346 — ABNORMAL LOW (ref 7.350–7.450)
pO2, Arterial: 111 mmHg — ABNORMAL HIGH (ref 83.0–108.0)

## 2016-11-04 LAB — CBC
HEMATOCRIT: 32.5 % — AB (ref 39.0–52.0)
HEMATOCRIT: 32.6 % — AB (ref 39.0–52.0)
HEMOGLOBIN: 11 g/dL — AB (ref 13.0–17.0)
HEMOGLOBIN: 11 g/dL — AB (ref 13.0–17.0)
MCH: 28.1 pg (ref 26.0–34.0)
MCH: 28.4 pg (ref 26.0–34.0)
MCHC: 33.8 g/dL (ref 30.0–36.0)
MCHC: 33.7 g/dL (ref 30.0–36.0)
MCV: 83.1 fL (ref 78.0–100.0)
MCV: 84.2 fL (ref 78.0–100.0)
Platelets: 128 10*3/uL — ABNORMAL LOW (ref 150–400)
Platelets: 102 10*3/uL — ABNORMAL LOW (ref 150–400)
RBC: 3.91 MIL/uL — AB (ref 4.22–5.81)
RBC: 3.87 MIL/uL — ABNORMAL LOW (ref 4.22–5.81)
RDW: 13.7 % (ref 11.5–15.5)
RDW: 14.2 % (ref 11.5–15.5)
WBC: 13.5 10*3/uL — AB (ref 4.0–10.5)
WBC: 14.6 10*3/uL — AB (ref 4.0–10.5)

## 2016-11-04 LAB — GLUCOSE, CAPILLARY
GLUCOSE-CAPILLARY: 141 mg/dL — AB (ref 65–99)
GLUCOSE-CAPILLARY: 179 mg/dL — AB (ref 65–99)
GLUCOSE-CAPILLARY: 149 mg/dL — AB (ref 65–99)
GLUCOSE-CAPILLARY: 148 mg/dL — AB (ref 65–99)
GLUCOSE-CAPILLARY: 139 mg/dL — AB (ref 65–99)
GLUCOSE-CAPILLARY: 163 mg/dL — AB (ref 65–99)
GLUCOSE-CAPILLARY: 174 mg/dL — AB (ref 65–99)
Glucose-Capillary: 123 mg/dL — ABNORMAL HIGH (ref 65–99)
Glucose-Capillary: 160 mg/dL — ABNORMAL HIGH (ref 65–99)
Glucose-Capillary: 162 mg/dL — ABNORMAL HIGH (ref 65–99)
Glucose-Capillary: 166 mg/dL — ABNORMAL HIGH (ref 65–99)
Glucose-Capillary: 181 mg/dL — ABNORMAL HIGH (ref 65–99)

## 2016-11-04 LAB — BASIC METABOLIC PANEL
ANION GAP: 6 (ref 5–15)
ANION GAP: 4 — AB (ref 5–15)
BUN: 9 mg/dL (ref 6–20)
BUN: 8 mg/dL (ref 6–20)
CALCIUM: 8.2 mg/dL — AB (ref 8.9–10.3)
CO2: 22 mmol/L (ref 22–32)
CO2: 27 mmol/L (ref 22–32)
Calcium: 7.8 mg/dL — ABNORMAL LOW (ref 8.9–10.3)
Chloride: 111 mmol/L (ref 101–111)
Chloride: 107 mmol/L (ref 101–111)
Creatinine, Ser: 0.93 mg/dL (ref 0.61–1.24)
Creatinine, Ser: 0.93 mg/dL (ref 0.61–1.24)
GFR calc non Af Amer: >60 mL/min (ref >60)
GLUCOSE: 178 mg/dL — AB (ref 65–99)
GLUCOSE: 156 mg/dL — AB (ref 65–99)
POTASSIUM: 3.9 mmol/L (ref 3.5–5.1)
Potassium: 3.6 mmol/L (ref 3.5–5.1)
Sodium: 139 mmol/L (ref 135–145)
Sodium: 138 mmol/L (ref 135–145)

## 2016-11-04 LAB — ECHO TEE
AOPROX: 4.8 cm
Annulus: 3.1 cm
SINUS: 4.3 cm

## 2016-11-04 LAB — MAGNESIUM
MAGNESIUM: 2.2 mg/dL (ref 1.7–2.4)
Magnesium: 2.4 mg/dL (ref 1.7–2.4)

## 2016-11-04 LAB — BLOOD GAS, ARTERIAL

## 2016-11-04 MED ORDER — INSULIN DETEMIR 100 UNIT/ML ~~LOC~~ SOLN
20.0000 [IU] | Freq: Once | SUBCUTANEOUS | Status: AC
  Administered 2016-11-04: 20 [IU] via SUBCUTANEOUS
  Filled 2016-11-04: qty 0.2

## 2016-11-04 MED ORDER — FUROSEMIDE 10 MG/ML IJ SOLN
20.0000 mg | Freq: Four times a day (QID) | INTRAMUSCULAR | Status: AC
Start: 2016-11-04 — End: 2016-11-04
  Administered 2016-11-04 (×3): 20 mg via INTRAVENOUS
  Filled 2016-11-04 (×3): qty 2

## 2016-11-04 MED ORDER — WARFARIN - PHYSICIAN DOSING INPATIENT: Freq: Every day | Status: DC

## 2016-11-04 MED ORDER — POTASSIUM CHLORIDE 10 MEQ/50ML IV SOLN
10.0000 meq | INTRAVENOUS | Status: AC
  Administered 2016-11-04 (×3): 10 meq via INTRAVENOUS
  Filled 2016-11-04: qty 50

## 2016-11-04 MED ORDER — KETOROLAC TROMETHAMINE 15 MG/ML IJ SOLN
15.0000 mg | Freq: Four times a day (QID) | INTRAMUSCULAR | Status: AC
Start: 2016-11-04 — End: 2016-11-05
  Administered 2016-11-04 – 2016-11-05 (×5): 15 mg via INTRAVENOUS
  Filled 2016-11-04 (×5): qty 1

## 2016-11-04 MED ORDER — ORAL CARE MOUTH RINSE
15.0000 mL | Freq: Two times a day (BID) | OROMUCOSAL | Status: DC
  Administered 2016-11-04 – 2016-11-08 (×8): 15 mL via OROMUCOSAL

## 2016-11-04 MED ORDER — WARFARIN SODIUM 2.5 MG PO TABS
2.5000 mg | ORAL_TABLET | Freq: Every day | ORAL | Status: DC
  Administered 2016-11-04 – 2016-11-05 (×2): 2.5 mg via ORAL
  Filled 2016-11-04 (×2): qty 1

## 2016-11-04 MED ORDER — INSULIN ASPART 100 UNIT/ML ~~LOC~~ SOLN
0.0000 [IU] | SUBCUTANEOUS | Status: DC
  Administered 2016-11-04: 4 [IU] via SUBCUTANEOUS
  Administered 2016-11-04: 2 [IU] via SUBCUTANEOUS
  Administered 2016-11-04: 4 [IU] via SUBCUTANEOUS
  Administered 2016-11-05 (×2): 2 [IU] via SUBCUTANEOUS

## 2016-11-04 MED FILL — Sodium Bicarbonate IV Soln 8.4%: INTRAVENOUS | Qty: 100 | Status: AC

## 2016-11-04 MED FILL — Lidocaine HCl IV Inj 20 MG/ML: INTRAVENOUS | Qty: 5 | Status: AC

## 2016-11-04 MED FILL — Mannitol IV Soln 20%: INTRAVENOUS | Qty: 500 | Status: AC

## 2016-11-04 MED FILL — Sodium Chloride IV Soln 0.9%: INTRAVENOUS | Qty: 2000 | Status: AC

## 2016-11-04 MED FILL — Heparin Sodium (Porcine) Inj 1000 Unit/ML: INTRAMUSCULAR | Qty: 90 | Status: AC

## 2016-11-04 MED FILL — Electrolyte-R (PH 7.4) Solution: INTRAVENOUS | Qty: 5000 | Status: AC

## 2016-11-04 NOTE — Progress Notes (Signed)
RT NOTE:  CPAP/PS initiated per Cardiac Rapid Wean. Pt awake, follows commands.

## 2016-11-04 NOTE — Procedures (Signed)
Extubation Procedure Note Rapid Wean completed w/o complication. Follows all commands. ABG within acceptable range. Cuff Leak present, NIF -32, VC 798mls.  Pt extubated to 6L [place] (PaO2 60), will titrate. Pt able to speak name & location. No stridor. IS 759mls.   Patient Details:   Name: Kenneth Ryan DOB: [birth year] MRN: [id number]   Airway Documentation:   Pt extubated following Rapid Wean.   Evaluation  O2 sats: stable throughout Complications: No apparent complications Patient did tolerate procedure well. Bilateral Breath Sounds: Rhonchi   Yes  Sharen Hint 11/04/2016, 4:22 AM

## 2016-11-04 NOTE — Anesthesia Postprocedure Evaluation (Signed)
Anesthesia Post Note  Patient: Tree surgeon  Procedure(s) Performed: Procedure(s) (LRB): BENTALL PROCEDURE AORTIC ROOT REPLACEMENT (N/A) MITRAL VALVE REPAIR (MVR) (N/A) TRANSESOPHAGEAL ECHOCARDIOGRAM (TEE) (N/A)  Patient location during evaluation: ICU Anesthesia Type: General Level of consciousness: sedated Pain management: pain level controlled Vital Signs Assessment: post-procedure vital signs reviewed and stable Respiratory status: patient remains intubated per anesthesia plan Cardiovascular status: stable Postop Assessment: no signs of nausea or vomiting Anesthetic complications: no    Last Vitals:  Vitals:   11/04/16 1000 11/04/16 1015  BP: 127/70 109/68  Pulse: 89 89  Resp: 19 10  Temp:      Last Pain:  Vitals:   11/04/16 0952  TempSrc:   PainSc: 0-No pain                 Kenneth Ryan

## 2016-11-04 NOTE — Progress Notes (Addendum)
11/04/2016 5:44 PM Nursing note Unable to obtain IStat Chem 8. BMET ordered as replacement. Lab notified. Dr. Roxy Manns made aware. No new orders at this time.  Raja Caputi, Arville Lime

## 2016-11-04 NOTE — Progress Notes (Addendum)
      [address]       [place] [address]             [phone]        CARDIOTHORACIC SURGERY PROGRESS NOTE   R1 Day Post-Op Procedure(s) (LRB): BENTALL PROCEDURE AORTIC ROOT REPLACEMENT (N/A) MITRAL VALVE REPAIR (MVR) (N/A) TRANSESOPHAGEAL ECHOCARDIOGRAM (TEE) (N/A)  Subjective: Looks good and feels well.  Mild soreness in chest.  No SOB.  No nausea.  Objective: Vital signs: BP Readings from Last 1 Encounters:  11/04/16 113/73   Pulse Readings from Last 1 Encounters:  11/04/16 89   Resp Readings from Last 1 Encounters:  11/04/16 (!) 25   Temp Readings from Last 1 Encounters:  11/04/16 98.2 F (36.8 C)    Hemodynamics: PAP: (23-39)/(14-25) 39/23 CO:  [3.4 L/min-5.8 L/min] 5.8 L/min CI:  [1.6 L/min/m2-2.5 L/min/m2] 2.5 L/min/m2  Physical Exam:  Rhythm:   Junctional - AAI pacing  Breath sounds: clear  Heart sounds:  RRR  Incisions:  Dressing dry, intact  Abdomen:  Soft, non-distended, non-tender  Extremities:  Warm, well-perfused  Chest tubes:  Low volume thin serosanguinous output, no air leak    Intake/Output from previous day: 11/14 0701 - 11/15 0700 In: 7658.1 [I.V.:5793.1; Blood:415; NG/GT:150; IV Piggyback:1300] Out: [id number] [Urine:4235; Emesis/NG output:70; Blood:830; Chest Tube:680] Intake/Output this shift: No intake/output data recorded.  Lab Results:  CBC: Recent Labs  11/03/16 2100 11/03/16 2209 11/04/16 0400  WBC 12.4*  --  13.5*  HGB 11.0* 9.9* 11.0*  HCT 32.4* 29.0* 32.5*  PLT 118*  --  128*    BMET:  Recent Labs  11/03/16 2209 11/04/16 0400  NA 143 139  K 4.2 3.9  CL 108 111  CO2  --  22  GLUCOSE 135* 178*  BUN 11 9  CREATININE 0.90 0.93  CALCIUM  --  7.8*     PT/INR:   Recent Labs  11/03/16 1521  LABPROT 20.5*  INR 1.73    CBG (last 3)   Recent Labs  11/04/16 0207 11/04/16 0404 11/04/16 0516  GLUCAP 141* 179* 181*    ABG    Component Value Date/Time   PHART 7.349 (L) 11/04/2016  0518   PCO2ART 41.7 11/04/2016 0518   PO2ART 72.0 (L) 11/04/2016 0518   HCO3 23.0 11/04/2016 0518   TCO2 24 11/04/2016 0518   ACIDBASEDEF 3.0 (H) 11/04/2016 0518   O2SAT 94.0 11/04/2016 0518    CXR: Mild bibasilar atelectasis  Assessment/Plan: S/P Procedure(s) (LRB): BENTALL PROCEDURE AORTIC ROOT REPLACEMENT (N/A) MITRAL VALVE REPAIR (MVR) (N/A) TRANSESOPHAGEAL ECHOCARDIOGRAM (TEE) (N/A)  Doing well POD1 Maintaining AAI paced rhythm w/ stable hemodynamics on very low dose milrinone Breathing comfortably w/ O2 sats 98-99% on 6 L/min via [place] Expected post op acute blood loss anemia, 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

## 2016-11-04 NOTE — Progress Notes (Addendum)
RT NOTE:  Rapid Wean initiated. Pt awake and following commands.

## 2016-11-04 NOTE — Progress Notes (Signed)
      [address]       [place] [address]             [phone]      POD # 1 Bentall  Comfortable  BP 121/67   Pulse 89   Temp 98.3 F (36.8 C) (Oral)   Resp 20   Ht 6' (1.829 m)   Wt 218 lb 11.2 oz (99.2 kg)   SpO2 98%   BMI 29.66 kg/m    Intake/Output Summary (Last 24 hours) at 11/04/16 2333 Last data filed at 11/04/16 2227  Gross per 24 hour  Intake          1540.31 ml  Output             3190 ml  Net         -1649.69 ml   CBG mildly elevated  Remo Lipps C. Roxan Hockey, MD [facility] [phone]

## 2016-11-05 ENCOUNTER — Inpatient Hospital Stay (HOSPITAL_COMMUNITY): Payer: PPO

## 2016-11-05 DIAGNOSIS — Z462 Encounter for fitting and adjustment of other devices related to nervous system and special senses: Secondary | ICD-10-CM | POA: Diagnosis not present

## 2016-11-05 LAB — GLUCOSE, CAPILLARY
GLUCOSE-CAPILLARY: 124 mg/dL — AB (ref 65–99)
Glucose-Capillary: 118 mg/dL — ABNORMAL HIGH (ref 65–99)
Glucose-Capillary: 124 mg/dL — ABNORMAL HIGH (ref 65–99)
Glucose-Capillary: 149 mg/dL — ABNORMAL HIGH (ref 65–99)
Glucose-Capillary: 108 mg/dL — ABNORMAL HIGH (ref 65–99)

## 2016-11-05 LAB — CBC
HCT: 31.5 % — ABNORMAL LOW (ref 39.0–52.0)
HEMOGLOBIN: 10.4 g/dL — AB (ref 13.0–17.0)
MCH: 28.1 pg (ref 26.0–34.0)
MCHC: 33 g/dL (ref 30.0–36.0)
MCV: 85.1 fL (ref 78.0–100.0)
Platelets: 101 10*3/uL — ABNORMAL LOW (ref 150–400)
RBC: 3.7 MIL/uL — AB (ref 4.22–5.81)
RDW: 14.2 % (ref 11.5–15.5)
WBC: 12.2 10*3/uL — ABNORMAL HIGH (ref 4.0–10.5)

## 2016-11-05 LAB — TYPE AND SCREEN
ABO/RH(D): O POS
Antibody Screen: NEGATIVE
UNIT DIVISION: 0
Unit division: 0

## 2016-11-05 LAB — BASIC METABOLIC PANEL
ANION GAP: 4 — AB (ref 5–15)
BUN: 11 mg/dL (ref 6–20)
CALCIUM: 8.3 mg/dL — AB (ref 8.9–10.3)
CHLORIDE: 107 mmol/L (ref 101–111)
CO2: 28 mmol/L (ref 22–32)
Creatinine, Ser: 0.99 mg/dL (ref 0.61–1.24)
GFR calc non Af Amer: >60 mL/min (ref >60)
GLUCOSE: 133 mg/dL — AB (ref 65–99)
Potassium: 4.2 mmol/L (ref 3.5–5.1)
Sodium: 139 mmol/L (ref 135–145)

## 2016-11-05 LAB — PROTIME-INR
INR: 1.27
Prothrombin Time: 16 seconds — ABNORMAL HIGH (ref 11.4–15.2)

## 2016-11-05 MED ORDER — FENOFIBRATE 160 MG PO TABS
160.0000 mg | ORAL_TABLET | Freq: Every day | ORAL | Status: DC
Start: 2016-11-06 — End: 2016-11-09
  Administered 2016-11-06 – 2016-11-09 (×4): 160 mg via ORAL
  Filled 2016-11-05 (×5): qty 1

## 2016-11-05 MED ORDER — FUROSEMIDE 10 MG/ML IJ SOLN
20.0000 mg | Freq: Two times a day (BID) | INTRAMUSCULAR | Status: AC
  Administered 2016-11-05 (×2): 20 mg via INTRAVENOUS
  Filled 2016-11-05 (×2): qty 2

## 2016-11-05 MED ORDER — MOVING RIGHT ALONG BOOK
Freq: Once | Status: AC
  Administered 2016-11-05: 13:00:00
  Filled 2016-11-05: qty 1

## 2016-11-05 MED ORDER — SODIUM CHLORIDE 0.9% FLUSH
3.0000 mL | Freq: Two times a day (BID) | INTRAVENOUS | Status: DC
  Administered 2016-11-05 – 2016-11-09 (×9): 3 mL via INTRAVENOUS

## 2016-11-05 MED ORDER — INSULIN ASPART 100 UNIT/ML ~~LOC~~ SOLN
0.0000 [IU] | Freq: Three times a day (TID) | SUBCUTANEOUS | Status: DC
Start: 2016-11-05 — End: 2016-11-06
  Administered 2016-11-05 (×2): 2 [IU] via SUBCUTANEOUS

## 2016-11-05 MED ORDER — SODIUM CHLORIDE 0.9 % IV SOLN: 250.0000 mL | INTRAVENOUS | Status: DC | PRN

## 2016-11-05 MED ORDER — POTASSIUM CHLORIDE CRYS ER 20 MEQ PO TBCR
20.0000 meq | EXTENDED_RELEASE_TABLET | Freq: Every day | ORAL | Status: DC
  Administered 2016-11-06 – 2016-11-09 (×4): 20 meq via ORAL
  Filled 2016-11-05 (×4): qty 1

## 2016-11-05 MED ORDER — ASPIRIN EC 81 MG PO TBEC
81.0000 mg | DELAYED_RELEASE_TABLET | Freq: Every day | ORAL | Status: DC
  Administered 2016-11-05 – 2016-11-09 (×5): 81 mg via ORAL
  Filled 2016-11-05 (×5): qty 1

## 2016-11-05 MED ORDER — SODIUM CHLORIDE 0.9% FLUSH: 3.0000 mL | INTRAVENOUS | Status: DC | PRN

## 2016-11-05 MED ORDER — FUROSEMIDE 40 MG PO TABS
40.0000 mg | ORAL_TABLET | Freq: Every day | ORAL | Status: DC
  Administered 2016-11-06 – 2016-11-09 (×4): 40 mg via ORAL
  Filled 2016-11-05 (×4): qty 1

## 2016-11-05 MED FILL — Heparin Sodium (Porcine) Inj 1000 Unit/ML: INTRAMUSCULAR | Qty: 2500 | Status: AC

## 2016-11-05 NOTE — Significant Event (Signed)
Patient ambulated to new room in 2W25, tolerated ambulation well. Ambulated approximately 66feet. VS stable throughout. No personal belongings to take to new room. Patient's family notified of the transfer. Report given to receiving RN Lauren. Staff in room prior to RN leaving. Patient settled in chair per his requests. Armstrong Creasy, Therapist, sports.

## 2016-11-05 NOTE — Progress Notes (Signed)
Pt has arrived to 2w from 2s. Telemetry box applied and CCMD notified. Pt ambulated to chair per pt request. Pt's vitals stable. External pacemaker is attached to pt's epicardial pacing wires. Pacemaker is set to AAI at a rate of 80. Pt denies needs at this time.   Grant Fontana BSN, RN

## 2016-11-05 NOTE — Progress Notes (Signed)
      [address]       [place] [address]             [phone]        CARDIOTHORACIC SURGERY PROGRESS NOTE   R2 Days Post-Op Procedure(s) (LRB): BENTALL PROCEDURE AORTIC ROOT REPLACEMENT (N/A) MITRAL VALVE REPAIR (MVR) (N/A) TRANSESOPHAGEAL ECHOCARDIOGRAM (TEE) (N/A)  Subjective: Looks good.  Mild soreness in chest.  Otherwise no complaints.  Objective: Vital signs: BP Readings from Last 1 Encounters:  11/05/16 132/65   Pulse Readings from Last 1 Encounters:  11/05/16 88   Resp Readings from Last 1 Encounters:  11/05/16 11   Temp Readings from Last 1 Encounters:  11/05/16 97.9 F (36.6 C) (Oral)    Hemodynamics: PAP: (33-43)/(19-25) 40/21  Physical Exam:  Rhythm:   Junctional 60's under pacer - AAI pacing  Breath sounds: clear  Heart sounds:  RRR  Incisions:  Dressing dry, intact  Abdomen:  Soft, non-distended, non-tender  Extremities:  Warm, well-perfused  Chest tubes:  Low volume thin serosanguinous output, no air leak    Intake/Output from previous day: 11/15 0701 - 11/16 0700 In: 886.6 [P.O.:640; I.V.:96.6; IV Piggyback:150] Out: 2730 [Urine:1970; Emesis/NG output:300; Chest Tube:460] Intake/Output this shift: No intake/output data recorded.  Lab Results:  CBC: Recent Labs  11/04/16 1630 11/05/16 0450  WBC 14.6* 12.2*  HGB 11.0* 10.4*  HCT 32.6* 31.5*  PLT 102* 101*    BMET:  Recent Labs  11/04/16 1630 11/05/16 0450  NA 138 139  K 3.6 4.2  CL 107 107  CO2 27 28  GLUCOSE 156* 133*  BUN 8 11  CREATININE 0.93 0.99  CALCIUM 8.2* 8.3*     PT/INR:   Recent Labs  11/05/16 0450  LABPROT 16.0*  INR 1.27    CBG (last 3)   Recent Labs  11/04/16 1917 11/04/16 2346 11/05/16 0342  GLUCAP 174* 123* 124*    ABG    Component Value Date/Time   PHART 7.349 (L) 11/04/2016 0518   PCO2ART 41.7 11/04/2016 0518   PO2ART 72.0 (L) 11/04/2016 0518   HCO3 23.0 11/04/2016 0518   TCO2 24 11/04/2016 0518   ACIDBASEDEF 3.0 (H) 11/04/2016 0518   O2SAT 94.0 11/04/2016 0518    CXR: Looks good.  Mild bibasilar atelectasis  Assessment/Plan: S/P Procedure(s) (LRB): BENTALL PROCEDURE AORTIC ROOT REPLACEMENT (N/A) MITRAL VALVE REPAIR (MVR) (N/A) TRANSESOPHAGEAL ECHOCARDIOGRAM (TEE) (N/A)  Doing well POD2 Maintaining AAI paced rhythm w/ stable BP Breathing comfortably w/ O2 sats 96% on RA Expected post op acute blood loss anemia, mild Chronic combined systolic and diastolic CHF with expected post-op volume excess, weight approx 10 lbs > preop Expected post op atelectasis, mild   D/C chest tubes, central line and Foley  Continue AAI pacing for now  Mobilize  Diuresis  Coumadin  Transfer step down   Rexene Alberts, MD 11/05/2016 7:56 AM

## 2016-11-06 ENCOUNTER — Inpatient Hospital Stay (HOSPITAL_COMMUNITY): Payer: PPO

## 2016-11-06 DIAGNOSIS — J9811 Atelectasis: Secondary | ICD-10-CM | POA: Diagnosis not present

## 2016-11-06 LAB — BASIC METABOLIC PANEL
ANION GAP: 7 (ref 5–15)
BUN: 15 mg/dL (ref 6–20)
CO2: 27 mmol/L (ref 22–32)
Calcium: 8.5 mg/dL — ABNORMAL LOW (ref 8.9–10.3)
Chloride: 103 mmol/L (ref 101–111)
Creatinine, Ser: 0.97 mg/dL (ref 0.61–1.24)
GFR calc non Af Amer: >60 mL/min (ref >60)
Glucose, Bld: 122 mg/dL — ABNORMAL HIGH (ref 65–99)
POTASSIUM: 4 mmol/L (ref 3.5–5.1)
SODIUM: 137 mmol/L (ref 135–145)

## 2016-11-06 LAB — GLUCOSE, CAPILLARY
Glucose-Capillary: 111 mg/dL — ABNORMAL HIGH (ref 65–99)
Glucose-Capillary: 135 mg/dL — ABNORMAL HIGH (ref 65–99)

## 2016-11-06 LAB — CBC
HCT: 29.3 % — ABNORMAL LOW (ref 39.0–52.0)
Hemoglobin: 9.9 g/dL — ABNORMAL LOW (ref 13.0–17.0)
MCH: 28.4 pg (ref 26.0–34.0)
MCHC: 33.8 g/dL (ref 30.0–36.0)
MCV: 84.2 fL (ref 78.0–100.0)
PLATELETS: 105 10*3/uL — AB (ref 150–400)
RBC: 3.48 MIL/uL — AB (ref 4.22–5.81)
RDW: 14.1 % (ref 11.5–15.5)
WBC: 9.9 10*3/uL (ref 4.0–10.5)

## 2016-11-06 LAB — PROTIME-INR
INR: 1.13
Prothrombin Time: 14.5 seconds (ref 11.4–15.2)

## 2016-11-06 MED ORDER — COUMADIN BOOK
Freq: Once | Status: AC
  Administered 2016-11-06: 09:00:00
  Filled 2016-11-06: qty 1

## 2016-11-06 MED ORDER — DM-GUAIFENESIN ER 30-600 MG PO TB12
1.0000 | ORAL_TABLET | Freq: Two times a day (BID) | ORAL | Status: DC
  Administered 2016-11-06 – 2016-11-09 (×7): 1 via ORAL
  Filled 2016-11-06 (×7): qty 1

## 2016-11-06 MED ORDER — WARFARIN SODIUM 5 MG PO TABS
5.0000 mg | ORAL_TABLET | Freq: Every day | ORAL | Status: DC
  Administered 2016-11-06 – 2016-11-08 (×3): 5 mg via ORAL
  Filled 2016-11-06 (×3): qty 1

## 2016-11-06 MED ORDER — WARFARIN VIDEO: Freq: Once | Status: DC

## 2016-11-06 NOTE — Progress Notes (Addendum)
[address]       [place] [address]             [phone]      3 Days Post-Op Procedure(s) (LRB): BENTALL PROCEDURE AORTIC ROOT REPLACEMENT (N/A) MITRAL VALVE REPAIR (MVR) (N/A) TRANSESOPHAGEAL ECHOCARDIOGRAM (TEE) (N/A) Subjective: Feels well, minimal pain, no BM but + flatus Requests mucinex   Objective: Vital signs in last 24 hours: Temp:  [97.6 F (36.4 C)-98.3 F (36.8 C)] 98.1 F (36.7 C) (11/17 0506) Pulse Rate:  [79-81] 80 (11/17 0506) Cardiac Rhythm: Atrial paced (11/17 0745) Resp:  [13-20] 19 (11/17 0506) BP: (108-134)/(56-80) 127/56 (11/17 0506) SpO2:  [93 %-98 %] 93 % (11/17 0506) Weight:  [213 lb 8 oz (96.8 kg)] 213 lb 8 oz (96.8 kg) (11/17 0506)  Hemodynamic parameters for last 24 hours:    Intake/Output from previous day: 11/16 0701 - 11/17 0700 In: 490 [P.O.:490] Out: 880 [Urine:860; Chest Tube:20] Intake/Output this shift: No intake/output data recorded.  General appearance: alert, cooperative and no distress Heart: regular rate and rhythm and crisp valve click Lungs: min dim in bases Abdomen: benign Extremities: no edema Wound: dressing intact  Lab Results:  Recent Labs  11/05/16 0450 11/06/16 0237  WBC 12.2* 9.9  HGB 10.4* 9.9*  HCT 31.5* 29.3*  PLT 101* 105*   BMET:  Recent Labs  11/05/16 0450 11/06/16 0237  NA 139 137  K 4.2 4.0  CL 107 103  CO2 28 27  GLUCOSE 133* 122*  BUN 11 15  CREATININE 0.99 0.97  CALCIUM 8.3* 8.5*    PT/INR:  Recent Labs  11/06/16 0237  LABPROT 14.5  INR 1.13   ABG    Component Value Date/Time   PHART 7.349 (L) 11/04/2016 0518   HCO3 23.0 11/04/2016 0518   TCO2 24 11/04/2016 0518   ACIDBASEDEF 3.0 (H) 11/04/2016 0518   O2SAT 94.0 11/04/2016 0518   CBG (last 3)   Recent Labs  11/05/16 1629 11/05/16 2140 11/06/16 0629  GLUCAP 149* 108* 111*    Meds Scheduled Meds: . acetaminophen  1,000 mg Oral Q6H  . aspirin EC  81 mg Oral Daily  . bisacodyl  10  mg Oral Daily   Or  . bisacodyl  10 mg Rectal Daily  . docusate sodium  200 mg Oral Daily  . fenofibrate  160 mg Oral Daily  . furosemide  40 mg Oral Daily  . insulin aspart  0-24 Units Subcutaneous TID AC & HS  . mouth rinse  15 mL Mouth Rinse BID  . pantoprazole  40 mg Oral Daily  . potassium chloride  20 mEq Oral Daily  . sodium chloride flush  3 mL Intravenous Q12H  . sodium chloride flush  3 mL Intravenous Q12H  . warfarin  2.5 mg Oral q1800  . Warfarin - Physician Dosing Inpatient   Does not apply q1800   Continuous Infusions: . sodium chloride    . milrinone Stopped (11/04/16 1426)   PRN Meds:.sodium chloride, metoprolol, morphine injection, ondansetron (ZOFRAN) IV, oxyCODONE, sodium chloride flush, sodium chloride flush, traMADol  Xrays Dg Chest Port 1 View  Result Date: 11/05/2016 CLINICAL DATA:  Evaluate chest tube placement EXAM: PORTABLE CHEST 1 VIEW COMPARISON:  11/04/2016 FINDINGS: Right IJ catheter is identified with tip at the cavoatrial junction. The lung bases are scratch set there are bilateral chest tubes within the lung bases. No pneumothorax. Atelectasis is identified in both lung bases, unchanged from previous exam. IMPRESSION:  1. Persistent bilateral lower lobe atelectasis. 2. Chest tubes in place without pneumothorax. Electronically Signed   By: Kerby Moors M.D.   On: 11/05/2016 09:08    Assessment/Plan: S/P Procedure(s) (LRB): BENTALL PROCEDURE AORTIC ROOT REPLACEMENT (N/A) MITRAL VALVE REPAIR (MVR) (N/A) TRANSESOPHAGEAL ECHOCARDIOGRAM (TEE) (N/A)  1 doing well 2 increase coumadin dose for mech valve 3 junctional in 70's under pacer- may be able to d/c pacing soon 4 labs stable 5 sugars adeq controlled 6 cont gentle diuresis for improving volume overload 7 routine pulm toilet/rehab   LOS: 3 days    GOLD,WAYNE E 11/06/2016    I have seen and examined the patient and agree with the assessment and plan as outlined.  Doing well.  Maintaining  NSR w/ HR 70's under pacer.  Stop pacing and possibly d/c pacing wires tomorrow if rhythm stable.  D/C CBG's and SSI - no history of DM  Rexene Alberts, MD 11/06/2016 9:58 AM

## 2016-11-06 NOTE — Progress Notes (Signed)
CARDIAC REHAB PHASE I   PRE:  Rate/Rhythm: 67 SR    BP: sitting 116/58    SaO2: 94 RA  MODE:  Ambulation: 350 ft   POST:  Rate/Rhythm: 97 SR    BP: sitting 134/59     SaO2: 97 RA  Pt moving well although did use his arms to get OOB. Steady walking, no c/o. Return to bed.  This was pts third walk today. Will f/u tomorrow. [place], ACSM 11/06/2016 2:05 PM

## 2016-11-06 NOTE — Consult Note (Signed)
   [facility] CM Primary Care Navigator  11/06/2016  Kenneth Ryan [birth year] [id number]   Met with patient at the bedside to identify possible discharge needs.  Patient verbalized that he was not having any symptoms but had significant heart murmur on physical exam. He reports having cardiac evaluation, and was referred for surgical consult. He was then admitted for elective heart surgery as stated.  Patient endorses Dr. Ricardo Jericho with [facility] as the primary care provider.   Patient states using [facility] pharmacy in [place] to obtain medications with no difficulty. He manages his own medications at home.   Patient is independent with self care and able to drive prior to admission. Wife Kenneth Ryan) will be able to provide transportation to his doctors' appointments when needed.  Patient lives with wife and his brother-in law (on dialysis) lives with them. Wife will be his primary caregiver as stated.   Plan for discharge is home with wife's assistance and daughter Kenneth Ryan) will be able to assist with care if needed according to patient.   Patient expressed understanding to call primary care provider's office once discharged, for a post discharge follow-up appointment within a week or sooner if needs arise. Patient letter provided as a reminder.  He denies further needs or concerns at this time.    For additional questions please contact:  Edwena Felty A. Charissa Knowles, BSN, RN-BC [facility] PRIMARY CARE Navigator Cell: [phone]

## 2016-11-06 NOTE — Progress Notes (Signed)
Pacer turned off by Dr. Roxy Manns, he stated to roll and tape wires. This was completed as directed by RN will monitor patient. Pamela Intrieri, Bettina Gavia RN

## 2016-11-06 NOTE — Progress Notes (Signed)
Patient currently in chair. Will monitor patient. Kenneth Ryan, Bettina Gavia RN

## 2016-11-06 NOTE — Discharge Summary (Signed)
Physician Discharge Summary  Patient ID: Kenneth Ryan MRN: [id number] DOB/AGE: 04-19-1946 70 y.o.  Admit date: 11/03/2016 Discharge date: 11/09/2016  Admission Diagnoses:  Thoracic Aortic Aneurysm    Surgical eval, Cardiac Cath 09/29/16, TEE 09/17/16,Gated Cardiac CT 09/22/16  . Aortic Insuffiency  . 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 s/p Bentall procedure for aortic root aneurysm ([place]) 11/03/2016  . 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  . 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 Mitral valve prolapse   . Mitral regurgitation   . Aortic insufficiency due to bicuspid aortic valve   . Aortic valve, bicuspid 09/17/2015  . History of laryngeal cancer 09/17/2015  . Hoarseness 08/28/2015  . 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 The patient states that he has known he had a heart murmur all of his life. He moved [facility] approximately threeyears ago, and prior to that he had never been formally evaluated by a cardiologist. He was noted to have a prominent murmur on physical exam and referred for consultation last fall when he was first evaluated by Dr. [name]. The patient reported to be completely asymptomatic at that time, and echocardiogram revealed what was felt to be moderate aortic stenosis and moderate mitral regurgitation. Cardiac gated CT  angiogram of the heart was performed to evaluate the aortic root aneurysm which measured less than 5 cm in its greatest diameter. The patient was seen in follow-up recently by Dr. [name] and repeat echocardiogram revealed a significant drop in left ventricular ejection fraction with increased mitral regurgitation. The patient subsequently underwent transesophageal echocardiogram 09/17/2016. This confirmed the presence of a functionally bicuspid aortic valve with moderate to severe aortic insufficiency. There was mitral valve prolapse with severe mitral regurgitation caused by multiple ruptured chordae tendineae from the middle scallop of the anterior leaflet. Left ventricular function was reduced with ejection fraction estimated 40-45%. Left and right heart catheterization was performed 09/29/2016. This revealed no significant coronary artery disease and mild pulmonary hypertension. There were large V waves consistent with severe mitral regurgitation and severe aortic insufficiency. The patient was referred for surgical consultation.  The patient is married and lives with his wife in [place]. He has been retired since 2014 when he had his wife moved from [place] to [place] to be near their grandchildren. He admits that he has never been very active physically. He does not exercise on a regular basis but he reports no significant physical limitations. 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 He states that he does experience mild exertional shortness of breath but this occurs only one he walks up his driveway taking out the trash. With ordinary activities around the house he has no shortness of breath. He states that in the past when he used to go to the gym to exercise he might get some tightness across his chest walking on a treadmill.  He has not done this in quite some time. He denies any palpitations, dizzy spells, or syncope. He denies any history of resting shortness of  breath, PND, ororthopnea. He does get mild lower extremity edema when he has been on his feet for a long time.  Patient was electively for the procedure.   Discharged Condition: good   Hospital Course: The patient was admitted electively and taken to the operating room where he underwent the below described procedure.  The patient tolerated the procedure well.  The patient was transported to the surgical intensive care unit in stable condition. There were no intraoperative complications. All sponge instrument and needle counts are verified correct at completion of the operation.  Postoperative hospital course:   Overall, the patient has progressed nicely. He has had stable hemodynamics initially requiring some low-dose milrinone. He has been atrial paced. He has an expected acute blood loss anemia which is stable and mild. He has combined systolic and diastolic CHF with expected postoperative volume excess. This is responding to diuretics. His beta blockers are being held currently due to necessity of early pacing. He has been started on Coumadin. He is tolerating gradually increasing activities using cardiac rehabilitation modalities. Blood sugars have been under good control and with no history of diabetes they have been stopped. Incisions are healing well. Oxygen is weaned and he maintains good saturations on room air. He is tolerating routine pulmonary toilet for atelectasis. He was started on Coumadin on post operative day 1. Daily PT and INR was obtained. His last PT and INR were up to 19 and 1.57. He is on Coumadin 5 mg daily and per Dr. Roxy Manns this dose is to continue at discharge. Patient will get a PT and INR drawn on Wednesday 11/11/2016. INR goal is 2-2.5 as he has a mechanical AVR. Per Dr. Roxy Manns, patient is felt surgically stable for discharge today.  Consults: None  Significant Diagnostic Studies: Routine postop recovery and serial chest x-rays.  Treatments: CARDIOTHORACIC  SURGERY OPERATIVE NOTE  Date of Procedure:                11/03/2016  Preoperative Diagnosis:        Bicuspid Aortic Valve  Severe Aortic Insufficiency  Aortic Root Aneurysm  Severe Mitral Regurgitation  Postoperative Diagnosis:    Same  Procedure:        Bentall Aortic Root Replacement             On-X bileaflet mechanical valve synthetic root conduit (size 27/29 mm, ref #[id number], serial [id number])             Reimplantation of left main and right coronary arteries   Mitral Valve Repair             Complex valvuloplasty including artificial Gore-tex neochord placement x6             Sorin Memo 3D ring annuloplasty (size 28 mm, catalog [id number], serial [id number])  Surgeon:        Valentina Gu. Roxy Manns, MD  Assistant:       Sharalyn Ink. 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 rate 18, height 6' (1.829 m), weight 209 lb 9.6 oz (95.1 kg), SpO2 99 %.  General appearance: alert, cooperative and no distress Heart: regular rate and rhythm Lungs: clear to auscultation bilaterally Abdomen: soft, non-tender; bowel sounds normal; no masses,  no organomegaly Extremities: edema trace Wound: c/d/i without drainage  Disposition: 01-Home or Self Care  Discharge Instructions    Amb Referral to Cardiac Rehabilitation    Complete by:  As directed    Diagnosis:   Valve Repair   Valve:  Mitral       Medication List    TAKE these medications   acetaminophen 500 MG tablet Commonly known as:  TYLENOL Take 500-1,000 mg by mouth every 6 (six) hours as needed for mild pain (depends on pain level if takes 500 mg or 1000 mg).   aspirin 81 MG EC tablet Take 1 tablet (81 mg total) by mouth daily. Start taking on:  11/10/2016   dextromethorphan-guaiFENesin 30-600 MG 12hr tablet Commonly known as:  MUCINEX DM Take 1 tablet by mouth 2 (two) times daily as needed for cough.   fenofibrate 145 MG tablet Commonly known as:  TRICOR TAKE 1 TABLET(145 MG) BY MOUTH DAILY   furosemide 40 MG tablet Commonly known as:  LASIX Take 1 tablet (40 mg total) by mouth daily. For 3 days then stop. Start taking on:  11/10/2016   metoprolol succinate 25 MG 24 hr tablet Commonly known as:  TOPROL XL Take 1 tablet (25 mg total) by mouth daily.   MULTIVITAMIN PO Take 1 tablet by mouth daily.   oxyCODONE 5 MG immediate release tablet Commonly known as:  Oxy IR/ROXICODONE Take 5 mg by mouth every 4-6 hours PRN severe pain.   pantoprazole 40 MG tablet Commonly known as:  PROTONIX TAKE 1 TABLET BY MOUTH EVERY DAY   potassium chloride SA 20 MEQ tablet Commonly known as:  K-DUR,KLOR-CON Take 1 tablet (20 mEq total) by mouth daily. For 3 days then stop. Start taking on:  11/10/2016   warfarin 5 MG tablet Commonly known as:  COUMADIN Take 1 tablet (5 mg total) by mouth daily at 6 PM. Or as directed.      Follow-up Information    Rexene Alberts, MD Follow up.   Specialty:  Cardiothoracic Surgery Why:  Appointment see Dr. Roxy Manns on 12/07/2016 at 9:30 AM. Please obtain a chest x-ray at 9 AM and [facility] imaging. [facility] is located in the same office complex. Contact information: [address] [place] [address] [phone]        Cecilie Kicks, NP Follow up on 11/23/2016.   Specialties:  Cardiology, Radiology Why:  @ 10am  (please show up 10 minutes early) Contact information: [place] [address] [phone]        [facility]. Go on 11/11/2016.   Why:  This appointment is to have PT and INR drawn (as is on Coumadin for mechanical aortic valve, INR goal 2-2.5). Appointment time is at @ 10:15 am Contact information: [place] [phone]        The patient has been discharged on:   1.Beta Blocker:  Yes [ y  ]  No   [   ]                              If No, reason:  2.Ace Inhibitor/ARB: Yes [   ]                                     No  [ n   ]                                     If No, reason:bp low relatively  3.Statin:   Yes [   ]                  No  [  n ]                  If No, reason:on fenofibrate and no CAD  4.Shela CommonsVelta Addison  [ y  ]                  No   [   ]                  If No, reason:   Signed: Verba Ainley M PA-C 11/09/2016, 2:02 PM

## 2016-11-06 NOTE — Progress Notes (Signed)
Patient ambulated in hallway will monitor. Temple Sporer, Bettina Gavia RN

## 2016-11-06 NOTE — Discharge Instructions (Signed)
Aortic Valve Replacement, Care After °Refer to this sheet in the next few weeks. These instructions provide you with information about caring for yourself after your procedure. Your health care provider may also give you more specific instructions. Your treatment has been planned according to current medical practices, but problems sometimes occur. Call your health care provider if you have any problems or questions after your procedure. °What can I expect after the procedure? °After the procedure, it is common to have: °· Pain around your incision area. °· A small amount of blood or clear fluid coming from your incision. ° °Follow these instructions at home: °Eating and drinking ° °· Follow instructions from your health care provider about eating or drinking restrictions. °? Limit alcohol intake to no more than 1 drink per day for nonpregnant women and 2 drinks per day for men. One drink equals 12 oz of beer, 5 oz of wine, or 1½ oz of hard liquor. °? Limit how much caffeine you drink. Caffeine can affect your heart's rate and rhythm. °· Drink enough fluid to keep your urine clear or pale yellow. °· Eat a heart-healthy diet. This should include plenty of fresh fruits and vegetables. If you eat meat, it should be lean cuts. Avoid foods that are: °? High in salt, saturated fat, or sugar. °? Canned or highly processed. °? Fried. °Activity °· Return to your normal activities as told by your health care provider. Ask your health care provider what activities are safe for you. °· Exercise regularly once you have recovered, as told by your health care provider. °· Avoid sitting for more than 2 hours at a time without moving. Get up and move around at least once every 1-2 hours. This helps to prevent blood clots in the legs. °· Do not lift anything that is heavier than 10 lb (4.5 kg) until your health care provider approves. °· Avoid pushing or pulling things with your arms until your health care provider approves. This  includes pulling on handrails to help you climb stairs. °Incision care ° °· Follow instructions from your health care provider about how to take care of your incision. Make sure you: °? Wash your hands with soap and water before you change your bandage (dressing). If soap and water are not available, use hand sanitizer. °? Change your dressing as told by your health care provider. °? Leave stitches (sutures), skin glue, or adhesive strips in place. These skin closures may need to stay in place for 2 weeks or longer. If adhesive strip edges start to loosen and curl up, you may trim the loose edges. Do not remove adhesive strips completely unless your health care provider tells you to do that. °· Check your incision area every day for signs of infection. Check for: °? More redness, swelling, or pain. °? More fluid or blood. °? Warmth. °? Pus or a bad smell. °Medicines °· Take over-the-counter and prescription medicines only as told by your health care provider. °· If you were prescribed an antibiotic medicine, take it as told by your health care provider. Do not stop taking the antibiotic even if you start to feel better. °Travel °· Avoid airplane travel for as long as told by your health care provider. °· When you travel, bring a list of your medicines and a record of your medical history with you. Carry your medicines with you. °Driving °· Ask your health care provider when it is safe for you to drive. Do not drive until your health   not drive until your health care provider approves. °· Do not drive or operate heavy machinery while taking prescription pain medicine. °Lifestyle  ° °· Do not use any tobacco products, such as cigarettes, chewing tobacco, or e-cigarettes. If you need help quitting, ask your health care provider. °· Resume sexual activity as told by your health care provider. Do not use medicines for erectile dysfunction unless your health care provider approves, if this applies. °· Work with your health care provider to keep  your blood pressure and cholesterol under control, and to manage any other heart conditions that you have. °· Maintain a healthy weight. °General instructions  °· Do not take baths, swim, or use a hot tub until your health care provider approves. °· Do not strain to have a bowel movement. °· Avoid crossing your legs while sitting down. °· Check your temperature every day for a fever. A fever may be a sign of infection. °· If you are a woman and you plan to become pregnant, talk with your health care provider before you become pregnant. °· Wear compression stockings if your health care provider instructs you to do this. These stockings help to prevent blood clots and reduce swelling in your legs. °· Tell all health care providers who care for you that you have an artificial (prosthetic) aortic valve. If you have or have had heart disease or endocarditis, tell all health care providers about these conditions as well. °· Keep all follow-up visits as told by your health care provider. This is important. °Contact a health care provider if: °· You develop a skin rash. °· You experience sudden, unexplained changes in your weight. °· You have more redness, swelling, or pain around your incision. °· You have more fluid or blood coming from your incision. °· Your incision feels warm to the touch. °· You have pus or a bad smell coming from your incision. °· You have a fever. °Get help right away if: °· You develop chest pain that is different from the pain coming from your incision. °· You develop shortness of breath or difficulty breathing. °· You start to feel light-headed. °These symptoms may represent a serious problem that is an emergency. Do not wait to see if the symptoms will go away. Get medical help right away. Call your local emergency services (911 in the [place] Do not drive yourself to the hospital. °This information is not intended to replace advice given to you by your health care provider. Make sure you discuss  any questions you have with your health care provider. °Document Released: 06/25/2005 Document Revised: 05/14/2016 Document Reviewed: 11/10/2015 °Elsevier Interactive Patient Education © 2017 [employer] ° °Information on my medicine - Coumadin®   (Warfarin) ° °This medication education was reviewed with me or my healthcare representative as part of my discharge preparation. ° °Why was Coumadin prescribed for you? °Coumadin was prescribed for you because you have a blood clot or a medical condition that can cause an increased risk of forming blood clots. Blood clots can cause serious health problems by blocking the flow of blood to the heart, lung, or brain. Coumadin can prevent harmful blood clots from forming. °As a reminder your indication for Coumadin is:   Blood Clot Prevention After Heart Valve Surgery ° °What test will check on my response to Coumadin? °While on Coumadin (warfarin) you will need to have an INR test regularly to ensure that your dose is keeping you in the desired range. The INR (international normalized ratio)   number is calculated from the result of the laboratory test called prothrombin time (PT). ° °If an INR APPOINTMENT HAS NOT ALREADY BEEN MADE FOR YOU please schedule an appointment to have this lab work done by your health care provider within 7 days. °Your INR goal is usually a number between:  2 to 3 or your provider may give you a more narrow range like 2-2.5.  Ask your health care provider during an office visit what your goal INR is. ° °What  do you need to  know  About  COUMADIN? °Take Coumadin (warfarin) exactly as prescribed by your healthcare provider about the same time each day.  DO NOT stop taking without talking to the doctor who prescribed the medication.  Stopping without other blood clot prevention medication to take the place of Coumadin may increase your risk of developing a new clot or stroke.  Get refills before you run out. ° °What do you do if you miss a dose? °If  you miss a dose, take it as soon as you remember on the same day then continue your regularly scheduled regimen the next day.  Do not take two doses of Coumadin at the same time. ° °Important Safety Information °A possible side effect of Coumadin (Warfarin) is an increased risk of bleeding. You should call your healthcare provider right away if you experience any of the following: °? Bleeding from an injury or your nose that does not stop. °? Unusual colored urine (red or dark brown) or unusual colored stools (red or black). °? Unusual bruising for unknown reasons. °? A serious fall or if you hit your head (even if there is no bleeding). ° °Some foods or medicines interact with Coumadin® (warfarin) and might alter your response to warfarin. To help avoid this: °? Eat a balanced diet, maintaining a consistent amount of Vitamin K. °? Notify your provider about major diet changes you plan to make. °? Avoid alcohol or limit your intake to 1 drink for women and 2 drinks for men per day. °(1 drink is 5 oz. wine, 12 oz. beer, or 1.5 oz. liquor.) ° °Make sure that ANY health care provider who prescribes medication for you knows that you are taking Coumadin (warfarin).  Also make sure the healthcare provider who is monitoring your Coumadin knows when you have started a new medication including herbals and non-prescription products. ° °Coumadin® (Warfarin)  Major Drug Interactions  °Increased Warfarin Effect Decreased Warfarin Effect  °Alcohol (large quantities) °Antibiotics (esp. Septra/Bactrim, Flagyl, Cipro) °Amiodarone (Cordarone) °Aspirin (ASA) °Cimetidine (Tagamet) °Megestrol (Megace) °NSAIDs (ibuprofen, naproxen, etc.) °Piroxicam (Feldene) °Propafenone (Rythmol SR) °Propranolol (Inderal) °Isoniazid (INH) °Posaconazole (Noxafil) Barbiturates (Phenobarbital) °Carbamazepine (Tegretol) °Chlordiazepoxide (Librium) °Cholestyramine (Questran) °Griseofulvin °Oral Contraceptives °Rifampin °Sucralfate (Carafate) °Vitamin K   ° °Coumadin® (Warfarin) Major Herbal Interactions  °Increased Warfarin Effect Decreased Warfarin Effect  °Garlic °Ginseng °Ginkgo biloba Coenzyme Q10 °Green tea °St. John’s wort   ° °Coumadin® (Warfarin) FOOD Interactions  °Eat a consistent number of servings per week of foods HIGH in Vitamin K °(1 serving = ½ cup)  °Collards (cooked, or boiled & drained) °Kale (cooked, or boiled & drained) °Mustard greens (cooked, or boiled & drained) °Parsley *serving size only = ¼ cup °Spinach (cooked, or boiled & drained) °Swiss chard (cooked, or boiled & drained) °Turnip greens (cooked, or boiled & drained)  °Eat a consistent number of servings per week of foods MEDIUM-HIGH in Vitamin K °(1 serving = 1 cup)  °Asparagus (cooked, or boiled & drained) °Broccoli (cooked,   boiled & drained, or raw & chopped) °Brussel sprouts (cooked, or boiled & drained) *serving size only = ½ cup °Lettuce, raw (green leaf, endive, romaine) °Spinach, raw °Turnip greens, raw & chopped  ° °These websites have more information on Coumadin (warfarin):  www.coumadin.com; °www.ahrq.gov/consumer/coumadin.htm; ° ° °

## 2016-11-06 NOTE — Progress Notes (Signed)
Patient ambulated in hallway this am back in room in chair  will monitor patient. Mauricio Dahlen, Bettina Gavia RN

## 2016-11-07 LAB — PROTIME-INR
INR: 1.2
PROTHROMBIN TIME: 15.3 s — AB (ref 11.4–15.2)

## 2016-11-07 NOTE — Progress Notes (Signed)
Patient ambulated in the hallway byself, now back in the room will continue to monitor

## 2016-11-07 NOTE — Progress Notes (Signed)
CARDIAC REHAB PHASE I   PRE:  Rate/Rhythm: 79  BP:  Sitting: 134/64     SaO2: 100  MODE:  Ambulation: 400 ft   POST:  Rate/Rhythm: 92 BP:  Sitting: 131/55     SaO2: 96%  12:05pm-12:40pm Patient ambulated well independently. C/o pain in side. No other complaints. Placed in chair with food. Education complete. Interested in cardiac rehab.   [place], MS 11/07/2016 12:34 PM

## 2016-11-07 NOTE — Progress Notes (Signed)
Patient education completed fro removal of pacer wires, patient verbalised understanding, 3 pacer wires removed, patient now on bed rest, bed in lowest position , call bell within reach ,vital signs remain stable , will continue to monitor

## 2016-11-07 NOTE — Progress Notes (Addendum)
      [address]       [place] [address]             [phone]      4 Days Post-Op Procedure(s) (LRB): BENTALL PROCEDURE AORTIC ROOT REPLACEMENT (N/A) MITRAL VALVE REPAIR (MVR) (N/A) TRANSESOPHAGEAL ECHOCARDIOGRAM (TEE) (N/A)   Subjective:  Kenneth Ryan has no complaints.  Feels he is doing pretty good.  + ambulation  + BM  Objective: Vital signs in last 24 hours: Temp:  [97.9 F (36.6 C)-98.7 F (37.1 C)] 98.7 F (37.1 C) (11/18 0506) Pulse Rate:  [73-76] 76 (11/18 0506) Cardiac Rhythm: Atrial paced (11/17 2050) Resp:  [18-20] 18 (11/18 0506) BP: (115-136)/(59-71) 115/63 (11/18 0506) SpO2:  [97 %-99 %] 97 % (11/18 0506) Weight:  [213 lb 14.4 oz (97 kg)] 213 lb 14.4 oz (97 kg) (11/18 0506)  Intake/Output from previous day: 11/17 0701 - 11/18 0700 In: 720 [P.O.:720] Out: 50 [Urine:50]  General appearance: alert, cooperative and no distress Heart: regular rate and rhythm Lungs: clear to auscultation bilaterally Abdomen: soft, non-tender; bowel sounds normal; no masses,  no organomegaly Extremities: edema 1-2+ pitting Wound: clean and dry  Lab Results:  Recent Labs  11/05/16 0450 11/06/16 0237  WBC 12.2* 9.9  HGB 10.4* 9.9*  HCT 31.5* 29.3*  PLT 101* 105*   BMET:  Recent Labs  11/05/16 0450 11/06/16 0237  NA 139 137  K 4.2 4.0  CL 107 103  CO2 28 27  GLUCOSE 133* 122*  BUN 11 15  CREATININE 0.99 0.97  CALCIUM 8.3* 8.5*    PT/INR:  Recent Labs  11/07/16 0323  LABPROT 15.3*  INR 1.20   ABG    Component Value Date/Time   PHART 7.349 (L) 11/04/2016 0518   HCO3 23.0 11/04/2016 0518   TCO2 24 11/04/2016 0518   ACIDBASEDEF 3.0 (H) 11/04/2016 0518   O2SAT 94.0 11/04/2016 0518   CBG (last 3)   Recent Labs  11/05/16 2140 11/06/16 0629 11/06/16 1118  GLUCAP 108* 111* 135*    Assessment/Plan: S/P Procedure(s) (LRB): BENTALL PROCEDURE AORTIC ROOT REPLACEMENT (N/A) MITRAL VALVE REPAIR (MVR) (N/A) TRANSESOPHAGEAL  ECHOCARDIOGRAM (TEE) (N/A)  1. CV- NSR, rate remains in the 70s- will d/c EPW today 2. INR 1.20, will repeat 5 mg today.. If no rise, will increase Coumadin tomorrow 3. Renal- creatinine has been stable, weight is mildly elevated, continue Lasix 4. Dispo- patient stable, maintaining NSR, will d/c EPW today, continue coumadin if INR doesn't bump will increase dose tomorrow... Home when INR is closer to therapeutic   LOS: 4 days    BARRETT, ERIN 11/07/2016   I have seen and examined the patient and agree with the assessment and plan as outlined.  Rexene Alberts, MD 11/07/2016 10:51 AM

## 2016-11-07 NOTE — Progress Notes (Signed)
Patient ambulated twice in the hall way by himself, well tolerated, patient now resting in the recliner call light within reach will continue to monitor

## 2016-11-08 DIAGNOSIS — I511 Rupture of chordae tendineae, not elsewhere classified: Secondary | ICD-10-CM | POA: Diagnosis not present

## 2016-11-08 DIAGNOSIS — I341 Nonrheumatic mitral (valve) prolapse: Secondary | ICD-10-CM | POA: Diagnosis not present

## 2016-11-08 DIAGNOSIS — E781 Pure hyperglyceridemia: Secondary | ICD-10-CM | POA: Diagnosis not present

## 2016-11-08 DIAGNOSIS — D62 Acute posthemorrhagic anemia: Secondary | ICD-10-CM | POA: Diagnosis not present

## 2016-11-08 DIAGNOSIS — I5042 Chronic combined systolic (congestive) and diastolic (congestive) heart failure: Secondary | ICD-10-CM | POA: Diagnosis not present

## 2016-11-08 DIAGNOSIS — Z85828 Personal history of other malignant neoplasm of skin: Secondary | ICD-10-CM | POA: Diagnosis not present

## 2016-11-08 DIAGNOSIS — I272 Pulmonary hypertension, unspecified: Secondary | ICD-10-CM | POA: Diagnosis not present

## 2016-11-08 DIAGNOSIS — Q231 Congenital insufficiency of aortic valve: Secondary | ICD-10-CM | POA: Diagnosis not present

## 2016-11-08 DIAGNOSIS — I712 Thoracic aortic aneurysm, without rupture: Secondary | ICD-10-CM | POA: Diagnosis not present

## 2016-11-08 DIAGNOSIS — I340 Nonrheumatic mitral (valve) insufficiency: Secondary | ICD-10-CM | POA: Diagnosis not present

## 2016-11-08 DIAGNOSIS — Z8521 Personal history of malignant neoplasm of larynx: Secondary | ICD-10-CM | POA: Diagnosis not present

## 2016-11-08 DIAGNOSIS — J9811 Atelectasis: Secondary | ICD-10-CM | POA: Diagnosis not present

## 2016-11-08 LAB — PROTIME-INR
INR: 1.43
PROTHROMBIN TIME: 17.6 s — AB (ref 11.4–15.2)

## 2016-11-08 MED ORDER — METOPROLOL TARTRATE 12.5 MG HALF TABLET
12.5000 mg | ORAL_TABLET | Freq: Two times a day (BID) | ORAL | Status: DC
  Administered 2016-11-08 – 2016-11-09 (×3): 12.5 mg via ORAL
  Filled 2016-11-08 (×3): qty 1

## 2016-11-08 NOTE — Progress Notes (Addendum)
      [address]       [place] [address]             [phone]      5 Days Post-Op Procedure(s) (LRB): BENTALL PROCEDURE AORTIC ROOT REPLACEMENT (N/A) MITRAL VALVE REPAIR (MVR) (N/A) TRANSESOPHAGEAL ECHOCARDIOGRAM (TEE) (N/A)   Subjective:  Kenneth Ryan has no complaints this morning.  He denies chest pain and shortness of breath.  He is ambulating independently.  + BM  Objective: Vital signs in last 24 hours: Temp:  [98.1 F (36.7 C)-98.4 F (36.9 C)] 98.4 F (36.9 C) (11/19 0855) Pulse Rate:  [67-82] 73 (11/19 0855) Cardiac Rhythm: Heart block;Bundle branch block (11/18 1900) Resp:  [18-20] 18 (11/19 0855) BP: (116-141)/(54-67) 125/55 (11/19 0855) SpO2:  [93 %-98 %] 94 % (11/19 0855) Weight:  [213 lb 1.6 oz (96.7 kg)] 213 lb 1.6 oz (96.7 kg) (11/19 0539)  Intake/Output from previous day: 11/18 0701 - 11/19 0700 In: 480 [P.O.:480] Out: 2025 [Urine:2025] Intake/Output this shift: Total I/O In: 240 [P.O.:240] Out: 250 [Urine:250]  General appearance: alert, cooperative and no distress Heart: regular rate and rhythm and with PVCs Lungs: clear to auscultation bilaterally Abdomen: soft, non-tender; bowel sounds normal; no masses,  no organomegaly Extremities: edema trace Wound: clean and dry  Lab Results:  Recent Labs  11/06/16 0237  WBC 9.9  HGB 9.9*  HCT 29.3*  PLT 105*   BMET:  Recent Labs  11/06/16 0237  NA 137  K 4.0  CL 103  CO2 27  GLUCOSE 122*  BUN 15  CREATININE 0.97  CALCIUM 8.5*    PT/INR:  Recent Labs  11/08/16 0413  LABPROT 17.6*  INR 1.43   ABG    Component Value Date/Time   PHART 7.349 (L) 11/04/2016 0518   HCO3 23.0 11/04/2016 0518   TCO2 24 11/04/2016 0518   ACIDBASEDEF 3.0 (H) 11/04/2016 0518   O2SAT 94.0 11/04/2016 0518   CBG (last 3)   Recent Labs  11/05/16 2140 11/06/16 0629 11/06/16 1118  GLUCAP 108* 111* 135*    Assessment/Plan: S/P Procedure(s) (LRB): BENTALL PROCEDURE AORTIC ROOT  REPLACEMENT (N/A) MITRAL VALVE REPAIR (MVR) (N/A) TRANSESOPHAGEAL ECHOCARDIOGRAM (TEE) (N/A)  1. CV- 1st degree AV block last night, continues to have PVC's, however now with R-T- not currently on BB... Will get 12 lead EKG 2. INR 1.43, continue Coumadin at 5 mg daily 3. Pulm- no acute issues, continue IS 4. Renal- remains hypervolemic, responding well to lasix, will repeat BMET in AM 5. Dispo- patient asymptomatic this morning, questionable R-T PVCS, with new 1st degree heart block will get 12 lead EKG, continue coumadin INR is starting to rise, will review EKG with Dr. Roxy Manns, continue current care   LOS: 5 days    BARRETT, ERIN 11/08/2016  I have seen and examined the patient and agree with the assessment and plan as outlined.  EKG reveals NSR w/ normal PR interval.  No significant AV block at all.  Will resume low dose beta blocker.    Kenneth Alberts, MD 11/08/2016 10:47 AM

## 2016-11-09 ENCOUNTER — Encounter (HOSPITAL_COMMUNITY): Payer: Self-pay | Admitting: General Practice

## 2016-11-09 LAB — BASIC METABOLIC PANEL
Anion gap: 9 (ref 5–15)
BUN: 7 mg/dL (ref 6–20)
CO2: 27 mmol/L (ref 22–32)
CREATININE: 0.92 mg/dL (ref 0.61–1.24)
Calcium: 9.2 mg/dL (ref 8.9–10.3)
Chloride: 101 mmol/L (ref 101–111)
GFR calc Af Amer: >60 mL/min (ref >60)
Glucose, Bld: 190 mg/dL — ABNORMAL HIGH (ref 65–99)
Potassium: 3.6 mmol/L (ref 3.5–5.1)
SODIUM: 137 mmol/L (ref 135–145)

## 2016-11-09 LAB — PROTIME-INR
INR: 1.57
PROTHROMBIN TIME: 19 s — AB (ref 11.4–15.2)

## 2016-11-09 MED ORDER — OXYCODONE HCL 5 MG PO TABS: ORAL_TABLET | ORAL | 0 refills | Status: DC

## 2016-11-09 MED ORDER — WARFARIN SODIUM 5 MG PO TABS: 5.0000 mg | ORAL_TABLET | Freq: Every day | ORAL | 1 refills | Status: DC

## 2016-11-09 MED ORDER — POTASSIUM CHLORIDE CRYS ER 20 MEQ PO TBCR: 40.0000 meq | EXTENDED_RELEASE_TABLET | Freq: Once | ORAL | Status: DC

## 2016-11-09 MED ORDER — POTASSIUM CHLORIDE CRYS ER 20 MEQ PO TBCR: 20.0000 meq | EXTENDED_RELEASE_TABLET | Freq: Every day | ORAL | 0 refills | Status: DC

## 2016-11-09 MED ORDER — ASPIRIN 81 MG PO TBEC: 81.0000 mg | DELAYED_RELEASE_TABLET | Freq: Every day | ORAL | Status: AC

## 2016-11-09 MED ORDER — FUROSEMIDE 40 MG PO TABS: 40.0000 mg | ORAL_TABLET | Freq: Every day | ORAL | 0 refills | Status: DC

## 2016-11-09 MED ORDER — DM-GUAIFENESIN ER 30-600 MG PO TB12: 1.0000 | ORAL_TABLET | Freq: Two times a day (BID) | ORAL | Status: DC | PRN

## 2016-11-09 NOTE — Progress Notes (Addendum)
      [address]       [place] [address]             [phone]      6 Days Post-Op Procedure(s) (LRB): BENTALL PROCEDURE AORTIC ROOT REPLACEMENT (N/A) MITRAL VALVE REPAIR (MVR) (N/A) TRANSESOPHAGEAL ECHOCARDIOGRAM (TEE) (N/A) Subjective: Shares that he slept last night. Feeling good this morning.   Objective: Vital signs in last 24 hours: Temp:  [97.7 F (36.5 C)-98.4 F (36.9 C)] 98 F (36.7 C) (11/20 0332) Pulse Rate:  [68-76] 75 (11/20 0332) Cardiac Rhythm: Normal sinus rhythm (11/19 1900) Resp:  [18] 18 (11/20 0332) BP: (120-141)/(55-65) 141/64 (11/20 0332) SpO2:  [94 %-99 %] 99 % (11/20 0332) Weight:  [209 lb 9.6 oz (95.1 kg)] 209 lb 9.6 oz (95.1 kg) (11/20 0332)     Intake/Output from previous day: 11/19 0701 - 11/20 0700 In: 480 [P.O.:480] Out: 650 [Urine:650] Intake/Output this shift: No intake/output data recorded.  General appearance: alert, cooperative and no distress Heart: regular rate and rhythm Lungs: clear to auscultation bilaterally Abdomen: soft, non-tender; bowel sounds normal; no masses,  no organomegaly Extremities: edema trace Wound: c/d/i without drainage  Lab Results: No results for input(s): WBC, HGB, HCT, PLT in the last 72 hours. BMET: No results for input(s): NA, K, CL, CO2, GLUCOSE, BUN, CREATININE, CALCIUM in the last 72 hours.  PT/INR:  Recent Labs  11/09/16 0314  LABPROT 19.0*  INR 1.57   ABG    Component Value Date/Time   PHART 7.349 (L) 11/04/2016 0518   HCO3 23.0 11/04/2016 0518   TCO2 24 11/04/2016 0518   ACIDBASEDEF 3.0 (H) 11/04/2016 0518   O2SAT 94.0 11/04/2016 0518   CBG (last 3)   Recent Labs  11/06/16 1118  GLUCAP 135*    Assessment/Plan: S/P Procedure(s) (LRB): BENTALL PROCEDURE AORTIC ROOT REPLACEMENT (N/A) MITRAL VALVE REPAIR (MVR) (N/A) TRANSESOPHAGEAL ECHOCARDIOGRAM (TEE) (N/A)  1. CV-NSR in the 86s. Tolerating low-dose beta blocker.  2. Pulm-On room air without issues.  Encouraged hourly IS use.  3. Renal-BMET pending. Weight continued to trend down. On Lasix daily.  4. Endo-blood glucose level well controlled 5. Anticoagulation-On coumadin. INR 1.57 this morning. On Coumadin 5mg  daily.  Plan: Continue diuretics for fluid overload, however improving. Continue Coumadin, INR rising. Continue TID ambulation.  Will discuss discharge plans with Dr. Roxy Manns.     LOS: 6 days    Elgie Collard 11/09/2016   I have seen and examined the patient and agree with the assessment and plan as outlined.  D/C home today.  Continue lasix/potassium x 3 days.  Coumadin 5 mg/day.  Instructions given.  Rexene Alberts, MD 11/09/2016 8:35 AM

## 2016-11-09 NOTE — Care Management Important Message (Signed)
Important Message  Patient Details  Name: Kenneth Ryan MRN: [id number] Date of Birth: Jul 24, 1946   Medicare Important Message Given:  Yes    Nathen May 11/09/2016, 10:36 AM

## 2016-11-09 NOTE — Progress Notes (Signed)
Pt refused to have a new IV put in after his was removed for leakage.  When asked again this AM, pt declined a new IV. Will continue to monitor pt. Lupita Dawn, RN

## 2016-11-09 NOTE — Progress Notes (Addendum)
Pt. Discharged to home  Pt. D/C'd via wheelchair with volunteers Discharge information reviewed and given All personal belongings given to Pt.  Education discussed with teach back Rx given to patient Tele d/c  sutures removed

## 2016-11-09 NOTE — Progress Notes (Signed)
Pt has already walked today. Reviewed ed and answered questions regarding Coumadin. He has seen the video. Gave him instructions for [facility] d/c video to watch later. Will refer to [place] Yves Dill CES, ACSM 9:41 AM 11/09/2016

## 2016-11-11 ENCOUNTER — Encounter: Payer: Self-pay | Admitting: Cardiology

## 2016-11-11 ENCOUNTER — Ambulatory Visit (INDEPENDENT_AMBULATORY_CARE_PROVIDER_SITE_OTHER): Payer: PPO | Admitting: Pharmacist

## 2016-11-11 DIAGNOSIS — I719 Aortic aneurysm of unspecified site, without rupture: Secondary | ICD-10-CM | POA: Diagnosis not present

## 2016-11-11 DIAGNOSIS — I7121 Aneurysm of the ascending aorta, without rupture: Secondary | ICD-10-CM

## 2016-11-11 DIAGNOSIS — Z9889 Other specified postprocedural states: Secondary | ICD-10-CM

## 2016-11-11 DIAGNOSIS — Z954 Presence of other heart-valve replacement: Secondary | ICD-10-CM | POA: Diagnosis not present

## 2016-11-11 DIAGNOSIS — Z5181 Encounter for therapeutic drug level monitoring: Secondary | ICD-10-CM | POA: Diagnosis not present

## 2016-11-11 LAB — POCT INR: INR: 2.3

## 2016-11-18 ENCOUNTER — Ambulatory Visit (INDEPENDENT_AMBULATORY_CARE_PROVIDER_SITE_OTHER): Payer: PPO | Admitting: *Deleted

## 2016-11-18 DIAGNOSIS — I719 Aortic aneurysm of unspecified site, without rupture: Secondary | ICD-10-CM | POA: Diagnosis not present

## 2016-11-18 DIAGNOSIS — I7121 Aneurysm of the ascending aorta, without rupture: Secondary | ICD-10-CM

## 2016-11-18 DIAGNOSIS — Z9889 Other specified postprocedural states: Secondary | ICD-10-CM | POA: Diagnosis not present

## 2016-11-18 DIAGNOSIS — Z954 Presence of other heart-valve replacement: Secondary | ICD-10-CM | POA: Diagnosis not present

## 2016-11-18 DIAGNOSIS — Z5181 Encounter for therapeutic drug level monitoring: Secondary | ICD-10-CM | POA: Diagnosis not present

## 2016-11-18 DIAGNOSIS — N400 Enlarged prostate without lower urinary tract symptoms: Secondary | ICD-10-CM | POA: Diagnosis not present

## 2016-11-18 DIAGNOSIS — R972 Elevated prostate specific antigen [PSA]: Secondary | ICD-10-CM | POA: Diagnosis not present

## 2016-11-18 LAB — POCT INR: INR: 2

## 2016-11-19 ENCOUNTER — Other Ambulatory Visit: Payer: Self-pay | Admitting: *Deleted

## 2016-11-19 NOTE — Patient Outreach (Signed)
Initial [facility] Telephone assessment attempted. I left a message and requested a return call.  Deloria Lair [facility] [phone]

## 2016-11-20 ENCOUNTER — Encounter: Payer: Self-pay | Admitting: Family Medicine

## 2016-11-22 NOTE — Progress Notes (Signed)
Cardiology Office Note   Date:  11/23/2016   ID:  [name], DOB [birth year], MRN [id number]  PCP:  Tammi Sou, MD  Cardiologist:  Dr. [name]    Chief Complaint  Patient presents with  . Hospitalization Follow-up    for AVR and mitral valve replacement.      History of Present Illness: Kenneth Ryan is a 70 y.o. male who presents for post hospitalization for Bentall aortic root replacementOn-X bileaflet mechanical valve synthetic root conduit (size 27/29 mm, ref#[id number], serial [id number]) and mitral valve repair- Complex valvuloplasty including artificial Gore-tex neochord placement x6, Sorin Memo 3D ring annuloplasty (size 28 mm, catalog #[id number], serial [id number]  For bicuspid aortic valve and moderate to severe mitral regurgitation.  D/c'd on coumadin and recent INR  2.0 on 11/18/16 and to be checked today.  Cath prior to surgery EF 55-65%, no significant CAD.  Mild pulmonary HTN  Severe 4+ aortic regurgitation and severe mitral regurg.   Today feels well.  Some incisional pain not severe.  Is walking mostly inside.  Appetite is good.  He is sleeping well.  He has been out and about with his wife without any problems.  No bleeding issues.  No SOB, no edema.    Past Medical History:  Diagnosis Date  . Aortic insufficiency due to bicuspid aortic valve    plan is for mechanical aortic valve to be placed 10/2016  . Aortic root aneurysm (HCC)    4.8 cm by cardiac gated CTA: Plan for repair by CT surg 10/2016  . Basal cell carcinoma    nose  . Bicuspid aortic valve    TEE 08/2016 mod/sev AR w/functional bicuspid AV  . Chronic hoarseness    secondary to hx of laryngeal surgery  . GERD (gastroesophageal reflux disease)   . Headache   . Heart murmur   . History of tobacco abuse   . Hypertriglyceridemia   . Increased prostate specific antigen (PSA) velocity 09/2016   saw Dr. Jeffie Pollock 11/18/16: plan is to repeat PSA 6 wks and if still same/up then plan TRUS+ bx.  .  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  Marland Kitchen Lesion of vocal cord   . Mitral regurgitation    severe by TEE 08/2016: plan is for MV repair (?maybe replacement?) by CT surg 10/2016.  . Mitral valve prolapse   . PONV (postoperative nausea and vomiting)    pt denies this 10/30/16  . 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 s/p Bentall procedure for aortic root aneurysm ([place]) 11/03/2016  . 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   Complex valvuloplasty including artificial Gore-tex neochord placement x6 and 28 mm Sorin Memo 3D ring annuloplasty  . Thoracic ascending aortic aneurysm (HCC)    48 mm 09/2015    Past Surgical History:  Procedure Laterality Date  . BENTALL PROCEDURE N/A 11/03/2016   Procedure: BENTALL PROCEDURE AORTIC ROOT REPLACEMENT;  Surgeon: Rexene Alberts, MD;  Location: [place];  Service: Open Heart Surgery;  Laterality: N/A;  . CARDIAC CATHETERIZATION N/A 09/29/2016   Procedure: Right/Left Heart Cath and Coronary Angiography;  Surgeon: Jettie Booze, MD;  Location: [place] [facility];  Service: Cardiovascular;  Laterality: N/A;  . CHOLECYSTECTOMY  1980  .  COLONOSCOPY  2003 and 2013   Both normal.  . DIRECT LARYNGOSCOPY Left 10/17/2015   Procedure: MICRO DIRECT LARYNGOSCOPY WITH FROZEN SECTION;  Surgeon: Jodi Marble, MD;  Location: [place];  Service: ENT;  Laterality: Left;  . MITRAL VALVE REPAIR N/A 11/03/2016   Procedure: MITRAL VALVE REPAIR (MVR);  Surgeon: Rexene Alberts, MD;  Location: [place];  Service: Open Heart Surgery;  Laterality: N/A;  . PERIPHERAL VASCULAR CATHETERIZATION N/A 09/29/2016   Procedure: Abdominal Aortogram;  Surgeon: Jettie Booze, MD;   Location: [place] [facility];  Service: Cardiovascular;  Laterality: N/A;  . PERIPHERAL VASCULAR CATHETERIZATION N/A 09/29/2016   Procedure: Thoracic Aortogram;  Surgeon: Jettie Booze, MD;  Location: [place] [facility];  Service: Cardiovascular;  Laterality: N/A;  . TEE WITHOUT CARDIOVERSION N/A 09/17/2016   Procedure: TRANSESOPHAGEAL ECHOCARDIOGRAM (TEE);  Surgeon: Jerline Pain, MD;  Location: [place];  Service: Cardiovascular;  Laterality: N/A;  . TEE WITHOUT CARDIOVERSION N/A 11/03/2016   Procedure: TRANSESOPHAGEAL ECHOCARDIOGRAM (TEE);  Surgeon: Rexene Alberts, MD;  Location: [place];  Service: Open Heart Surgery;  Laterality: N/A;  . TONSILECTOMY/ADENOIDECTOMY WITH MYRINGOTOMY  1960  . 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  Mod/severe AR w/functional bicuspid AV.  EF 45%.  . TRANSTHORACIC ECHOCARDIOGRAM  07/2015   Bicuspid aortic valve + mod mitral regurg  . Vocal cord biopsy  09/2015   No atypia or inflammation.  Some fibrous changes that are possibly from hx of radiation therapy.     Current Outpatient Prescriptions  Medication Sig Dispense Refill  . acetaminophen (TYLENOL) 500 MG tablet Take 500-1,000 mg by mouth every 6 (six) hours as needed for mild pain (depends on pain level if takes 500 mg or 1000 mg).     Marland Kitchen aspirin EC 81 MG EC tablet Take 1 tablet (81 mg total) by mouth daily.    . fenofibrate (TRICOR) 145 MG tablet TAKE 1 TABLET(145 MG) BY MOUTH DAILY 90 tablet 3  . metoprolol succinate (TOPROL XL) 25 MG 24 hr tablet Take 1 tablet (25 mg total) by mouth daily. 90 tablet 3  . Multiple Vitamins-Minerals (MULTIVITAMIN PO) Take 1 tablet by mouth daily.     Marland Kitchen oxyCODONE (OXY IR/ROXICODONE) 5 MG immediate release tablet Take 5 mg by mouth every 4-6 hours PRN severe pain. 28 tablet 0  . pantoprazole (PROTONIX) 40 MG tablet TAKE 1 TABLET BY MOUTH EVERY DAY 90 tablet 3  . warfarin (COUMADIN) 5 MG tablet Take 1 tablet (5 mg total) by  mouth daily at 6 PM. Or as directed. 30 tablet 1   No current facility-administered medications for this visit.     Allergies:   Patient has no known allergies.    Social History:  The patient  reports that he quit smoking about 37 years ago. He has never used smokeless tobacco. He reports that he drinks alcohol. He reports that he does not use drugs.   Family History:  The patient's family history includes Cancer in his mother; Heart disease in his father.    ROS:  General:no colds or fevers, some weight loss Skin:no rashes or ulcers HEENT:no blurred vision, no congestion CV:see HPI PUL:see HPI GI:no diarrhea constipation or melena, no indigestion GU:no hematuria, no dysuria MS:no joint pain, no claudication Neuro:no syncope, no lightheadedness Endo:no diabetes, no thyroid disease  Wt Readings from Last 3 Encounters:  11/23/16 204 lb 6.4 oz (92.7 kg)  11/09/16 209 lb 9.6 oz (95.1 kg)  11/02/16 208 lb (94.3 kg)     PHYSICAL EXAM: VS:  BP 140/60   Pulse 99   Ht 6' (1.829 m)   Wt 204 lb 6.4 oz (92.7 kg)   BMI 27.72 kg/m  , BMI Body mass index is 27.72 kg/m. General:Pleasant affect, NAD Skin:Warm and dry, brisk capillary refill HEENT:normocephalic, sclera clear, mucus membranes moist Neck:supple, no JVD, no bruits  Heart:S1S2 RRR without murmur + click of valve, no gallup, rub or click, incision of chest healing well no drainage or redness Lungs:clear without rales, rhonchi, or wheezes [id number], non tender, + BS, do not palpate liver spleen or masses Ext:no lower ext edema, 2+ pedal pulses, 2+ radial pulses Neuro:alert and oriented X #, MAE, follows commands, + facial symmetry    EKG:  EKG is ordered today. The ekg ordered today demonstrates SR with PACs otherwise normal EKG.   Recent Labs: 10/30/2016: ALT 20 11/04/2016: Magnesium 2.2 11/06/2016: Hemoglobin 9.9; Platelets 105 11/09/2016: BUN 7; Creatinine, Ser 0.92; Potassium 3.6; Sodium 137    Lipid Panel     Component Value Date/Time   CHOL 135 09/24/2016 0847   TRIG 67.0 09/24/2016 0847   HDL 40.30 09/24/2016 0847   CHOLHDL 3 09/24/2016 0847   VLDL 13.4 09/24/2016 0847   LDLCALC 82 09/24/2016 0847       Other studies Reviewed: Additional studies/ records that were reviewed today include:  CARDIAC CATH 09/29/2016   Conclusion     The left ventricular ejection fraction is 55-65% by visual estimate. LV was visualized due to severe AI on supravalvular aortogram.  Hemodynamic findings consistent with mild pulmonary hypertension.  No significant CAD.  Prominent V waves consistent with severe mitral regurgitation.  No renal artery stenosis noted. No AAA.  There is severe (4+) aortic regurgitation.     abd aorta gram Thoracic Aortogram  Conclusion     The left ventricular ejection fraction is 55-65% by visual estimate. LV was visualized due to severe AI on supravalvular aortogram.  Hemodynamic findings consistent with mild pulmonary hypertension.  No significant CAD.  Prominent V waves consistent with severe mitral regurgitation.  No renal artery stenosis noted. No AAA.  There is severe (4+) aortic regurgitation.    Carotid dopplers: ------------------------------------------------------------------- Summary: Findings consistent with a low end 1- 39 percent stenosis involving the right internal carotid artery and the left internal carotid artery. Bilateral vertebral arteries are patent and antegrade.  TEE intra op: Conclusions   Result status: Final result   Left ventricle: Cavity is moderately dilated. End diastolic diameter of 6 cm. Concentric hypertrophy. 1.2cm inferior base thickness. LV systolic function is mildly to moderately reduced with an EF of 40-45%. There are no obvious wall motion abnormalities. No thrombus present. No mass present.  Septum: No Patent Foramen Ovale present.  Left atrium: Patent foramen ovale not present.  Left atrium: No  spontaneous echo contrast.  Aortic valve: The valve is bicuspid and has systolic doming. Moderate valve thickening present. Moderate valve calcification present. Mildly decreased leaflet separation. Mild stenosis. Severe regurgitation. Holodiastolic flow reversal in the descending thoracic aorta. No AV vegetation.  Aorta: The aortic root is dilated at the sinuses of Valsalva. The ascending aorta is dilated.  Mitral valve: The mitral annulus is dilated. The A2 segment is flail with connected ruptured chordae from the tip of the papillary muscle. No leaflet thickening and calcification present. Ruptured chordae causing anterior leaflet incompetence. Severe regurgitation. Flail portion of the anterior leaflet involving the middle segment.  Right ventricle:  Normal cavity size, wall thickness and ejection fraction.  Tricuspid valve: Trace regurgitation. The tricuspid valve regurgitation jet is central.  Mitral valve: Mild stenosis.  Aorta: Graft present.       ASSESSMENT AND PLAN:  1. Bicuspid aortic valve with AI and aortic root aneurysm with Bentall aortic root replacement On-X bileaflet mechanical valve synthetic root conduit   Will need dental prophylaxis  - pt would like to attend cardiac rehab after the holidays.  - keep appt with Dr. Roxy Manns and follow up with Dr. [name] in 6-8 weeks.    2. MV Repair for MVP  3. anticoaulation on coumadin for INR check on wed.   4. HTN controlled.   5. Anemia check CBC and BMP today post op  Current medicines are reviewed with the patient today.  The patient Has no concerns regarding medicines.  The following changes have been made:  See above Labs/ tests ordered today include:see above  Disposition:   FU:  see above  Signed, Cecilie Kicks, NP  11/23/2016 10:28 AM    [place] [address] [place], [place], [place], [place] Phone: [phone]; Fax: [phone]

## 2016-11-23 ENCOUNTER — Other Ambulatory Visit: Payer: Self-pay | Admitting: *Deleted

## 2016-11-23 ENCOUNTER — Ambulatory Visit (INDEPENDENT_AMBULATORY_CARE_PROVIDER_SITE_OTHER): Payer: PPO | Admitting: Cardiology

## 2016-11-23 ENCOUNTER — Encounter: Payer: Self-pay | Admitting: Cardiology

## 2016-11-23 VITALS — BP 140/60 | HR 99 | Ht 72.0 in | Wt 204.4 lb

## 2016-11-23 DIAGNOSIS — D649 Anemia, unspecified: Secondary | ICD-10-CM

## 2016-11-23 DIAGNOSIS — Z9889 Other specified postprocedural states: Secondary | ICD-10-CM | POA: Diagnosis not present

## 2016-11-23 DIAGNOSIS — Z954 Presence of other heart-valve replacement: Secondary | ICD-10-CM | POA: Diagnosis not present

## 2016-11-23 LAB — CBC WITH DIFFERENTIAL/PLATELET
BASOS ABS: 72 {cells}/uL (ref 0–200)
Basophils Relative: 1 %
EOS PCT: 4 %
Eosinophils Absolute: 288 cells/uL (ref 15–500)
HEMATOCRIT: 35 % — AB (ref 38.5–50.0)
Hemoglobin: 11.6 g/dL — ABNORMAL LOW (ref 13.2–17.1)
LYMPHS ABS: 2304 {cells}/uL (ref 850–3900)
Lymphocytes Relative: 32 %
MCH: 28.1 pg (ref 27.0–33.0)
MCHC: 33.1 g/dL (ref 32.0–36.0)
MCV: 84.7 fL (ref 80.0–100.0)
MONO ABS: 648 {cells}/uL (ref 200–950)
MPV: 9.7 fL (ref 7.5–12.5)
Monocytes Relative: 9 %
NEUTROS PCT: 54 %
Neutro Abs: 3888 cells/uL (ref 1500–7800)
Platelets: 410 10*3/uL — ABNORMAL HIGH (ref 140–400)
RBC: 4.13 MIL/uL — ABNORMAL LOW (ref 4.20–5.80)
RDW: 13.6 % (ref 11.0–15.0)
WBC: 7.2 10*3/uL (ref 3.8–10.8)

## 2016-11-23 LAB — BASIC METABOLIC PANEL WITH GFR
BUN: 13 mg/dL (ref 7–25)
CO2: 30 mmol/L (ref 20–31)
Calcium: 9.3 mg/dL (ref 8.6–10.3)
Chloride: 104 mmol/L (ref 98–110)
Creat: 0.86 mg/dL (ref 0.70–1.25)
GFR, Est Non African American: 89 mL/min (ref >=60)
GLUCOSE: 100 mg/dL — AB (ref 65–99)
POTASSIUM: 4.5 mmol/L (ref 3.5–5.3)
Sodium: 139 mmol/L (ref 135–146)

## 2016-11-23 NOTE — Patient Instructions (Signed)
Medication Instructions: Your physician recommends that you continue on your current medications as directed. Please refer to the Current Medication list given to you today.   Labwork: Your physician recommends that you return for lab work: CBC, BMET   Follow-Up: Your physician recommends that you schedule a follow-up appointment in: 6-8 weeks with Dr. [name].  If you need a refill on your cardiac medications before your next appointment, please call your pharmacy.

## 2016-11-23 NOTE — Patient Outreach (Signed)
Telephone assessment - initial. Was not able to reach pt but I did leave a voice mail and asked for a return call. Pt returned my call last week but I was not able to call back.  Deloria Lair [facility] [place] [phone]

## 2016-11-24 ENCOUNTER — Ambulatory Visit (INDEPENDENT_AMBULATORY_CARE_PROVIDER_SITE_OTHER): Payer: PPO | Admitting: Family Medicine

## 2016-11-24 ENCOUNTER — Encounter: Payer: Self-pay | Admitting: Family Medicine

## 2016-11-24 ENCOUNTER — Encounter: Payer: Self-pay | Admitting: *Deleted

## 2016-11-24 VITALS — BP 152/68 | HR 68 | Temp 97.8°F | Resp 16 | Ht 72.0 in | Wt 203.5 lb

## 2016-11-24 DIAGNOSIS — M791 Myalgia: Secondary | ICD-10-CM

## 2016-11-24 DIAGNOSIS — Z952 Presence of prosthetic heart valve: Secondary | ICD-10-CM | POA: Diagnosis not present

## 2016-11-24 DIAGNOSIS — Z9889 Other specified postprocedural states: Secondary | ICD-10-CM | POA: Diagnosis not present

## 2016-11-24 DIAGNOSIS — R6889 Other general symptoms and signs: Secondary | ICD-10-CM

## 2016-11-24 DIAGNOSIS — M7918 Myalgia, other site: Secondary | ICD-10-CM

## 2016-11-24 LAB — TSH: TSH: 1.71 u[IU]/mL (ref 0.35–4.50)

## 2016-11-24 NOTE — Patient Outreach (Signed)
I was finally able to speak with Kenneth Ryan today. He reports he and his wife have nearly daily MD appointments. He reports he is doing weell. He does not have home health and does not feel like he needs a visiting nurse. I advised I will be calling him weekly and can come to visit him if need be. He has all his meds. He does report his blood pressure has been a bit higher than usual for him. He has seen cardiology and his primary care provider.  We discussed basic guidelines for managing heart failure and daily monitoring he should be doing, We also discussed needs for him to follow a low salt diet.  I will call him again next week.  Patient was recently discharged from hospital and all medications have been reviewed.  [facility] CM Care Plan Problem One   Flowsheet Row Most Recent Value  Care Plan Problem One  CHF and CAD  Role Documenting the Problem One  Care Management Coordinator  Care Plan for Problem One  Active  [facility] Long Term Goal (31-90 days)  Pt will not readmit for any cardiac event in the next 31 days.  [facility] Long Term Goal Start Date  11/24/16  Interventions for Problem One Long Term Goal  Explained [facility] care management services and goal to teach self care and prevent hospitalization. Discussed CHF Action Plan.  [facility] CM Short Term Goal #1 (0-30 days)  Pt will be able to tell me about the CHF action plan by the end of 30 days.  [facility] CM Short Term Goal #1 Start Date  11/24/16  Interventions for Short Term Goal #1  I will provide pt HF education materials including a calendar with the action plan in it. Pt will be able to record his weights and BPs.  [facility] CM Short Term Goal #2 (0-30 days)  Pt will be able to demonstrate that he is recording his daily weights over the next month.  [facility] CM Short Term Goal #2 Start Date  11/24/16      Deloria Lair [facility] [place] [phone]

## 2016-11-24 NOTE — Progress Notes (Signed)
OFFICE VISIT  11/24/2016   CC:  Chief Complaint  Patient presents with  . Follow-up    Heart Surgery   HPI:    Patient is a 70 y.o. Caucasian male who presents for f/u recent cardiac surgery.  He has already had post-op outpatient f/u with his CV surgeon.  He had surgery to repair aortic root aneurism, to replace his bicuspid aortic valve, and to repair his mitral valve --all on 11/03/16. A repeat CBC at CV surgeon's showed that his Hb had risen to 11.6 from 9.9 post-op.  He will be going into cardiac rehab.  Has transient pains around shoulders and shoulder blades, short lived, goes away when he gets up and walks around a little.  He wonders if a small fatty tumor he feels in back area could be the cause.  Usually takes an oxycodone at the end of the day and this helps quell his back pain enough to get some sleep.  Having some random hot/sweat flashes, sometimes associated with hot surroundings but other times surroundings are not hot.  Feels no palpitations or heart racing.  Very mild disequilibrium feeling occasionally.  ROS: no CP, no SOB, no dizziness, no nosebleeds, no excessive bruising, no hematochezia, no gross hematuria.         Past Medical History:  Diagnosis Date  . 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 Aortic root aneurysm ([place])    Repaired 11/03/16.  . Basal cell carcinoma    nose  . Bicuspid aortic valve    Replaced with mechanical AV 11/03/16  . Chronic hoarseness    secondary to hx of laryngeal surgery  . GERD (gastroesophageal reflux disease)   . Headache   . Heart murmur   . History of tobacco abuse   . Hypertriglyceridemia   . Increased prostate specific antigen (PSA) velocity 09/2016   saw Dr. Jeffie Pollock 11/18/16: plan is to repeat PSA 6 wks and if still same/up then plan TRUS+ bx.  . 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  Marland Kitchen Lesion of vocal cord   . Mitral regurgitation    severe by TEE 08/2016:  MV repair done 11/03/16  . Mitral valve prolapse   . PONV (postoperative nausea and vomiting)    pt denies this 10/30/16  . 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 s/p Bentall procedure for aortic root aneurysm ([place]) 11/03/2016  . 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   Complex valvuloplasty including artificial Gore-tex neochord placement x6 and 28 mm Sorin Memo 3D ring annuloplasty    Past Surgical History:  Procedure Laterality Date  . BENTALL PROCEDURE N/A 11/03/2016   Procedure: BENTALL PROCEDURE AORTIC ROOT REPLACEMENT + mechanical AV;  Surgeon: Rexene Alberts, MD;  Location: [place];  Service: Open Heart Surgery;  Laterality: N/A;  . CARDIAC CATHETERIZATION N/A 09/29/2016   Clean coronaries, EF 60-65%.  Procedure: Right/Left Heart Cath and Coronary Angiography;  Surgeon: Jettie Booze, MD;  Location: [place] [facility];  Service: Cardiovascular;  Laterality: N/A;  . CHOLECYSTECTOMY  1980  . COLONOSCOPY  2003 and 2013   Both normal.  . DIRECT LARYNGOSCOPY Left 10/17/2015   Procedure: MICRO DIRECT  LARYNGOSCOPY WITH FROZEN SECTION;  Surgeon: Jodi Marble, MD;  Location: [place];  Service: ENT;  Laterality: Left;  . MITRAL VALVE REPAIR N/A 11/03/2016   Procedure: MITRAL VALVE REPAIR (MVR);  Surgeon: Rexene Alberts, MD;  Location: [place];  Service: Open Heart Surgery;  Laterality: N/A;  . PERIPHERAL VASCULAR CATHETERIZATION N/A 09/29/2016   Procedure: Abdominal Aortogram;  Surgeon: Jettie Booze, MD;  Location: [place] [facility];  Service: Cardiovascular;  Laterality: N/A;  . PERIPHERAL VASCULAR CATHETERIZATION N/A 09/29/2016   Procedure: Thoracic Aortogram;  Surgeon:  Jettie Booze, MD;  Location: [place] [facility];  Service: Cardiovascular;  Laterality: N/A;  . TEE WITHOUT CARDIOVERSION N/A 09/17/2016   Procedure: TRANSESOPHAGEAL ECHOCARDIOGRAM (TEE);  Surgeon: Jerline Pain, MD;  Location: [place];  Service: Cardiovascular;  Laterality: N/A;  . TEE WITHOUT CARDIOVERSION N/A 11/03/2016   Procedure: TRANSESOPHAGEAL ECHOCARDIOGRAM (TEE);  Surgeon: Rexene Alberts, MD;  Location: [place];  Service: Open Heart Surgery;  Laterality: N/A;  . TONSILECTOMY/ADENOIDECTOMY WITH MYRINGOTOMY  1960  . 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  Mod/severe AR w/functional bicuspid AV.  EF 45%.  . TRANSTHORACIC ECHOCARDIOGRAM  07/2015   Bicuspid aortic valve + mod mitral regurg  . Vocal cord biopsy  09/2015   No atypia or inflammation.  Some fibrous changes that are possibly from hx of radiation therapy.    Outpatient Medications Prior to Visit  Medication Sig Dispense Refill  . acetaminophen (TYLENOL) 500 MG tablet Take 500-1,000 mg by mouth every 6 (six) hours as needed for mild pain (depends on pain level if takes 500 mg or 1000 mg).     Marland Kitchen aspirin EC 81 MG EC tablet Take 1 tablet (81 mg total) by mouth daily.    . fenofibrate (TRICOR) 145 MG tablet TAKE 1 TABLET(145 MG) BY MOUTH DAILY 90 tablet 3  . metoprolol succinate (TOPROL XL) 25 MG 24 hr tablet Take 1 tablet (25 mg total) by mouth daily. 90 tablet 3  . Multiple Vitamins-Minerals (MULTIVITAMIN PO) Take 1 tablet by mouth daily.     Marland Kitchen oxyCODONE (OXY IR/ROXICODONE) 5 MG immediate release tablet Take 5 mg by mouth every 4-6 hours PRN severe pain. 28 tablet 0  . pantoprazole (PROTONIX) 40 MG tablet TAKE 1 TABLET BY MOUTH EVERY DAY 90 tablet 3  . warfarin (COUMADIN) 5 MG tablet Take 1 tablet (5 mg total) by mouth daily at 6 PM. Or as directed. 30 tablet 1   No facility-administered medications prior to visit.     No Known Allergies  ROS As per HPI  PE: Blood pressure  (!) 152/68, pulse 68, temperature 97.8 F (36.6 C), temperature source Oral, resp. rate 16, height 6' (1.829 m), weight 203 lb 8 oz (92.3 kg), SpO2 100 %. Gen: Alert, well appearing.  Patient is oriented to person, place, time, and situation. BACK: I cannot feel any lipomatous mass or any other mass in the soft tissues of the back.  He has no myofascial or bony tenderness of his back or shoulders.  He has a lipoma about quarter sized on L forearm and about golf ball sized on R abd wall. CV: RRR with frequent, regular, ectopic beats heard.  Mechanical S2.  No m/r/g. Chest is clear, no wheezing or rales. Normal symmetric air entry throughout both lung fields. No chest wall deformities or tenderness. EXT: no clubbing or cyanosis.  Trace bilat LL edema.  LABS:  Lab Results  Component  Value Date   TSH 3.53 01/22/2014   Lab Results  Component Value Date   WBC 7.2 11/23/2016   HGB 11.6 (L) 11/23/2016   HCT 35.0 (L) 11/23/2016   MCV 84.7 11/23/2016   PLT 410 (H) 11/23/2016   Lab Results  Component Value Date   CREATININE 0.86 11/23/2016   BUN 13 11/23/2016   NA 139 11/23/2016   K 4.5 11/23/2016   CL 104 11/23/2016   CO2 30 11/23/2016   Lab Results  Component Value Date   ALT 20 10/30/2016   AST 25 10/30/2016   ALKPHOS 40 10/30/2016   BILITOT 0.6 10/30/2016   Lab Results  Component Value Date   CHOL 135 09/24/2016   Lab Results  Component Value Date   HDL 40.30 09/24/2016   Lab Results  Component Value Date   LDLCALC 82 09/24/2016   Lab Results  Component Value Date   TRIG 67.0 09/24/2016   Lab Results  Component Value Date   CHOLHDL 3 09/24/2016   Lab Results  Component Value Date   PSA 4.17 (H) 09/24/2016   PSA 2.59 09/19/2015   PSA 2.16 01/22/2014   Lab Results  Component Value Date   HGBA1C 5.5 10/30/2016    IMPRESSION AND PLAN:  1) Bicuspid AV with severe stenosis and regurg, with MV regurg---now s/p repair of aortic root aneurism, has mechanical  prosthetic AV, and has had his MV repaired.  He is feeling good, without any cardiopulmonary symptoms.  He'll start cardiac rehab soon. PT/INR monitoring through cardiology office at this time.  2) Intermittent heat intolerance/hot flash: unclear etiology.  Check TSH today.  3) Lipomas: multiple, but I don't feel anything like this on his back that would be causing back pain.  He is having muscle pain in his mid and upper back.  Encouraged good posture, frequent stretching, heat application prn.  An After Visit Summary was printed and given to the patient.  FOLLOW UP: Return in about 9 months (around 08/25/2017) for annual CPE (fasting).  Signed:  Crissie Sickles, MD           11/24/2016

## 2016-11-24 NOTE — Progress Notes (Signed)
Pre visit review using our clinic review tool, if applicable. No additional management support is needed unless otherwise documented below in the visit note. 

## 2016-11-25 ENCOUNTER — Ambulatory Visit (INDEPENDENT_AMBULATORY_CARE_PROVIDER_SITE_OTHER): Payer: PPO | Admitting: *Deleted

## 2016-11-25 DIAGNOSIS — Z9889 Other specified postprocedural states: Secondary | ICD-10-CM

## 2016-11-25 DIAGNOSIS — Z5181 Encounter for therapeutic drug level monitoring: Secondary | ICD-10-CM

## 2016-11-25 DIAGNOSIS — Z954 Presence of other heart-valve replacement: Secondary | ICD-10-CM

## 2016-11-25 DIAGNOSIS — I7121 Aneurysm of the ascending aorta, without rupture: Secondary | ICD-10-CM

## 2016-11-25 DIAGNOSIS — I719 Aortic aneurysm of unspecified site, without rupture: Secondary | ICD-10-CM | POA: Diagnosis not present

## 2016-11-25 DIAGNOSIS — Q2543 Congenital aneurysm of aorta: Secondary | ICD-10-CM

## 2016-11-25 LAB — POCT INR: INR: 2.4

## 2016-11-30 ENCOUNTER — Other Ambulatory Visit: Payer: Self-pay | Admitting: *Deleted

## 2016-11-30 NOTE — Patient Outreach (Signed)
Transition of care call. Pt did not answer his phone. I left a message and requested a return call.  Deloria Lair [facility] [place] [phone]  Called pt a second time this eveing. I spoke with his wife who says he is not at home. I asked her to tell him I called and that I will try him again tomorrow.  Deloria Lair [facility] [place] [phone]

## 2016-12-02 ENCOUNTER — Ambulatory Visit (INDEPENDENT_AMBULATORY_CARE_PROVIDER_SITE_OTHER): Payer: PPO | Admitting: Pharmacist

## 2016-12-02 ENCOUNTER — Other Ambulatory Visit: Payer: Self-pay | Admitting: *Deleted

## 2016-12-02 DIAGNOSIS — Z954 Presence of other heart-valve replacement: Secondary | ICD-10-CM

## 2016-12-02 DIAGNOSIS — Z9889 Other specified postprocedural states: Secondary | ICD-10-CM | POA: Diagnosis not present

## 2016-12-02 DIAGNOSIS — I7121 Aneurysm of the ascending aorta, without rupture: Secondary | ICD-10-CM

## 2016-12-02 DIAGNOSIS — Z5181 Encounter for therapeutic drug level monitoring: Secondary | ICD-10-CM | POA: Diagnosis not present

## 2016-12-02 DIAGNOSIS — I719 Aortic aneurysm of unspecified site, without rupture: Secondary | ICD-10-CM

## 2016-12-02 LAB — POCT INR: INR: 3.3

## 2016-12-02 NOTE — Patient Outreach (Signed)
Pt returned my call to report he is doing very well. He is not having any post operative problems. He feels great. He is able to do what he wants. He is following a healthy heart diet, taking his meds and attending all his MD appts. I will call him again in a week.  Deloria Lair [facility] [place] [phone]

## 2016-12-04 ENCOUNTER — Other Ambulatory Visit: Payer: Self-pay | Admitting: Thoracic Surgery (Cardiothoracic Vascular Surgery)

## 2016-12-04 DIAGNOSIS — Z9889 Other specified postprocedural states: Secondary | ICD-10-CM

## 2016-12-07 ENCOUNTER — Ambulatory Visit
Admission: RE | Admit: 2016-12-07 | Discharge: 2016-12-07 | Disposition: A | Discharge status: Home / Self Care | Payer: PPO | Source: Ambulatory Visit | Attending: Thoracic Surgery (Cardiothoracic Vascular Surgery) | Admitting: Thoracic Surgery (Cardiothoracic Vascular Surgery)

## 2016-12-07 ENCOUNTER — Encounter: Payer: Self-pay | Admitting: Thoracic Surgery (Cardiothoracic Vascular Surgery)

## 2016-12-07 ENCOUNTER — Ambulatory Visit (INDEPENDENT_AMBULATORY_CARE_PROVIDER_SITE_OTHER): Payer: Self-pay | Admitting: Physician Assistant

## 2016-12-07 VITALS — BP 115/73 | HR 86 | Resp 20 | Ht 72.0 in | Wt 203.0 lb

## 2016-12-07 DIAGNOSIS — Q231 Congenital insufficiency of aortic valve: Secondary | ICD-10-CM

## 2016-12-07 DIAGNOSIS — Z9889 Other specified postprocedural states: Secondary | ICD-10-CM

## 2016-12-07 DIAGNOSIS — J9811 Atelectasis: Secondary | ICD-10-CM | POA: Diagnosis not present

## 2016-12-07 NOTE — Progress Notes (Signed)
Kenneth Ryan is a 70 y.o. male patient.  No diagnosis found.  Past Medical History:  Diagnosis Date  . 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  . Basal cell carcinoma    nose  . Bicuspid aortic valve    Replaced with mechanical AV 11/03/16  . Chronic hoarseness    secondary to hx of laryngeal surgery  . GERD (gastroesophageal reflux disease)   . Headache   . Heart murmur   . History of tobacco abuse   . Hypertriglyceridemia   . Increased prostate specific antigen (PSA) velocity 09/2016   saw Dr. Jeffie Pollock 11/18/16: plan is to repeat PSA 6 wks and if still same/up then plan TRUS+ bx.  . Laryngeal cancer [facility] 2002   Surgery and radiation---He had post treatment surveillance x 12 yrs and was officially "released" from f/u in summer 2014.  Marland Kitchen Lesion of vocal cord   . Mitral regurgitation    severe by TEE 08/2016:  MV repair done 11/03/16  . Mitral valve prolapse   . PONV (postoperative nausea and vomiting)    pt denies this 10/30/16  . 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 s/p Bentall procedure for aortic root aneurysm ([place]) 11/03/2016  . 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   Complex valvuloplasty including artificial Gore-tex neochord placement x6 and 28 mm Sorin Memo 3D ring annuloplasty   No past surgical history pertinent negatives on file. Scheduled Meds: Continuous Infusions: PRN Meds:  No Known Allergies Active Problems:   * No active hospital problems. *  Blood pressure 115/73, pulse 86, resp. rate 20, height 6' (1.829 m), weight 203 lb (92.1 kg), SpO2 98 %.  Subjective: Feels good today. He has returned to the office for his one month follow-up s/p Bentall Aortic Root  Replacement utilizing a On-X bileaflet mechanical valve synthetic root conduit and mitral valve repair.   Objective  Gen: Alert and oriented. No distress. Well appearing Cor: RRR, no murmur Pulm: CTA bilaterally in all fields Abd: normal bowel sounds without mass Extremities: no edema Wound: clean and dry without drainage  Assessment & Plan  Mr. Onderko presented for his follow-up appointment. He is overall doing very well. He is not taking any pain medication, but occasionally takes a tylenol at night. He is getting around and doing his daily walks. He is maintaining sternal precautions. His sternum is stable to palpation today. We reviewed options for outpatient cardiac rehab. He is interested  in the program here at [facility], therefore I placed a referral. He also was told he may start driving. He is to drive with someone in the car the first few times and only drive during the day until comfortable. He was educated on his coumadin. He has home health taking his INR and his last one was 3.3. I reminded him that with his valve type his INR should be between 2.0-2.5. I have written this in the instructions for him to show home health and cardiology. His next INR check is Friday. He is taking 5.0mg  daily. He had no other questions at this time and was encouraged to reach out if any came up. He is to follow-up with Dr. Roxy Manns in 2 months.   Elgie Collard 12/07/2016

## 2016-12-07 NOTE — Patient Instructions (Addendum)
You may return to driving an automobile as long as you are no longer requiring oral narcotic pain relievers during the daytime.  It would be wise to start driving only short distances during the daylight and gradually increase from there as you feel comfortable.  Make every effort to stay physically active, get some type of exercise on a regular basis, and stick to a "heart healthy diet".  The long term benefits for regular exercise and a healthy diet are critically important to your overall health and wellbeing.  You are encouraged to enroll and participate in the outpatient cardiac rehab program beginning as soon as practical.  Endocarditis is a potentially serious infection of heart valves or inside lining of the heart.  It occurs more commonly in patients with diseased heart valves (such as patient's with aortic or mitral valve disease) and in patients who have undergone heart valve repair or replacement.  Certain surgical and dental procedures may put you at risk, such as dental cleaning, other dental procedures, or any surgery involving the respiratory, urinary, gastrointestinal tract, gallbladder or prostate gland.   To minimize your chances for develooping endocarditis, maintain good oral health and seek prompt medical attention for any infections involving the mouth, teeth, gums, skin or urinary tract.    Always notify your doctor or dentist about your underlying heart valve condition before having any invasive procedures. You will need to take antibiotics before certain procedures, including all routine dental cleanings or other dental procedures.  Your cardiologist or dentist should prescribe these antibiotics for you to be taken ahead of time.   Continue for regular INR checks for your coumadin. Your INR goal is 2.0-2.5.

## 2016-12-09 ENCOUNTER — Other Ambulatory Visit: Payer: Self-pay | Admitting: *Deleted

## 2016-12-10 NOTE — Patient Outreach (Signed)
Transtion of care call. Mr. Kenneth Ryan reports he is doing very well. No symptoms of any kind which may indicate a problem. He has my number and is willing to call me if he has any problem to prevent readmission. I will call him again next week.  Deloria Lair [facility] [place] [phone]

## 2016-12-11 ENCOUNTER — Ambulatory Visit (INDEPENDENT_AMBULATORY_CARE_PROVIDER_SITE_OTHER): Payer: PPO | Admitting: Pharmacist

## 2016-12-11 DIAGNOSIS — I7121 Aneurysm of the ascending aorta, without rupture: Secondary | ICD-10-CM

## 2016-12-11 DIAGNOSIS — Z954 Presence of other heart-valve replacement: Secondary | ICD-10-CM | POA: Diagnosis not present

## 2016-12-11 DIAGNOSIS — Z9889 Other specified postprocedural states: Secondary | ICD-10-CM

## 2016-12-11 DIAGNOSIS — I719 Aortic aneurysm of unspecified site, without rupture: Secondary | ICD-10-CM

## 2016-12-11 DIAGNOSIS — Z5181 Encounter for therapeutic drug level monitoring: Secondary | ICD-10-CM

## 2016-12-11 LAB — POCT INR: INR: 2.7

## 2016-12-12 ENCOUNTER — Other Ambulatory Visit: Payer: Self-pay | Admitting: Family Medicine

## 2016-12-15 NOTE — Telephone Encounter (Signed)
RF request for fenofibrate LOV: 11/24/16 Next ov: 09/20/17 Last written: 10/16/15 #90 w/ 3RF

## 2016-12-16 ENCOUNTER — Other Ambulatory Visit: Payer: Self-pay | Admitting: *Deleted

## 2016-12-16 NOTE — Patient Outreach (Signed)
Mr. Sidener reports he is doing well and had a good Christmas. He is weighing every day his wt range has been from 197-202. He is following the HF Action plan and has not needed to call for being in the [place]. He reports he is following a low sodium diet.   I have asked if I may send him some Emmi educational programs and he has consented. I have sent him the following:   Diet: Healthy Grocery Shopping  Low Sodium Diet  He is to review these by the end of January 2018.  Deloria Lair [facility] [place] [phone]

## 2016-12-21 DIAGNOSIS — R7301 Impaired fasting glucose: Secondary | ICD-10-CM

## 2016-12-21 DIAGNOSIS — I428 Other cardiomyopathies: Secondary | ICD-10-CM

## 2016-12-21 HISTORY — DX: Impaired fasting glucose: R73.01

## 2016-12-21 HISTORY — DX: Other cardiomyopathies: I42.8

## 2016-12-23 ENCOUNTER — Encounter: Payer: Self-pay | Admitting: *Deleted

## 2016-12-23 ENCOUNTER — Telehealth: Payer: Self-pay | Admitting: Family Medicine

## 2016-12-23 ENCOUNTER — Other Ambulatory Visit: Payer: Self-pay | Admitting: *Deleted

## 2016-12-23 NOTE — Patient Outreach (Signed)
Transition of care call - second month. Pt is doing well. He reports his CHF is stable (wt, no SOB, no edema). He admits he doesn't recall signing a consent form so I will send that to him today. Also, we discussed his Advanced Directives. I have suggested he discuss this topic further with his wife and I am going to send him a MOST form to post in the home.   I have advised him I will be calling him in 2 weeks and then again in 4 and if all is well I will close his case at that time.  Deloria Lair [facility] [place] [phone]

## 2016-12-23 NOTE — Telephone Encounter (Signed)
Done

## 2016-12-23 NOTE — Telephone Encounter (Signed)
Patient requests his pharmacy to be changed to [facility] due to insurance preferred provider.

## 2016-12-25 ENCOUNTER — Ambulatory Visit (INDEPENDENT_AMBULATORY_CARE_PROVIDER_SITE_OTHER): Payer: PPO

## 2016-12-25 DIAGNOSIS — Z954 Presence of other heart-valve replacement: Secondary | ICD-10-CM

## 2016-12-25 DIAGNOSIS — Z9889 Other specified postprocedural states: Secondary | ICD-10-CM | POA: Diagnosis not present

## 2016-12-25 DIAGNOSIS — Z5181 Encounter for therapeutic drug level monitoring: Secondary | ICD-10-CM

## 2016-12-25 DIAGNOSIS — I7121 Aneurysm of the ascending aorta, without rupture: Secondary | ICD-10-CM

## 2016-12-25 DIAGNOSIS — I719 Aortic aneurysm of unspecified site, without rupture: Secondary | ICD-10-CM | POA: Diagnosis not present

## 2016-12-25 LAB — POCT INR: INR: 2.7

## 2016-12-29 ENCOUNTER — Other Ambulatory Visit: Payer: Self-pay | Admitting: Physician Assistant

## 2016-12-30 ENCOUNTER — Ambulatory Visit: Payer: PPO | Admitting: *Deleted

## 2017-01-06 ENCOUNTER — Ambulatory Visit: Payer: PPO | Admitting: Cardiology

## 2017-01-06 ENCOUNTER — Other Ambulatory Visit: Payer: Self-pay | Admitting: *Deleted

## 2017-01-06 NOTE — Patient Outreach (Signed)
Transition of care call. No one answered my call today. I left a message and requested a return call.  Deloria Lair [facility] [place] [phone]

## 2017-01-08 ENCOUNTER — Ambulatory Visit (INDEPENDENT_AMBULATORY_CARE_PROVIDER_SITE_OTHER): Payer: PPO | Admitting: *Deleted

## 2017-01-08 DIAGNOSIS — Z5181 Encounter for therapeutic drug level monitoring: Secondary | ICD-10-CM

## 2017-01-08 DIAGNOSIS — I719 Aortic aneurysm of unspecified site, without rupture: Secondary | ICD-10-CM | POA: Diagnosis not present

## 2017-01-08 DIAGNOSIS — Z954 Presence of other heart-valve replacement: Secondary | ICD-10-CM | POA: Diagnosis not present

## 2017-01-08 DIAGNOSIS — Z9889 Other specified postprocedural states: Secondary | ICD-10-CM

## 2017-01-08 DIAGNOSIS — Q2543 Congenital aneurysm of aorta: Secondary | ICD-10-CM

## 2017-01-08 DIAGNOSIS — I7121 Aneurysm of the ascending aorta, without rupture: Secondary | ICD-10-CM

## 2017-01-08 LAB — POCT INR: INR: 1.7

## 2017-01-11 ENCOUNTER — Other Ambulatory Visit: Payer: Self-pay | Admitting: *Deleted

## 2017-01-11 MED ORDER — WARFARIN SODIUM 5 MG PO TABS: ORAL_TABLET | ORAL | 3 refills | Status: DC

## 2017-01-13 ENCOUNTER — Other Ambulatory Visit: Payer: Self-pay | Admitting: *Deleted

## 2017-01-13 NOTE — Patient Outreach (Signed)
Telephone outreach. Pt did not return my call from last week so I am following up.  Pt did not answer the call. I left a message and requested a return call.  Deloria Lair [facility] [place] [phone]

## 2017-01-15 ENCOUNTER — Encounter: Payer: Self-pay | Admitting: *Deleted

## 2017-01-18 ENCOUNTER — Ambulatory Visit (INDEPENDENT_AMBULATORY_CARE_PROVIDER_SITE_OTHER): Payer: PPO | Admitting: Cardiology

## 2017-01-18 ENCOUNTER — Encounter: Payer: Self-pay | Admitting: Cardiology

## 2017-01-18 ENCOUNTER — Ambulatory Visit (INDEPENDENT_AMBULATORY_CARE_PROVIDER_SITE_OTHER): Payer: PPO | Admitting: *Deleted

## 2017-01-18 VITALS — BP 142/84 | HR 70 | Ht 72.0 in | Wt 210.1 lb

## 2017-01-18 DIAGNOSIS — Z5181 Encounter for therapeutic drug level monitoring: Secondary | ICD-10-CM | POA: Diagnosis not present

## 2017-01-18 DIAGNOSIS — I719 Aortic aneurysm of unspecified site, without rupture: Secondary | ICD-10-CM | POA: Diagnosis not present

## 2017-01-18 DIAGNOSIS — I77810 Thoracic aortic ectasia: Secondary | ICD-10-CM | POA: Diagnosis not present

## 2017-01-18 DIAGNOSIS — Z9889 Other specified postprocedural states: Secondary | ICD-10-CM

## 2017-01-18 DIAGNOSIS — Z954 Presence of other heart-valve replacement: Secondary | ICD-10-CM

## 2017-01-18 DIAGNOSIS — I7121 Aneurysm of the ascending aorta, without rupture: Secondary | ICD-10-CM

## 2017-01-18 LAB — POCT INR: INR: 2.1

## 2017-01-18 NOTE — Progress Notes (Addendum)
Cardiology Office Note    Date:  01/18/2017   ID:  Kenneth Ryan, DOB 12/27/1945, MRN [id number]  PCP:  Kenneth Sou, MD  Cardiologist:   Kenneth Furbish, MD     History of Present Illness:  Kenneth Ryan is a 71 y.o. male with mechanical aortic valve, aortic root replacement, Bentall, and mitral valve repair on chronic anticoagulation secondary to congenital bicuspid aortic valve.   No CAD, EF 55%, severe AR and severe MR prior to procedure on 11/03/16.  He feels well. Only mild loss of the skeletal chest discomfort postoperatively. Mild dyspnea on exertion pills when walking. No sicca be, no bleeding, no orthopnea. Understands dental prophylaxis. Eager to start cardiac rehabilitation.  Past Medical History:  Diagnosis Date  . 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 Aortic root aneurysm (Kenneth Ryan)    Repaired 11/03/16.  . Basal cell carcinoma    nose  . Bicuspid aortic valve    Replaced with mechanical AV 11/03/16  . Chronic hoarseness    secondary to hx of laryngeal surgery  . GERD (gastroesophageal reflux disease)   . Headache   . Heart murmur   . History of tobacco abuse   . Hypertriglyceridemia   . Increased prostate specific antigen (PSA) velocity 09/2016   saw Dr. Jeffie Ryan 11/18/16: plan is to repeat PSA 6 wks and if still same/up then plan TRUS+ bx.  . Laryngeal cancer [facility]) 2002   Surgery and radiation---He had post treatment surveillance x 12 yrs and was officially "released" from f/u in summer 2014.  Marland Kitchen Lesion of vocal cord   . Mitral regurgitation    severe by TEE 08/2016:  MV repair done 11/03/16  . Mitral valve prolapse   . PONV (postoperative nausea and vomiting)    pt denies this 10/30/16  . S/P Bentall aortic root replacement with bileaflet mechanical prosthetic valve conduit 11/03/2016   Size 27/29 mm On-X bileaflet mechanical valve and synthetic root conduit with reimplantation of left main and right  coronary arteries.  Per CV surg, INR goal for this valve is 2-2.5.  Marland Kitchen s/p Bentall procedure for aortic root aneurysm ([place]) 11/03/2016  . 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   Complex valvuloplasty including artificial Gore-tex neochord placement x6 and 28 mm Sorin Memo 3D ring annuloplasty    Past Surgical History:  Procedure Laterality Date  . BENTALL PROCEDURE N/A 11/03/2016   Procedure: BENTALL PROCEDURE AORTIC ROOT REPLACEMENT + mechanical AV;  Surgeon: Kenneth Alberts, MD;  Location: [place];  Service: Open Heart Surgery;  Laterality: N/A;  . CARDIAC CATHETERIZATION N/A 09/29/2016   Clean coronaries, EF 60-65%.  Procedure: Right/Left Heart Cath and Coronary Angiography;  Surgeon: Kenneth Booze, MD;  Location: [place] CV LAB;  Service: Cardiovascular;  Laterality: N/A;  . CHOLECYSTECTOMY  1980  . COLONOSCOPY  2003 and 2013   Both normal.  . DIRECT LARYNGOSCOPY Left 10/17/2015   Procedure: MICRO DIRECT LARYNGOSCOPY WITH FROZEN SECTION;  Surgeon: Kenneth Marble, MD;  Location: [place];  Service: ENT;  Laterality: Left;  . MITRAL VALVE REPAIR N/A 11/03/2016   Procedure: MITRAL VALVE REPAIR (MVR);  Surgeon: Kenneth Alberts, MD;  Location: [place];  Service: Open Heart Surgery;  Laterality: N/A;  . PERIPHERAL VASCULAR CATHETERIZATION N/A 09/29/2016   Procedure: Abdominal Aortogram;  Surgeon: Kenneth Booze, MD;  Location: [place] CV LAB;  Service: Cardiovascular;  Laterality: N/A;  . PERIPHERAL VASCULAR CATHETERIZATION N/A 09/29/2016  Procedure: Thoracic Aortogram;  Surgeon: Kenneth Booze, MD;  Location: [place] CV LAB;  Service: Cardiovascular;  Laterality: N/A;  . TEE WITHOUT CARDIOVERSION N/A 09/17/2016   Procedure: TRANSESOPHAGEAL ECHOCARDIOGRAM (TEE);  Surgeon: Kenneth Pain, MD;  Location: [place];  Service: Cardiovascular;  Laterality: N/A;  . TEE WITHOUT CARDIOVERSION N/A 11/03/2016   Procedure: TRANSESOPHAGEAL ECHOCARDIOGRAM (TEE);  Surgeon: Kenneth Alberts, MD;  Location: [place];  Service: Open Heart Surgery;  Laterality: N/A;  . TONSILECTOMY/ADENOIDECTOMY WITH MYRINGOTOMY  1960  . 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  Mod/severe AR w/functional bicuspid AV.  EF 45%.  . TRANSTHORACIC ECHOCARDIOGRAM  07/2015   Bicuspid aortic valve + mod mitral regurg  . Vocal cord biopsy  09/2015   No atypia or inflammation.  Some fibrous changes that are possibly from hx of radiation therapy.    Current Medications: Outpatient Medications Prior to Visit  Medication Sig Dispense Refill  . acetaminophen (TYLENOL) 500 MG tablet Take 500-1,000 mg by mouth every 6 (six) hours as needed for mild Ryan (depends on Ryan level if takes 500 mg or 1000 mg).     Marland Kitchen aspirin EC 81 MG EC tablet Take 1 tablet (81 mg total) by mouth daily.    . fenofibrate (TRICOR) 145 MG tablet TAKE 1 TABLET(145 MG) BY MOUTH DAILY 90 tablet 3  . metoprolol succinate (TOPROL XL) 25 MG 24 hr tablet Take 1 tablet (25 mg total) by mouth daily. 90 tablet 3  . Multiple Vitamins-Minerals (MULTIVITAMIN PO) Take 1 tablet by mouth daily.     . pantoprazole (PROTONIX) 40 MG tablet TAKE 1 TABLET BY MOUTH EVERY DAY 90 tablet 3  . warfarin (COUMADIN) 5 MG tablet Take as directed by [facility] 30 tablet 3   No facility-administered medications prior to visit.      Allergies:   Patient has no known allergies.   Social History   Social History  . Marital status: Married    Spouse name: N/A  . Number of children: N/A  . Years of education: N/A   Social History Main Topics  . Smoking status: Former Smoker    Quit date: 07/22/1979  . Smokeless tobacco: Never Used  . Alcohol use Yes     Comment: rare  . Drug use: No  . Sexual activity: Not Asked   Other Topics Concern  . None   Social History Narrative   Married, one son deceased Apr 15, 2011.   Relocated from [place] to [facility] 06/2013 to be near grandchildren.   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   Smoker  20 pack-yr hx: quit about 1980.   Alcohol: 3 bottles of beer per week avg.     Drugs: none              Family History:  The patient's family history includes Breast cancer in his mother; Heart disease in his father.   ROS:   Please see the history of present illness.    ROS All other systems reviewed and are negative.   PHYSICAL EXAM:   VS:  BP (!) 142/84   Pulse 70   Ht 6' (1.829 m)   Wt 210 lb 1.9 oz (95.3 kg)   BMI 28.50 kg/m    GEN: Well nourished, well developed, in no acute distress  HEENT: normal  Neck: no JVD, carotid bruits, or masses Cardiac: RRR; sharp s2, no murmurs, rubs, or gallops,no edema  Respiratory:  clear to auscultation bilaterally, normal  work of breathing GI: soft, nontender, nondistended, + BS MS: no deformity or atrophy  Skin: warm and dry, no rash Neuro:  Alert and Oriented x 3, Strength and sensation are intact Psych: euthymic mood, full affect  Wt Readings from Last 3 Encounters:  01/18/17 210 lb 1.9 oz (95.3 kg)  12/07/16 203 lb (92.1 kg)  11/24/16 203 lb 8 oz (92.3 kg)      Studies/Labs Reviewed:   EKG:  Prior, NSR with PAC, personally viewed.  Recent Labs: 10/30/2016: ALT 20 11/04/2016: Magnesium 2.2 11/23/2016: BUN 13; Creat 0.86; Hemoglobin 11.6; Platelets 410; Potassium 4.5; Sodium 139 11/24/2016: TSH 1.71   Lipid Panel    Component Value Date/Time   CHOL 135 09/24/2016 0847   TRIG 67.0 09/24/2016 0847   HDL 40.30 09/24/2016 0847   CHOLHDL 3 09/24/2016 0847   VLDL 13.4 09/24/2016 0847   LDLCALC 82 09/24/2016 0847    Additional studies/ records that were reviewed today include:  Hospital records, cath, labs, ecg echo reviewed.     ASSESSMENT:    1. S/P aortic valve replacement with metallic valve   2. S/P mitral valve repair   3. Dilated aortic root (HCC)      PLAN:  In order of problems listed above:  Bentall, mechanical aortic valve  - cardiac rehab-We will initiate referral  - dental proph  Mitral  valve repair  - same as above  Chronic anticoagulation  - INR 2-3, mechanical AVR.   - checked here. His are normal when from 2.7-1.7, he thinks it may of been related to stopping his Protonix transiently.?.  Essential hypertension  - controlled at home. Today a little bit high. Usually in the 120s at home. He will monitor.  Non ischemic cardiomyopathy, dilated cardiomyopathy  - Toprol  - EF reduction is common post MV repair, however his seems lower than expected  - Will add ACE-I (Addendum 02/02/17 ECHO with EF 35%)  Medication Adjustments/Labs and Tests Ordered: Current medicines are reviewed at length with the patient today.  Concerns regarding medicines are outlined above.  Medication changes, Labs and Tests ordered today are listed in the Patient Instructions below. Patient Instructions  Medication Instructions:  The current medical regimen is effective;  continue present plan and medications.  Testing/Procedures: Your physician has requested that you have an echocardiogram. Echocardiography is a painless test that uses sound waves to create images of your heart. It provides your doctor with information about the size and shape of your heart and how well your heart's chambers and valves are working. This procedure takes approximately one hour. There are no restrictions for this procedure.  Follow-Up: Follow up in 6 months with Dr. [name].  You will receive a letter in the mail 2 months before you are due.  Please call us when you receive this letter to schedule your follow up appointment.  If you need a refill on your cardiac medications before your next appointment, please call your pharmacy.  Thank you for choosing [facility]!!        Signed, Kenneth Furbish, MD  01/18/2017 9:08 AM    [facility] [place], [place], [place]  [address] Phone: [phone]; Fax: [phone]

## 2017-01-18 NOTE — Patient Instructions (Signed)
Medication Instructions:  The current medical regimen is effective;  continue present plan and medications.  Testing/Procedures: Your physician has requested that you have an echocardiogram. Echocardiography is a painless test that uses sound waves to create images of your heart. It provides your doctor with information about the size and shape of your heart and how well your heart's chambers and valves are working. This procedure takes approximately one hour. There are no restrictions for this procedure.  Follow-Up: Follow up in 6 months with Dr. Skains.  You will receive a letter in the mail 2 months before you are due.  Please call us when you receive this letter to schedule your follow up appointment.  If you need a refill on your cardiac medications before your next appointment, please call your pharmacy.  Thank you for choosing [facility]!!       

## 2017-01-20 ENCOUNTER — Ambulatory Visit: Payer: Self-pay | Admitting: *Deleted

## 2017-01-28 ENCOUNTER — Ambulatory Visit (INDEPENDENT_AMBULATORY_CARE_PROVIDER_SITE_OTHER): Payer: PPO | Admitting: Family Medicine

## 2017-01-28 ENCOUNTER — Encounter: Payer: Self-pay | Admitting: Family Medicine

## 2017-01-28 VITALS — BP 109/69 | HR 82 | Temp 98.4°F | Resp 16 | Ht 72.0 in | Wt 210.2 lb

## 2017-01-28 DIAGNOSIS — J209 Acute bronchitis, unspecified: Secondary | ICD-10-CM | POA: Diagnosis not present

## 2017-01-28 NOTE — Progress Notes (Signed)
Pre visit review using our clinic review tool, if applicable. No additional management support is needed unless otherwise documented below in the visit note. 

## 2017-01-28 NOTE — Progress Notes (Signed)
OFFICE VISIT  01/28/2017   CC:  Chief Complaint  Patient presents with  . Cough    productive x 3 days   HPI:    Patient is a 71 y.o. Caucasian male who presents for respiratory complaints. Onset 4 days ago: throat irritation, cough, some nasal congestion and PND.  Some mucous production with coughing.  Says he heard wheezing.  No SOB.  No fever.  Throat not sore.  Mild HA.  Slight achiness of the body at worst.   Feels better today.  Mucinex DM being used.   Past Medical History:  Diagnosis Date  . 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  . Basal cell carcinoma    nose  . Chronic hoarseness    secondary to hx of laryngeal surgery  . GERD (gastroesophageal reflux disease)   . Headache   . Heart murmur   . History of tobacco abuse   . Hypertriglyceridemia   . Increased prostate specific antigen (PSA) velocity 09/2016   saw Dr. Jeffie Pollock 11/18/16: plan is to repeat PSA 6 wks and if still same/up then plan TRUS+ bx.  . Laryngeal cancer [facility]) 2002   Surgery and radiation---He had post treatment surveillance x 12 yrs and was officially "released" from f/u in summer 2014.  Marland Kitchen Lesion of vocal cord   . Mitral regurgitation    severe by TEE 08/2016:  MV repair done 11/03/16  . Mitral valve prolapse   . 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  Per CV surg, INR goal for this valve is 2-2.5.  Marland Kitchen s/p Bentall procedure for aortic root aneurysm ([place]) 11/03/2016  . 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   Complex valvuloplasty including artificial Gore-tex neochord placement x6 and 28 mm Sorin Memo 3D ring annuloplasty    Past Surgical History:  Procedure Laterality Date  . BENTALL PROCEDURE N/A 11/03/2016    Procedure: BENTALL PROCEDURE AORTIC ROOT REPLACEMENT + mechanical AV;  Surgeon: Rexene Alberts, MD;  Location: [place];  Service: Open Heart Surgery;  Laterality: N/A;  . CARDIAC CATHETERIZATION N/A 09/29/2016   Clean coronaries, EF 60-65%.  Procedure: Right/Left Heart Cath and Coronary Angiography;  Surgeon: Jettie Booze, MD;  Location: [place] [facility];  Service: Cardiovascular;  Laterality: N/A;  . CHOLECYSTECTOMY  1980  . COLONOSCOPY  2003 and 2013   Both normal.  . DIRECT LARYNGOSCOPY Left 10/17/2015   Procedure: MICRO DIRECT LARYNGOSCOPY WITH FROZEN SECTION;  Surgeon: Jodi Marble, MD;  Location: [place];  Service: ENT;  Laterality: Left;  . MITRAL VALVE REPAIR N/A 11/03/2016   Procedure: MITRAL VALVE REPAIR (MVR);  Surgeon: Rexene Alberts, MD;  Location: [place];  Service: Open Heart Surgery;  Laterality: N/A;  . PERIPHERAL VASCULAR CATHETERIZATION N/A 09/29/2016   Procedure: Abdominal Aortogram;  Surgeon: Jettie Booze, MD;  Location: [place] [facility];  Service: Cardiovascular;  Laterality: N/A;  . PERIPHERAL VASCULAR CATHETERIZATION N/A 09/29/2016   Procedure: Thoracic Aortogram;  Surgeon: Jettie Booze, MD;  Location: [place] [facility];  Service: Cardiovascular;  Laterality: N/A;  . TEE WITHOUT CARDIOVERSION N/A 09/17/2016   Procedure: TRANSESOPHAGEAL ECHOCARDIOGRAM (TEE);  Surgeon: Jerline Pain, MD;  Location: [place];  Service: Cardiovascular;  Laterality: N/A;  .  TEE WITHOUT CARDIOVERSION N/A 11/03/2016   Procedure: TRANSESOPHAGEAL ECHOCARDIOGRAM (TEE);  Surgeon: Rexene Alberts, MD;  Location: [place];  Service: Open Heart Surgery;  Laterality: N/A;  . TONSILECTOMY/ADENOIDECTOMY WITH MYRINGOTOMY  1960  . 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  Mod/severe AR w/functional bicuspid AV.  EF 45%.  . TRANSTHORACIC ECHOCARDIOGRAM  07/2015   Bicuspid aortic valve + mod mitral regurg  . Vocal cord biopsy  09/2015   No  atypia or inflammation.  Some fibrous changes that are possibly from hx of radiation therapy.    Outpatient Medications Prior to Visit  Medication Sig Dispense Refill  . acetaminophen (TYLENOL) 500 MG tablet Take 500-1,000 mg by mouth every 6 (six) hours as needed for mild pain (depends on pain level if takes 500 mg or 1000 mg).     Marland Kitchen aspirin EC 81 MG EC tablet Take 1 tablet (81 mg total) by mouth daily.    . fenofibrate (TRICOR) 145 MG tablet TAKE 1 TABLET(145 MG) BY MOUTH DAILY 90 tablet 3  . metoprolol succinate (TOPROL XL) 25 MG 24 hr tablet Take 1 tablet (25 mg total) by mouth daily. 90 tablet 3  . Multiple Vitamins-Minerals (MULTIVITAMIN PO) Take 1 tablet by mouth daily.     . pantoprazole (PROTONIX) 40 MG tablet TAKE 1 TABLET BY MOUTH EVERY DAY 90 tablet 3  . warfarin (COUMADIN) 5 MG tablet Take as directed by [facility] 30 tablet 3   No facility-administered medications prior to visit.     No Known Allergies  ROS As per HPI  PE: Blood pressure 109/69, pulse 82, temperature 98.4 F (36.9 C), temperature source Oral, resp. rate 16, height 6' (1.829 m), weight 210 lb 4 oz (95.4 kg), SpO2 97 %. VS: noted--normal. Gen: alert, NAD, NONTOXIC APPEARING. HEENT: eyes without injection, drainage, or swelling.  Ears: EACs clear, TMs with normal light reflex and landmarks.  Nose: Clear rhinorrhea, with some dried, crusty exudate adherent to mildly injected mucosa.  No purulent d/c.  No paranasal sinus TTP.  No facial swelling.  Throat and mouth without focal lesion.  No pharyngial swelling, erythema, or exudate.   Neck: supple, no LAD.   LUNGS: CTA bilat, nonlabored resps.   CV: RRR, no m/r/g. EXT: no c/c/e SKIN: no rash  LABS:    Chemistry      Component Value Date/Time   NA 139 11/23/2016 1156   K 4.5 11/23/2016 1156   CL 104 11/23/2016 1156   CO2 30 11/23/2016 1156   BUN 13 11/23/2016 1156   CREATININE 0.86 11/23/2016 1156      Component Value Date/Time   CALCIUM 9.3  11/23/2016 1156   ALKPHOS 40 10/30/2016 1232   AST 25 10/30/2016 1232   ALT 20 10/30/2016 1232   BILITOT 0.6 10/30/2016 1232     Lab Results  Component Value Date   INR 2.1 01/18/2017   INR 1.7 01/08/2017   INR 2.7 12/25/2016   IMPRESSION AND PLAN:  Acute bronchitis, suspect viral etiology. No sign of RAD. Continue symptomatic care with mucinex DM. Signs/symptoms to call or return for were reviewed and pt expressed understanding.  An After Visit Summary was printed and given to the patient.  FOLLOW UP: Return if symptoms worsen or fail to improve.  Signed:  Crissie Sickles, MD           01/28/2017

## 2017-02-01 ENCOUNTER — Encounter: Payer: Self-pay | Admitting: *Deleted

## 2017-02-01 ENCOUNTER — Ambulatory Visit (HOSPITAL_COMMUNITY): Payer: PPO | Attending: Cardiology

## 2017-02-01 ENCOUNTER — Ambulatory Visit (INDEPENDENT_AMBULATORY_CARE_PROVIDER_SITE_OTHER): Payer: PPO | Admitting: Pharmacist

## 2017-02-01 ENCOUNTER — Other Ambulatory Visit: Payer: Self-pay | Admitting: *Deleted

## 2017-02-01 DIAGNOSIS — I348 Other nonrheumatic mitral valve disorders: Secondary | ICD-10-CM | POA: Insufficient documentation

## 2017-02-01 DIAGNOSIS — I77810 Thoracic aortic ectasia: Secondary | ICD-10-CM

## 2017-02-01 DIAGNOSIS — Z954 Presence of other heart-valve replacement: Secondary | ICD-10-CM | POA: Diagnosis not present

## 2017-02-01 DIAGNOSIS — I313 Pericardial effusion (noninflammatory): Secondary | ICD-10-CM | POA: Diagnosis not present

## 2017-02-01 DIAGNOSIS — I719 Aortic aneurysm of unspecified site, without rupture: Secondary | ICD-10-CM

## 2017-02-01 DIAGNOSIS — I501 Left ventricular failure: Secondary | ICD-10-CM | POA: Insufficient documentation

## 2017-02-01 DIAGNOSIS — Z5181 Encounter for therapeutic drug level monitoring: Secondary | ICD-10-CM

## 2017-02-01 DIAGNOSIS — I351 Nonrheumatic aortic (valve) insufficiency: Secondary | ICD-10-CM | POA: Diagnosis not present

## 2017-02-01 DIAGNOSIS — Z9889 Other specified postprocedural states: Secondary | ICD-10-CM

## 2017-02-01 DIAGNOSIS — I7121 Aneurysm of the ascending aorta, without rupture: Secondary | ICD-10-CM

## 2017-02-01 HISTORY — PX: TRANSTHORACIC ECHOCARDIOGRAM: SHX275

## 2017-02-01 LAB — POCT INR: INR: 1.9

## 2017-02-01 NOTE — Patient Outreach (Signed)
Case Closure. The last 3 times I have called Mr. Chaffer, I have left messages but did not hear back from him. In reviewing his chart, he has not been to the ED or been hospitialized and continues to go to his MD appointments.  I left a message that I am closing his case and that if he needs nursing advise he can call me in the future or our 24 hour nurse line. I also advised that I would be sending him a closure letter and letting Dr. Anitra Lauth know that I have closed his case.  Deloria Lair [facility] [phone]

## 2017-02-02 ENCOUNTER — Other Ambulatory Visit: Payer: Self-pay | Admitting: *Deleted

## 2017-02-02 MED ORDER — METOPROLOL SUCCINATE ER 50 MG PO TB24: 50.0000 mg | ORAL_TABLET | Freq: Every day | ORAL | 3 refills | Status: DC

## 2017-02-02 MED ORDER — LOSARTAN POTASSIUM 25 MG PO TABS: 25.0000 mg | ORAL_TABLET | Freq: Every day | ORAL | 3 refills | Status: DC

## 2017-02-09 ENCOUNTER — Ambulatory Visit: Payer: PPO

## 2017-02-12 NOTE — Progress Notes (Signed)
Pre visit review using our clinic review tool, if applicable. No additional management support is needed unless otherwise documented below in the visit note. 

## 2017-02-12 NOTE — Progress Notes (Signed)
Subjective:   Kenneth Ryan is a 71 y.o. male who presents for an Initial Medicare Annual Wellness Visit.  Review of Systems  No ROS.  Medicare Wellness Visit.  Cardiac Risk Factors include: advanced age (>80men, >81 women);family history of premature cardiovascular disease;male gender   Sleep patterns: Sleeps well. Feels tired, occasionally naps.  Home Safety/Smoke Alarms:  Smoke detectors in place.  Living environment; residence and Firearm Safety: Lives with wife in 1 story home. 3 steps at door, no issues. No firearms. Brother in law currently living in home.  Seat Belt Safety/Bike Helmet: Wears seat belt.   Counseling:   Eye Exam-Last exam > 5 years. Will make appointment. Dr. Nicki Reaper Dental-Last exam 02/03/2017. Every 4 months by [facility]: [place] Rhonda W.   Male:   CCS-Colonoscopy 12/22/2011 Pt reports normal, recall 10 years.  PSA-09/24/2016, 4.170. Followed by Urology.     Objective:    Today's Vitals   02/15/17 0859  BP: 130/64  Pulse: 78  SpO2: 98%  Weight: 207 lb 12.8 oz (94.3 kg)  Height: 6' (1.829 m)   Body mass index is 28.18 kg/m.  Current Medications (verified) Outpatient Encounter Prescriptions as of 02/15/2017  Medication Sig  . acetaminophen (TYLENOL) 500 MG tablet Take 500-1,000 mg by mouth every 6 (six) hours as needed for mild pain (depends on pain level if takes 500 mg or 1000 mg).   Marland Kitchen aspirin EC 81 MG EC tablet Take 1 tablet (81 mg total) by mouth daily.  . fenofibrate (TRICOR) 145 MG tablet TAKE 1 TABLET(145 MG) BY MOUTH DAILY  . losartan (COZAAR) 25 MG tablet Take 1 tablet (25 mg total) by mouth daily.  . metoprolol succinate (TOPROL-XL) 50 MG 24 hr tablet Take 1 tablet (50 mg total) by mouth daily.  . Multiple Vitamins-Minerals (MULTIVITAMIN PO) Take 1 tablet by mouth daily.   . pantoprazole (PROTONIX) 40 MG tablet TAKE 1 TABLET BY MOUTH EVERY DAY  . warfarin (COUMADIN) 5 MG tablet Take as directed by [facility]mad[facility]   No facility-administered encounter  medications on file as of 02/15/2017.     Allergies (verified) Patient has no known allergies.   History: Past Medical History:  Diagnosis Date  . 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  . Basal cell carcinoma    nose  . Chronic hoarseness    secondary to hx of laryngeal surgery  . GERD (gastroesophageal reflux disease)   . Headache   . Heart murmur   . History of tobacco abuse   . Hypertriglyceridemia   . Increased prostate specific antigen (PSA) velocity 09/2016   saw Dr. Jeffie Pollock 11/18/16: plan is to repeat PSA 6 wks and if still same/up then plan TRUS+ bx.  . 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  Marland Kitchen Lesion of vocal cord   . Mitral regurgitation    severe by TEE 08/2016:  MV repair done 11/03/16  . Mitral valve prolapse   . 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  Per CV surg, INR goal for this valve is 2-2.5.  Marland Kitchen s/p Bentall procedure for aortic root aneurysm ([place]) 11/03/2016  . S/P mitral valve repair  11/03/2016   Complex valvuloplasty including artificial Gore-tex neochord placement x6 and 28 mm Sorin Memo 3D ring annuloplasty   Past Surgical History:  Procedure Laterality Date  . BENTALL PROCEDURE N/A 11/03/2016   Procedure: BENTALL PROCEDURE AORTIC ROOT REPLACEMENT + mechanical AV;  Surgeon: Rexene Alberts, MD;  Location: [place];  Service: Open Heart Surgery;  Laterality: N/A;  . CARDIAC CATHETERIZATION N/A 09/29/2016   Clean coronaries, EF 60-65%.  Procedure: Right/Left Heart Cath and Coronary Angiography;  Surgeon: Jettie Booze, MD;  Location: [place] CV [facility];   Service: Cardiovascular;  Laterality: N/A;  . CHOLECYSTECTOMY  1980  . COLONOSCOPY  2003 and 2013   Both normal.  . DIRECT LARYNGOSCOPY Left 10/17/2015   Procedure: MICRO DIRECT LARYNGOSCOPY WITH FROZEN SECTION;  Surgeon: Jodi Marble, MD;  Location: [place];  Service: ENT;  Laterality: Left;  . MITRAL VALVE REPAIR N/A 11/03/2016   Procedure: MITRAL VALVE REPAIR (MVR);  Surgeon: Rexene Alberts, MD;  Location: [place];  Service: Open Heart Surgery;  Laterality: N/A;  . PERIPHERAL VASCULAR CATHETERIZATION N/A 09/29/2016   Procedure: Abdominal Aortogram;  Surgeon: Jettie Booze, MD;  Location: [place] CV [facility];  Service: Cardiovascular;  Laterality: N/A;  . PERIPHERAL VASCULAR CATHETERIZATION N/A 09/29/2016   Procedure: Thoracic Aortogram;  Surgeon: Jettie Booze, MD;  Location: [place] [facility];  Service: Cardiovascular;  Laterality: N/A;  . TEE WITHOUT CARDIOVERSION N/A 09/17/2016   Procedure: TRANSESOPHAGEAL ECHOCARDIOGRAM (TEE);  Surgeon: Jerline Pain, MD;  Location: [place];  Service: Cardiovascular;  Laterality: N/A;  . TEE WITHOUT CARDIOVERSION N/A 11/03/2016   Procedure: TRANSESOPHAGEAL ECHOCARDIOGRAM (TEE);  Surgeon: Rexene Alberts, MD;  Location: [place];  Service: Open Heart Surgery;  Laterality: N/A;  . TONSILECTOMY/ADENOIDECTOMY WITH MYRINGOTOMY  1960  . 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  Mod/severe AR w/functional bicuspid AV.  EF 45%.  . TRANSTHORACIC ECHOCARDIOGRAM  07/2015   Bicuspid aortic valve + mod mitral regurg  . Vocal cord biopsy  09/2015   No atypia or inflammation.  Some fibrous changes that are possibly from hx of radiation therapy.   Family History  Problem Relation Age of Onset  . Heart disease Father   . Breast cancer Mother   . Diabetes Sister    Social History   Occupational History  . Not on file.   Social History Main Topics  . Smoking status: Former Smoker    Quit date: 07/22/1979  .  Smokeless tobacco: Never Used  . Alcohol use Yes     Comment: rare  . Drug use: No  . Sexual activity: Not on file   Tobacco Counseling Counseling given: Not Answered   Activities of Daily Living In your present state of health, do you have any difficulty performing the following activities: 02/15/2017 11/24/2016  Hearing? N N  Vision? N N  Difficulty concentrating or making decisions? N N  Walking or climbing stairs? N N  Dressing or bathing? N N  Doing errands, shopping? N N  Preparing Food and eating ? N N  Using the Toilet? N N  In the past six months, have you accidently leaked urine? N N  Do you have problems with loss of bowel control? N N  Managing your Medications? N N  Managing your Finances? N N  Housekeeping or managing your Housekeeping? N N  Some recent data might be hidden    Immunizations and Health Maintenance Immunization History  Administered  Date(s) Administered  . Influenza, High Dose Seasonal PF 09/07/2016  . Influenza,inj,Quad PF,36+ Mos 09/15/2013, 08/28/2015  . Pneumococcal Conjugate-13 08/28/2015  . Pneumococcal Polysaccharide-23 09/18/2016  . Tdap 08/28/2015   There are no preventive care reminders to display for this patient.  Patient Care Team: Tammi Sou, MD as PCP - General (Family Medicine) Jerline Pain, MD as Consulting Physician (Cardiology) Rexene Alberts, MD as Consulting Physician (Cardiothoracic Surgery) [name], MD as Consulting Physician (Urology)  Indicate any recent Medical Services you may have received from other than [facility] providers in the past year (date may be approximate).    Assessment:   This is a routine wellness examination for Geoffry. Physical assessment deferred to PCP.   Hearing/Vision screen Hearing Screening Comments: Able to hear conversational tones w/o difficulty. No issues reported.   Vision Screening Comments: Wears glasses  Dietary issues and exercise activities discussed: Current Exercise  Habits: The patient does not participate in regular exercise at present (Walking prior to bad weather and sickness), Exercise limited by: cardiac condition(s) (Walking has been approved by cardiology)   Diet (meal preparation, eat out, water intake, caffeinated beverages, dairy products, fruits and vegetables): Drinks coffee (2 cups), soda, and water.   Breakfast: Cereal, coffee Lunch: Leftovers Dinner: protein, vegetables.      Discussed heart healthy diet and increasing water intake. Patient s/p cardiac surgery, will discuss increasing exercise with cardiologist this week.   Goals    None     Depression Screen PHQ 2/9 Scores 02/15/2017 12/16/2016 09/18/2016 08/28/2015  PHQ - 2 Score 0 0 0 0    Fall Risk Fall Risk  02/15/2017 12/16/2016 09/18/2016 08/28/2015  Falls in the past year? No No No No  Risk for fall due to : - Medication side effect - -    Cognitive Function: MMSE - Mini Mental State Exam 02/15/2017  Orientation to time 5  Orientation to Place 5  Registration 3  Attention/ Calculation 5  Recall 2  Language- name 2 objects 2  Language- repeat 1  Language- follow 3 step command 3  Language- read & follow direction 1  Write a sentence 1  Copy design 1  Total score 29          Screening Tests Health Maintenance  Topic Date Due  . Hepatitis C Screening  10/03/2036 (Originally Nov 04, 1946)  . COLONOSCOPY  12/21/2021  . TETANUS/TDAP  08/27/2025  . INFLUENZA VACCINE  Completed  . PNA vac Low Risk Adult  Completed        Plan:     Continue doing brain stimulating activities (puzzles, reading, adult coloring books, staying active) to keep memory sharp.   Confirm with cardiology regarding exercise.  Make appointment with eye doctor.   Bring a copy of your advance directives to your next office visit.   During the course of the visit Firmin was educated and counseled about the following appropriate screening and preventive services:   Vaccines to include  Pneumoccal, Influenza, Hepatitis B, Td, Zostavax, HCV  Colorectal cancer screening  Cardiovascular disease screening  Diabetes screening  Glaucoma screening  Nutrition counseling  Prostate cancer screening  Patient Instructions (the written plan) were given to the patient.   Gerilyn Nestle, RN   02/15/2017

## 2017-02-12 NOTE — Progress Notes (Signed)
Cardiology Office Note    Date:  02/17/2017   ID:  Kenneth Ryan, DOB 12-19-1946, MRN [id number]  PCP:  Kenneth Sou, MD  Cardiologist:  Dr. Marlou Ryan  CC: follow up   History of Present Illness:  Kenneth Ryan is a 71 y.o. male with a history of a bicuspid AV s/p Bentall procedure with mechanical AVR, aortic root replacement and MV repair (10/2016) on chronic anticoagulation with coumadin, HTN, tobacco abuse and recently diagnosed LV dysfunction who presents to clinic for follow up.   No CAD, EF 55%, severe AR and severe Kenneth prior to procedure on 11/03/16.  He was seen by Dr. Marlou Ryan in the office and was doing well. 2D ECHO on 02/01/17 showed EF 35% w/ diffuse HK, G1DD, trival AR (prosthesis difficult to visualize), small pericardial effusion. Dr. Marlou Ryan commented that a reduction in LVEF is common after MV repair but felt like this was lower than expected. Kenneth Ryan was started on Losartan 25mg  daily and Toprol XL increased to 50mg  daily.   Today he presents to clinic for med titration and follow up. He has never had any symptoms even before the surgery. No CP or SOB. No LE edema, orthopnea or PND. No dizziness or syncope. No blood in stool or urine. No palpitations. He does sometimes get a little lightheaded when standing up from sitting. Checks BP at home and usually runs 120/70. He did see the dentist recently and always use prophylatic abx.     Past Medical History:  Diagnosis Date  . 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)    Repaired 11/03/16.  . Basal cell carcinoma    nose  . Chronic hoarseness    secondary to hx of laryngeal surgery  . GERD (gastroesophageal reflux disease)   . Headache   . Heart murmur   . History of tobacco abuse   . Hypertriglyceridemia   . Increased prostate specific antigen (PSA) velocity 09/2016   saw Dr. Jeffie Ryan 11/18/16: plan is to repeat PSA 6 wks and if still  same/up then plan TRUS+ bx.  . Laryngeal cancer [facility]) 2002   Surgery and radiation---He had post treatment surveillance x 12 yrs and was officially "released" from f/u in summer 2014.  Marland Kitchen Lesion of vocal cord   . Mitral regurgitation    severe by TEE 08/2016:  MV repair done 11/03/16  . Mitral valve prolapse   . 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  Per CV surg, as of 02/16/17, his INR goal is 1.5-2.5.  Marland Kitchen s/p Bentall procedure for aortic root aneurysm ([place]) 11/03/2016  . 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   Complex valvuloplasty including artificial Gore-tex neochord placement x6 and 28 mm Sorin Memo 3D ring annuloplasty    Past Surgical History:  Procedure Laterality Date  . BENTALL PROCEDURE N/A 11/03/2016   Procedure: BENTALL PROCEDURE AORTIC ROOT REPLACEMENT + mechanical AV;  Surgeon: Kenneth Alberts, MD;  Location: [place];  Service: Open Heart Surgery;  Laterality: N/A;  . CARDIAC CATHETERIZATION N/A 09/29/2016   Clean coronaries, EF 60-65%.  Procedure: Right/Left Heart Cath and Coronary Angiography;  Surgeon: Kenneth Booze, MD;  Location: [place] [facility];  Service: Cardiovascular;  Laterality: N/A;  . CHOLECYSTECTOMY  1980  . COLONOSCOPY  03/28/2002 and 03/28/2012   Both normal.  . DIRECT LARYNGOSCOPY Left 10/17/2015   Procedure: MICRO DIRECT LARYNGOSCOPY WITH FROZEN SECTION;  Surgeon: Kenneth Marble, MD;  Location: [place];  Service: ENT;  Laterality: Left;  . MITRAL VALVE REPAIR N/A 11/03/2016   Procedure: MITRAL VALVE REPAIR (MVR);  Surgeon: Kenneth Alberts, MD;  Location: [place];  Service: Open Heart Surgery;  Laterality: N/A;  . PERIPHERAL VASCULAR CATHETERIZATION N/A 09/29/2016   Procedure: Abdominal Aortogram;  Surgeon: Kenneth Booze, MD;  Location: [place] [facility];  Service:  Cardiovascular;  Laterality: N/A;  . PERIPHERAL VASCULAR CATHETERIZATION N/A 09/29/2016   Procedure: Thoracic Aortogram;  Surgeon: Kenneth Booze, MD;  Location: [place] [facility];  Service: Cardiovascular;  Laterality: N/A;  . TEE WITHOUT CARDIOVERSION N/A 09/17/2016   Procedure: TRANSESOPHAGEAL ECHOCARDIOGRAM (TEE);  Surgeon: Kenneth Pain, MD;  Location: [place];  Service: Cardiovascular;  Laterality: N/A;  . TEE WITHOUT CARDIOVERSION N/A 11/03/2016   Procedure: TRANSESOPHAGEAL ECHOCARDIOGRAM (TEE);  Surgeon: Kenneth Alberts, MD;  Location: [place];  Service: Open Heart Surgery;  Laterality: N/A;  . TONSILECTOMY/ADENOIDECTOMY WITH MYRINGOTOMY  1960  . TRANSESOPHAGEAL ECHOCARDIOGRAM  09/17/2016   Severe Kenneth with ruptured chordae.  Mod/severe AR w/functional bicuspid AV.  EF 45%.  . TRANSTHORACIC ECHOCARDIOGRAM  07/2015   Bicuspid aortic valve + mod mitral regurg  . TRANSTHORACIC ECHOCARDIOGRAM  02/01/2017   (Post aortic root/valve surgery): EF 35%, diffuse hypokinesis, LV dilatation, grade I DD. Cardiologist added ARB and increased his BB dose.  . Vocal cord biopsy  09/2015   No atypia or inflammation.  Some fibrous changes that are possibly from hx of radiation therapy.    Current Medications: Outpatient Medications Prior to Visit  Medication Sig Dispense Refill  . acetaminophen (TYLENOL) 500 MG tablet Take 500-1,000 mg by mouth every 6 (six) hours as needed for mild Ryan (depends on Ryan level if takes 500 mg or 1000 mg).     Marland Kitchen aspirin EC 81 MG EC tablet Take 1 tablet (81 mg total) by mouth daily.    . fenofibrate (TRICOR) 145 MG tablet TAKE 1 TABLET(145 MG) BY MOUTH DAILY 90 tablet 3  . metoprolol succinate (TOPROL-XL) 50 MG 24 hr tablet Take 1 tablet (50 mg total) by mouth daily. 30 tablet 3  . Multiple Vitamins-Minerals (MULTIVITAMIN PO) Take 1 tablet by mouth daily.     . pantoprazole (PROTONIX) 40 MG tablet TAKE 1 TABLET BY MOUTH EVERY DAY 90 tablet 3  . warfarin (COUMADIN)  5 MG tablet Take as directed by [facility] 30 tablet 3  . losartan (COZAAR) 25 MG tablet Take 1 tablet (25 mg total) by mouth daily. 30 tablet 3   No facility-administered medications prior to visit.      Allergies:   Patient has no known allergies.   Social History   Social History  . Marital status: Married    Spouse name: N/A  . Number of children: N/A  . Years of education: N/A   Social History Main Topics  . Smoking status: Former Smoker    Quit date: 07/22/1979  . Smokeless tobacco: Never Used  . Alcohol use Yes     Comment: rare  . Drug use: No  . Sexual activity: Not Asked   Other Topics Concern  . None   Social History Narrative   Married, one son deceased March 29, 2011.   Relocated from [place] to [facility] 06/2013 to be near grandchildren.   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  Smoker 20 pack-yr hx: quit about 1980.   Alcohol: 3 bottles of beer per week avg.     Drugs: none              Family History:  The patient's family history includes Breast cancer in his mother; Diabetes in his sister; Heart disease in his father.      VS:  BP 128/70 (BP Location: Left Arm, Patient Position: Sitting, Cuff Size: Normal)   Pulse 76   Ht 6' (1.829 m)   Wt 208 lb 1.9 oz (94.4 kg)   BMI 28.23 kg/m    GEN: Well nourished, well developed, in no acute distress  HEENT: normal  Neck: no JVD, carotid bruits, or masses Cardiac: RRR; no murmurs, rubs, or gallops,no edema  Respiratory:  clear to auscultation bilaterally, normal work of breathing GI: soft, nontender, nondistended, + BS MS: no deformity or atrophy  Skin: warm and dry, no rash Neuro:  Alert and Oriented x 3, Strength and sensation are intact Psych: euthymic mood, full affect   Wt Readings from Last 3 Encounters:  02/17/17 208 lb 1.9 oz (94.4 kg)  02/15/17 200 lb (90.7 kg)  02/15/17 207 lb 12.8 oz (94.3 kg)      Studies/Labs Reviewed:   EKG:  EKG is NOT ordered today.    Recent Labs: 10/30/2016: ALT  20 11/04/2016: Magnesium 2.2 11/23/2016: BUN 13; Creat 0.86; Hemoglobin 11.6; Platelets 410; Potassium 4.5; Sodium 139 11/24/2016: TSH 1.71   Lipid Panel    Component Value Date/Time   CHOL 135 09/24/2016 0847   TRIG 67.0 09/24/2016 0847   HDL 40.30 09/24/2016 0847   CHOLHDL 3 09/24/2016 0847   VLDL 13.4 09/24/2016 0847   LDLCALC 82 09/24/2016 0847    Additional studies/ records that were reviewed today include:  09/29/16 Abdominal Aortogram  Right/Left Heart Cath and Coronary Angiography  Thoracic Aortogram  Conclusion     The left ventricular ejection fraction is 55-65% by visual estimate. LV was visualized due to severe AI on supravalvular aortogram.  Hemodynamic findings consistent with mild pulmonary hypertension.  No significant CAD.  Prominent V waves consistent with severe mitral regurgitation.  No renal artery stenosis noted. No AAA.  There is severe (4+) aortic regurgitation.   Continue with plans for referral to Dr. Roxy Manns for valve surgery.       TEE 11/03/17 Conclusions  Result status: Final result   Left ventricle: Cavity is moderately dilated. End diastolic diameter of 6 cm. Concentric hypertrophy. 1.2cm inferior base thickness. LV systolic function is mildly to moderately reduced with an EF of 40-45%. There are no obvious wall motion abnormalities. No thrombus present. No mass present.  Septum: No Patent Foramen Ovale present.  Left atrium: Patent foramen ovale not present.  Left atrium: No spontaneous echo contrast.  Aortic valve: The valve is bicuspid and has systolic doming. Moderate valve thickening present. Moderate valve calcification present. Mildly decreased leaflet separation. Mild stenosis. Severe regurgitation. Holodiastolic flow reversal in the descending thoracic aorta. No AV vegetation.  Aorta: The aortic root is dilated at the sinuses of Valsalva. The ascending aorta is dilated.  Mitral valve: The mitral annulus is dilated. The A2  segment is flail with connected ruptured chordae from the tip of the papillary muscle. No leaflet thickening and calcification present. Ruptured chordae causing anterior leaflet incompetence. Severe regurgitation. Flail portion of the anterior leaflet involving the middle segment.  Right ventricle: Normal cavity size, wall thickness and ejection fraction.  Tricuspid valve: Trace regurgitation.  The tricuspid valve regurgitation jet is central.  Mitral valve: Mild stenosis.  Aorta: Graft present.      2D ECHO: 02/01/2017 Study Conclusions - Left ventricle: LVEF is approximately 35% with diffuse   hypokinesis. The cavity size was severely dilated. Wall thickness   was normal. Doppler parameters are consistent with abnormal left   ventricular relaxation (grade 1 diastolic dysfunction). - Aortic valve: AV prosthesis is difficult to see. Peak and mean   gradients through the valve are 11 and 5 mm Hg respectively.   There was trivial regurgitation. - Mitral valve: Moderately calcified annulus. Moderately thickened   leaflets . - Pericardium, extracardiac: A small pericardial effusion was   identified.   ASSESSMENT & PLAN:   Bicuspid AV s/p Bentall Procedure with mechanical AVR, aortic root replacement and mitral valve repair: -- Continue coumadin, INR followed in our coumadin clinic. Most recent INR 1.9 -- Most recent echo showed stable replacements/repair but worsening LV dysfunction  HTN: BP well controlled.   LV dysfunction: no clinical CHF. Recently started on Losartan 25mg  daily. Will get a BMET today and increase Losartan 25mg  to 50mg  daily. Continue Toprol XL 50mg  daily. Discussed s/s of CHF. No need for diuretic at this time. Disucssed salt and fluid restrictions. Plan for repeat echo in 3 months to reassess LV function and then follow up with Dr. Marlou Ryan or myself   Tobacco abuse: he no longer smokes.    Medication Adjustments/Labs and Tests Ordered: Current medicines are  reviewed at length with the patient today.  Concerns regarding medicines are outlined above.  Medication changes, Labs and Tests ordered today are listed in the Patient Instructions below. Patient Instructions  Medication Instructions:  Your physician has recommended you make the following change in your medication:  1.  INCREASE the Losartan to 50 mg taking 1 tablet daily   Labwork: TODAY:  BMET  Testing/Procedures: Your physician has requested that you have an echocardiogram IN 3 MONTHS. Echocardiography is a painless test that uses sound waves to create images of your heart. It provides your doctor with information about the size and shape of your heart and how well your heart's chambers and valves are working. This procedure takes approximately one hour. There are no restrictions for this procedure.    Follow-Up: Your physician recommends that you schedule a follow-up appointment in: [place] ECHO WITH KATIE Lorelle Macaluso, PA-C OR DR. Marlou Ryan   Any Other Special Instructions Will Be Listed Below (If Applicable).  Echocardiogram An echocardiogram, or echocardiography, uses sound waves (ultrasound) to produce an image of your heart. The echocardiogram is simple, painless, obtained within a short period of time, and offers valuable information to your health care provider. The images from an echocardiogram can provide information such as:  Evidence of coronary artery disease (CAD).  Heart size.  Heart muscle function.  Heart valve function.  Aneurysm detection.  Evidence of a past heart attack.  Fluid buildup around the heart.  Heart muscle thickening.  Assess heart valve function. Tell a health care provider about:  Any allergies you have.  All medicines you are taking, including vitamins, herbs, eye drops, creams, and over-the-counter medicines.  Any problems you or family members have had with anesthetic medicines.  Any blood disorders you have.  Any surgeries  you have had.  Any medical conditions you have.  Whether you are pregnant or may be pregnant. What happens before the procedure? No special preparation is needed. Eat and drink normally. What happens during  the procedure?  In order to produce an image of your heart, gel will be applied to your chest and a wand-like tool (transducer) will be moved over your chest. The gel will help transmit the sound waves from the transducer. The sound waves will harmlessly bounce off your heart to allow the heart images to be captured in real-time motion. These images will then be recorded.  You may need an IV to receive a medicine that improves the quality of the pictures. What happens after the procedure? You may return to your normal schedule including diet, activities, and medicines, unless your health care provider tells you otherwise. This information is not intended to replace advice given to you by your health care provider. Make sure you discuss any questions you have with your health care provider. Document Released: 12/04/2000 Document Revised: 07/25/2016 Document Reviewed: 08/14/2013 Elsevier Interactive Patient Education  2017 [employer].    If you need a refill on your cardiac medications before your next appointment, please call your pharmacy.      Signed, Angelena Form, PA-C  02/17/2017 10:40 AM    [facility] [address], [place], [place]  [address] Phone: [phone]; Fax: [phone]

## 2017-02-15 ENCOUNTER — Ambulatory Visit (INDEPENDENT_AMBULATORY_CARE_PROVIDER_SITE_OTHER): Payer: PPO

## 2017-02-15 ENCOUNTER — Ambulatory Visit (INDEPENDENT_AMBULATORY_CARE_PROVIDER_SITE_OTHER): Payer: PPO | Admitting: Thoracic Surgery (Cardiothoracic Vascular Surgery)

## 2017-02-15 ENCOUNTER — Encounter: Payer: Self-pay | Admitting: Thoracic Surgery (Cardiothoracic Vascular Surgery)

## 2017-02-15 VITALS — BP 110/64 | HR 74 | Resp 16 | Ht 72.0 in | Wt 200.0 lb

## 2017-02-15 VITALS — BP 130/64 | HR 78 | Ht 72.0 in | Wt 207.8 lb

## 2017-02-15 DIAGNOSIS — Q231 Congenital insufficiency of aortic valve: Secondary | ICD-10-CM | POA: Diagnosis not present

## 2017-02-15 DIAGNOSIS — Z0000 Encounter for general adult medical examination without abnormal findings: Secondary | ICD-10-CM | POA: Diagnosis not present

## 2017-02-15 DIAGNOSIS — I7121 Aneurysm of the ascending aorta, without rupture: Secondary | ICD-10-CM

## 2017-02-15 DIAGNOSIS — Z9889 Other specified postprocedural states: Secondary | ICD-10-CM | POA: Diagnosis not present

## 2017-02-15 DIAGNOSIS — I341 Nonrheumatic mitral (valve) prolapse: Secondary | ICD-10-CM

## 2017-02-15 DIAGNOSIS — Z954 Presence of other heart-valve replacement: Secondary | ICD-10-CM

## 2017-02-15 DIAGNOSIS — I719 Aortic aneurysm of unspecified site, without rupture: Secondary | ICD-10-CM

## 2017-02-15 DIAGNOSIS — I340 Nonrheumatic mitral (valve) insufficiency: Secondary | ICD-10-CM | POA: Diagnosis not present

## 2017-02-15 NOTE — Addendum Note (Signed)
Addended by: Marylen Ponto on: 02/15/2017 04:42 PM   Modules accepted: Orders

## 2017-02-15 NOTE — Patient Instructions (Addendum)
Continue doing brain stimulating activities (puzzles, reading, adult coloring books, staying active) to keep memory sharp.   Confirm with cardiology regarding exercise.  Make appointment with eye doctor.   Bring a copy of your advance directives to your next office visit.   Fall Prevention in the Home Introduction Falls can cause injuries. They can happen to people of all ages. There are many things you can do to make your home safe and to help prevent falls. What can I do on the outside of my home?  Regularly fix the edges of walkways and driveways and fix any cracks.  Remove anything that might make you trip as you walk through a door, such as a raised step or threshold.  Trim any bushes or trees on the path to your home.  Use bright outdoor lighting.  Clear any walking paths of anything that might make someone trip, such as rocks or tools.  Regularly check to see if handrails are loose or broken. Make sure that both sides of any steps have handrails.  Any raised decks and porches should have guardrails on the edges.  Have any leaves, snow, or ice cleared regularly.  Use sand or salt on walking paths during winter.  Clean up any spills in your garage right away. This includes oil or grease spills. What can I do in the bathroom?  Use night lights.  Install grab bars by the toilet and in the tub and shower. Do not use towel bars as grab bars.  Use non-skid mats or decals in the tub or shower.  If you need to sit down in the shower, use a plastic, non-slip stool.  Keep the floor dry. Clean up any water that spills on the floor as soon as it happens.  Remove soap buildup in the tub or shower regularly.  Attach bath mats securely with double-sided non-slip rug tape.  Do not have throw rugs and other things on the floor that can make you trip. What can I do in the bedroom?  Use night lights.  Make sure that you have a light by your bed that is easy to reach.  Do  not use any sheets or blankets that are too big for your bed. They should not hang down onto the floor.  Have a firm chair that has side arms. You can use this for support while you get dressed.  Do not have throw rugs and other things on the floor that can make you trip. What can I do in the kitchen?  Clean up any spills right away.  Avoid walking on wet floors.  Keep items that you use a lot in easy-to-reach places.  If you need to reach something above you, use a strong step stool that has a grab bar.  Keep electrical cords out of the way.  Do not use floor polish or wax that makes floors slippery. If you must use wax, use non-skid floor wax.  Do not have throw rugs and other things on the floor that can make you trip. What can I do with my stairs?  Do not leave any items on the stairs.  Make sure that there are handrails on both sides of the stairs and use them. Fix handrails that are broken or loose. Make sure that handrails are as long as the stairways.  Check any carpeting to make sure that it is firmly attached to the stairs. Fix any carpet that is loose or worn.  Avoid having throw rugs  at the top or bottom of the stairs. If you do have throw rugs, attach them to the floor with carpet tape.  Make sure that you have a light switch at the top of the stairs and the bottom of the stairs. If you do not have them, ask someone to add them for you. What else can I do to help prevent falls?  Wear shoes that:  Do not have high heels.  Have rubber bottoms.  Are comfortable and fit you well.  Are closed at the toe. Do not wear sandals.  If you use a stepladder:  Make sure that it is fully opened. Do not climb a closed stepladder.  Make sure that both sides of the stepladder are locked into place.  Ask someone to hold it for you, if possible.  Clearly mark and make sure that you can see:  Any grab bars or handrails.  First and last steps.  Where the edge of each  step is.  Use tools that help you move around (mobility aids) if they are needed. These include:  Canes.  Walkers.  Scooters.  Crutches.  Turn on the lights when you go into a dark area. Replace any light bulbs as soon as they burn out.  Set up your furniture so you have a clear path. Avoid moving your furniture around.  If any of your floors are uneven, fix them.  If there are any pets around you, be aware of where they are.  Review your medicines with your doctor. Some medicines can make you feel dizzy. This can increase your chance of falling. Ask your doctor what other things that you can do to help prevent falls. This information is not intended to replace advice given to you by your health care provider. Make sure you discuss any questions you have with your health care provider. Document Released: 10/03/2009 Document Revised: 05/14/2016 Document Reviewed: 01/11/2015  2017 [employer]  Health Maintenance, Male A healthy lifestyle and preventative care can promote health and wellness.  Maintain regular health, dental, and eye exams.  Eat a healthy diet. Foods like vegetables, fruits, whole grains, low-fat dairy products, and lean protein foods contain the nutrients you need and are low in calories. Decrease your intake of foods high in solid fats, added sugars, and salt. Get information about a proper diet from your health care provider, if necessary.  Regular physical exercise is one of the most important things you can do for your health. Most adults should get at least 150 minutes of moderate-intensity exercise (any activity that increases your heart rate and causes you to sweat) each week. In addition, most adults need muscle-strengthening exercises on 2 or more days a week.   Maintain a healthy weight. The body mass index (BMI) is a screening tool to identify possible weight problems. It provides an estimate of body fat based on height and weight. Your health care provider  can find your BMI and can help you achieve or maintain a healthy weight. For males 20 years and older:  A BMI below 18.5 is considered underweight.  A BMI of 18.5 to 24.9 is normal.  A BMI of 25 to 29.9 is considered overweight.  A BMI of 30 and above is considered obese.  Maintain normal blood lipids and cholesterol by exercising and minimizing your intake of saturated fat. Eat a balanced diet with plenty of fruits and vegetables. Blood tests for lipids and cholesterol should begin at age 92 and be repeated every 5  years. If your lipid or cholesterol levels are high, you are over age 8, or you are at high risk for heart disease, you may need your cholesterol levels checked more frequently.Ongoing high lipid and cholesterol levels should be treated with medicines if diet and exercise are not working.  If you smoke, find out from your health care provider how to quit. If you do not use tobacco, do not start.  Lung cancer screening is recommended for adults aged 55-80 years who are at high risk for developing lung cancer because of a history of smoking. A yearly low-dose CT scan of the lungs is recommended for people who have at least a 30-pack-year history of smoking and are current smokers or have quit within the past 15 years. A pack year of smoking is smoking an average of 1 pack of cigarettes a day for 1 year (for example, a 30-pack-year history of smoking could mean smoking 1 pack a day for 30 years or 2 packs a day for 15 years). Yearly screening should continue until the smoker has stopped smoking for at least 15 years. Yearly screening should be stopped for people who develop a health problem that would prevent them from having lung cancer treatment.  If you choose to drink alcohol, do not have more than 2 drinks per day. One drink is considered to be 12 oz (360 mL) of beer, 5 oz (150 mL) of wine, or 1.5 oz (45 mL) of liquor.  Avoid the use of street drugs. Do not share needles with  anyone. Ask for help if you need support or instructions about stopping the use of drugs.  High blood pressure causes heart disease and increases the risk of stroke. High blood pressure is more likely to develop in:  People who have blood pressure in the end of the normal range (100-139/85-89 mm Hg).  People who are overweight or obese.  People who are African American.  If you are 23-58 years of age, have your blood pressure checked every 3-5 years. If you are 67 years of age or older, have your blood pressure checked every year. You should have your blood pressure measured twice-once when you are at a hospital or clinic, and once when you are not at a hospital or clinic. Record the average of the two measurements. To check your blood pressure when you are not at a hospital or clinic, you can use:  An automated blood pressure machine at a pharmacy.  A home blood pressure monitor.  If you are 23-31 years old, ask your health care provider if you should take aspirin to prevent heart disease.  Diabetes screening involves taking a blood sample to check your fasting blood sugar level. This should be done once every 3 years after age 79 if you are at a normal weight and without risk factors for diabetes. Testing should be considered at a younger age or be carried out more frequently if you are overweight and have at least 1 risk factor for diabetes.  Colorectal cancer can be detected and often prevented. Most routine colorectal cancer screening begins at the age of 77 and continues through age 29. However, your health care provider may recommend screening at an earlier age if you have risk factors for colon cancer. On a yearly basis, your health care provider may provide home test kits to check for hidden blood in the stool. A small camera at the end of a tube may be used to directly examine the colon (  sigmoidoscopy or colonoscopy) to detect the earliest forms of colorectal cancer. Talk to your health  care provider about this at age 81 when routine screening begins. A direct exam of the colon should be repeated every 5-10 years through age 76, unless early forms of precancerous polyps or small growths are found.  People who are at an increased risk for hepatitis B should be screened for this virus. You are considered at high risk for hepatitis B if:  You were born in a country where hepatitis B occurs often. Talk with your health care provider about which countries are considered high risk.  Your parents were born in a high-risk country and you have not received a shot to protect against hepatitis B (hepatitis B vaccine).  You have HIV or AIDS.  You use needles to inject street drugs.  You live with, or have sex with, someone who has hepatitis B.  You are a man who has sex with other men (MSM).  You get hemodialysis treatment.  You take certain medicines for conditions like cancer, organ transplantation, and autoimmune conditions.  Hepatitis C blood testing is recommended for all people born from [birth year] through 1963 and any individual with known risk factors for hepatitis C.  Healthy men should no longer receive prostate-specific antigen (PSA) blood tests as part of routine cancer screening. Talk to your health care provider about prostate cancer screening.  Testicular cancer screening is not recommended for adolescents or adult males who have no symptoms. Screening includes self-exam, a health care provider exam, and other screening tests. Consult with your health care provider about any symptoms you have or any concerns you have about testicular cancer.  Practice safe sex. Use condoms and avoid high-risk sexual practices to reduce the spread of sexually transmitted infections (STIs).  You should be screened for STIs, including gonorrhea and chlamydia if:  You are sexually active and are younger than 24 years.  You are older than 24 years, and your health care provider tells you  that you are at risk for this type of infection.  Your sexual activity has changed since you were last screened, and you are at an increased risk for chlamydia or gonorrhea. Ask your health care provider if you are at risk.  If you are at risk of being infected with HIV, it is recommended that you take a prescription medicine daily to prevent HIV infection. This is called pre-exposure prophylaxis (PrEP). You are considered at risk if:  You are a man who has sex with other men (MSM).  You are a heterosexual man who is sexually active with multiple partners.  You take drugs by injection.  You are sexually active with a partner who has HIV.  Talk with your health care provider about whether you are at high risk of being infected with HIV. If you choose to begin PrEP, you should first be tested for HIV. You should then be tested every 3 months for as long as you are taking PrEP.  Use sunscreen. Apply sunscreen liberally and repeatedly throughout the day. You should seek shade when your shadow is shorter than you. Protect yourself by wearing long sleeves, pants, a wide-brimmed hat, and sunglasses year round whenever you are outdoors.  Tell your health care provider of new moles or changes in moles, especially if there is a change in shape or color. Also, tell your health care provider if a mole is larger than the size of a pencil eraser.  A one-time screening for  abdominal aortic aneurysm (AAA) and surgical repair of large AAAs by ultrasound is recommended for men aged 24-75 years who are current or former smokers.  Stay current with your vaccines (immunizations). This information is not intended to replace advice given to you by your health care provider. Make sure you discuss any questions you have with your health care provider. Document Released: 06/04/2008 Document Revised: 12/28/2014 Document Reviewed: 09/10/2015 [employer] Interactive Patient Education  2017 [employer].

## 2017-02-15 NOTE — Progress Notes (Signed)
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  The patient's early postoperative recovery was uncomplicated and he was last seen here in our office on 12/07/2016 at which time he was doing well.  Since then he has been seen in follow-up by Dr. [name] on 01/18/2017 and his Coumadin dose has been adjusted and monitored through the Coumadin clinic.  He underwent routine follow-up echocardiogram on 02/01/2017. Left ventricular ejection fraction was estimated 35% which was down from 40-45% prior to surgery in the setting of severe mitral regurgitation. The mechanical aortic valve was functioning normally with mean transvalvular gradient estimated 5 mmHg. The mitral valve repair remained intact with trivial residual mitral regurgitation reported.  He returns to our office today for routine follow-up. He states that he has been doing exceptionally well overall, although approximately 2 weeks ago he came down with an upper respiratory tract infection. He has been seen in follow-up by his primary care and apparently did not have the flu. Symptoms are resolving. He otherwise feels remarkably well. His breathing is noticeably better than it was prior to surgery. He no longer has any significant soreness in the chest. He states that he has still not heard from the outpatient cardiac rehabilitation program despite the fact that he wanted to get  started several weeks ago.   Current Outpatient Prescriptions  Medication Sig Dispense Refill  . acetaminophen (TYLENOL) 500 MG tablet Take 500-1,000 mg by mouth every 6 (six) hours as needed for mild pain (depends on pain level if takes 500 mg or 1000 mg).     Marland Kitchen aspirin EC 81 MG EC tablet Take 1 tablet (81 mg total) by mouth daily.    . fenofibrate (TRICOR) 145 MG tablet TAKE 1 TABLET(145 MG) BY MOUTH DAILY 90 tablet 3  . losartan (COZAAR) 25 MG tablet Take 1 tablet (25 mg total) by mouth daily. 30 tablet 3  . metoprolol succinate (TOPROL-XL) 50 MG 24 hr tablet Take 1 tablet (50 mg total) by mouth daily. 30 tablet 3  . Multiple Vitamins-Minerals (MULTIVITAMIN PO) Take 1 tablet by mouth daily.     . pantoprazole (PROTONIX) 40 MG tablet TAKE 1 TABLET BY MOUTH EVERY DAY 90 tablet 3  . warfarin (COUMADIN) 5 MG tablet Take as directed by Coumadin Clinic 30 tablet 3   No current facility-administered medications for this visit.       Physical Exam:   BP 110/64 (BP Location: Right Arm, Patient Position: Sitting, Cuff Size: Normal)   Pulse 74   Resp 16   Ht 6' (1.829 m)   Wt 200 lb (90.7 kg)   SpO2 96% Comment: ON RA  BMI 27.12 kg/m   General:  Well-appearing  Chest:   Clear to auscultation with symmetrical breath sounds  CV:   Regular rate and rhythm, mechanical heart valve sounds with no murmur appreciated  Incisions:  Healing nicely, sternum is stable  Abdomen:  Soft nontender  Extremities:  Warm and well-perfused  Diagnostic Tests:  Transthoracic Echocardiography  Patient:    Kenneth Ryan, Shall MR #:       [id number] Study Date: 02/01/2017 Gender:     M Age:        42 Height:     182.9 cm Weight:     95.4 kg BSA:        2.22 m^2 Pt. Status: Room:   ATTENDING    Candee Furbish, M.D.  ORDERING     Candee Furbish, M.D.  REFERRING    Candee Furbish, M.D.  SONOGRAPHER  Wyatt Mage, RDCS  PERFORMING   [facility],  Outpatient  cc:  -------------------------------------------------------------------  ------------------------------------------------------------------- Indications:      AVR (Z95.4).  ------------------------------------------------------------------- History:   PMH:   Mitral valve prolapse.  Risk factors:  Bicuspid aortic valve. Former tobacco use. Dyslipidemia.  ------------------------------------------------------------------- Study Conclusions  - Left ventricle: LVEF is approximately 35% with diffuse   hypokinesis. The cavity size was severely dilated. Wall thickness   was normal. Doppler parameters are consistent with abnormal left   ventricular relaxation (grade 1 diastolic dysfunction). - Aortic valve: AV prosthesis is difficult to see. Peak and mean   gradients through the valve are 11 and 5 mm Hg respectively.   There was trivial regurgitation. - Mitral valve: Moderately calcified annulus. Moderately thickened   leaflets . - Pericardium, extracardiac: A small pericardial effusion was   identified.  ------------------------------------------------------------------- Labs, prior tests, procedures, and surgery: Transthoracic echocardiography (08/28/2016).    The mitral valve showed severe regurgitation. The aortic valve showed mild regurgitation.  EF was 40%. Aortic valve: peak gradient of 18 mm Hg and mean gradient of 10 mm Hg.  Transesophageal echocardiography (09/17/2016).     EF was 40%. Valve surgery (November 2017).     Mitral valve repair.  Aortic valve replacement with a mechanical valve. Surgery.    Bentall procedure.  ------------------------------------------------------------------- Study data:  Comparison was made to the study of 09/17/2016.  Study status:  Routine.  Procedure:  The patient reported no pain pre or post test. Transthoracic echocardiography. Image quality was adequate.  Study completion:  There were no  complications. Transthoracic echocardiography.  M-mode, complete 2D, 3D, spectral Doppler, and color Doppler.  Birthdate:  Patient birthdate: Jul 10, 1946.  Age:  Patient is 71 yr old.  Sex:  Gender: male. BMI: 28.5 kg/m^2.  Blood pressure:     142/84  Patient status: Outpatient.  Study date:  Study date: 02/01/2017. Study time: 08:21 AM.  Location:  [facility]  -------------------------------------------------------------------  ------------------------------------------------------------------- Left ventricle:  LVEF is approximately 35% with diffuse hypokinesis. The cavity size was severely dilated. Wall thickness was normal. Doppler parameters are consistent with abnormal left ventricular relaxation (grade 1 diastolic dysfunction).  ------------------------------------------------------------------- Aortic valve:  AV prosthesis is difficult to see. Peak and mean gradients through the valve are 11 and 5 mm Hg respectively. Doppler:  There was trivial regurgitation.    VTI ratio of LVOT to aortic valve: 0.73. Peak velocity ratio of LVOT to aortic valve: 0.83. Mean velocity ratio of LVOT to aortic valve: 0.77.    Mean gradient (S): 5 mm Hg. Peak gradient (S): 11 mm Hg.  ------------------------------------------------------------------- Aorta:  Ascending aorta is normal at 28 mm.  ------------------------------------------------------------------- Mitral valve:  s/p MV repair.  Moderately calcified annulus. Moderately thickened leaflets .  Doppler:  There was trivial regurgitation.    Valve area by pressure half-time:  3.55 cm^2. Indexed valve area by pressure half-time: 1.6 cm^2/m^2.    Mean gradient (D): 4 mm Hg. Peak gradient (D): 5 mm Hg.  ------------------------------------------------------------------- Left atrium:  The atrium was normal in size.  ------------------------------------------------------------------- Right ventricle:  The cavity size was normal.  Wall thickness was normal. Systolic function was normal.  ------------------------------------------------------------------- Pulmonic valve:    Structurally normal valve.   Cusp separation was normal.  Doppler:  Transvalvular velocity was within the normal range. There was trivial regurgitation.  ------------------------------------------------------------------- Tricuspid valve:   Structurally normal valve.   Leaflet separation was normal.  Doppler:  Transvalvular velocity was within the normal range. There was no regurgitation.  ------------------------------------------------------------------- Right atrium:  The atrium was normal in size.  ------------------------------------------------------------------- Pericardium:  Small pericardial effusion surrounds heart. A small pericardial effusion was identified.  ------------------------------------------------------------------- Post procedure conclusions Ascending Aorta:  - Ascending aorta is normal at 28 mm.  ------------------------------------------------------------------- Measurements   Left ventricle                         Value          Reference  LV ID, ED, PLAX chordal        (H)     56.52 mm       43 - 52  LV ID, ES, PLAX chordal        (H)     47.6  mm       23 - 38  LV fx shortening, PLAX chordal (L)     16    %        >=29  LV PW thickness, ED                    9.23  mm       ---------  IVS/LV PW ratio, ED                    0.96           <=1.3  LV e&', lateral                         7.8   cm/s     ---------  LV E/e&', lateral                       13.72          ---------  LV e&', medial                          7.65  cm/s     ---------  LV E/e&', medial                        13.99          ---------  LV e&', average                         7.73  cm/s     ---------  LV E/e&', average                       13.85          ---------    Ventricular septum                     Value  Reference  IVS thickness, ED                      8.83  mm       ---------    LVOT                                   Value          Reference  LVOT peak velocity, S                  138   cm/s     ---------  LVOT mean velocity, S                  81.2  cm/s     ---------  LVOT VTI, S                            22.5  cm       ---------  LVOT peak gradient, S                  8     mm Hg    ---------    Aortic valve                           Value          Reference  Aortic valve peak velocity, S          166   cm/s     ---------  Aortic valve mean velocity, S          105   cm/s     ---------  Aortic valve VTI, S                    30.9  cm       ---------  Aortic mean gradient, S                5     mm Hg    ---------  Aortic peak gradient, S                11    mm Hg    ---------  VTI ratio, LVOT/AV                     0.73           ---------  Velocity ratio, peak, LVOT/AV          0.83           ---------  Velocity ratio, mean, LVOT/AV          0.77           ---------    Aorta                                  Value          Reference  Aortic root ID, ED                     34    mm       ---------  Ascending aorta ID, A-P, S             28  mm       ---------    Left atrium                            Value          Reference  LA ID, A-P, ES                         52    mm       ---------  LA ID/bsa, A-P                 (H)     2.34  cm/m^2   <=2.2  LA volume, S                           54.4  ml       ---------  LA volume/bsa, S                       24.5  ml/m^2   ---------  LA volume, ES, 1-p A4C                 55.3  ml       ---------  LA volume/bsa, ES, 1-p A4C             24.9  ml/m^2   ---------  LA volume, ES, 1-p A2C                 53.5  ml       ---------  LA volume/bsa, ES, 1-p A2C             24.1  ml/m^2   ---------    Mitral valve                           Value          Reference  Mitral E-wave peak velocity            107   cm/s     ---------  Mitral  A-wave peak velocity            140   cm/s     ---------  Mitral mean velocity, D                86.8  cm/s     ---------  Mitral deceleration time       (H)     338   ms       150 - 230  Mitral pressure half-time              62    ms       ---------  Mitral mean gradient, D                4     mm Hg    ---------  Mitral peak gradient, D                5     mm Hg    ---------  Mitral E/A ratio, peak                 0.8            ---------  Mitral valve area, PHT, DP             3.55  cm^2     ---------  Mitral valve area/bsa, PHT, DP         1.6   cm^2/m^2 ---------  Mitral annulus VTI, D                  29.7  cm       ---------    Systemic veins                         Value          Reference  Estimated CVP                          3     mm Hg    ---------    Right ventricle                        Value          Reference  TAPSE                                  14.2  mm       ---------  RV s&', lateral, S                      5.54  cm/s     ---------  Legend: (L)  and  (H)  mark values outside specified reference range.  ------------------------------------------------------------------- Prepared and Electronically Authenticated by  Dorris Carnes, M.D. 2018-02-12T23:19:22   Impression:  Patient is doing very well approximately 3 months status post Bentall aortic valve replacement using an On-X bileaflet mechanical prosthetic valve with synthetic conduit and mitral valve repair.    Plan:  We have not recommended any changes to the patient's current medications.  Now that he is 3 months out from surgery his goal INR can be dropped to 1.5-2.5 instead of the customary 2.0-3.0.  The patient may resume unrestricted physical activity without any particular limitations at this time. We will try to contact the cardiac rehabilitation program to make sure that they can get him started as soon as practical.  The patient has been reminded regarding the importance of dental hygiene and the  lifelong need for antibiotic prophylaxis for all dental cleanings and other related invasive procedures.  The patient will return to our office for routine follow-up next November, approximately 1 year following his surgery. He will call and return sooner should specific problems or questions arise.   I spent in excess of 15 minutes during the conduct of this office consultation and >50% of this time involved direct face-to-face encounter with the patient for counseling and/or coordination of their care.   Valentina Gu. Roxy Manns, MD 02/15/2017 1:52 PM

## 2017-02-15 NOTE — Patient Instructions (Signed)
Continue all previous medications without any changes at this time  You may resume unrestricted physical activity without any particular limitations at this time.  You are encouraged to enroll and participate in the outpatient cardiac rehab program beginning as soon as practical.  Endocarditis is a potentially serious infection of heart valves or inside lining of the heart.  It occurs more commonly in patients with diseased heart valves (such as patient's with aortic or mitral valve disease) and in patients who have undergone heart valve repair or replacement.  Certain surgical and dental procedures may put you at risk, such as dental cleaning, other dental procedures, or any surgery involving the respiratory, urinary, gastrointestinal tract, gallbladder or prostate gland.   To minimize your chances for develooping endocarditis, maintain good oral health and seek prompt medical attention for any infections involving the mouth, teeth, gums, skin or urinary tract.    Always notify your doctor or dentist about your underlying heart valve condition before having any invasive procedures. You will need to take antibiotics before certain procedures, including all routine dental cleanings or other dental procedures.  Your cardiologist or dentist should prescribe these antibiotics for you to be taken ahead of time.        

## 2017-02-16 ENCOUNTER — Encounter: Payer: Self-pay | Admitting: Family Medicine

## 2017-02-16 NOTE — Progress Notes (Signed)
AWV reviewed and agree.  Signed:  Crissie Sickles, MD           02/16/2017

## 2017-02-17 ENCOUNTER — Encounter: Payer: Self-pay | Admitting: Physician Assistant

## 2017-02-17 ENCOUNTER — Ambulatory Visit (INDEPENDENT_AMBULATORY_CARE_PROVIDER_SITE_OTHER): Payer: PPO | Admitting: Physician Assistant

## 2017-02-17 VITALS — BP 128/70 | HR 76 | Ht 72.0 in | Wt 208.1 lb

## 2017-02-17 DIAGNOSIS — Z9889 Other specified postprocedural states: Secondary | ICD-10-CM | POA: Diagnosis not present

## 2017-02-17 DIAGNOSIS — I77810 Thoracic aortic ectasia: Secondary | ICD-10-CM | POA: Diagnosis not present

## 2017-02-17 DIAGNOSIS — I10 Essential (primary) hypertension: Secondary | ICD-10-CM | POA: Diagnosis not present

## 2017-02-17 DIAGNOSIS — I5022 Chronic systolic (congestive) heart failure: Secondary | ICD-10-CM

## 2017-02-17 DIAGNOSIS — Q231 Congenital insufficiency of aortic valve: Secondary | ICD-10-CM

## 2017-02-17 DIAGNOSIS — Z954 Presence of other heart-valve replacement: Secondary | ICD-10-CM

## 2017-02-17 LAB — BASIC METABOLIC PANEL
BUN/Creatinine Ratio: 15 (ref 10–24)
BUN: 15 mg/dL (ref 8–27)
CHLORIDE: 102 mmol/L (ref 96–106)
CO2: 23 mmol/L (ref 18–29)
Calcium: 9.3 mg/dL (ref 8.6–10.2)
Creatinine, Ser: 0.97 mg/dL (ref 0.76–1.27)
GFR calc Af Amer: 92 mL/min/{1.73_m2} (ref >59)
GFR, EST NON AFRICAN AMERICAN: 80 mL/min/{1.73_m2} (ref >59)
Glucose: 100 mg/dL — ABNORMAL HIGH (ref 65–99)
POTASSIUM: 4.3 mmol/L (ref 3.5–5.2)
Sodium: 141 mmol/L (ref 134–144)

## 2017-02-17 MED ORDER — LOSARTAN POTASSIUM 50 MG PO TABS: 50.0000 mg | ORAL_TABLET | Freq: Every day | ORAL | 3 refills | Status: DC

## 2017-02-17 MED ORDER — LOSARTAN POTASSIUM 25 MG PO TABS: 25.0000 mg | ORAL_TABLET | Freq: Every day | ORAL | 3 refills | Status: DC

## 2017-02-17 NOTE — Patient Instructions (Addendum)
Medication Instructions:  Your physician has recommended you make the following change in your medication:  1.  INCREASE the Losartan to 50 mg taking 1 tablet daily   Labwork: TODAY:  BMET  Testing/Procedures: Your physician has requested that you have an echocardiogram IN 3 MONTHS. Echocardiography is a painless test that uses sound waves to create images of your heart. It provides your doctor with information about the size and shape of your heart and how well your heart's chambers and valves are working. This procedure takes approximately one hour. There are no restrictions for this procedure.    Follow-Up: Your physician recommends that you schedule a follow-up appointment in: [facility] WITH KATIE THOMPSON, PA-C OR DR. [name]   Any Other Special Instructions Will Be Listed Below (If Applicable).  Echocardiogram An echocardiogram, or echocardiography, uses sound waves (ultrasound) to produce an image of your heart. The echocardiogram is simple, painless, obtained within a short period of time, and offers valuable information to your health care provider. The images from an echocardiogram can provide information such as:  Evidence of coronary artery disease (CAD).  Heart size.  Heart muscle function.  Heart valve function.  Aneurysm detection.  Evidence of a past heart attack.  Fluid buildup around the heart.  Heart muscle thickening.  Assess heart valve function. Tell a health care provider about:  Any allergies you have.  All medicines you are taking, including vitamins, herbs, eye drops, creams, and over-the-counter medicines.  Any problems you or family members have had with anesthetic medicines.  Any blood disorders you have.  Any surgeries you have had.  Any medical conditions you have.  Whether you are pregnant or may be pregnant. What happens before the procedure? No special preparation is needed. Eat and drink normally. What happens  during the procedure?  In order to produce an image of your heart, gel will be applied to your chest and a wand-like tool (transducer) will be moved over your chest. The gel will help transmit the sound waves from the transducer. The sound waves will harmlessly bounce off your heart to allow the heart images to be captured in real-time motion. These images will then be recorded.  You may need an IV to receive a medicine that improves the quality of the pictures. What happens after the procedure? You may return to your normal schedule including diet, activities, and medicines, unless your health care provider tells you otherwise. This information is not intended to replace advice given to you by your health care provider. Make sure you discuss any questions you have with your health care provider. Document Released: 12/04/2000 Document Revised: 07/25/2016 Document Reviewed: 08/14/2013 Elsevier Interactive Patient Education  2017 [employer].    If you need a refill on your cardiac medications before your next appointment, please call your pharmacy.

## 2017-02-22 ENCOUNTER — Ambulatory Visit (INDEPENDENT_AMBULATORY_CARE_PROVIDER_SITE_OTHER): Payer: PPO | Admitting: Pharmacist

## 2017-02-22 DIAGNOSIS — Z5181 Encounter for therapeutic drug level monitoring: Secondary | ICD-10-CM | POA: Diagnosis not present

## 2017-02-22 DIAGNOSIS — Z954 Presence of other heart-valve replacement: Secondary | ICD-10-CM

## 2017-02-22 DIAGNOSIS — Z9889 Other specified postprocedural states: Secondary | ICD-10-CM | POA: Diagnosis not present

## 2017-02-22 DIAGNOSIS — I719 Aortic aneurysm of unspecified site, without rupture: Secondary | ICD-10-CM

## 2017-02-22 DIAGNOSIS — I7121 Aneurysm of the ascending aorta, without rupture: Secondary | ICD-10-CM

## 2017-02-22 LAB — POCT INR: INR: 3.1

## 2017-03-08 ENCOUNTER — Ambulatory Visit (INDEPENDENT_AMBULATORY_CARE_PROVIDER_SITE_OTHER): Payer: PPO | Admitting: Pharmacist

## 2017-03-08 DIAGNOSIS — Z9889 Other specified postprocedural states: Secondary | ICD-10-CM

## 2017-03-08 DIAGNOSIS — Z5181 Encounter for therapeutic drug level monitoring: Secondary | ICD-10-CM | POA: Diagnosis not present

## 2017-03-08 DIAGNOSIS — I719 Aortic aneurysm of unspecified site, without rupture: Secondary | ICD-10-CM | POA: Diagnosis not present

## 2017-03-08 DIAGNOSIS — I7121 Aneurysm of the ascending aorta, without rupture: Secondary | ICD-10-CM

## 2017-03-08 DIAGNOSIS — Z954 Presence of other heart-valve replacement: Secondary | ICD-10-CM

## 2017-03-08 LAB — POCT INR: INR: 2.4

## 2017-03-10 LAB — PSA: PSA: 2.5

## 2017-03-12 ENCOUNTER — Other Ambulatory Visit: Payer: Self-pay | Admitting: Family Medicine

## 2017-03-12 NOTE — Telephone Encounter (Signed)
[facility].  RF request for pantoprazole LOV: 11/24/16 Next ov: 09/20/17 Last written: 02/20/16 #90 w/ 3RF

## 2017-03-17 DIAGNOSIS — R972 Elevated prostate specific antigen [PSA]: Secondary | ICD-10-CM | POA: Diagnosis not present

## 2017-03-24 ENCOUNTER — Encounter: Payer: Self-pay | Admitting: Family Medicine

## 2017-03-29 ENCOUNTER — Ambulatory Visit (INDEPENDENT_AMBULATORY_CARE_PROVIDER_SITE_OTHER): Payer: PPO | Admitting: *Deleted

## 2017-03-29 DIAGNOSIS — Z954 Presence of other heart-valve replacement: Secondary | ICD-10-CM | POA: Diagnosis not present

## 2017-03-29 DIAGNOSIS — Q2543 Congenital aneurysm of aorta: Secondary | ICD-10-CM

## 2017-03-29 DIAGNOSIS — Z5181 Encounter for therapeutic drug level monitoring: Secondary | ICD-10-CM | POA: Diagnosis not present

## 2017-03-29 DIAGNOSIS — Z9889 Other specified postprocedural states: Secondary | ICD-10-CM

## 2017-03-29 DIAGNOSIS — I719 Aortic aneurysm of unspecified site, without rupture: Secondary | ICD-10-CM

## 2017-03-29 DIAGNOSIS — I7121 Aneurysm of the ascending aorta, without rupture: Secondary | ICD-10-CM

## 2017-03-29 LAB — POCT INR: INR: 3.1

## 2017-03-31 ENCOUNTER — Other Ambulatory Visit: Payer: Self-pay

## 2017-03-31 MED ORDER — FENOFIBRATE 145 MG PO TABS: ORAL_TABLET | ORAL | 3 refills | Status: DC

## 2017-03-31 NOTE — Telephone Encounter (Signed)
Fenofibrate refilled per pt request.

## 2017-04-01 ENCOUNTER — Telehealth (HOSPITAL_COMMUNITY): Payer: Self-pay | Admitting: Family Medicine

## 2017-04-01 NOTE — Telephone Encounter (Signed)
S/w Cecille Rubin at [employer] verifying insurance benefits. No Deductible or Co-insurance, Co-pay $15.00 Out of Pocket $3400.00, pt has met $287.12, pt's responsibility $3112.88. Reference #[id number].... KJ

## 2017-04-01 NOTE — Telephone Encounter (Signed)
S/w pt states due to his insurance and not met his OOP, pt declines Cardiac Rehab ... KJ

## 2017-04-05 ENCOUNTER — Other Ambulatory Visit: Payer: Self-pay | Admitting: *Deleted

## 2017-04-05 ENCOUNTER — Telehealth: Payer: Self-pay | Admitting: *Deleted

## 2017-04-05 MED ORDER — METOPROLOL SUCCINATE ER 50 MG PO TB24: 50.0000 mg | ORAL_TABLET | Freq: Every day | ORAL | 3 refills | Status: DC

## 2017-04-05 MED ORDER — PANTOPRAZOLE SODIUM 40 MG PO TBEC: 40.0000 mg | DELAYED_RELEASE_TABLET | Freq: Every day | ORAL | 3 refills | Status: DC

## 2017-04-05 MED ORDER — LOSARTAN POTASSIUM 50 MG PO TABS: 50.0000 mg | ORAL_TABLET | Freq: Every day | ORAL | 3 refills | Status: DC

## 2017-04-05 NOTE — Telephone Encounter (Signed)
[facility].  RF request for pantoprazole LOV: 11/24/16 Next ov: None Last written: 03/12/17 #90 w/ 3RF (Rx sent to [facility] [place])

## 2017-04-06 ENCOUNTER — Other Ambulatory Visit: Payer: Self-pay | Admitting: *Deleted

## 2017-04-07 MED ORDER — WARFARIN SODIUM 5 MG PO TABS: ORAL_TABLET | ORAL | 3 refills | Status: DC

## 2017-04-12 ENCOUNTER — Ambulatory Visit (INDEPENDENT_AMBULATORY_CARE_PROVIDER_SITE_OTHER): Payer: PPO | Admitting: *Deleted

## 2017-04-12 DIAGNOSIS — Z954 Presence of other heart-valve replacement: Secondary | ICD-10-CM

## 2017-04-12 DIAGNOSIS — Z9889 Other specified postprocedural states: Secondary | ICD-10-CM

## 2017-04-12 DIAGNOSIS — I719 Aortic aneurysm of unspecified site, without rupture: Secondary | ICD-10-CM

## 2017-04-12 DIAGNOSIS — Q2543 Congenital aneurysm of aorta: Secondary | ICD-10-CM

## 2017-04-12 DIAGNOSIS — Z5181 Encounter for therapeutic drug level monitoring: Secondary | ICD-10-CM | POA: Diagnosis not present

## 2017-04-12 DIAGNOSIS — I7121 Aneurysm of the ascending aorta, without rupture: Secondary | ICD-10-CM

## 2017-04-12 LAB — POCT INR: INR: 3

## 2017-04-30 ENCOUNTER — Ambulatory Visit (INDEPENDENT_AMBULATORY_CARE_PROVIDER_SITE_OTHER): Payer: PPO

## 2017-04-30 DIAGNOSIS — Z5181 Encounter for therapeutic drug level monitoring: Secondary | ICD-10-CM | POA: Diagnosis not present

## 2017-04-30 DIAGNOSIS — I719 Aortic aneurysm of unspecified site, without rupture: Secondary | ICD-10-CM

## 2017-04-30 DIAGNOSIS — Z954 Presence of other heart-valve replacement: Secondary | ICD-10-CM

## 2017-04-30 DIAGNOSIS — Z9889 Other specified postprocedural states: Secondary | ICD-10-CM | POA: Diagnosis not present

## 2017-04-30 DIAGNOSIS — I7121 Aneurysm of the ascending aorta, without rupture: Secondary | ICD-10-CM

## 2017-04-30 LAB — POCT INR: INR: 2.5

## 2017-05-21 ENCOUNTER — Encounter (INDEPENDENT_AMBULATORY_CARE_PROVIDER_SITE_OTHER): Payer: Self-pay

## 2017-05-21 ENCOUNTER — Ambulatory Visit (INDEPENDENT_AMBULATORY_CARE_PROVIDER_SITE_OTHER): Payer: PPO

## 2017-05-21 DIAGNOSIS — Z9889 Other specified postprocedural states: Secondary | ICD-10-CM

## 2017-05-21 DIAGNOSIS — Z954 Presence of other heart-valve replacement: Secondary | ICD-10-CM

## 2017-05-21 DIAGNOSIS — I719 Aortic aneurysm of unspecified site, without rupture: Secondary | ICD-10-CM | POA: Diagnosis not present

## 2017-05-21 DIAGNOSIS — Z5181 Encounter for therapeutic drug level monitoring: Secondary | ICD-10-CM | POA: Diagnosis not present

## 2017-05-21 DIAGNOSIS — I7121 Aneurysm of the ascending aorta, without rupture: Secondary | ICD-10-CM

## 2017-05-21 HISTORY — PX: TRANSTHORACIC ECHOCARDIOGRAM: SHX275

## 2017-05-21 LAB — POCT INR: INR: 2.8

## 2017-05-25 ENCOUNTER — Encounter: Payer: Self-pay | Admitting: Cardiology

## 2017-06-08 ENCOUNTER — Other Ambulatory Visit: Payer: Self-pay

## 2017-06-08 ENCOUNTER — Ambulatory Visit (HOSPITAL_COMMUNITY): Payer: PPO | Attending: Cardiovascular Disease

## 2017-06-08 DIAGNOSIS — I371 Nonrheumatic pulmonary valve insufficiency: Secondary | ICD-10-CM | POA: Diagnosis not present

## 2017-06-08 DIAGNOSIS — Z87891 Personal history of nicotine dependence: Secondary | ICD-10-CM | POA: Diagnosis not present

## 2017-06-08 DIAGNOSIS — I5022 Chronic systolic (congestive) heart failure: Secondary | ICD-10-CM | POA: Insufficient documentation

## 2017-06-08 DIAGNOSIS — Q231 Congenital insufficiency of aortic valve: Secondary | ICD-10-CM | POA: Diagnosis not present

## 2017-06-08 DIAGNOSIS — I071 Rheumatic tricuspid insufficiency: Secondary | ICD-10-CM | POA: Insufficient documentation

## 2017-06-08 DIAGNOSIS — I341 Nonrheumatic mitral (valve) prolapse: Secondary | ICD-10-CM | POA: Diagnosis not present

## 2017-06-15 ENCOUNTER — Ambulatory Visit: Payer: PPO | Admitting: Cardiology

## 2017-06-16 ENCOUNTER — Encounter: Payer: Self-pay | Admitting: Nurse Practitioner

## 2017-06-16 ENCOUNTER — Ambulatory Visit (INDEPENDENT_AMBULATORY_CARE_PROVIDER_SITE_OTHER): Payer: PPO | Admitting: Pharmacist

## 2017-06-16 ENCOUNTER — Encounter (INDEPENDENT_AMBULATORY_CARE_PROVIDER_SITE_OTHER): Payer: Self-pay

## 2017-06-16 ENCOUNTER — Ambulatory Visit (INDEPENDENT_AMBULATORY_CARE_PROVIDER_SITE_OTHER): Payer: PPO | Admitting: Nurse Practitioner

## 2017-06-16 VITALS — BP 140/70 | HR 65 | Ht 72.0 in | Wt 214.8 lb

## 2017-06-16 DIAGNOSIS — Z954 Presence of other heart-valve replacement: Secondary | ICD-10-CM | POA: Diagnosis not present

## 2017-06-16 DIAGNOSIS — I719 Aortic aneurysm of unspecified site, without rupture: Secondary | ICD-10-CM

## 2017-06-16 DIAGNOSIS — Z9889 Other specified postprocedural states: Secondary | ICD-10-CM | POA: Diagnosis not present

## 2017-06-16 DIAGNOSIS — I10 Essential (primary) hypertension: Secondary | ICD-10-CM | POA: Diagnosis not present

## 2017-06-16 DIAGNOSIS — Z5181 Encounter for therapeutic drug level monitoring: Secondary | ICD-10-CM

## 2017-06-16 DIAGNOSIS — I5022 Chronic systolic (congestive) heart failure: Secondary | ICD-10-CM

## 2017-06-16 DIAGNOSIS — I7121 Aneurysm of the ascending aorta, without rupture: Secondary | ICD-10-CM

## 2017-06-16 LAB — BASIC METABOLIC PANEL
BUN/Creatinine Ratio: 21 (ref 10–24)
BUN: 18 mg/dL (ref 8–27)
CO2: 22 mmol/L (ref 20–29)
Calcium: 9.9 mg/dL (ref 8.6–10.2)
Chloride: 104 mmol/L (ref 96–106)
Creatinine, Ser: 0.86 mg/dL (ref 0.76–1.27)
GFR calc Af Amer: 102 mL/min/{1.73_m2} (ref >59)
GFR calc non Af Amer: 88 mL/min/{1.73_m2} (ref >59)
Glucose: 81 mg/dL (ref 65–99)
Potassium: 4.4 mmol/L (ref 3.5–5.2)
Sodium: 140 mmol/L (ref 134–144)

## 2017-06-16 LAB — CBC
Hematocrit: 40.1 % (ref 37.5–51.0)
Hemoglobin: 13.9 g/dL (ref 13.0–17.7)
MCH: 27.6 pg (ref 26.6–33.0)
MCHC: 34.7 g/dL (ref 31.5–35.7)
MCV: 80 fL (ref 79–97)
Platelets: 216 10*3/uL (ref 150–379)
RBC: 5.04 x10E6/uL (ref 4.14–5.80)
RDW: 14.7 % (ref 12.3–15.4)
WBC: 6.7 10*3/uL (ref 3.4–10.8)

## 2017-06-16 LAB — PRO B NATRIURETIC PEPTIDE: NT-Pro BNP: 206 pg/mL (ref 0–376)

## 2017-06-16 LAB — POCT INR: INR: 2

## 2017-06-16 MED ORDER — FUROSEMIDE 20 MG PO TABS: 20.0000 mg | ORAL_TABLET | Freq: Every day | ORAL | 6 refills | Status: DC | PRN

## 2017-06-16 MED ORDER — LOSARTAN POTASSIUM 100 MG PO TABS: 100.0000 mg | ORAL_TABLET | Freq: Every day | ORAL | 3 refills | Status: DC

## 2017-06-16 NOTE — Patient Instructions (Addendum)
We will be checking the following labs today - BMET, CBC and BNP  INR today   Medication Instructions:    Continue with your current medicines. BUT  I am increasing the Losartan to 100 mg each day - you can take 2 of the 50 mg tablets to = this dose - the RX for the 100 mg is at your drug store  I am giving you Lasix 20 mg to take as needed - this is for swelling that won't go down overnight, weight gain of 2 or more pounds overnight, etc. This has been sent to your drug store    Testing/Procedures To Be Arranged:  N/A  Follow-Up:   See Dr. [name] in 2 to 3 months    Other Special Instructions:   Keep restricting your salt  Stay active    If you need a refill on your cardiac medications before your next appointment, please call your pharmacy.   Call the [place] office at [phone] if you have any questions, problems or concerns.

## 2017-06-16 NOTE — Progress Notes (Signed)
CARDIOLOGY OFFICE NOTE  Date:  06/16/2017    [name] Date of Birth: 02-07-1946 Medical Record #[id number]   PCP:  Tammi Sou, MD  Cardiologist:  [facility]    Chief Complaint  Patient presents with  . Cardiac Valve Problem    Post echo visit - seen for Dr. [name]    History of Present Illness: Kenneth Ryan is a 71 y.o. male who presents today for a follow up visit. This is a 4 month check. Seen for Dr. [name].   He has a history of a bicuspid AV s/p Bentall procedure with mechanical AVR, aortic root replacement and MV repair (10/2016) on chronic anticoagulation with coumadin, HTN, tobacco abuse and recently diagnosed LV dysfunction.   No CAD, EF 55%, severe AR and severe Kenneth prior to procedure on 11/03/16.  He was seen by Dr. [name] in the office back in January and was doing well. 2D ECHO on 02/01/17 showed EF 35% w/ diffuse HK, G1DD, trival AR (prosthesis difficult to visualize), small pericardial effusion. Dr. [name] commented that a reduction in LVEF is common after MV repair but felt like this was lower than expected. Kenneth Ryan was started on Losartan 25mg  daily and Toprol XL was increased to 50mg  daily.   Saw Bonney Leitz, [place] back in February for med titration. He was doing well. ARB was increased.   Comes in today. Here alone. He is doing ok but notes swelling in his feet and ankles over the past several weeks. Not short of breath. He is active but not exercising. Weight is going up. No chest pain. Not dizzy. He was given his echo results previously - discussed again today. He is doing "better" with salt restriction. Does not check his BP at home but has the means to do so. Tolerating his medicines without issue. Avoiding the heat.  Other than the lower extremity edema, he feels like he is doing well.   Past Medical History:  Diagnosis Date  . 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  . Basal cell carcinoma    nose  . Chronic hoarseness    secondary to hx of laryngeal surgery  . GERD (gastroesophageal reflux disease)   . Headache   . Heart murmur   . History of tobacco abuse   . Hypertriglyceridemia   . Increased prostate specific antigen (PSA) velocity 09/2016   saw Dr. Jeffie Pollock 11/18/16: plan is to repeat PSA 6 wks and if still same/up then plan TRUS+ bx.  PSA's declined on recheck x 2 and as of 03/10/17 was 2.5.  Recommended annual PSA's with me, f/u with urol prn.  . 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  Marland Kitchen Left ventricular dysfunction 2018   see echo results in Surgical hx section, 01/2017  . Lesion of vocal cord   . Mitral regurgitation    severe by TEE 08/2016:  MV repair done 11/03/16  . Mitral valve prolapse   . 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  Per CV surg, as of 02/16/17, his INR goal is 1.5-2.5.  Marland Kitchen s/p Bentall procedure for aortic root aneurysm (  [place]) 11/03/2016  . 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   Complex valvuloplasty including artificial Gore-tex neochord placement x6 and 28 mm Sorin Memo 3D ring annuloplasty    Past Surgical History:  Procedure Laterality Date  . BENTALL PROCEDURE N/A 11/03/2016   Procedure: BENTALL PROCEDURE AORTIC ROOT REPLACEMENT + mechanical AV;  Surgeon: Rexene Alberts, MD;  Location: [place];  Service: Open Heart Surgery;  Laterality: N/A;  . CARDIAC CATHETERIZATION N/A 09/29/2016   Clean coronaries, EF 60-65%.  Procedure: Right/Left Heart Cath and Coronary Angiography;  Surgeon: Jettie Booze, MD;  Location: [place] [facility];  Service: Cardiovascular;  Laterality: N/A;  .  CHOLECYSTECTOMY  1980  . COLONOSCOPY  2003 and 2013   Both normal.  . DIRECT LARYNGOSCOPY Left 10/17/2015   Procedure: MICRO DIRECT LARYNGOSCOPY WITH FROZEN SECTION;  Surgeon: Jodi Marble, MD;  Location: [place];  Service: ENT;  Laterality: Left;  . MITRAL VALVE REPAIR N/A 11/03/2016   Procedure: MITRAL VALVE REPAIR (MVR);  Surgeon: Rexene Alberts, MD;  Location: [place];  Service: Open Heart Surgery;  Laterality: N/A;  . PERIPHERAL VASCULAR CATHETERIZATION N/A 09/29/2016   Procedure: Abdominal Aortogram;  Surgeon: Jettie Booze, MD;  Location: [place] CV [facility];  Service: Cardiovascular;  Laterality: N/A;  . PERIPHERAL VASCULAR CATHETERIZATION N/A 09/29/2016   Procedure: Thoracic Aortogram;  Surgeon: Jettie Booze, MD;  Location: [place] CV [facility];  Service: Cardiovascular;  Laterality: N/A;  . TEE WITHOUT CARDIOVERSION N/A 09/17/2016   Procedure: TRANSESOPHAGEAL ECHOCARDIOGRAM (TEE);  Surgeon: Jerline Pain, MD;  Location: [place];  Service: Cardiovascular;  Laterality: N/A;  . TEE WITHOUT CARDIOVERSION N/A 11/03/2016   Procedure: TRANSESOPHAGEAL ECHOCARDIOGRAM (TEE);  Surgeon: Rexene Alberts, MD;  Location: [place];  Service: Open Heart Surgery;  Laterality: N/A;  . TONSILECTOMY/ADENOIDECTOMY WITH MYRINGOTOMY  1960  . TRANSESOPHAGEAL ECHOCARDIOGRAM  09/17/2016   Severe Kenneth with ruptured chordae.  Mod/severe AR w/functional bicuspid AV.  EF 45%.  . TRANSTHORACIC ECHOCARDIOGRAM  07/2015   Bicuspid aortic valve + mod mitral regurg  . TRANSTHORACIC ECHOCARDIOGRAM  02/01/2017   (Post aortic root/valve surgery): EF 35%, diffuse hypokinesis, LV dilatation, grade I DD. Cardiologist added ARB and increased his BB dose.  . Vocal cord biopsy  09/2015   No atypia or inflammation.  Some fibrous changes that are possibly from hx of radiation therapy.     Medications: Current Meds  Medication Sig  . acetaminophen (TYLENOL) 500 MG tablet Take 500-1,000 mg by mouth every 6 (six) hours  as needed for mild pain (depends on pain level if takes 500 mg or 1000 mg).   Marland Kitchen aspirin EC 81 MG EC tablet Take 1 tablet (81 mg total) by mouth daily.  . fenofibrate (TRICOR) 145 MG tablet TAKE 1 TABLET(145 MG) BY MOUTH DAILY  . metoprolol succinate (TOPROL-XL) 50 MG 24 hr tablet Take 1 tablet (50 mg total) by mouth daily.  . Multiple Vitamins-Minerals (MULTIVITAMIN PO) Take 1 tablet by mouth daily.   . pantoprazole (PROTONIX) 40 MG tablet Take 1 tablet (40 mg total) by mouth daily.  Marland Kitchen warfarin (COUMADIN) 5 MG tablet Take as directed by [facility]  . [DISCONTINUED] losartan (COZAAR) 50 MG tablet Take 1 tablet (50 mg total) by mouth daily.    Allergies: No Known Allergies  Social History: The patient  reports that he quit smoking about 37 years ago. He has never used smokeless tobacco. He reports that he drinks alcohol. He reports that he does not  use drugs.   Family History: The patient's family history includes Breast cancer in his mother; Diabetes in his sister; Heart disease in his father.   Review of Systems: Please see the history of present illness.   Otherwise, the review of systems is positive for none.   All other systems are reviewed and negative.   Physical Exam: VS:  BP 140/70 (BP Location: Left Arm, Patient Position: Sitting, Cuff Size: Normal)   Pulse 65   Ht 6' (1.829 m)   Wt 214 lb 12.8 oz (97.4 kg)   SpO2 97% Comment: at rest  BMI 29.13 kg/m  .  BMI Body mass index is 29.13 kg/m.  Wt Readings from Last 3 Encounters:  06/16/17 214 lb 12.8 oz (97.4 kg)  02/17/17 208 lb 1.9 oz (94.4 kg)  02/15/17 200 lb (90.7 kg)    General: Pleasant. Well developed, well nourished and in no acute distress.  His weight is up - 14 pounds since February. HEENT: Normal.  Neck: Supple, no JVD, carotid bruits, or masses noted.  Cardiac: Regular rate and rhythm. Valve crisp. 1+ edema biilaterally.  Respiratory:  Lungs are clear to auscultation bilaterally with normal work of  breathing.  GI: Soft and nontender.  MS: No deformity or atrophy. Gait and ROM intact.  Skin: Warm and dry. Color is normal.  Neuro:  Strength and sensation are intact and no gross focal deficits noted.  Psych: Alert, appropriate and with normal affect.   LABORATORY DATA:  EKG:  EKG is not ordered today.  Lab Results  Component Value Date   WBC 7.2 11/23/2016   HGB 11.6 (L) 11/23/2016   HCT 35.0 (L) 11/23/2016   PLT 410 (H) 11/23/2016   GLUCOSE 100 (H) 02/17/2017   CHOL 135 09/24/2016   TRIG 67.0 09/24/2016   HDL 40.30 09/24/2016   LDLCALC 82 09/24/2016   ALT 20 10/30/2016   AST 25 10/30/2016   NA 141 02/17/2017   K 4.3 02/17/2017   CL 102 02/17/2017   CREATININE 0.97 02/17/2017   BUN 15 02/17/2017   CO2 23 02/17/2017   TSH 1.71 11/24/2016   PSA 2.50 03/10/2017   INR 2.8 05/21/2017   HGBA1C 5.5 10/30/2016     BNP (last 3 results) No results for input(s): BNP in the last 8760 hours.  ProBNP (last 3 results) No results for input(s): PROBNP in the last 8760 hours.   Other Studies Reviewed Today:  Echo Study Conclusions 05/2017  - Left ventricle: The cavity size was normal. Systolic function was   mildly reduced. The estimated ejection fraction was in the range   of 45% to 50%. Features are consistent with a pseudonormal left   ventricular filling pattern, with concomitant abnormal relaxation   and increased filling pressure (grade 2 diastolic dysfunction). - Aortic valve: A mechanical prosthesis was present. - Mitral valve: Prior procedures included surgical repair.   2D ECHO: 02/01/2017 Study Conclusions - Left ventricle: LVEF is approximately 35% with diffuse hypokinesis. The cavity size was severely dilated. Wall thickness was normal. Doppler parameters are consistent with abnormal left ventricular relaxation (grade 1 diastolic dysfunction). - Aortic valve: AV prosthesis is difficult to see. Peak and mean gradients through the valve are 11  and 5 mm Hg respectively. There was trivial regurgitation. - Mitral valve: Moderately calcified annulus. Moderately thickened leaflets . - Pericardium, extracardiac: A small pericardial effusion was identified.  09/29/16 Abdominal Aortogram  Right/Left Heart Cath and Coronary Angiography  Thoracic Aortogram  Conclusion  The left ventricular ejection fraction is 55-65% by visual estimate. LV was visualized due to severe AI on supravalvular aortogram.  Hemodynamic findings consistent with mild pulmonary hypertension.  No significant CAD.  Prominent V waves consistent with severe mitral regurgitation.  No renal artery stenosis noted. No AAA.  There is severe (4+) aortic regurgitation.  Continue with plans for referral to Dr. Roxy Manns for valve surgery.     TEE 11/03/16 Conclusions  Result status: Final result   Left ventricle: Cavity is moderately dilated. End diastolic diameter of 6 cm. Concentric hypertrophy. 1.2cm inferior base thickness. LV systolic function is mildly to moderately reduced with an EF of 40-45%. There are no obvious wall motion abnormalities. No thrombus present. No mass present.  Septum: No Patent Foramen Ovale present.  Left atrium: Patent foramen ovale not present.  Left atrium: No spontaneous echo contrast.  Aortic valve: The valve is bicuspid and has systolic doming. Moderate valve thickening present. Moderate valve calcification present. Mildly decreased leaflet separation. Mild stenosis. Severe regurgitation. Holodiastolic flow reversal in the descending thoracic aorta. No AV vegetation.  Aorta: The aortic root is dilated at the sinuses of Valsalva. The ascending aorta is dilated.  Mitral valve: The mitral annulus is dilated. The A2 segment is flail with connected ruptured chordae from the tip of the papillary muscle. No leaflet thickening and calcification present. Ruptured chordae causing anterior leaflet incompetence. Severe  regurgitation. Flail portion of the anterior leaflet involving the middle segment.  Right ventricle: Normal cavity size, wall thickness and ejection fraction.  Tricuspid valve: Trace regurgitation. The tricuspid valve regurgitation jet is central.  Mitral valve: Mild stenosis.  Aorta: Graft present.     Assessment/Plan:  1. Bicuspid AV s/p Bentall Procedure with mechanical AVR, aortic root replacement and mitral valve repair from 2017: he is doing well clinically. Remains on coumadin. INR today. His echo shows stable replacement and he has now had improvement in LV function.   2. HTN: BP recheck by me is 135/80. ARB is increased again today. Lab today.   3. LV dysfunction: EF has improved by most recent echo. He is now however having lower extremity edema. We have plenty of BP room. Will increase the ARB. Adding low dose Lasix to take just prn. Lab today. Reminded about salt restriction, weights, etc.    4. Tobacco abuse: he no longer smokes.   Current medicines are reviewed with the patient today.  The patient does not have concerns regarding medicines other than what has been noted above.  The following changes have been made:  See above.  Labs/ tests ordered today include:    Orders Placed This Encounter  Procedures  . Basic metabolic panel  . CBC  . Pro b natriuretic peptide (BNP)     Disposition:   FU with Dr. [name] in 2 to 3 months.   Patient is agreeable to this plan and will call if any problems develop in the interim.   SignedTruitt Merle, NP  06/16/2017 11:25 AM  [facility] [address] [place], [place]  [address] Phone: [phone] Fax: [phone]

## 2017-06-27 ENCOUNTER — Encounter: Payer: Self-pay | Admitting: Family Medicine

## 2017-07-14 ENCOUNTER — Ambulatory Visit (INDEPENDENT_AMBULATORY_CARE_PROVIDER_SITE_OTHER): Payer: PPO | Admitting: *Deleted

## 2017-07-14 DIAGNOSIS — Z5181 Encounter for therapeutic drug level monitoring: Secondary | ICD-10-CM | POA: Diagnosis not present

## 2017-07-14 DIAGNOSIS — I719 Aortic aneurysm of unspecified site, without rupture: Secondary | ICD-10-CM

## 2017-07-14 DIAGNOSIS — Z954 Presence of other heart-valve replacement: Secondary | ICD-10-CM

## 2017-07-14 DIAGNOSIS — Z9889 Other specified postprocedural states: Secondary | ICD-10-CM | POA: Diagnosis not present

## 2017-07-14 DIAGNOSIS — I7121 Aneurysm of the ascending aorta, without rupture: Secondary | ICD-10-CM

## 2017-07-14 LAB — POCT INR: INR: 2.5

## 2017-07-23 ENCOUNTER — Other Ambulatory Visit: Payer: Self-pay | Admitting: Cardiology

## 2017-08-11 ENCOUNTER — Ambulatory Visit (INDEPENDENT_AMBULATORY_CARE_PROVIDER_SITE_OTHER): Payer: PPO

## 2017-08-11 DIAGNOSIS — Z5181 Encounter for therapeutic drug level monitoring: Secondary | ICD-10-CM

## 2017-08-11 DIAGNOSIS — I719 Aortic aneurysm of unspecified site, without rupture: Secondary | ICD-10-CM | POA: Diagnosis not present

## 2017-08-11 DIAGNOSIS — Z954 Presence of other heart-valve replacement: Secondary | ICD-10-CM | POA: Diagnosis not present

## 2017-08-11 DIAGNOSIS — Z9889 Other specified postprocedural states: Secondary | ICD-10-CM

## 2017-08-11 DIAGNOSIS — I7121 Aneurysm of the ascending aorta, without rupture: Secondary | ICD-10-CM

## 2017-08-11 LAB — POCT INR: INR: 2.4

## 2017-09-07 ENCOUNTER — Ambulatory Visit (INDEPENDENT_AMBULATORY_CARE_PROVIDER_SITE_OTHER): Payer: PPO

## 2017-09-07 DIAGNOSIS — Z23 Encounter for immunization: Secondary | ICD-10-CM

## 2017-09-15 ENCOUNTER — Ambulatory Visit (INDEPENDENT_AMBULATORY_CARE_PROVIDER_SITE_OTHER): Payer: PPO | Admitting: *Deleted

## 2017-09-15 ENCOUNTER — Encounter: Payer: Self-pay | Admitting: Cardiology

## 2017-09-15 ENCOUNTER — Ambulatory Visit (INDEPENDENT_AMBULATORY_CARE_PROVIDER_SITE_OTHER): Payer: PPO | Admitting: Cardiology

## 2017-09-15 VITALS — BP 120/68 | HR 66 | Ht 72.0 in | Wt 219.0 lb

## 2017-09-15 DIAGNOSIS — I719 Aortic aneurysm of unspecified site, without rupture: Secondary | ICD-10-CM

## 2017-09-15 DIAGNOSIS — Z954 Presence of other heart-valve replacement: Secondary | ICD-10-CM | POA: Diagnosis not present

## 2017-09-15 DIAGNOSIS — Z5181 Encounter for therapeutic drug level monitoring: Secondary | ICD-10-CM | POA: Diagnosis not present

## 2017-09-15 DIAGNOSIS — I10 Essential (primary) hypertension: Secondary | ICD-10-CM

## 2017-09-15 DIAGNOSIS — I5022 Chronic systolic (congestive) heart failure: Secondary | ICD-10-CM | POA: Diagnosis not present

## 2017-09-15 DIAGNOSIS — I7121 Aneurysm of the ascending aorta, without rupture: Secondary | ICD-10-CM

## 2017-09-15 DIAGNOSIS — Z9889 Other specified postprocedural states: Secondary | ICD-10-CM | POA: Diagnosis not present

## 2017-09-15 DIAGNOSIS — I77810 Thoracic aortic ectasia: Secondary | ICD-10-CM

## 2017-09-15 LAB — POCT INR: INR: 2.6

## 2017-09-15 NOTE — Patient Instructions (Signed)
Medication Instructions:  The current medical regimen is effective;  continue present plan and medications.  Follow-Up: Follow up with Truitt Merle, NP in 6 months.  Follow up in 1 year with Dr. [name].  You will receive a letter in the mail 2 months before you are due.  Please call us when you receive this letter to schedule your follow up appointment.  If you need a refill on your cardiac medications before your next appointment, please call your pharmacy.  Thank you for choosing [facility]!!

## 2017-09-15 NOTE — Progress Notes (Signed)
Cardiology Office Note    Date:  09/15/2017   ID:  Kenneth Ryan, DOB [birth year], MRN [id number]  PCP:  Tammi Sou, MD  Cardiologist:   Candee Furbish, MD     History of Present Illness:  Kenneth Ryan is a 71 y.o. male with mechanical aortic valve, aortic root replacement, Bentall, and mitral valve repair On 10/2016 on chronic anticoagulation secondary to congenital bicuspid aortic valve. EF was 35% postoperatively. Diffuse hypokinesis.  No CAD, EF 55%, severe AR and severe MR prior to procedure on 11/03/16.  Sweats  Treadmill.   Bendopnea - took only one lasix. [place] vertigo when gturning necl   Past Medical History:  Diagnosis Date  . 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  . Basal cell carcinoma    nose  . Chronic hoarseness    secondary to hx of laryngeal surgery  . GERD (gastroesophageal reflux disease)   . Headache   . Heart murmur   . History of tobacco abuse   . Hypertriglyceridemia   . Increased prostate specific antigen (PSA) velocity 09/2016   saw Dr. Jeffie Pollock 11/18/16: plan is to repeat PSA 6 wks and if still same/up then plan TRUS+ bx.  PSA's declined on recheck x 2 and as of 03/10/17 was 2.5.  Recommended annual PSA's with me, f/u with urol prn.  . Laryngeal cancer [facility]) 2002   Surgery and radiation---He had post treatment surveillance x 12 yrs and was officially "released" from f/u in summer 2014.  Marland Kitchen Left ventricular dysfunction 2018   see echo results in Surgical hx section, 01/2017 and 05/2017.  Marland Kitchen Lesion of vocal cord   . Mitral regurgitation    severe by TEE 08/2016:  MV repair done 11/03/16  . Mitral valve prolapse   . 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  Per CV surg, as of 02/16/17, his INR goal is 1.5-2.5.  Marland Kitchen s/p Bentall procedure for aortic root aneurysm ([place]) 11/03/2016  . 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   Complex valvuloplasty including artificial Gore-tex neochord placement x6 and 28 mm Sorin Memo 3D ring annuloplasty    Past Surgical History:  Procedure Laterality Date  . BENTALL PROCEDURE N/A 11/03/2016   Procedure: BENTALL PROCEDURE AORTIC ROOT REPLACEMENT + mechanical AV;  Surgeon: Rexene Alberts, MD;  Location: [place];  Service: Open Heart Surgery;  Laterality: N/A;  . CARDIAC CATHETERIZATION N/A 09/29/2016   Clean coronaries, EF 60-65%.  Procedure: Right/Left Heart Cath and Coronary Angiography;  Surgeon: Jettie Booze, MD;  Location: [place] CV [facility];  Service: Cardiovascular;  Laterality: N/A;  . CHOLECYSTECTOMY  1980  . COLONOSCOPY  2003 and 2013   Both normal.  . DIRECT LARYNGOSCOPY Left 10/17/2015   Procedure: MICRO DIRECT LARYNGOSCOPY WITH FROZEN SECTION;  Surgeon: Jodi Marble, MD;  Location: [place];  Service: ENT;  Laterality: Left;  . MITRAL VALVE REPAIR N/A 11/03/2016   Procedure: MITRAL VALVE REPAIR (MVR);  Surgeon: Rexene Alberts, MD;  Location: [place];  Service: Open Heart Surgery;  Laterality: N/A;  . PERIPHERAL VASCULAR CATHETERIZATION N/A 09/29/2016   Procedure: Abdominal Aortogram;  Surgeon: Jettie Booze, MD;  Location: [place] CV LAB;  Service: Cardiovascular;  Laterality: N/A;  .  PERIPHERAL VASCULAR CATHETERIZATION N/A 09/29/2016   Procedure: Thoracic Aortogram;  Surgeon: Jettie Booze, MD;  Location: [place] CV [facility];  Service: Cardiovascular;  Laterality: N/A;  . TEE WITHOUT CARDIOVERSION N/A 09/17/2016   Procedure: TRANSESOPHAGEAL ECHOCARDIOGRAM (TEE);  Surgeon: Jerline Pain, MD;  Location: [place];  Service: Cardiovascular;  Laterality: N/A;  . TEE WITHOUT CARDIOVERSION N/A 11/03/2016   Procedure: TRANSESOPHAGEAL ECHOCARDIOGRAM (TEE);  Surgeon: Rexene Alberts,  MD;  Location: [place];  Service: Open Heart Surgery;  Laterality: N/A;  . TONSILECTOMY/ADENOIDECTOMY WITH MYRINGOTOMY  1960  . 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  Mod/severe AR w/functional bicuspid AV.  EF 45%.  . TRANSTHORACIC ECHOCARDIOGRAM  07/2015   Bicuspid aortic valve + mod mitral regurg  . TRANSTHORACIC ECHOCARDIOGRAM  02/01/2017   (Post aortic root/valve surgery): EF 35%, diffuse hypokinesis, LV dilatation, grade I DD. Cardiologist added ARB and increased his BB dose.  Lasix prn added 06/2017 due to some LE edema.  . TRANSTHORACIC ECHOCARDIOGRAM  05/2017   LVEF 45-50%.  Grd II DD.  Marland Kitchen Vocal cord biopsy  09/2015   No atypia or inflammation.  Some fibrous changes that are possibly from hx of radiation therapy.    Current Medications: Outpatient Medications Prior to Visit  Medication Sig Dispense Refill  . acetaminophen (TYLENOL) 500 MG tablet Take 500-1,000 mg by mouth every 6 (six) hours as needed for mild pain (depends on pain level if takes 500 mg or 1000 mg).     Marland Kitchen aspirin EC 81 MG EC tablet Take 1 tablet (81 mg total) by mouth daily.    . fenofibrate (TRICOR) 145 MG tablet TAKE 1 TABLET(145 MG) BY MOUTH DAILY 90 tablet 3  . metoprolol succinate (TOPROL-XL) 50 MG 24 hr tablet Take 1 tablet (50 mg total) by mouth daily. 90 tablet 3  . Multiple Vitamins-Minerals (MULTIVITAMIN PO) Take 1 tablet by mouth daily.     . pantoprazole (PROTONIX) 40 MG tablet Take 1 tablet (40 mg total) by mouth daily. 90 tablet 3  . warfarin (COUMADIN) 5 MG tablet TAKE AS DIRECTED BY COUMADIN CLINIC 30 tablet 3  . furosemide (LASIX) 20 MG tablet Take 1 tablet (20 mg total) by mouth daily as needed for fluid or edema (weight gain of 2 or more pounds overnight). 30 tablet 6  . losartan (COZAAR) 100 MG tablet Take 1 tablet (100 mg total) by mouth daily. 90 tablet 3   No facility-administered medications prior to visit.      Allergies:   Patient has no known  allergies.   Social History   Social History  . Marital status: Married    Spouse name: N/A  . Number of children: N/A  . Years of education: N/A   Social History Main Topics  . Smoking status: Former Smoker    Quit date: 07/22/1979  . Smokeless tobacco: Never Used  . Alcohol use Yes     Comment: rare  . Drug use: No  . Sexual activity: Not Asked   Other Topics Concern  . None   Social History Narrative   Married, one son deceased 04/08/2011.   Relocated from [place] to [facility] 06/2013 to be near grandchildren.   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   Smoker 20 pack-yr hx: quit about 1980.   Alcohol: 3 bottles of beer per week avg.     Drugs: none              Family History:  The  patient's family history includes Breast cancer in his mother; Diabetes in his sister; Heart disease in his father.   ROS:   Please see the history of present illness.    ROS All other systems reviewed and are negative.   PHYSICAL EXAM:   VS:  BP 120/68   Pulse 66   Ht 6' (1.829 m)   Wt 219 lb (99.3 kg)   BMI 29.70 kg/m    GEN: Well nourished, well developed, in no acute distress  HEENT: normal  Neck: no JVD, carotid bruits, or masses Cardiac: RRR; sharp s2, no murmurs, rubs, or gallops,no edema  Respiratory:  clear to auscultation bilaterally, normal work of breathing GI: soft, nontender, nondistended, + BS MS: no deformity or atrophy  Skin: warm and dry, no rash Neuro:  Alert and Oriented x 3, Strength and sensation are intact Psych: euthymic mood, full affect  Wt Readings from Last 3 Encounters:  09/15/17 219 lb (99.3 kg)  06/16/17 214 lb 12.8 oz (97.4 kg)  02/17/17 208 lb 1.9 oz (94.4 kg)      Studies/Labs Reviewed:   EKG:  Prior, NSR with PAC, personally viewed.  Recent Labs: 10/30/2016: ALT 20 11/04/2016: Magnesium 2.2 11/24/2016: TSH 1.71 06/16/2017: BUN 18; Creatinine, Ser 0.86; Hemoglobin 13.9; NT-Pro BNP 206; Platelets 216; Potassium 4.4; Sodium 140   Lipid Panel      Component Value Date/Time   CHOL 135 09/24/2016 0847   TRIG 67.0 09/24/2016 0847   HDL 40.30 09/24/2016 0847   CHOLHDL 3 09/24/2016 0847   VLDL 13.4 09/24/2016 0847   LDLCALC 82 09/24/2016 0847    Additional studies/ records that were reviewed today include:  Hospital records, cath, labs, ecg echo reviewed.   ECHO 06/08/17:  - Left ventricle: The cavity size was normal. Systolic function was   mildly reduced. The estimated ejection fraction was in the range   of 45% to 50%. Features are consistent with a pseudonormal left   ventricular filling pattern, with concomitant abnormal relaxation   and increased filling pressure (grade 2 diastolic dysfunction). - Aortic valve: A mechanical prosthesis was present. - Mitral valve: Prior procedures included surgical repair.  ASSESSMENT:    1. S/P mitral valve repair   2. S/P aortic valve replacement with metallic valve   3. Essential hypertension   4. 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 Chronic systolic CHF (congestive heart failure) ([place])   6. Dilated aortic root ([facility])      PLAN:  In order of problems listed above:  Bentall, mechanical aortic valve 2017  - Continue secondary prevention.  - dental prophylaxis  - Sharp S2 click noted on exam.  Mitral valve repair  - same as above  Chronic anticoagulation  - INR 2-3, mechanical AVR.   - checked here, doing well  Essential hypertension  - Very well controlled on increased losartan from Truitt Merle, NP.  Non ischemic cardiomyopathy, dilated cardiomyopathy  - Toprol, losartan  . - Low-dose Lasix when necessary. He has taken sparingly. His ejection fraction has improved in his most recent echocardiogram: 45-50.  - He does have some symptoms of bendopnea but this is likely secondary to restrictive lung physiology when bending over given his protuberant abdomen.   - No orthopnea, no significant shortness of breath with activity.  - He still has some diaphoresis which he feels as  though this is slightly out of proportion to what he is expecting one walking on his treadmill.  Medication Adjustments/Labs and Tests Ordered: Current  medicines are reviewed at length with the patient today.  Concerns regarding medicines are outlined above.  Medication changes, Labs and Tests ordered today are listed in the Patient Instructions below. Patient Instructions  Medication Instructions:  The current medical regimen is effective;  continue present plan and medications.  Follow-Up: Follow up with Truitt Merle, NP in 6 months.  Follow up in 1 year with Dr. [name].  You will receive a letter in the mail 2 months before you are due.  Please call us when you receive this letter to schedule your follow up appointment.  If you need a refill on your cardiac medications before your next appointment, please call your pharmacy.  Thank you for choosing [facility]!!        Signed, Candee Furbish, MD  09/15/2017 2:21 PM    [address], [place],   [address] Phone: [phone]; Fax: [phone]

## 2017-09-17 ENCOUNTER — Other Ambulatory Visit: Payer: Self-pay | Admitting: *Deleted

## 2017-09-17 MED ORDER — WARFARIN SODIUM 5 MG PO TABS: ORAL_TABLET | ORAL | 3 refills | Status: DC

## 2017-09-17 NOTE — Telephone Encounter (Signed)
Pharmacy requests ninety days. 

## 2017-09-20 ENCOUNTER — Ambulatory Visit (INDEPENDENT_AMBULATORY_CARE_PROVIDER_SITE_OTHER): Payer: PPO | Admitting: Family Medicine

## 2017-09-20 ENCOUNTER — Encounter: Payer: Self-pay | Admitting: Family Medicine

## 2017-09-20 VITALS — BP 103/63 | HR 61 | Temp 97.5°F | Resp 16 | Ht 72.0 in | Wt 219.0 lb

## 2017-09-20 DIAGNOSIS — Z0000 Encounter for general adult medical examination without abnormal findings: Secondary | ICD-10-CM | POA: Diagnosis not present

## 2017-09-20 DIAGNOSIS — R972 Elevated prostate specific antigen [PSA]: Secondary | ICD-10-CM | POA: Diagnosis not present

## 2017-09-20 DIAGNOSIS — M7542 Impingement syndrome of left shoulder: Secondary | ICD-10-CM | POA: Diagnosis not present

## 2017-09-20 LAB — PSA, MEDICARE: PSA: 2.73 ng/mL (ref 0.10–4.00)

## 2017-09-20 MED ORDER — ZOSTER VAC RECOMB ADJUVANTED 50 MCG/0.5ML IM SUSR: 0.5000 mL | Freq: Once | INTRAMUSCULAR | 1 refills | Status: AC

## 2017-09-20 MED ORDER — ZOSTER VAC RECOMB ADJUVANTED 50 MCG/0.5ML IM SUSR: 0.5000 mL | Freq: Once | INTRAMUSCULAR | 1 refills | Status: DC

## 2017-09-20 NOTE — Progress Notes (Signed)
Office Note 09/20/2017  CC:  Chief Complaint  Patient presents with  . Annual Exam    Pt is not fasting.     HPI:  Kenneth Ryan is a 71 y.o. White male who is here for annual health maintenance exam.  Eye exam: coming up 12/01/17. Dental: preventatives UTD. Exercise: just bought a treadmill. Diet: not working on anything.  Complaint today: gradual onset L shoulder pain, started about 1 yr ago.  No trauma or strain recalled. Hurts when he aBducts it and some with ER.  Denies weakness or paresthesias or radiation of the pain.  No neck pain. Has not taken anything for the pain, no ice/heat either.  Doesn't hurt when he sleeps on L shoulder.   Past Medical History:  Diagnosis Date  . 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  . Basal cell carcinoma    nose  . Chronic hoarseness    secondary to hx of laryngeal surgery  . GERD (gastroesophageal reflux disease)   . Headache   . Heart murmur   . History of tobacco abuse   . Hypertriglyceridemia   . Increased prostate specific antigen (PSA) velocity 09/2016   saw Dr. Jeffie Pollock 11/18/16: plan is to repeat PSA 6 wks and if still same/up then plan TRUS+ bx.  PSA's declined on recheck x 2 and as of 03/10/17 was 2.5.  Recommended annual PSA's with me, f/u with urol prn.  . 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  Marland Kitchen Left ventricular dysfunction 2018   see echo results in Surgical hx section, 01/2017 and 05/2017.  Marland Kitchen Lesion of vocal cord   . Mitral regurgitation    severe by TEE 08/2016:  MV repair done 11/03/16  . Mitral valve prolapse   . 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  Per CV surg, as of 02/16/17, his INR goal is 1.5-2.5.  Marland Kitchen s/p Bentall procedure for aortic root aneurysm ([place]) 11/03/2016  . 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   Complex valvuloplasty including artificial Gore-tex neochord placement x6 and 28 mm Sorin Memo 3D ring annuloplasty    Past Surgical History:  Procedure Laterality Date  . BENTALL PROCEDURE N/A 11/03/2016   Procedure: BENTALL PROCEDURE AORTIC ROOT REPLACEMENT + mechanical AV;  Surgeon: Rexene Alberts, MD;  Location: [place];  Service: Open Heart Surgery;  Laterality: N/A;  . CARDIAC CATHETERIZATION N/A 09/29/2016   Clean coronaries, EF 60-65%.  Procedure: Right/Left Heart Cath and Coronary Angiography;  Surgeon: Jettie Booze, MD;  Location: [place] CV LAB;  Service: Cardiovascular;  Laterality: N/A;  . CHOLECYSTECTOMY  1980  . COLONOSCOPY  2003 and 2013   Both normal.  . DIRECT LARYNGOSCOPY Left 10/17/2015   Procedure: MICRO DIRECT LARYNGOSCOPY WITH FROZEN SECTION;  Surgeon: Jodi Marble, MD;  Location: [place];  Service: ENT;  Laterality: Left;  . MITRAL VALVE REPAIR N/A 11/03/2016   Procedure: MITRAL VALVE REPAIR (MVR);  Surgeon: Rexene Alberts, MD;  Location: [place];  Service: Open Heart Surgery;  Laterality: N/A;  . PERIPHERAL VASCULAR CATHETERIZATION N/A 09/29/2016   Procedure: Abdominal Aortogram;  Surgeon: Jettie Booze, MD;  Location: [place] CV LAB;  Service: Cardiovascular;  Laterality: N/A;  . PERIPHERAL VASCULAR CATHETERIZATION N/A 09/29/2016   Procedure: Thoracic Aortogram;  Surgeon: Jettie Booze, MD;  Location: [place] CV LAB;  Service: Cardiovascular;  Laterality: N/A;  . TEE WITHOUT CARDIOVERSION N/A 09/17/2016   Procedure: TRANSESOPHAGEAL ECHOCARDIOGRAM (TEE);  Surgeon: Jerline Pain, MD;  Location: [place];  Service: Cardiovascular;  Laterality: N/A;  . TEE WITHOUT CARDIOVERSION N/A 11/03/2016   Procedure: TRANSESOPHAGEAL ECHOCARDIOGRAM  (TEE);  Surgeon: Rexene Alberts, MD;  Location: [place];  Service: Open Heart Surgery;  Laterality: N/A;  . TONSILECTOMY/ADENOIDECTOMY WITH MYRINGOTOMY  1960  . 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  Mod/severe AR w/functional bicuspid AV.  EF 45%.  . TRANSTHORACIC ECHOCARDIOGRAM  07/2015   Bicuspid aortic valve + mod mitral regurg  . TRANSTHORACIC ECHOCARDIOGRAM  02/01/2017   (Post aortic root/valve surgery): EF 35%, diffuse hypokinesis, LV dilatation, grade I DD. Cardiologist added ARB and increased his BB dose.  Lasix prn added 06/2017 due to some LE edema.  . TRANSTHORACIC ECHOCARDIOGRAM  05/2017   LVEF 45-50%.  Grd II DD.  Marland Kitchen Vocal cord biopsy  09/2015   No atypia or inflammation.  Some fibrous changes that are possibly from hx of radiation therapy.    Family History  Problem Relation Age of Onset  . Heart disease Father   . Breast cancer Mother   . Diabetes Sister     Social History   Social History  . Marital status: Married    Spouse name: N/A  . Number of children: N/A  . Years of education: N/A   Occupational History  . Not on file.   Social History Main Topics  . Smoking status: Former Smoker    Quit date: 07/22/1979  . Smokeless tobacco: Never Used  . Alcohol use Yes     Comment: rare  . Drug use: No  . Sexual activity: Not on file   Other Topics Concern  . Not on file   Social History Narrative   Married, one son deceased Mar 29, 2011.   Relocated from [place] to [facility] 06/2013 to be near grandchildren.   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   Smoker 20 pack-yr hx: quit about 1980.   Alcohol: 3 bottles of beer per week avg.     Drugs: none             Outpatient Medications Prior to Visit  Medication Sig Dispense Refill  . acetaminophen (TYLENOL) 500 MG tablet Take 500-1,000 mg by mouth every 6 (six) hours as needed for mild pain (depends on pain level if takes 500 mg or 1000 mg).     Marland Kitchen aspirin EC 81 MG EC tablet Take 1 tablet  (81 mg total) by mouth daily.    . fenofibrate (TRICOR) 145 MG tablet TAKE 1 TABLET(145 MG) BY MOUTH DAILY 90 tablet 3  . furosemide (LASIX) 20 MG tablet Take 20 mg by mouth daily as needed for fluid or edema (2 or more pounds).    . losartan (COZAAR) 100 MG tablet Take 100 mg by mouth daily.    . metoprolol succinate (TOPROL-XL) 50 MG 24 hr tablet Take 1 tablet (50 mg total) by mouth daily. 90 tablet 3  . Multiple Vitamins-Minerals (MULTIVITAMIN PO) Take 1 tablet by mouth daily.     . pantoprazole (PROTONIX) 40 MG tablet Take 1 tablet (40 mg total) by mouth daily. 90 tablet 3  . warfarin (COUMADIN) 5  MG tablet TAKE AS DIRECTED BY COUMADIN CLINIC 30 tablet 3   No facility-administered medications prior to visit.     No Known Allergies  ROS Review of Systems  Constitutional: Negative for appetite change, chills, fatigue and fever.  HENT: Negative for congestion, dental problem, ear pain and sore throat.   Eyes: Negative for discharge, redness and visual disturbance.  Respiratory: Negative for cough, chest tightness, shortness of breath and wheezing.   Cardiovascular: Positive for leg swelling (chronic,mild). Negative for chest pain and palpitations.  Gastrointestinal: Negative for abdominal pain, blood in stool, diarrhea, nausea and vomiting.  Genitourinary: Negative for difficulty urinating, dysuria, flank pain, frequency, hematuria and urgency.  Musculoskeletal: Positive for arthralgias (L shoulder, see HPI). Negative for back pain, joint swelling, myalgias and neck stiffness.  Skin: Negative for pallor and rash.  Neurological: Negative for dizziness, speech difficulty, weakness and headaches.  Hematological: Negative for adenopathy. Does not bruise/bleed easily.  Psychiatric/Behavioral: Negative for confusion and sleep disturbance. The patient is not nervous/anxious.     PE; Blood pressure 103/63, pulse 61, temperature (!) 97.5 F (36.4 C), temperature source Oral, resp. rate 16,  height 6' (1.829 m), weight 219 lb (99.3 kg), SpO2 97 %. Gen: Alert, well appearing.  Patient is oriented to person, place, time, and situation. AFFECT: pleasant, lucid thought and speech. ENT: Ears: EACs clear, normal epithelium.  TMs with good light reflex and landmarks bilaterally.  Eyes: no injection, icteris, swelling, or exudate.  EOMI, PERRLA. Nose: no drainage or turbinate edema/swelling.  No injection or focal lesion.  Mouth: lips without lesion/swelling.  Oral mucosa pink and moist.  Dentition intact and without obvious caries or gingival swelling.  Oropharynx without erythema, exudate, or swelling.  Neck: supple/nontender.  No LAD, mass, or TM.  Carotid pulses 2+ bilaterally, without bruits. CV: RRR, no m/r/g.   LUNGS: CTA bilat, nonlabored resps, good aeration in all lung fields. ABD: soft, NT, ND, BS normal.  No hepatospenomegaly or mass.  No bruits. EXT: no clubbing, cyanosis, or edema.  Left shoulder without deformity, swelling, or bruising.  No TTP anywhere on L shoulder. ROM intact except when he gets to about 110 deg aBduction.  +Pain with ER at this point as well. +Impingement sign L shoulder. Musculoskeletal: no joint swelling, erythema, warmth, or tenderness.  ROM of all joints intact. Skin - no sores or suspicious lesions or rashes or color changes Rectal exam: negative without mass, lesions or tenderness, PROSTATE EXAM: smooth and symmetric without nodules or tenderness.  Pertinent labs:  Lab Results  Component Value Date   TSH 1.71 11/24/2016   Lab Results  Component Value Date   WBC 6.7 06/16/2017   HGB 13.9 06/16/2017   HCT 40.1 06/16/2017   MCV 80 06/16/2017   PLT 216 06/16/2017   Lab Results  Component Value Date   CREATININE 0.86 06/16/2017   BUN 18 06/16/2017   NA 140 06/16/2017   K 4.4 06/16/2017   CL 104 06/16/2017   CO2 22 06/16/2017   Lab Results  Component Value Date   ALT 20 10/30/2016   AST 25 10/30/2016   ALKPHOS 40 10/30/2016    BILITOT 0.6 10/30/2016   Lab Results  Component Value Date   CHOL 135 09/24/2016   Lab Results  Component Value Date   HDL 40.30 09/24/2016   Lab Results  Component Value Date   LDLCALC 82 09/24/2016   Lab Results  Component Value Date   TRIG 67.0 09/24/2016   Lab Results  Component Value Date   CHOLHDL 3 09/24/2016   Lab Results  Component Value Date   PSA 2.50 03/10/2017   PSA 4.17 (H) 09/24/2016   PSA 2.59 09/19/2015   Lab Results  Component Value Date   HGBA1C 5.5 10/30/2016    ASSESSMENT AND PLAN:  1) Left shoulder RC impingement syndrome.  PT referral ordered.  2) Health maintenance exam: Reviewed age and gender appropriate health maintenance issues (prudent diet, regular exercise, health risks of tobacco and excessive alcohol, use of seatbelts, fire alarms in home, use of sunscreen).  Also reviewed age and gender appropriate health screening as well as vaccine recommendations. Vaccines: UTD, including flu vaccine.  Shingrix rx given today. Labs: PSA. Prostate ca screening/hx of increased PSA velocity:  Per urol; annual PSA with me (most recent was 02/2017, but pt chooses to get this test done today and if normal then we'll repeat in 1 yr). Colono cancer screening; next colonoscopy due 2023.  FOLLOW UP:  Return in about 6 months (around 03/21/2018) for routine chronic illness f/u.  Signed:  Crissie Sickles, MD           09/20/2017

## 2017-09-20 NOTE — Patient Instructions (Signed)

## 2017-09-21 ENCOUNTER — Encounter: Payer: Self-pay | Admitting: *Deleted

## 2017-09-28 ENCOUNTER — Ambulatory Visit (INDEPENDENT_AMBULATORY_CARE_PROVIDER_SITE_OTHER): Payer: PPO | Admitting: Physician Assistant

## 2017-09-28 ENCOUNTER — Encounter: Payer: Self-pay | Admitting: Physician Assistant

## 2017-09-28 VITALS — BP 140/70 | HR 61 | Temp 97.9°F | Ht 72.0 in | Wt 219.4 lb

## 2017-09-28 DIAGNOSIS — H00025 Hordeolum internum left lower eyelid: Secondary | ICD-10-CM | POA: Diagnosis not present

## 2017-09-28 MED ORDER — ERYTHROMYCIN 5 MG/GM OP OINT
1.0000 | TOPICAL_OINTMENT | Freq: Every day | OPHTHALMIC | 0 refills | Status: DC
Start: 2017-09-28 — End: 2018-02-21

## 2017-09-28 NOTE — Progress Notes (Signed)
Keyshun Elpers is a 71 y.o. male here for a new problem.  I acted as a Education administrator for [employer], PA-C [employer], LPN  History of Present Illness:   Chief Complaint  Patient presents with  . pimple on bottom left eyelid    x 2 days    Eye Problem   The left eye is affected. This is a new problem. Episode onset: 2 days ago pimple on bottom eyelid. The problem occurs constantly. The problem has been gradually improving. There was no injury mechanism. The pain is at a severity of 2/10. The pain is mild. There is no known exposure to pink eye. He does not wear contacts. Associated symptoms include an eye discharge, eye redness and itching. Pertinent negatives include no fever. Associated symptoms comments: Left bottom eyelid, slight edema and red.. He has tried eye drops (Hot compresses) for the symptoms. The treatment provided mild relief.   Wears glasses. Symptoms gradually improving with time, however he continues to have a bit of crusting/discharge on his lids in the morning. He is leaving for a trip soon and wanted his eye evaluated prior to leaving. He used some lubrication drops without effectiveness.   Past Medical History:  Diagnosis Date  . 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  . Basal cell carcinoma    nose  . Chronic hoarseness    secondary to hx of laryngeal surgery  . GERD (gastroesophageal reflux disease)   . Headache   . Heart murmur   . History of tobacco abuse   . Hypertriglyceridemia   . Increased prostate specific antigen (PSA) velocity 09/2016   saw Dr. Jeffie Pollock 11/18/16: plan is to repeat PSA 6 wks and if still same/up then plan TRUS+ bx.  PSA's declined on recheck x 2 and as of 03/10/17 was 2.5.  Recommended annual PSA's with me, f/u with urol prn.  . 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  Marland Kitchen Left ventricular dysfunction 2018   see echo results in Surgical hx section, 01/2017 and 05/2017.  Marland Kitchen Lesion of vocal cord   . Mitral regurgitation    severe by TEE 08/2016:  MV repair done 11/03/16  . Mitral valve prolapse   . 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  Per CV surg, as of 02/16/17, his INR goal is 1.5-2.5.  Marland Kitchen s/p Bentall procedure for aortic root aneurysm ([place]) 11/03/2016  . 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   Complex valvuloplasty including artificial Gore-tex neochord placement x6 and 28 mm Sorin Memo 3D ring annuloplasty     Social History   Social History  . Marital status: Married    Spouse name: N/A  . Number of children: N/A  . Years of education: N/A   Occupational History  . Not on file.   Social History Main Topics  . Smoking status: Former Smoker    Quit date: 07/22/1979  . Smokeless tobacco: Never Used  . Alcohol use Yes     Comment: rare  . Drug use: No  . Sexual activity: Not on file   Other Topics Concern  . Not on file  Social History Narrative   Married, one son deceased 17-Apr-2011.   Relocated from [place] to [facility] 06/2013 to be near grandchildren.   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   Smoker 20 pack-yr hx: quit about 1980.   Alcohol: 3 bottles of beer per week avg.     Drugs: none             Past Surgical History:  Procedure Laterality Date  . BENTALL PROCEDURE N/A 11/03/2016   Procedure: BENTALL PROCEDURE AORTIC ROOT REPLACEMENT + mechanical AV;  Surgeon: Rexene Alberts, MD;  Location: [place];  Service: Open Heart Surgery;  Laterality: N/A;  . CARDIAC CATHETERIZATION N/A 09/29/2016   Clean coronaries, EF 60-65%.  Procedure: Right/Left Heart Cath and Coronary Angiography;  Surgeon: Jettie Booze,  MD;  Location: [place] [facility];  Service: Cardiovascular;  Laterality: N/A;  . CHOLECYSTECTOMY  1980  . COLONOSCOPY  04/16/2002 and 04-16-12   Both normal.  . DIRECT LARYNGOSCOPY Left 10/17/2015   Procedure: MICRO DIRECT LARYNGOSCOPY WITH FROZEN SECTION;  Surgeon: Jodi Marble, MD;  Location: [place];  Service: ENT;  Laterality: Left;  . MITRAL VALVE REPAIR N/A 11/03/2016   Procedure: MITRAL VALVE REPAIR (MVR);  Surgeon: Rexene Alberts, MD;  Location: [place];  Service: Open Heart Surgery;  Laterality: N/A;  . PERIPHERAL VASCULAR CATHETERIZATION N/A 09/29/2016   Procedure: Abdominal Aortogram;  Surgeon: Jettie Booze, MD;  Location: [place] [facility];  Service: Cardiovascular;  Laterality: N/A;  . PERIPHERAL VASCULAR CATHETERIZATION N/A 09/29/2016   Procedure: Thoracic Aortogram;  Surgeon: Jettie Booze, MD;  Location: [place] [facility];  Service: Cardiovascular;  Laterality: N/A;  . TEE WITHOUT CARDIOVERSION N/A 09/17/2016   Procedure: TRANSESOPHAGEAL ECHOCARDIOGRAM (TEE);  Surgeon: Jerline Pain, MD;  Location: [place];  Service: Cardiovascular;  Laterality: N/A;  . TEE WITHOUT CARDIOVERSION N/A 11/03/2016   Procedure: TRANSESOPHAGEAL ECHOCARDIOGRAM (TEE);  Surgeon: Rexene Alberts, MD;  Location: [place];  Service: Open Heart Surgery;  Laterality: N/A;  . TONSILECTOMY/ADENOIDECTOMY WITH MYRINGOTOMY  1960  . 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  Mod/severe AR w/functional bicuspid AV.  EF 45%.  . TRANSTHORACIC ECHOCARDIOGRAM  07/2015   Bicuspid aortic valve + mod mitral regurg  . TRANSTHORACIC ECHOCARDIOGRAM  02/01/2017   (Post aortic root/valve surgery): EF 35%, diffuse hypokinesis, LV dilatation, grade I DD. Cardiologist added ARB and increased his BB dose.  Lasix prn added 06/2017 due to some LE edema.  . TRANSTHORACIC ECHOCARDIOGRAM  05/2017   LVEF 45-50%.  Grd II DD.  Marland Kitchen Vocal cord biopsy  09/2015   No atypia or inflammation.  Some fibrous  changes that are possibly from hx of radiation therapy.    Family History  Problem Relation Age of Onset  . Heart disease Father   . Breast cancer Mother   . Diabetes Sister     No Known Allergies  Current Medications:   Current Outpatient Prescriptions:  .  acetaminophen (TYLENOL) 500 MG tablet, Take 500-1,000 mg by mouth every 6 (six) hours as needed for mild pain (depends on pain level if takes 500 mg or 1000 mg). , Disp: , Rfl:  .  aspirin EC 81 MG EC tablet, Take 1 tablet (81 mg total) by mouth daily., Disp: , Rfl:  .  fenofibrate (TRICOR) 145 MG tablet, TAKE 1 TABLET(145 MG) BY MOUTH DAILY, Disp: 90 tablet, Rfl: 3 .  furosemide (LASIX) 20 MG tablet, Take 20 mg by mouth daily  as needed for fluid or edema (2 or more pounds)., Disp: , Rfl:  .  losartan (COZAAR) 100 MG tablet, Take 100 mg by mouth daily., Disp: , Rfl:  .  metoprolol succinate (TOPROL-XL) 50 MG 24 hr tablet, Take 1 tablet (50 mg total) by mouth daily., Disp: 90 tablet, Rfl: 3 .  Multiple Vitamins-Minerals (MULTIVITAMIN PO), Take 1 tablet by mouth daily. , Disp: , Rfl:  .  pantoprazole (PROTONIX) 40 MG tablet, Take 1 tablet (40 mg total) by mouth daily., Disp: 90 tablet, Rfl: 3 .  warfarin (COUMADIN) 5 MG tablet, TAKE AS DIRECTED BY COUMADIN CLINIC, Disp: 30 tablet, Rfl: 3 .  erythromycin ophthalmic ointment, Place 1 application into the left eye at bedtime., Disp: 3.5 g, Rfl: 0   Review of Systems:   Review of Systems  Constitutional: Negative for chills, fever, malaise/fatigue and weight loss.  HENT: Negative for congestion, ear discharge, ear pain, sinus pain and sore throat.   Eyes: Positive for discharge, redness and itching.  Respiratory: Negative for cough and shortness of breath.   Cardiovascular: Negative for chest pain.  Neurological: Negative for headaches.    Vitals:   Vitals:   09/28/17 1309  BP: 140/70  Pulse: 61  Temp: 97.9 F (36.6 C)  TempSrc: Oral  SpO2: 97%  Weight: 219 lb 6.1 oz  (99.5 kg)  Height: 6' (1.829 m)     Body mass index is 29.75 kg/m.  Physical Exam:   Physical Exam  Constitutional: He appears well-developed. He is cooperative.  Non-toxic appearance. He does not have a sickly appearance. He does not appear ill. No distress.  HENT:  Head: Normocephalic and atraumatic.  Right Ear: Tympanic membrane, external ear and ear canal normal. Tympanic membrane is not erythematous, not retracted and not bulging.  Left Ear: Tympanic membrane, external ear and ear canal normal. Tympanic membrane is not erythematous, not retracted and not bulging.  Nose: Nose normal. Right sinus exhibits no maxillary sinus tenderness and no frontal sinus tenderness. Left sinus exhibits no maxillary sinus tenderness and no frontal sinus tenderness.  Mouth/Throat: Uvula is midline. No posterior oropharyngeal edema or posterior oropharyngeal erythema.  Eyes: Lids are normal. Right eye exhibits no discharge, no exudate and no hordeolum. Left eye exhibits exudate (light green, on lower eyelashes) and hordeolum (resolving at inner 1/3rd of lower lid). Left eye exhibits no discharge. Right conjunctiva is not injected. Left conjunctiva is injected.  Neck: Trachea normal.  Cardiovascular: Normal rate, regular rhythm, S1 normal, S2 normal and normal heart sounds.   Pulmonary/Chest: Effort normal and breath sounds normal. He has no decreased breath sounds. He has no wheezes. He has no rhonchi. He has no rales.  Lymphadenopathy:    He has no cervical adenopathy.  Neurological: He is alert.  Skin: Skin is warm, dry and intact.  Psychiatric: He has a normal mood and affect. His speech is normal and behavior is normal.  Nursing note and vitals reviewed.   Assessment and Plan:    Vasilios was seen today for pimple on bottom left eyelid.  Diagnoses and all orders for this visit:  Hordeolum internum of left lower eyelid He has been using warm compresses with good relief. It does appear that he  may have a secondary bacterial infection in his eye from the spontaneous drainage. Treat with erythromycin ointment per orders. Reviewed how to use ointment. Follow-up if symptoms worsen or persist.  Other orders -     erythromycin ophthalmic ointment; Place 1 application into  the left eye at bedtime.    . Reviewed expectations re: course of current medical issues. . Discussed self-management of symptoms. . Outlined signs and symptoms indicating need for more acute intervention. . Patient verbalized understanding and all questions were answered. . See orders for this visit as documented in the electronic medical record. . Patient received an After-Visit Summary.  CMA or LPN served as scribe during this visit. History, Physical, and Plan performed by medical provider. Documentation and orders reviewed and attested to.  Inda Coke, PA-C

## 2017-09-28 NOTE — Patient Instructions (Signed)
How to Use Eye Drops and Eye Ointments How to apply eye drops Follow these steps when applying eye drops: 1. Wash your hands. 2. Tilt your head back. 3. Put a finger under your eye and use it to gently pull your lower lid downward. Keep that finger in place. 4. Using your other hand, hold the dropper between your thumb and index finger. 5. Position the dropper just over the edge of the lower lid. Hold it as close to your eye as you can without touching the dropper to your eye. 6. Steady your hand. One way to do this is to lean your index finger against your brow. 7. Look up. 8. Slowly and gently squeeze one drop of medicine into your eye. 9. Close your eye. 10. Place a finger between your lower eyelid and your nose. Press gently for 2 minutes. This increases the amount of time that the medicine is exposed to the eye. It also reduces side effects that can develop if the drop gets into the bloodstream through the nose.  How to apply eye ointments Follow these steps when applying eye ointments: 1. Wash your hands. 2. Put a finger under your eye and use it to gently pull your lower lid downward. Keep that finger in place. 3. Using your other hand, place the tip of the tube between your thumb and index finger with the remaining fingers braced against your cheek or nose. 4. Hold the tube just over the edge of your lower lid without touching the tube to your lid or eyeball. 5. Look up. 6. Line the inner part of your lower lid with ointment. 7. Gently pull up on your upper lid and look down. This will force the ointment to spread over the surface of the eye. 8. Release the upper lid. 9. If you can, close your eyes for 1-2 minutes.  Do not rub your eyes. If you applied the ointment correctly, your vision will be blurry for a few minutes. This is normal. Additional information  Make sure to use the eye drops or ointment as told by your health care provider.  If you have been told to use both eye  drops and an eye ointment, apply the eye drops first, then wait 3-4 minutes before you apply the ointment.  Try not to touch the tip of the dropper or tube to your eye. A dropper or tube that has touched the eye can become contaminated. This information is not intended to replace advice given to you by your health care provider. Make sure you discuss any questions you have with your health care provider. Document Released: 03/15/2001 Document Revised: 05/07/2016 Document Reviewed: 12/03/2014 Elsevier Interactive Patient Education  2018 [employer]  

## 2017-10-07 DIAGNOSIS — M7542 Impingement syndrome of left shoulder: Secondary | ICD-10-CM | POA: Diagnosis not present

## 2017-10-11 ENCOUNTER — Encounter: Payer: Self-pay | Admitting: Family Medicine

## 2017-10-12 DIAGNOSIS — M7542 Impingement syndrome of left shoulder: Secondary | ICD-10-CM | POA: Diagnosis not present

## 2017-10-14 DIAGNOSIS — M7542 Impingement syndrome of left shoulder: Secondary | ICD-10-CM | POA: Diagnosis not present

## 2017-10-18 DIAGNOSIS — M7542 Impingement syndrome of left shoulder: Secondary | ICD-10-CM | POA: Diagnosis not present

## 2017-10-21 DIAGNOSIS — M7542 Impingement syndrome of left shoulder: Secondary | ICD-10-CM | POA: Diagnosis not present

## 2017-10-25 DIAGNOSIS — M7542 Impingement syndrome of left shoulder: Secondary | ICD-10-CM | POA: Diagnosis not present

## 2017-10-27 ENCOUNTER — Ambulatory Visit (INDEPENDENT_AMBULATORY_CARE_PROVIDER_SITE_OTHER): Payer: PPO | Admitting: *Deleted

## 2017-10-27 DIAGNOSIS — Z954 Presence of other heart-valve replacement: Secondary | ICD-10-CM | POA: Diagnosis not present

## 2017-10-27 DIAGNOSIS — Z5181 Encounter for therapeutic drug level monitoring: Secondary | ICD-10-CM

## 2017-10-27 DIAGNOSIS — I719 Aortic aneurysm of unspecified site, without rupture: Secondary | ICD-10-CM | POA: Diagnosis not present

## 2017-10-27 DIAGNOSIS — Z9889 Other specified postprocedural states: Secondary | ICD-10-CM | POA: Diagnosis not present

## 2017-10-27 DIAGNOSIS — I7121 Aneurysm of the ascending aorta, without rupture: Secondary | ICD-10-CM

## 2017-10-27 LAB — POCT INR: INR: 2.2

## 2017-10-28 DIAGNOSIS — M7542 Impingement syndrome of left shoulder: Secondary | ICD-10-CM | POA: Diagnosis not present

## 2017-11-01 DIAGNOSIS — M7542 Impingement syndrome of left shoulder: Secondary | ICD-10-CM | POA: Diagnosis not present

## 2017-11-04 DIAGNOSIS — M7542 Impingement syndrome of left shoulder: Secondary | ICD-10-CM | POA: Diagnosis not present

## 2017-11-15 ENCOUNTER — Other Ambulatory Visit: Payer: Self-pay

## 2017-11-15 ENCOUNTER — Encounter: Payer: Self-pay | Admitting: Thoracic Surgery (Cardiothoracic Vascular Surgery)

## 2017-11-15 ENCOUNTER — Ambulatory Visit: Payer: PPO | Admitting: Thoracic Surgery (Cardiothoracic Vascular Surgery)

## 2017-11-15 VITALS — BP 119/73 | HR 73 | Ht 72.0 in | Wt 211.0 lb

## 2017-11-15 DIAGNOSIS — Z954 Presence of other heart-valve replacement: Secondary | ICD-10-CM

## 2017-11-15 DIAGNOSIS — I719 Aortic aneurysm of unspecified site, without rupture: Secondary | ICD-10-CM | POA: Diagnosis not present

## 2017-11-15 DIAGNOSIS — Z9889 Other specified postprocedural states: Secondary | ICD-10-CM

## 2017-11-15 DIAGNOSIS — I7121 Aneurysm of the ascending aorta, without rupture: Secondary | ICD-10-CM

## 2017-11-15 NOTE — Patient Instructions (Signed)

## 2017-11-15 NOTE — Progress Notes (Signed)
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  The patient's postoperative recovery was uneventful and he was last seen here in our office on February 15, 2017 at which time he was doing well.  He returns to our office today and reports that he is doing quite well.  He specifically denies any symptoms of exertional shortness of breath or chest discomfort.  He has normal physical activity with no significant limitations whatsoever.  He has not had any problems managing long-term anticoagulation using warfarin.  Overall he is delighted with his progress, although he points out that he was essentially asymptomatic prior to surgery.   Current Outpatient Medications  Medication Sig Dispense Refill  . acetaminophen (TYLENOL) 500 MG tablet Take 500-1,000 mg by mouth every 6 (six) hours as needed for mild pain (depends on pain level if takes 500 mg or 1000 mg).     Marland Kitchen aspirin EC 81 MG EC tablet Take 1 tablet (81 mg total) by mouth daily.    Marland Kitchen erythromycin ophthalmic ointment Place 1 application into the left eye at bedtime. 3.5 g 0  . fenofibrate (TRICOR) 145 MG tablet TAKE 1 TABLET(145 MG) BY MOUTH DAILY 90 tablet 3  . furosemide (LASIX) 20 MG tablet Take 20 mg by mouth daily as needed for fluid or edema (2 or more pounds).    . losartan (COZAAR) 100 MG tablet Take 100 mg by mouth daily.    . metoprolol  succinate (TOPROL-XL) 50 MG 24 hr tablet Take 1 tablet (50 mg total) by mouth daily. 90 tablet 3  . Multiple Vitamins-Minerals (MULTIVITAMIN PO) Take 1 tablet by mouth daily.     . pantoprazole (PROTONIX) 40 MG tablet Take 1 tablet (40 mg total) by mouth daily. 90 tablet 3  . warfarin (COUMADIN) 5 MG tablet TAKE AS DIRECTED BY COUMADIN CLINIC 30 tablet 3   No current facility-administered medications for this visit.       Physical Exam:   BP 119/73 (BP Location: Left Arm, Patient Position: Sitting, Cuff Size: Large)   Pulse 73   Ht 6' (1.829 m)   Wt 211 lb (95.7 kg)   SpO2 98%   BMI 28.62 kg/m   General:  Well-appearing  Chest:   Clear to auscultation  CV:   Regular rate and rhythm with crisp mechanical heart valve sounds, no murmur noted  Incisions:  Completely healed, sternum is stable  Abdomen:  Soft nontender  Extremities:  Warm and well perfused  Diagnostic Tests:  Transthoracic Echocardiography  Patient:    Linkin, Vizzini MR #:       [id number] Study Date: 06/08/2017 Gender:     M Age:        86 Height:     182.9 cm Weight:     94.4 kg BSA:        2.21  m^2 Pt. Status: Room:   ATTENDING    Mertie Moores, M.D.  SONOGRAPHER  Cindy Hazy, RDCS  PERFORMING   [facility]  ORDERING     Eileen Stanford  REFERRING    Angelena Form R  cc:  ------------------------------------------------------------------- LV EF: 45% -   50%  ------------------------------------------------------------------- Indications:      I95.18 Acute Systolic CHF.  ------------------------------------------------------------------- History:   PMH:  Acquired from the patient and from the patient&'s chart.  PMH:  History of Bicuspid Aortic Valve. Murmur. History of Mitral Valve Prolapse.  Risk factors:  Former tobacco use.  ------------------------------------------------------------------- Study Conclusions  - Left ventricle: The cavity size was normal.  Systolic function was   mildly reduced. The estimated ejection fraction was in the range   of 45% to 50%. Features are consistent with a pseudonormal left   ventricular filling pattern, with concomitant abnormal relaxation   and increased filling pressure (grade 2 diastolic dysfunction). - Aortic valve: A mechanical prosthesis was present. - Mitral valve: Prior procedures included surgical repair.  ------------------------------------------------------------------- Labs, prior tests, procedures, and surgery: Status post Aortic Valve Replacement. Status post Mitral Valve Repair.  ------------------------------------------------------------------- Study data:   Study status:  Routine.  Procedure:  The patient reported no pain pre or post test. Transthoracic echocardiography for left ventricular function evaluation, for right ventricular function evaluation, and for assessment of valvular function. Image quality was adequate.  Study completion:  There were no complications.          Transthoracic echocardiography.  M-mode, complete 2D, spectral Doppler, and color Doppler.  Birthdate: Patient birthdate: 12/10/1946.  Age:  Patient is 71 yr old.  Sex: Gender: male.    BMI: 28.2 kg/m^2.  Blood pressure:     128/70 Patient status:  Outpatient.  Study date:  Study date: 06/08/2017. Study time: 08:44 AM.  Location:  [facility]  -------------------------------------------------------------------  ------------------------------------------------------------------- Left ventricle:  The cavity size was normal. Systolic function was mildly reduced. The estimated ejection fraction was in the range of 45% to 50%. Features are consistent with a pseudonormal left ventricular filling pattern, with concomitant abnormal relaxation and increased filling pressure (grade 2 diastolic dysfunction).  ------------------------------------------------------------------- Aortic valve:  A mechanical  prosthesis was present.  Doppler: There was no stenosis.   There was no regurgitation.    VTI ratio of LVOT to aortic valve: 0.44. Peak velocity ratio of LVOT to aortic valve: 0.34. Mean velocity ratio of LVOT to aortic valve: 0.39.    Mean gradient (S): 6 mm Hg. Peak gradient (S): 13 mm Hg.   ------------------------------------------------------------------- Aorta:  Aortic root: The aortic root was normal in size. Ascending aorta: The ascending aorta was normal in size.  ------------------------------------------------------------------- Mitral valve:  Prior procedures included surgical repair.  Doppler:  There was no significant regurgitation.    Valve area by pressure half-time: 1.55 cm^2. Indexed valve area by pressure half-time: 0.7 cm^2/m^2.    Mean gradient (D): 6 mm Hg. Peak gradient (D): 9 mm Hg.  ------------------------------------------------------------------- Left atrium:  The atrium was normal in size.  ------------------------------------------------------------------- Right ventricle:  The cavity size was normal. Systolic function was normal.  ------------------------------------------------------------------- Pulmonic valve:    The valve appears to be grossly normal. Doppler:  There was trivial regurgitation.  ------------------------------------------------------------------- Tricuspid valve:   The valve appears to be grossly normal. Doppler:  There was trivial regurgitation.  ------------------------------------------------------------------- Right atrium:  The atrium was normal in size.  ------------------------------------------------------------------- Pericardium:  There was no pericardial effusion.  -------------------------------------------------------------------  Measurements   Left ventricle                         Value          Reference  LV ID, ED, PLAX chordal        (H)     53.4  mm       43 - 52  LV ID, ES, PLAX chordal         (H)     41.6  mm       23 - 38  LV fx shortening, PLAX chordal (L)     22    %        >=29  LV PW thickness, ED                    11.2  mm       ---------  IVS/LV PW ratio, ED                    0.96           <=1.3  LV e&', lateral                         9.65  cm/s     ---------  LV E/e&', lateral                       15.34          ---------  LV e&', medial                          6.58  cm/s     ---------  LV E/e&', medial                        22.49          ---------  LV e&', average                         8.12  cm/s     ---------  LV E/e&', average                       18.24          ---------    Ventricular septum                     Value          Reference  IVS thickness, ED                      10.8  mm       ---------    LVOT                                   Value          Reference  LVOT peak velocity, S                  61.1  cm/s     ---------  LVOT mean velocity, S                  48    cm/s     ---------  LVOT  VTI, S                            15.3  cm       ---------  LVOT peak gradient, S                  1     mm Hg    ---------    Aortic valve                           Value          Reference  Aortic valve peak velocity, S          179   cm/s     ---------  Aortic valve mean velocity, S          122   cm/s     ---------  Aortic valve VTI, S                    35.1  cm       ---------  Aortic mean gradient, S                6     mm Hg    ---------  Aortic peak gradient, S                13    mm Hg    ---------  VTI ratio, LVOT/AV                     0.44           ---------  Velocity ratio, peak, LVOT/AV          0.34           ---------  Velocity ratio, mean, LVOT/AV          0.39           ---------    Aorta                                  Value          Reference  Aortic root ID, ED                     36    mm       ---------  Ascending aorta ID, A-P, S             34    mm       ---------    Left atrium                            Value           Reference  LA ID, A-P, ES                         48    mm       ---------  LA ID/bsa, A-P                         2.17  cm/m^2   <=2.2  LA volume, S  66    ml       ---------  LA volume/bsa, S                       29.9  ml/m^2   ---------  LA volume, ES, 1-p A4C                 63    ml       ---------  LA volume/bsa, ES, 1-p A4C             28.5  ml/m^2   ---------  LA volume, ES, 1-p A2C                 66    ml       ---------  LA volume/bsa, ES, 1-p A2C             29.9  ml/m^2   ---------    Mitral valve                           Value          Reference  Mitral E-wave peak velocity            148   cm/s     ---------  Mitral A-wave peak velocity            146   cm/s     ---------  Mitral mean velocity, D                114   cm/s     ---------  Mitral deceleration time       (H)     254   ms       150 - 230  Mitral pressure half-time              123   ms       ---------  Mitral mean gradient, D                6     mm Hg    ---------  Mitral peak gradient, D                9     mm Hg    ---------  Mitral E/A ratio, peak                 1              ---------  Mitral valve area, PHT, DP             1.55  cm^2     ---------  Mitral valve area/bsa, PHT, DP         0.7   cm^2/m^2 ---------  Mitral annulus VTI, D                  57    cm       ---------    Tricuspid valve                        Value          Reference  Tricuspid regurg peak velocity         225   cm/s     ---------  Tricuspid peak RV-RA gradient          20    mm Hg    ---------  Right ventricle                        Value          Reference  RV s&', lateral, S                      9.76  cm/s     ---------  Legend: (L)  and  (H)  mark values outside specified reference range.  ------------------------------------------------------------------- Prepared and Electronically Authenticated by  Mertie Moores, M.D. 2018-06-19T10:40:03   Impression:  Patient is doing very  well approximately 1 year status post Bentall aortic root replacement using mechanical prosthetic valve conduit with mitral valve repair.  Plan:  We have not recommended any changes to the patient's current medications.  The patient has been reminded regarding the importance of dental hygiene and the lifelong need for antibiotic prophylaxis for all dental cleanings and other related invasive procedures.  In the future he will call and return to see [place] as needed.  I spent in excess of 15 minutes during the conduct of this office consultation and >50% of this time involved direct face-to-face encounter with the patient for counseling and/or coordination of their care.    Valentina Gu. Roxy Manns, MD 11/15/2017 10:47 AM

## 2017-12-01 DIAGNOSIS — H524 Presbyopia: Secondary | ICD-10-CM | POA: Diagnosis not present

## 2017-12-08 ENCOUNTER — Ambulatory Visit (INDEPENDENT_AMBULATORY_CARE_PROVIDER_SITE_OTHER): Payer: PPO

## 2017-12-08 DIAGNOSIS — Z9889 Other specified postprocedural states: Secondary | ICD-10-CM | POA: Diagnosis not present

## 2017-12-08 DIAGNOSIS — Z954 Presence of other heart-valve replacement: Secondary | ICD-10-CM

## 2017-12-08 DIAGNOSIS — Z5181 Encounter for therapeutic drug level monitoring: Secondary | ICD-10-CM | POA: Diagnosis not present

## 2017-12-08 DIAGNOSIS — I7121 Aneurysm of the ascending aorta, without rupture: Secondary | ICD-10-CM

## 2017-12-08 DIAGNOSIS — I719 Aortic aneurysm of unspecified site, without rupture: Secondary | ICD-10-CM | POA: Diagnosis not present

## 2017-12-08 LAB — POCT INR: INR: 2.9

## 2017-12-08 NOTE — Patient Instructions (Signed)
Take 1/2 tablet tomorrow, then resume same dosage 1 tablet daily except 1/2 tablet on Mondays and Fridays. Recheck INR in 4 weeks. Call Coumadin clinic with any questions [phone].

## 2017-12-15 ENCOUNTER — Other Ambulatory Visit: Payer: Self-pay

## 2017-12-15 MED ORDER — WARFARIN SODIUM 5 MG PO TABS: ORAL_TABLET | ORAL | 3 refills | Status: DC

## 2018-01-05 ENCOUNTER — Ambulatory Visit (INDEPENDENT_AMBULATORY_CARE_PROVIDER_SITE_OTHER): Payer: PPO | Admitting: *Deleted

## 2018-01-05 DIAGNOSIS — Z9889 Other specified postprocedural states: Secondary | ICD-10-CM | POA: Diagnosis not present

## 2018-01-05 DIAGNOSIS — Z954 Presence of other heart-valve replacement: Secondary | ICD-10-CM | POA: Diagnosis not present

## 2018-01-05 DIAGNOSIS — I719 Aortic aneurysm of unspecified site, without rupture: Secondary | ICD-10-CM | POA: Diagnosis not present

## 2018-01-05 DIAGNOSIS — I7121 Aneurysm of the ascending aorta, without rupture: Secondary | ICD-10-CM

## 2018-01-05 DIAGNOSIS — Z5181 Encounter for therapeutic drug level monitoring: Secondary | ICD-10-CM

## 2018-01-05 LAB — POCT INR: INR: 2.5

## 2018-01-05 NOTE — Patient Instructions (Signed)
Description   Continue taking same dosage 1 tablet daily except 1/2 tablet on Mondays and Fridays. Recheck INR in 4 weeks. Call Coumadin clinic with any questions [phone].

## 2018-02-02 ENCOUNTER — Ambulatory Visit (INDEPENDENT_AMBULATORY_CARE_PROVIDER_SITE_OTHER): Payer: PPO | Admitting: *Deleted

## 2018-02-02 DIAGNOSIS — Z5181 Encounter for therapeutic drug level monitoring: Secondary | ICD-10-CM

## 2018-02-02 DIAGNOSIS — I7121 Aneurysm of the ascending aorta, without rupture: Secondary | ICD-10-CM

## 2018-02-02 DIAGNOSIS — I719 Aortic aneurysm of unspecified site, without rupture: Secondary | ICD-10-CM | POA: Diagnosis not present

## 2018-02-02 DIAGNOSIS — Z9889 Other specified postprocedural states: Secondary | ICD-10-CM | POA: Diagnosis not present

## 2018-02-02 DIAGNOSIS — Z954 Presence of other heart-valve replacement: Secondary | ICD-10-CM

## 2018-02-02 LAB — POCT INR: INR: 3.5

## 2018-02-02 NOTE — Patient Instructions (Signed)
Description   Do not take coumadin tomorrow Feb14th as he has taken today then  continue taking same dosage 1 tablet daily except 1/2 tablet on Mondays and Fridays. Recheck INR in 2 weeks. Call Coumadin clinic with any questions [phone].

## 2018-02-15 ENCOUNTER — Ambulatory Visit: Payer: PPO

## 2018-02-16 ENCOUNTER — Ambulatory Visit (INDEPENDENT_AMBULATORY_CARE_PROVIDER_SITE_OTHER): Payer: PPO | Admitting: *Deleted

## 2018-02-16 DIAGNOSIS — Z954 Presence of other heart-valve replacement: Secondary | ICD-10-CM | POA: Diagnosis not present

## 2018-02-16 DIAGNOSIS — I7121 Aneurysm of the ascending aorta, without rupture: Secondary | ICD-10-CM

## 2018-02-16 DIAGNOSIS — Z5181 Encounter for therapeutic drug level monitoring: Secondary | ICD-10-CM | POA: Diagnosis not present

## 2018-02-16 DIAGNOSIS — I719 Aortic aneurysm of unspecified site, without rupture: Secondary | ICD-10-CM

## 2018-02-16 DIAGNOSIS — Z9889 Other specified postprocedural states: Secondary | ICD-10-CM

## 2018-02-16 LAB — POCT INR: INR: 3.6

## 2018-02-16 NOTE — Patient Instructions (Signed)
Description   Do not take coumadin tomorrow Feb 28th  as he has taken today then  Change dose of coumadin to  1 tablet daily except 1/2 tablet on Mondays  Wednesdays and Fridays. Recheck INR in 2 weeks. Call Coumadin clinic with any questions [phone].

## 2018-02-18 ENCOUNTER — Ambulatory Visit: Payer: PPO

## 2018-02-18 NOTE — Progress Notes (Signed)
Subjective:   Kenneth Ryan is a 72 y.o. male who presents for Medicare Annual/Subsequent preventive examination.  Review of Systems:  No ROS.  Medicare Wellness Visit. Additional risk factors are reflected in the social history.  Cardiac Risk Factors include: advanced age (>9men, >41 women);male gender;hypertension;dyslipidemia;family history of premature cardiovascular disease   Sleep patterns: Sleeps 7-8 hours. Naps occasionally.  Home Safety/Smoke Alarms: Feels safe in home. Smoke alarms in place.  Living environment; residence and Firearm Safety: Lives with wife and BIL, in 1 story home. 3 steps at door, no issues Seat Belt Safety/Bike Helmet: Wears seat belt.   Male:   CCS-Colonoscopy 12/22/2011 Pt reports normal, recall 10 years     PSA- Followed by Urology Lab Results  Component Value Date   PSA 2.73 09/20/2017   PSA 2.50 03/10/2017   PSA 4.17 (H) 09/24/2016       Objective:    Vitals: BP 132/66 (BP Location: Left Arm, Patient Position: Sitting, Cuff Size: Normal)   Pulse 69   Ht 6' (1.829 m)   Wt 226 lb (102.5 kg)   SpO2 95%   BMI 30.65 kg/m   Body mass index is 30.65 kg/m.  Advanced Directives 02/21/2018 02/15/2017 12/23/2016 11/24/2016 11/09/2016 10/30/2016 09/29/2016  Does Patient Have a Medical Advance Directive? Yes Yes - Yes Yes Yes Yes  Type of Advance Directive [place];Living will Living will;Healthcare Power of [place];Living will [place];Living will -  Does patient want to make changes to medical advance directive? - - Yes (Inpatient - patient defers changing a medical advance directive at this time) No - Patient declined No - Patient declined No - Patient declined No - Patient declined  Copy of [place] in Chart? No - copy requested No - copy requested - No - copy requested No - copy requested No - copy requested No - copy requested      Tobacco Social History   Tobacco Use  Smoking Status Former Smoker  . Last attempt to quit: 07/22/1979  . Years since quitting: 38.6  Smokeless Tobacco Never Used     Counseling given: Not Answered    Past Medical History:  Diagnosis Date  . 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  . Basal cell carcinoma    nose  . Chronic hoarseness    secondary to hx of laryngeal surgery  . GERD (gastroesophageal reflux disease)   . Headache   . Heart murmur   . History of tobacco abuse   . Hypertriglyceridemia   . Increased prostate specific antigen (PSA) velocity 09/2016   saw Dr. Jeffie Pollock 11/18/16: plan is to repeat PSA 6 wks and if still same/up then plan TRUS+ bx.  PSA's declined on recheck x 2 and as of 03/10/17 was 2.5.  Recommended annual PSA's with me, f/u with urol prn.  . 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  Marland Kitchen Left ventricular dysfunction 2018   see echo results in Surgical hx section, 01/2017 and 05/2017.  Marland Kitchen Lesion of vocal cord   . Mitral regurgitation    severe by TEE 08/2016:  MV repair done 11/03/16  . Mitral valve prolapse    MV repair 10/2016  . 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  Per CV surg, as of 02/16/17, his INR goal is 1.5-2.5.  Marland Kitchen s/p Bentall procedure for aortic root aneurysm ([place]) 11/03/2016  . 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   Complex valvuloplasty including artificial Gore-tex neochord placement x6 and 28 mm Sorin Memo 3D ring annuloplasty   Past Surgical History:  Procedure Laterality Date  . BENTALL PROCEDURE N/A 11/03/2016   Procedure: BENTALL  PROCEDURE AORTIC ROOT REPLACEMENT + mechanical AV;  Surgeon: Rexene Alberts, MD;  Location: [facility];  Service: Open Heart Surgery;  Laterality: N/A;  . CARDIAC CATHETERIZATION N/A 09/29/2016   Clean coronaries, EF 60-65%.  Procedure: Right/Left Heart Cath and Coronary Angiography;  Surgeon: Jettie Booze, MD;  Location: [facility];  Service: Cardiovascular;  Laterality: N/A;  . CHOLECYSTECTOMY  1980  . COLONOSCOPY  2003 and 2013   Both normal.  . DIRECT LARYNGOSCOPY Left 10/17/2015   Procedure: MICRO DIRECT LARYNGOSCOPY WITH FROZEN SECTION;  Surgeon: Jodi Marble, MD;  Location: [facility];  Service: ENT;  Laterality: Left;  . MITRAL VALVE REPAIR N/A 11/03/2016   Procedure: MITRAL VALVE REPAIR (MVR);  Surgeon: Rexene Alberts, MD;  Location: [facility];  Service: Open Heart Surgery;  Laterality: N/A;  . PERIPHERAL VASCULAR CATHETERIZATION N/A 09/29/2016   Procedure: Abdominal Aortogram;  Surgeon: Jettie Booze, MD;  Location: [facility];  Service: Cardiovascular;  Laterality: N/A;  . PERIPHERAL VASCULAR CATHETERIZATION N/A 09/29/2016   Procedure: Thoracic Aortogram;  Surgeon: Jettie Booze, MD;  Location: [facility];  Service: Cardiovascular;  Laterality: N/A;  . TEE WITHOUT CARDIOVERSION N/A 09/17/2016   Procedure: TRANSESOPHAGEAL ECHOCARDIOGRAM (TEE);  Surgeon: Jerline Pain, MD;  Location: [facility];  Service: Cardiovascular;  Laterality: N/A;  . TEE WITHOUT CARDIOVERSION N/A 11/03/2016   Procedure: TRANSESOPHAGEAL ECHOCARDIOGRAM (TEE);  Surgeon: Rexene Alberts, MD;  Location: [facility];  Service: Open Heart Surgery;  Laterality: N/A;  . TONSILECTOMY/ADENOIDECTOMY WITH MYRINGOTOMY  1960  . 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  Mod/severe AR w/functional bicuspid AV.  EF 45%.  . TRANSTHORACIC ECHOCARDIOGRAM  07/2015   Bicuspid aortic valve + mod mitral regurg  . TRANSTHORACIC ECHOCARDIOGRAM  02/01/2017   (Post aortic  root/valve surgery): EF 35%, diffuse hypokinesis, LV dilatation, grade I DD. Cardiologist added ARB and increased his BB dose.  Lasix prn added 06/2017 due to some LE edema.  . TRANSTHORACIC ECHOCARDIOGRAM  05/2017   LVEF 45-50%.  Grd II DD.  Mechanical AV good, MV repair good.  . Vocal cord biopsy  09/2015   No atypia or inflammation.  Some fibrous changes that are possibly from hx of radiation therapy.   Family History  Problem Relation Age of Onset  . Heart disease Father   . Breast cancer Mother   . Diabetes Sister    Social History   Socioeconomic History  . Marital status: Married    Spouse name: None  . Number of children: None  . Years of education: None  . Highest education level: None  Social Needs  . Financial resource strain: None  . Food insecurity - worry: None  . Food insecurity - inability: None  . Transportation needs - medical: None  . Transportation needs - non-medical: None  Occupational History  . None  Tobacco Use  . Smoking status: Former Smoker    Last attempt to quit: 07/22/1979    Years since quitting: 38.6  . Smokeless tobacco: Never Used  Substance and Sexual Activity  . Alcohol use: Yes    Comment: rare  . Drug use: No  . Sexual activity: None  Other Topics Concern  . None  Social History Narrative   Married, one son deceased 04-09-11.   Relocated from [place] to [facility] 06/2013 to be near grandchildren.   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   Smoker 20 pack-yr hx: quit about 1980.   Alcohol: 3 bottles of beer per week avg.     Drugs: none          Outpatient Encounter Medications as of 02/21/2018  Medication Sig  . acetaminophen (TYLENOL) 500 MG tablet Take 500-1,000 mg by mouth every 6 (six) hours as needed for mild pain (depends on pain level if takes 500 mg or 1000 mg).   Marland Kitchen aspirin EC 81 MG EC tablet Take 1 tablet (81 mg total) by mouth daily.  . fenofibrate (TRICOR) 145 MG tablet TAKE 1 TABLET(145 MG) BY MOUTH DAILY  . furosemide (LASIX) 20 MG  tablet Take 20 mg by mouth daily as needed for fluid or edema (2 or more pounds).  . losartan (COZAAR) 100 MG tablet Take 100 mg by mouth daily.  . metoprolol succinate (TOPROL-XL) 50 MG 24 hr tablet Take 1 tablet (50 mg total) by mouth daily.  . Multiple Vitamins-Minerals (MULTIVITAMIN PO) Take 1 tablet by mouth daily.   . pantoprazole (PROTONIX) 40 MG tablet Take 1 tablet (40 mg total) by mouth daily.  Marland Kitchen warfarin (COUMADIN) 5 MG tablet TAKE AS DIRECTED BY COUMADIN CLINIC  . [DISCONTINUED] erythromycin ophthalmic ointment Place 1 application into the left eye at bedtime.   No facility-administered encounter medications on file as of 02/21/2018.     Activities of Daily Living In your present state of health, do you have any difficulty performing the following activities: 02/21/2018  Hearing? N  Vision? N  Difficulty concentrating or making decisions? N  Walking or climbing stairs? N  Dressing or bathing? N  Doing errands, shopping? N  Preparing Food and eating ? N  Using the Toilet? N  In the past six months, have you accidently leaked urine? N  Do you have problems with loss of bowel control? N  Managing your Medications? N  Managing your Finances? N  Housekeeping or managing your Housekeeping? N  Some recent data might be hidden    Patient Care Team: Tammi Sou, MD as PCP - General (Family Medicine) Jerline Pain, MD as Consulting Physician (Cardiology) Rexene Alberts, MD as Consulting Physician (Cardiothoracic Surgery) [name], MD as Consulting Physician (Urology) [place] (Dentistry)   Assessment:   This is a routine wellness examination for Kenneth Ryan.  Exercise Activities and Dietary recommendations Current Exercise Habits: The patient does not participate in regular exercise at present, Exercise limited by: None identified   Diet (meal preparation, eat out, water intake, caffeinated beverages, dairy products, fruits and vegetables): Drinks coffee, milk, water and  soda.   Eats heart healthy meals 3/day.   Goals    . Increase physical activity     Start using treadmill again.        Fall Risk Fall Risk  02/21/2018 02/15/2017 12/16/2016 09/18/2016 08/28/2015  Falls in the past year? No No No No No  Risk for fall due to : - - Medication side effect - -  Depression Screen PHQ 2/9 Scores 02/21/2018 02/15/2017 12/16/2016 09/18/2016  PHQ - 2 Score 0 0 0 0    Cognitive Function MMSE - Mini Mental State Exam 02/15/2017  Orientation to time 5  Orientation to Place 5  Registration 3  Attention/ Calculation 5  Recall 2  Language- name 2 objects 2  Language- repeat 1  Language- follow 3 step command 3  Language- read & follow direction 1  Write a sentence 1  Copy design 1  Total score 29       Ad8 score reviewed for issues:  Issues making decisions: no  Less interest in hobbies / activities: no  Repeats questions, stories (family complaining): no  Trouble using ordinary gadgets (microwave, computer, phone): no  Forgets the month or year: no  Mismanaging finances: no  Remembering appts: no  Daily problems with thinking and/or memory: no Ad8 score is=0     Immunization History  Administered Date(s) Administered  . Influenza, High Dose Seasonal PF 09/07/2016, 09/07/2017  . Influenza,inj,Quad PF,6+ Mos 09/15/2013, 08/28/2015  . Pneumococcal Conjugate-13 08/28/2015  . Pneumococcal Polysaccharide-23 09/18/2016  . Tdap 08/28/2015    Screening Tests Health Maintenance  Topic Date Due  . Hepatitis C Screening  10/03/2036 (Originally Aug 16, 1946)  . COLONOSCOPY  12/21/2021  . TETANUS/TDAP  08/27/2025  . INFLUENZA VACCINE  Completed  . PNA vac Low Risk Adult  Completed      Plan:    Bring a copy of your living will and/or healthcare power of attorney to your next office visit.  Continue doing brain stimulating activities (puzzles, reading, adult coloring books, staying active) to keep memory sharp.   I have personally  reviewed and noted the following in the patient's chart:   . Medical and social history . Use of alcohol, tobacco or illicit drugs  . Current medications and supplements . Functional ability and status . Nutritional status . Physical activity . Advanced directives . List of other physicians . Hospitalizations, surgeries, and ER visits in previous 12 months . Vitals . Screenings to include cognitive, depression, and falls . Referrals and appointments  In addition, I have reviewed and discussed with patient certain preventive protocols, quality metrics, and best practice recommendations. A written personalized care plan for preventive services as well as general preventive health recommendations were provided to patient.     Gerilyn Nestle, RN  02/21/2018

## 2018-02-21 ENCOUNTER — Other Ambulatory Visit: Payer: Self-pay

## 2018-02-21 ENCOUNTER — Ambulatory Visit (INDEPENDENT_AMBULATORY_CARE_PROVIDER_SITE_OTHER): Payer: PPO

## 2018-02-21 VITALS — BP 132/66 | HR 69 | Ht 72.0 in | Wt 226.0 lb

## 2018-02-21 DIAGNOSIS — Z0000 Encounter for general adult medical examination without abnormal findings: Secondary | ICD-10-CM | POA: Diagnosis not present

## 2018-02-21 NOTE — Progress Notes (Signed)
AWV reviewed and agree. Signed:  Crissie Sickles, MD           02/21/2018

## 2018-02-21 NOTE — Patient Instructions (Addendum)
Bring a copy of your living will and/or healthcare power of attorney to your next office visit.  Continue doing brain stimulating activities (puzzles, reading, adult coloring books, staying active) to keep memory sharp.    Health Maintenance, Male A healthy lifestyle and preventive care is important for your health and wellness. Ask your health care provider about what schedule of regular examinations is right for you. What should I know about weight and diet? Eat a Healthy Diet  Eat plenty of vegetables, fruits, whole grains, low-fat dairy products, and lean protein.  Do not eat a lot of foods high in solid fats, added sugars, or salt.  Maintain a Healthy Weight Regular exercise can help you achieve or maintain a healthy weight. You should:  Do at least 150 minutes of exercise each week. The exercise should increase your heart rate and make you sweat (moderate-intensity exercise).  Do strength-training exercises at least twice a week.  Watch Your Levels of Cholesterol and Blood Lipids  Have your blood tested for lipids and cholesterol every 5 years starting at 70 years of age. If you are at high risk for heart disease, you should start having your blood tested when you are 70 years old. You may need to have your cholesterol levels checked more often if: ? Your lipid or cholesterol levels are high. ? You are older than 70 years of age. ? You are at high risk for heart disease.  What should I know about cancer screening? Many types of cancers can be detected early and may often be prevented. Lung Cancer  You should be screened every year for lung cancer if: ? You are a current smoker who has smoked for at least 30 years. ? You are a former smoker who has quit within the past 15 years.  Talk to your health care provider about your screening options, when you should start screening, and how often you should be screened.  Colorectal Cancer  Routine colorectal cancer screening  usually begins at 70 years of age and should be repeated every 5-10 years until you are 70 years old. You may need to be screened more often if early forms of precancerous polyps or small growths are found. Your health care provider may recommend screening at an earlier age if you have risk factors for colon cancer.  Your health care provider may recommend using home test kits to check for hidden blood in the stool.  A small camera at the end of a tube can be used to examine your colon (sigmoidoscopy or colonoscopy). This checks for the earliest forms of colorectal cancer.  Prostate and Testicular Cancer  Depending on your age and overall health, your health care provider may do certain tests to screen for prostate and testicular cancer.  Talk to your health care provider about any symptoms or concerns you have about testicular or prostate cancer.  Skin Cancer  Check your skin from head to toe regularly.  Tell your health care provider about any new moles or changes in moles, especially if: ? There is a change in a mole's size, shape, or color. ? You have a mole that is larger than a pencil eraser.  Always use sunscreen. Apply sunscreen liberally and repeat throughout the day.  Protect yourself by wearing long sleeves, pants, a wide-brimmed hat, and sunglasses when outside.  What should I know about heart disease, diabetes, and high blood pressure?  If you are 18-39 years of age, have your blood pressure checked every   3-5 years. If you are 40 years of age or older, have your blood pressure checked every year. You should have your blood pressure measured twice-once when you are at a hospital or clinic, and once when you are not at a hospital or clinic. Record the average of the two measurements. To check your blood pressure when you are not at a hospital or clinic, you can use: ? An automated blood pressure machine at a pharmacy. ? A home blood pressure monitor.  Talk to your health care  provider about your target blood pressure.  If you are between 45-79 years old, ask your health care provider if you should take aspirin to prevent heart disease.  Have regular diabetes screenings by checking your fasting blood sugar level. ? If you are at a normal weight and have a low risk for diabetes, have this test once every three years after the age of 45. ? If you are overweight and have a high risk for diabetes, consider being tested at a younger age or more often.  A one-time screening for abdominal aortic aneurysm (AAA) by ultrasound is recommended for men aged 65-75 years who are current or former smokers. What should I know about preventing infection? Hepatitis B If you have a higher risk for hepatitis B, you should be screened for this virus. Talk with your health care provider to find out if you are at risk for hepatitis B infection. Hepatitis C Blood testing is recommended for:  Everyone born from 1945 through 1965.  Anyone with known risk factors for hepatitis C.  Sexually Transmitted Diseases (STDs)  You should be screened each year for STDs including gonorrhea and chlamydia if: ? You are sexually active and are younger than 70 years of age. ? You are older than 70 years of age and your health care provider tells you that you are at risk for this type of infection. ? Your sexual activity has changed since you were last screened and you are at an increased risk for chlamydia or gonorrhea. Ask your health care provider if you are at risk.  Talk with your health care provider about whether you are at high risk of being infected with HIV. Your health care provider may recommend a prescription medicine to help prevent HIV infection.  What else can I do?  Schedule regular health, dental, and eye exams.  Stay current with your vaccines (immunizations).  Do not use any tobacco products, such as cigarettes, chewing tobacco, and e-cigarettes. If you need help quitting, ask  your health care provider.  Limit alcohol intake to no more than 2 drinks per day. One drink equals 12 ounces of beer, 5 ounces of wine, or 1 ounces of hard liquor.  Do not use street drugs.  Do not share needles.  Ask your health care provider for help if you need support or information about quitting drugs.  Tell your health care provider if you often feel depressed.  Tell your health care provider if you have ever been abused or do not feel safe at home. This information is not intended to replace advice given to you by your health care provider. Make sure you discuss any questions you have with your health care provider. Document Released: 06/04/2008 Document Revised: 08/05/2016 Document Reviewed: 09/10/2015 Elsevier Interactive Patient Education  2018 [employer]  

## 2018-03-02 ENCOUNTER — Ambulatory Visit (INDEPENDENT_AMBULATORY_CARE_PROVIDER_SITE_OTHER): Payer: PPO | Admitting: *Deleted

## 2018-03-02 DIAGNOSIS — I719 Aortic aneurysm of unspecified site, without rupture: Secondary | ICD-10-CM | POA: Diagnosis not present

## 2018-03-02 DIAGNOSIS — Z5181 Encounter for therapeutic drug level monitoring: Secondary | ICD-10-CM

## 2018-03-02 DIAGNOSIS — Z9889 Other specified postprocedural states: Secondary | ICD-10-CM | POA: Diagnosis not present

## 2018-03-02 DIAGNOSIS — Z954 Presence of other heart-valve replacement: Secondary | ICD-10-CM

## 2018-03-02 DIAGNOSIS — I7121 Aneurysm of the ascending aorta, without rupture: Secondary | ICD-10-CM

## 2018-03-02 LAB — POCT INR: INR: 2.4

## 2018-03-02 NOTE — Patient Instructions (Signed)
Description   Continue taking  1 tablet daily except 1/2 tablet on Mondays  Wednesdays and Fridays. Recheck INR in 2 weeks. Call Coumadin clinic with any questions [phone].

## 2018-03-16 ENCOUNTER — Ambulatory Visit (INDEPENDENT_AMBULATORY_CARE_PROVIDER_SITE_OTHER): Payer: PPO | Admitting: *Deleted

## 2018-03-16 DIAGNOSIS — Z9889 Other specified postprocedural states: Secondary | ICD-10-CM

## 2018-03-16 DIAGNOSIS — I719 Aortic aneurysm of unspecified site, without rupture: Secondary | ICD-10-CM

## 2018-03-16 DIAGNOSIS — I7121 Aneurysm of the ascending aorta, without rupture: Secondary | ICD-10-CM

## 2018-03-16 DIAGNOSIS — Z954 Presence of other heart-valve replacement: Secondary | ICD-10-CM

## 2018-03-16 DIAGNOSIS — Z5181 Encounter for therapeutic drug level monitoring: Secondary | ICD-10-CM | POA: Diagnosis not present

## 2018-03-16 LAB — POCT INR: INR: 2.6

## 2018-03-16 NOTE — Patient Instructions (Signed)
Description   Tomorrow March 28th take 1/2 tablet (2.5mg ) as he has taken coumadin today then continue taking  1 tablet daily except 1/2 tablet on Mondays  Wednesdays and Fridays. Recheck INR in 2 weeks. Call Coumadin clinic with any questions [phone].

## 2018-03-17 ENCOUNTER — Other Ambulatory Visit: Payer: Self-pay | Admitting: Cardiology

## 2018-03-19 ENCOUNTER — Other Ambulatory Visit: Payer: Self-pay | Admitting: Family Medicine

## 2018-03-21 ENCOUNTER — Encounter: Payer: Self-pay | Admitting: Family Medicine

## 2018-03-21 ENCOUNTER — Ambulatory Visit (INDEPENDENT_AMBULATORY_CARE_PROVIDER_SITE_OTHER): Payer: PPO | Admitting: Family Medicine

## 2018-03-21 VITALS — BP 125/71 | HR 58 | Temp 97.6°F | Resp 16 | Ht 72.0 in | Wt 227.5 lb

## 2018-03-21 DIAGNOSIS — I10 Essential (primary) hypertension: Secondary | ICD-10-CM

## 2018-03-21 DIAGNOSIS — I5022 Chronic systolic (congestive) heart failure: Secondary | ICD-10-CM

## 2018-03-21 DIAGNOSIS — Z7901 Long term (current) use of anticoagulants: Secondary | ICD-10-CM | POA: Diagnosis not present

## 2018-03-21 DIAGNOSIS — K625 Hemorrhage of anus and rectum: Secondary | ICD-10-CM

## 2018-03-21 DIAGNOSIS — E781 Pure hyperglyceridemia: Secondary | ICD-10-CM

## 2018-03-21 LAB — COMPREHENSIVE METABOLIC PANEL
ALK PHOS: 38 U/L — AB (ref 39–117)
ALT: 23 U/L (ref 0–53)
AST: 26 U/L (ref 0–37)
Albumin: 3.7 g/dL (ref 3.5–5.2)
BILIRUBIN TOTAL: 0.6 mg/dL (ref 0.2–1.2)
BUN: 15 mg/dL (ref 6–23)
CO2: 30 mEq/L (ref 19–32)
Calcium: 9.1 mg/dL (ref 8.4–10.5)
Chloride: 108 mEq/L (ref 96–112)
Creatinine, Ser: 0.99 mg/dL (ref 0.40–1.50)
GFR: 79.47 mL/min (ref >60.00)
GLUCOSE: 113 mg/dL — AB (ref 70–99)
Potassium: 4.2 mEq/L (ref 3.5–5.1)
SODIUM: 144 meq/L (ref 135–145)
TOTAL PROTEIN: 6.4 g/dL (ref 6.0–8.3)

## 2018-03-21 LAB — LIPID PANEL
Cholesterol: 147 mg/dL (ref 0–200)
HDL: 35.2 mg/dL — ABNORMAL LOW (ref >39.00)
LDL Cholesterol: 93 mg/dL (ref 0–99)
NONHDL: 111.67
Total CHOL/HDL Ratio: 4
Triglycerides: 95 mg/dL (ref 0.0–149.0)
VLDL: 19 mg/dL (ref 0.0–40.0)

## 2018-03-21 NOTE — Progress Notes (Signed)
OFFICE VISIT  03/21/2018   CC:  Chief Complaint  Patient presents with  . Follow-up    RCI, pt is fasting.    HPI:    Patient is a 72 y.o. Caucasian male who presents for 6 mo f/u HTN, hypertriglyceridemia, and chronic systolic HF. Has is s/p mechanical AV replacement aortic root repair and MV repair Coumadin managed per coumadin clinic.  Very sedentary and poor diet lately, has gained 17 lbs in last 4 mo.   Doing better last couple weeks getting outside and being active.  HTN: no home monitoring.   HLD: taking fenofibrate.  No CP, no SOB, no dizziness. 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  Bright red blood in toilet and on tissue, about 4-5 day hx.  Painless.  No hemorrhoid noted.  No excessive straining with BMs.   Past Medical History:  Diagnosis Date  . 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  . Basal cell carcinoma    nose  . Chronic hoarseness    secondary to hx of laryngeal surgery  . GERD (gastroesophageal reflux disease)   . Headache   . Heart murmur   . History of tobacco abuse   . Hypertriglyceridemia   . Increased prostate specific antigen (PSA) velocity 09/2016   saw Dr. Jeffie Pollock 11/18/16: plan is to repeat PSA 6 wks and if still same/up then plan TRUS+ bx.  PSA's declined on recheck x 2 and as of 03/10/17 was 2.5.  Recommended annual PSA's with me, f/u with urol prn.  . 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  Marland Kitchen Left ventricular dysfunction 2018   see echo results in Surgical hx section, 01/2017 and 05/2017.  Marland Kitchen Lesion of vocal cord   . Mitral regurgitation    severe by TEE 08/2016:  MV repair done 11/03/16  . Mitral valve prolapse    MV repair 10/2016  . 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  Per CV surg, as of 02/16/17, his INR goal is 1.5-2.5.  Marland Kitchen s/p Bentall procedure for aortic root aneurysm ([place]) 11/03/2016  . 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   Complex valvuloplasty including artificial Gore-tex neochord placement x6 and 28 mm Sorin Memo 3D ring annuloplasty    Past Surgical History:  Procedure Laterality Date  . BENTALL PROCEDURE N/A 11/03/2016   Procedure: BENTALL PROCEDURE AORTIC ROOT REPLACEMENT + mechanical AV;  Surgeon: Rexene Alberts, MD;  Location: [place];  Service: Open Heart Surgery;  Laterality: N/A;  . CARDIAC CATHETERIZATION N/A 09/29/2016   Clean coronaries, EF 60-65%.  Procedure: Right/Left Heart Cath and Coronary Angiography;  Surgeon: Jettie Booze, MD;  Location: [place] [facility];  Service: Cardiovascular;  Laterality: N/A;  . CHOLECYSTECTOMY  1980  . COLONOSCOPY  2003 and 2013   Both normal.  . DIRECT LARYNGOSCOPY Left 10/17/2015   Procedure: MICRO DIRECT LARYNGOSCOPY WITH FROZEN SECTION;  Surgeon: Jodi Marble, MD;  Location: [place];  Service: ENT;  Laterality: Left;  . MITRAL VALVE REPAIR N/A 11/03/2016   Procedure: MITRAL VALVE REPAIR (MVR);  Surgeon: Rexene Alberts, MD;  Location: [place];  Service: Open Heart Surgery;  Laterality: N/A;  . PERIPHERAL VASCULAR CATHETERIZATION N/A 09/29/2016   Procedure: Abdominal Aortogram;  Surgeon: Jettie Booze, MD;  Location: [place] [facility];  Service: Cardiovascular;  Laterality: N/A;  . PERIPHERAL VASCULAR CATHETERIZATION N/A 09/29/2016   Procedure: Thoracic Aortogram;  Surgeon: Jettie Booze, MD;  Location: [place] [facility];  Service: Cardiovascular;  Laterality: N/A;  . TEE WITHOUT CARDIOVERSION N/A 09/17/2016   Procedure: TRANSESOPHAGEAL ECHOCARDIOGRAM (TEE);  Surgeon: Jerline Pain, MD;  Location: [place];  Service: Cardiovascular;  Laterality: N/A;   . TEE WITHOUT CARDIOVERSION N/A 11/03/2016   Procedure: TRANSESOPHAGEAL ECHOCARDIOGRAM (TEE);  Surgeon: Rexene Alberts, MD;  Location: [place];  Service: Open Heart Surgery;  Laterality: N/A;  . TONSILECTOMY/ADENOIDECTOMY WITH MYRINGOTOMY  1960  . 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  Mod/severe AR w/functional bicuspid AV.  EF 45%.  . TRANSTHORACIC ECHOCARDIOGRAM  07/2015   Bicuspid aortic valve + mod mitral regurg  . TRANSTHORACIC ECHOCARDIOGRAM  02/01/2017   (Post aortic root/valve surgery): EF 35%, diffuse hypokinesis, LV dilatation, grade I DD. Cardiologist added ARB and increased his BB dose.  Lasix prn added 06/2017 due to some LE edema.  . TRANSTHORACIC ECHOCARDIOGRAM  05/2017   LVEF 45-50%.  Grd II DD.  Mechanical AV good, MV repair good.  . Vocal cord biopsy  09/2015   No atypia or inflammation.  Some fibrous changes that are possibly from hx of radiation therapy.    Outpatient Medications Prior to Visit  Medication Sig Dispense Refill  . acetaminophen (TYLENOL) 500 MG tablet Take 500-1,000 mg by mouth every 6 (six) hours as needed for mild pain (depends on pain level if takes 500 mg or 1000 mg).     Marland Kitchen aspirin EC 81 MG EC tablet Take 1 tablet (81 mg total) by mouth daily.    . fenofibrate (TRICOR) 145 MG tablet TAKE 1 TABLET(145 MG) BY MOUTH DAILY 90 tablet 3  . furosemide (LASIX) 20 MG tablet Take 20 mg by mouth daily as needed for fluid or edema (2 or more pounds).    . losartan (COZAAR) 100 MG tablet Take 100 mg by mouth daily.    . metoprolol succinate (TOPROL-XL) 50 MG 24 hr tablet Take 1 tablet (50 mg total) by mouth daily. 90 tablet 3  . Multiple Vitamins-Minerals (MULTIVITAMIN PO) Take 1 tablet by mouth daily.     . pantoprazole (PROTONIX) 40 MG tablet TAKE 1 TABLET (40 MG TOTAL) BY MOUTH DAILY. 90 tablet 1  . warfarin (COUMADIN) 5 MG tablet TAKE AS DIRECTED BY COUMADIN CLINIC 30 tablet 3   No facility-administered medications prior  to visit.     No Known Allergies  ROS As per HPI  PE: Blood pressure 125/71, pulse (!) 58, temperature 97.6 F (36.4 C), temperature source Oral, resp. rate 16, height 6' (1.829 m), weight 227 lb 8 oz (103.2 kg), SpO2 96 %. Gen: Alert, well appearing.  Patient is oriented to person, place, time, and situation. AFFECT: pleasant, lucid thought and speech. CV: RRR, S1 normal, mechanical S2, no murmur. EXT: 3+ L LL edema, 2+ RLL edema. Rectal: 1 small, non-inflamed, soft, non-tender ext hemorrhoid.  No fissure or mass.  Internally I cannot feel any mass or stool. No stool on glove.  LABS:  Lab Results  Component Value Date   TSH 1.71 11/24/2016   Lab Results  Component Value Date   WBC 6.7 06/16/2017   HGB 13.9 06/16/2017   HCT 40.1  06/16/2017   MCV 80 06/16/2017   PLT 216 06/16/2017   Lab Results  Component Value Date   CREATININE 0.86 06/16/2017   BUN 18 06/16/2017   NA 140 06/16/2017   K 4.4 06/16/2017   CL 104 06/16/2017   CO2 22 06/16/2017   Lab Results  Component Value Date   ALT 20 10/30/2016   AST 25 10/30/2016   ALKPHOS 40 10/30/2016   BILITOT 0.6 10/30/2016   Lab Results  Component Value Date   CHOL 135 09/24/2016   Lab Results  Component Value Date   HDL 40.30 09/24/2016   Lab Results  Component Value Date   LDLCALC 82 09/24/2016   Lab Results  Component Value Date   TRIG 67.0 09/24/2016   Lab Results  Component Value Date   CHOLHDL 3 09/24/2016   Lab Results  Component Value Date   PSA 2.73 09/20/2017   PSA 2.50 03/10/2017   PSA 4.17 (H) 09/24/2016   Lab Results  Component Value Date   HGBA1C 5.5 10/30/2016   Lab Results  Component Value Date   INR 2.6 03/16/2018   INR 2.4 03/02/2018   INR 3.6 02/16/2018    IMPRESSION AND PLAN:  1) HTN: The current medical regimen is effective;  continue present plan and medications. Lytes/cr today.  2) Hypertriglyceridemia: tolerating tricor. FLP and AST/ALT today.  3) Chronic syst  HF: Lower extremities edematous but no sign of pulm edema and not symptomatic with moderate activity. Lytes/cr today.  Encouraged pt to use his lasix on prn basis as he has in the past (not taking this lately).  4) BRBPR: likely outlet bleeding + anticoag with coumadin.  Last INR 03/16/18 was 2.6. Signs/symptoms to call or return for were reviewed and pt expressed understanding. Take colace 2 tabs qd for the next week or so.  An After Visit Summary was printed and given to the patient.   FOLLOW UP: Return in about 6 months (around 09/20/2018) for annual CPE (fasting).  Signed:  Crissie Sickles, MD           03/21/2018

## 2018-03-22 ENCOUNTER — Other Ambulatory Visit (INDEPENDENT_AMBULATORY_CARE_PROVIDER_SITE_OTHER): Payer: PPO

## 2018-03-22 DIAGNOSIS — R7301 Impaired fasting glucose: Secondary | ICD-10-CM | POA: Diagnosis not present

## 2018-03-22 LAB — HEMOGLOBIN A1C: Hgb A1c MFr Bld: 5.6 % (ref 4.6–6.5)

## 2018-03-30 ENCOUNTER — Ambulatory Visit (INDEPENDENT_AMBULATORY_CARE_PROVIDER_SITE_OTHER): Payer: PPO | Admitting: *Deleted

## 2018-03-30 DIAGNOSIS — Z954 Presence of other heart-valve replacement: Secondary | ICD-10-CM | POA: Diagnosis not present

## 2018-03-30 DIAGNOSIS — Z9889 Other specified postprocedural states: Secondary | ICD-10-CM

## 2018-03-30 DIAGNOSIS — Z5181 Encounter for therapeutic drug level monitoring: Secondary | ICD-10-CM

## 2018-03-30 DIAGNOSIS — I719 Aortic aneurysm of unspecified site, without rupture: Secondary | ICD-10-CM | POA: Diagnosis not present

## 2018-03-30 DIAGNOSIS — I7121 Aneurysm of the ascending aorta, without rupture: Secondary | ICD-10-CM

## 2018-03-30 LAB — POCT INR: INR: 2.7

## 2018-03-30 NOTE — Patient Instructions (Signed)
Description   Start taking 1/2 tablet daily except 1 tablet on Tuesday, Thursday, and Saturday. Recheck INR in 2 weeks. Call Coumadin clinic with any questions [phone].

## 2018-04-13 ENCOUNTER — Ambulatory Visit: Payer: PPO | Admitting: *Deleted

## 2018-04-13 DIAGNOSIS — Z5181 Encounter for therapeutic drug level monitoring: Secondary | ICD-10-CM

## 2018-04-13 DIAGNOSIS — Z954 Presence of other heart-valve replacement: Secondary | ICD-10-CM | POA: Diagnosis not present

## 2018-04-13 DIAGNOSIS — I7121 Aneurysm of the ascending aorta, without rupture: Secondary | ICD-10-CM

## 2018-04-13 DIAGNOSIS — Z9889 Other specified postprocedural states: Secondary | ICD-10-CM

## 2018-04-13 DIAGNOSIS — I719 Aortic aneurysm of unspecified site, without rupture: Secondary | ICD-10-CM

## 2018-04-13 LAB — POCT INR: INR: 2.9

## 2018-04-13 NOTE — Patient Instructions (Signed)
Description   Eat a large serving of a leafy green today. Start taking 1/2 tablet daily except 1 tablet on Tuesday  and Saturday. Recheck INR in 2 weeks. Call Coumadin clinic with any questions [phone].

## 2018-04-27 ENCOUNTER — Ambulatory Visit: Payer: PPO | Admitting: Pharmacist

## 2018-04-27 DIAGNOSIS — Z9889 Other specified postprocedural states: Secondary | ICD-10-CM

## 2018-04-27 DIAGNOSIS — Z954 Presence of other heart-valve replacement: Secondary | ICD-10-CM | POA: Diagnosis not present

## 2018-04-27 DIAGNOSIS — I719 Aortic aneurysm of unspecified site, without rupture: Secondary | ICD-10-CM

## 2018-04-27 DIAGNOSIS — Z5181 Encounter for therapeutic drug level monitoring: Secondary | ICD-10-CM

## 2018-04-27 DIAGNOSIS — I7121 Aneurysm of the ascending aorta, without rupture: Secondary | ICD-10-CM

## 2018-04-27 LAB — POCT INR: INR: 2

## 2018-04-27 NOTE — Patient Instructions (Signed)
Description   Continue taking 1/2 tablet daily except 1 tablet on Tuesdays and Saturdays. Recheck INR in 3-4 weeks. Call Coumadin clinic with any questions [phone].

## 2018-05-11 ENCOUNTER — Other Ambulatory Visit: Payer: Self-pay | Admitting: Physician Assistant

## 2018-05-25 ENCOUNTER — Ambulatory Visit: Payer: PPO | Admitting: *Deleted

## 2018-05-25 DIAGNOSIS — Z9889 Other specified postprocedural states: Secondary | ICD-10-CM

## 2018-05-25 DIAGNOSIS — Z954 Presence of other heart-valve replacement: Secondary | ICD-10-CM

## 2018-05-25 DIAGNOSIS — I719 Aortic aneurysm of unspecified site, without rupture: Secondary | ICD-10-CM | POA: Diagnosis not present

## 2018-05-25 DIAGNOSIS — Z5181 Encounter for therapeutic drug level monitoring: Secondary | ICD-10-CM | POA: Diagnosis not present

## 2018-05-25 DIAGNOSIS — I7121 Aneurysm of the ascending aorta, without rupture: Secondary | ICD-10-CM

## 2018-05-25 LAB — POCT INR: INR: 2 (ref 2.0–3.0)

## 2018-05-25 NOTE — Patient Instructions (Signed)
Description   Continue taking 1/2 tablet daily except 1 tablet on Tuesdays and Saturdays. Recheck INR in 4 weeks. Call Coumadin clinic with any questions [phone].

## 2018-06-03 ENCOUNTER — Other Ambulatory Visit: Payer: Self-pay | Admitting: Nurse Practitioner

## 2018-06-16 ENCOUNTER — Other Ambulatory Visit: Payer: Self-pay | Admitting: Family Medicine

## 2018-06-22 ENCOUNTER — Ambulatory Visit: Payer: PPO | Admitting: *Deleted

## 2018-06-22 DIAGNOSIS — Z954 Presence of other heart-valve replacement: Secondary | ICD-10-CM

## 2018-06-22 DIAGNOSIS — Z9889 Other specified postprocedural states: Secondary | ICD-10-CM | POA: Diagnosis not present

## 2018-06-22 DIAGNOSIS — Z5181 Encounter for therapeutic drug level monitoring: Secondary | ICD-10-CM

## 2018-06-22 DIAGNOSIS — I719 Aortic aneurysm of unspecified site, without rupture: Secondary | ICD-10-CM | POA: Diagnosis not present

## 2018-06-22 DIAGNOSIS — I7121 Aneurysm of the ascending aorta, without rupture: Secondary | ICD-10-CM

## 2018-06-22 LAB — POCT INR: INR: 2.1 (ref 2.0–3.0)

## 2018-06-22 NOTE — Patient Instructions (Signed)
Description   Continue taking 1/2 tablet daily except 1 tablet on Tuesdays and Saturdays. Recheck INR in 5 weeks. Call Coumadin clinic with any questions [phone].

## 2018-06-28 ENCOUNTER — Other Ambulatory Visit: Payer: Self-pay | Admitting: Nurse Practitioner

## 2018-07-26 ENCOUNTER — Other Ambulatory Visit: Payer: Self-pay | Admitting: Nurse Practitioner

## 2018-07-27 ENCOUNTER — Ambulatory Visit: Payer: PPO | Admitting: *Deleted

## 2018-07-27 DIAGNOSIS — I719 Aortic aneurysm of unspecified site, without rupture: Secondary | ICD-10-CM

## 2018-07-27 DIAGNOSIS — I7121 Aneurysm of the ascending aorta, without rupture: Secondary | ICD-10-CM

## 2018-07-27 DIAGNOSIS — Z5181 Encounter for therapeutic drug level monitoring: Secondary | ICD-10-CM | POA: Diagnosis not present

## 2018-07-27 DIAGNOSIS — Z9889 Other specified postprocedural states: Secondary | ICD-10-CM

## 2018-07-27 DIAGNOSIS — Z954 Presence of other heart-valve replacement: Secondary | ICD-10-CM | POA: Diagnosis not present

## 2018-07-27 LAB — POCT INR: INR: 1.9 — AB (ref 2.0–3.0)

## 2018-07-27 NOTE — Patient Instructions (Signed)
Description   Today take 1 tablet then continue taking 1/2 tablet daily except 1 tablet on Tuesdays and Saturdays. Recheck INR in 4 weeks. Call Coumadin clinic with any questions [phone].

## 2018-08-07 ENCOUNTER — Other Ambulatory Visit: Payer: Self-pay | Admitting: Physician Assistant

## 2018-08-23 ENCOUNTER — Other Ambulatory Visit: Payer: Self-pay | Admitting: Nurse Practitioner

## 2018-08-24 ENCOUNTER — Ambulatory Visit: Payer: PPO | Admitting: *Deleted

## 2018-08-24 DIAGNOSIS — I719 Aortic aneurysm of unspecified site, without rupture: Secondary | ICD-10-CM | POA: Diagnosis not present

## 2018-08-24 DIAGNOSIS — Z5181 Encounter for therapeutic drug level monitoring: Secondary | ICD-10-CM

## 2018-08-24 DIAGNOSIS — I7121 Aneurysm of the ascending aorta, without rupture: Secondary | ICD-10-CM

## 2018-08-24 DIAGNOSIS — Z954 Presence of other heart-valve replacement: Secondary | ICD-10-CM | POA: Diagnosis not present

## 2018-08-24 DIAGNOSIS — Z9889 Other specified postprocedural states: Secondary | ICD-10-CM | POA: Diagnosis not present

## 2018-08-24 LAB — POCT INR: INR: 1.8 — AB (ref 2.0–3.0)

## 2018-08-24 NOTE — Patient Instructions (Signed)
Description   Today take 1 tablet then start taking 1/2 tablet daily except 1 tablet on Tuesdays, Thursdays, and Saturdays. Recheck INR in 3 weeks. Call Coumadin clinic with any questions [phone].

## 2018-09-05 ENCOUNTER — Ambulatory Visit: Payer: PPO | Admitting: Cardiology

## 2018-09-13 ENCOUNTER — Other Ambulatory Visit: Payer: Self-pay | Admitting: Family Medicine

## 2018-09-14 ENCOUNTER — Ambulatory Visit: Payer: PPO | Admitting: *Deleted

## 2018-09-14 DIAGNOSIS — I719 Aortic aneurysm of unspecified site, without rupture: Secondary | ICD-10-CM | POA: Diagnosis not present

## 2018-09-14 DIAGNOSIS — Z9889 Other specified postprocedural states: Secondary | ICD-10-CM | POA: Diagnosis not present

## 2018-09-14 DIAGNOSIS — Z5181 Encounter for therapeutic drug level monitoring: Secondary | ICD-10-CM

## 2018-09-14 DIAGNOSIS — Z954 Presence of other heart-valve replacement: Secondary | ICD-10-CM | POA: Diagnosis not present

## 2018-09-14 DIAGNOSIS — I7121 Aneurysm of the ascending aorta, without rupture: Secondary | ICD-10-CM

## 2018-09-14 LAB — POCT INR: INR: 2.8 (ref 2.0–3.0)

## 2018-09-14 NOTE — Patient Instructions (Signed)
Description   Skip tomorrow's dose, then continue taking 1/2 tablet daily except 1 tablet on Tuesdays, Thursdays, and Saturdays. Recheck INR in 2 weeks. Call Coumadin clinic with any questions [phone].

## 2018-09-17 ENCOUNTER — Other Ambulatory Visit: Payer: Self-pay | Admitting: Nurse Practitioner

## 2018-09-19 ENCOUNTER — Other Ambulatory Visit: Payer: Self-pay

## 2018-09-19 MED ORDER — WARFARIN SODIUM 5 MG PO TABS: ORAL_TABLET | ORAL | 1 refills | Status: DC

## 2018-09-20 ENCOUNTER — Ambulatory Visit (INDEPENDENT_AMBULATORY_CARE_PROVIDER_SITE_OTHER): Payer: PPO | Admitting: Family Medicine

## 2018-09-20 ENCOUNTER — Encounter: Payer: Self-pay | Admitting: Family Medicine

## 2018-09-20 VITALS — BP 105/64 | HR 57 | Temp 97.7°F | Resp 16 | Ht 72.5 in | Wt 226.2 lb

## 2018-09-20 DIAGNOSIS — Z125 Encounter for screening for malignant neoplasm of prostate: Secondary | ICD-10-CM | POA: Diagnosis not present

## 2018-09-20 DIAGNOSIS — Z23 Encounter for immunization: Secondary | ICD-10-CM

## 2018-09-20 DIAGNOSIS — E66811 Obesity, class 1: Secondary | ICD-10-CM

## 2018-09-20 DIAGNOSIS — E669 Obesity, unspecified: Secondary | ICD-10-CM | POA: Diagnosis not present

## 2018-09-20 DIAGNOSIS — Z7901 Long term (current) use of anticoagulants: Secondary | ICD-10-CM

## 2018-09-20 DIAGNOSIS — Z0000 Encounter for general adult medical examination without abnormal findings: Secondary | ICD-10-CM

## 2018-09-20 DIAGNOSIS — I5042 Chronic combined systolic (congestive) and diastolic (congestive) heart failure: Secondary | ICD-10-CM

## 2018-09-20 LAB — COMPREHENSIVE METABOLIC PANEL
ALBUMIN: 4.1 g/dL (ref 3.5–5.2)
ALT: 23 U/L (ref 0–53)
AST: 25 U/L (ref 0–37)
Alkaline Phosphatase: 41 U/L (ref 39–117)
BILIRUBIN TOTAL: 0.6 mg/dL (ref 0.2–1.2)
BUN: 17 mg/dL (ref 6–23)
CALCIUM: 9.3 mg/dL (ref 8.4–10.5)
CO2: 30 mEq/L (ref 19–32)
Chloride: 107 mEq/L (ref 96–112)
Creatinine, Ser: 1.07 mg/dL (ref 0.40–1.50)
GFR: 72.55 mL/min (ref >60.00)
Glucose, Bld: 101 mg/dL — ABNORMAL HIGH (ref 70–99)
Potassium: 4.2 mEq/L (ref 3.5–5.1)
SODIUM: 143 meq/L (ref 135–145)
Total Protein: 6.7 g/dL (ref 6.0–8.3)

## 2018-09-20 LAB — CBC WITH DIFFERENTIAL/PLATELET
BASOS PCT: 0.3 % (ref 0.0–3.0)
Basophils Absolute: 0 10*3/uL (ref 0.0–0.1)
EOS PCT: 1.9 % (ref 0.0–5.0)
Eosinophils Absolute: 0.1 10*3/uL (ref 0.0–0.7)
HCT: 43.1 % (ref 39.0–52.0)
Hemoglobin: 14.5 g/dL (ref 13.0–17.0)
LYMPHS ABS: 2.2 10*3/uL (ref 0.7–4.0)
Lymphocytes Relative: 36.9 % (ref 12.0–46.0)
MCHC: 33.7 g/dL (ref 30.0–36.0)
MCV: 85.6 fl (ref 78.0–100.0)
MONO ABS: 0.5 10*3/uL (ref 0.1–1.0)
Monocytes Relative: 7.8 % (ref 3.0–12.0)
NEUTROS ABS: 3.2 10*3/uL (ref 1.4–7.7)
NEUTROS PCT: 53.1 % (ref 43.0–77.0)
PLATELETS: 212 10*3/uL (ref 150.0–400.0)
RBC: 5.04 Mil/uL (ref 4.22–5.81)
RDW: 14.2 % (ref 11.5–15.5)
WBC: 6.1 10*3/uL (ref 4.0–10.5)

## 2018-09-20 LAB — LIPID PANEL
CHOLESTEROL: 161 mg/dL (ref 0–200)
HDL: 40.4 mg/dL (ref >39.00)
LDL CALC: 100 mg/dL — AB (ref 0–99)
NonHDL: 120.7
TRIGLYCERIDES: 104 mg/dL (ref 0.0–149.0)
Total CHOL/HDL Ratio: 4
VLDL: 20.8 mg/dL (ref 0.0–40.0)

## 2018-09-20 LAB — PSA, MEDICARE: PSA: 3.32 ng/mL (ref 0.10–4.00)

## 2018-09-20 MED ORDER — ZOSTER VAC RECOMB ADJUVANTED 50 MCG/0.5ML IM SUSR: 0.5000 mL | Freq: Once | INTRAMUSCULAR | 1 refills | Status: AC

## 2018-09-20 MED ORDER — PANTOPRAZOLE SODIUM 40 MG PO TBEC: 40.0000 mg | DELAYED_RELEASE_TABLET | Freq: Every day | ORAL | 1 refills | Status: DC

## 2018-09-20 MED ORDER — FENOFIBRATE 145 MG PO TABS: ORAL_TABLET | ORAL | 1 refills | Status: DC

## 2018-09-20 MED ORDER — FUROSEMIDE 20 MG PO TABS: 20.0000 mg | ORAL_TABLET | Freq: Every day | ORAL | 1 refills | Status: DC | PRN

## 2018-09-20 NOTE — Patient Instructions (Addendum)
Take one of your 20mg  lasix (furosemide) tabs today to help relieve some of your lower extremity swelling.   Health Maintenance, Male A healthy lifestyle and preventive care is important for your health and wellness. Ask your health care provider about what schedule of regular examinations is right for you. What should I know about weight and diet? Eat a Healthy Diet  Eat plenty of vegetables, fruits, whole grains, low-fat dairy products, and lean protein.  Do not eat a lot of foods high in solid fats, added sugars, or salt.  Maintain a Healthy Weight Regular exercise can help you achieve or maintain a healthy weight. You should:  Do at least 150 minutes of exercise each week. The exercise should increase your heart rate and make you sweat (moderate-intensity exercise).  Do strength-training exercises at least twice a week.  Watch Your Levels of Cholesterol and Blood Lipids  Have your blood tested for lipids and cholesterol every 5 years starting at 70 years of age. If you are at high risk for heart disease, you should start having your blood tested when you are 70 years old. You may need to have your cholesterol levels checked more often if: ? Your lipid or cholesterol levels are high. ? You are older than 70 years of age. ? You are at high risk for heart disease.  What should I know about cancer screening? Many types of cancers can be detected early and may often be prevented. Lung Cancer  You should be screened every year for lung cancer if: ? You are a current smoker who has smoked for at least 30 years. ? You are a former smoker who has quit within the past 15 years.  Talk to your health care provider about your screening options, when you should start screening, and how often you should be screened.  Colorectal Cancer  Routine colorectal cancer screening usually begins at 70 years of age and should be repeated every 5-10 years until you are 70 years old. You may need to be  screened more often if early forms of precancerous polyps or small growths are found. Your health care provider may recommend screening at an earlier age if you have risk factors for colon cancer.  Your health care provider may recommend using home test kits to check for hidden blood in the stool.  A small camera at the end of a tube can be used to examine your colon (sigmoidoscopy or colonoscopy). This checks for the earliest forms of colorectal cancer.  Prostate and Testicular Cancer  Depending on your age and overall health, your health care provider may do certain tests to screen for prostate and testicular cancer.  Talk to your health care provider about any symptoms or concerns you have about testicular or prostate cancer.  Skin Cancer  Check your skin from head to toe regularly.  Tell your health care provider about any new moles or changes in moles, especially if: ? There is a change in a mole's size, shape, or color. ? You have a mole that is larger than a pencil eraser.  Always use sunscreen. Apply sunscreen liberally and repeat throughout the day.  Protect yourself by wearing long sleeves, pants, a wide-brimmed hat, and sunglasses when outside.  What should I know about heart disease, diabetes, and high blood pressure?  If you are 34-45 years of age, have your blood pressure checked every 3-5 years. If you are 17 years of age or older, have your blood pressure checked every  year. You should have your blood pressure measured twice-once when you are at a hospital or clinic, and once when you are not at a hospital or clinic. Record the average of the two measurements. To check your blood pressure when you are not at a hospital or clinic, you can use: ? An automated blood pressure machine at a pharmacy. ? A home blood pressure monitor.  Talk to your health care provider about your target blood pressure.  If you are between 10-30 years old, ask your health care provider if you  should take aspirin to prevent heart disease.  Have regular diabetes screenings by checking your fasting blood sugar level. ? If you are at a normal weight and have a low risk for diabetes, have this test once every three years after the age of 46. ? If you are overweight and have a high risk for diabetes, consider being tested at a younger age or more often.  A one-time screening for abdominal aortic aneurysm (AAA) by ultrasound is recommended for men aged 67-75 years who are current or former smokers. What should I know about preventing infection? Hepatitis B If you have a higher risk for hepatitis B, you should be screened for this virus. Talk with your health care provider to find out if you are at risk for hepatitis B infection. Hepatitis C Blood testing is recommended for:  Everyone born from [birth year] through 1963.  Anyone with known risk factors for hepatitis C.  Sexually Transmitted Diseases (STDs)  You should be screened each year for STDs including gonorrhea and chlamydia if: ? You are sexually active and are younger than 70 years of age. ? You are older than 70 years of age and your health care provider tells you that you are at risk for this type of infection. ? Your sexual activity has changed since you were last screened and you are at an increased risk for chlamydia or gonorrhea. Ask your health care provider if you are at risk.  Talk with your health care provider about whether you are at high risk of being infected with HIV. Your health care provider may recommend a prescription medicine to help prevent HIV infection.  What else can I do?  Schedule regular health, dental, and eye exams.  Stay current with your vaccines (immunizations).  Do not use any tobacco products, such as cigarettes, chewing tobacco, and e-cigarettes. If you need help quitting, ask your health care provider.  Limit alcohol intake to no more than 2 drinks per day. One drink equals 12 ounces of beer,  5 ounces of wine, or 1 ounces of hard liquor.  Do not use street drugs.  Do not share needles.  Ask your health care provider for help if you need support or information about quitting drugs.  Tell your health care provider if you often feel depressed.  Tell your health care provider if you have ever been abused or do not feel safe at home. This information is not intended to replace advice given to you by your health care provider. Make sure you discuss any questions you have with your health care provider. Document Released: 06/04/2008 Document Revised: 08/05/2016 Document Reviewed: 09/10/2015 Elsevier Interactive Patient Education  [employer].

## 2018-09-20 NOTE — Progress Notes (Signed)
Office Note 09/20/2018  CC:  Chief Complaint  Patient presents with  . Annual Exam    Pt is fasting.     HPI:  Kenneth Ryan is a 72 y.o. White male who is here for annual health maintenance exam. Has f/u with cardiologist tomorrow.  Exercise: walks, no DOE or CP.   Diet: low sodium, heart healthy.  At vacation recently he did eat a lot of salted pretzels and has some increased LE swelling from this. Alc: drinks 6 bottles of beer per week. Tob: no tobacco  Past Medical History:  Diagnosis Date  . 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  . Basal cell carcinoma    nose  . Chronic hoarseness    secondary to hx of laryngeal surgery  . GERD (gastroesophageal reflux disease)   . Headache   . Heart murmur   . History of tobacco abuse   . Hypertriglyceridemia   . Increased prostate specific antigen (PSA) velocity 09/2016   saw Dr. Jeffie Pollock 11/18/16: plan is to repeat PSA 6 wks and if still same/up then plan TRUS+ bx.  PSA's declined on recheck x 2 and as of 03/10/17 was 2.5.  Recommended annual PSA's with me, f/u with urol prn.  . 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  Marland Kitchen Left ventricular dysfunction 2018   see echo results in Surgical hx section, 01/2017 and 05/2017.  Marland Kitchen Lesion of vocal cord   . Mitral regurgitation    severe by TEE 08/2016:  MV repair done 11/03/16  . Mitral valve prolapse    MV repair 10/2016  . 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  Per CV surg, as of 02/16/17, his INR goal is 1.5-2.5.  Marland Kitchen s/p Bentall procedure for aortic root aneurysm ([place]) 11/03/2016  .  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   Complex valvuloplasty including artificial Gore-tex neochord placement x6 and 28 mm Sorin Memo 3D ring annuloplasty    Past Surgical History:  Procedure Laterality Date  . BENTALL PROCEDURE N/A 11/03/2016   Procedure: BENTALL PROCEDURE AORTIC ROOT REPLACEMENT + mechanical AV;  Surgeon: Rexene Alberts, MD;  Location: [place];  Service: Open Heart Surgery;  Laterality: N/A;  . CARDIAC CATHETERIZATION N/A 09/29/2016   Clean coronaries, EF 60-65%.  Procedure: Right/Left Heart Cath and Coronary Angiography;  Surgeon: Jettie Booze, MD;  Location: [place] [facility];  Service: Cardiovascular;  Laterality: N/A;  . CHOLECYSTECTOMY  1980  . COLONOSCOPY  2003 and 2013   Both normal.  . DIRECT LARYNGOSCOPY Left 10/17/2015   Procedure: MICRO DIRECT LARYNGOSCOPY WITH FROZEN SECTION;  Surgeon: Jodi Marble, MD;  Location: [place];  Service: ENT;  Laterality: Left;  . MITRAL VALVE REPAIR N/A 11/03/2016   Procedure: MITRAL VALVE REPAIR (MVR);  Surgeon: Rexene Alberts, MD;  Location: [place];  Service: Open Heart Surgery;  Laterality: N/A;  . PERIPHERAL VASCULAR CATHETERIZATION N/A 09/29/2016   Procedure: Abdominal Aortogram;  Surgeon: Jettie Booze, MD;  Location: [place] [facility];  Service: Cardiovascular;  Laterality: N/A;  . PERIPHERAL VASCULAR CATHETERIZATION N/A 09/29/2016   Procedure: Thoracic Aortogram;  Surgeon:  Jettie Booze, MD;  Location: [place] [facility];  Service: Cardiovascular;  Laterality: N/A;  . TEE WITHOUT CARDIOVERSION N/A 09/17/2016   Procedure: TRANSESOPHAGEAL ECHOCARDIOGRAM (TEE);  Surgeon: Jerline Pain, MD;  Location: [place];  Service: Cardiovascular;  Laterality: N/A;  . TEE WITHOUT CARDIOVERSION N/A 11/03/2016   Procedure: TRANSESOPHAGEAL ECHOCARDIOGRAM (TEE);  Surgeon: Rexene Alberts, MD;  Location: [place];  Service: Open Heart Surgery;  Laterality: N/A;  . TONSILECTOMY/ADENOIDECTOMY WITH MYRINGOTOMY  1960  .  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  Mod/severe AR w/functional bicuspid AV.  EF 45%.  . TRANSTHORACIC ECHOCARDIOGRAM  07/2015   Bicuspid aortic valve + mod mitral regurg  . TRANSTHORACIC ECHOCARDIOGRAM  02/01/2017   (Post aortic root/valve surgery): EF 35%, diffuse hypokinesis, LV dilatation, grade I DD. Cardiologist added ARB and increased his BB dose.  Lasix prn added 06/2017 due to some LE edema.  . TRANSTHORACIC ECHOCARDIOGRAM  05/2017   LVEF 45-50%.  Grd II DD.  Mechanical AV good, MV repair good.  . Vocal cord biopsy  09/2015   No atypia or inflammation.  Some fibrous changes that are possibly from hx of radiation therapy.    Family History  Problem Relation Age of Onset  . Heart disease Father   . Breast cancer Mother   . Diabetes Sister     Social History   Socioeconomic History  . Marital status: Married    Spouse name: Not on file  . Number of children: Not on file  . Years of education: Not on file  . Highest education level: Not on file  Occupational History  . Not on file  Social Needs  . Financial resource strain: Not on file  . Food insecurity:    Worry: Not on file    Inability: Not on file  . Transportation needs:    Medical: Not on file    Non-medical: Not on file  Tobacco Use  . Smoking status: Former Smoker    Last attempt to quit: 07/22/1979    Years since quitting: 39.1  . Smokeless tobacco: Never Used  Substance and Sexual Activity  . Alcohol use: Yes    Comment: rare  . Drug use: No  . Sexual activity: Not on file  Lifestyle  . Physical activity:    Days per week: Not on file    Minutes per session: Not on file  . Stress: Not on file  Relationships  . Social connections:    Talks on phone: Not on file    Gets together: Not on file    Attends religious service: Not on file    Active member of club or organization: Not on file    Attends meetings of clubs or organizations: Not on file     Relationship status: Not on file  . Intimate partner violence:    Fear of current or ex partner: Not on file    Emotionally abused: Not on file    Physically abused: Not on file    Forced sexual activity: Not on file  Other Topics Concern  . Not on file  Social History Narrative   Married, one son deceased 2011/03/27.   Relocated from [place] to [facility] 06/2013 to be near grandchildren.   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   Smoker 20 pack-yr hx: quit about 1980.   Alcohol: 3 bottles of beer per week avg.     Drugs: none  Outpatient Medications Prior to Visit  Medication Sig Dispense Refill  . acetaminophen (TYLENOL) 500 MG tablet Take 500-1,000 mg by mouth every 6 (six) hours as needed for mild pain (depends on pain level if takes 500 mg or 1000 mg).     Marland Kitchen aspirin EC 81 MG EC tablet Take 1 tablet (81 mg total) by mouth daily.    . fenofibrate (TRICOR) 145 MG tablet TAKE 1 TABLET(145 MG) BY MOUTH DAILY 90 tablet 1  . furosemide (LASIX) 20 MG tablet Take 20 mg by mouth daily as needed for fluid or edema (2 or more pounds).    . losartan (COZAAR) 100 MG tablet Take 1 tablet (100 mg total) by mouth daily. Please keep upcoming appt in October for future refills. Thank you 30 tablet 0  . losartan (COZAAR) 100 MG tablet TAKE 1 TABLET BY MOUTH EVERY DAY 30 tablet 0  . metoprolol succinate (TOPROL-XL) 50 MG 24 hr tablet Take 1 tablet (50 mg total) by mouth daily. Please keep upcoming appt for future refills. 90 tablet 0  . Multiple Vitamins-Minerals (MULTIVITAMIN PO) Take 1 tablet by mouth daily.     . pantoprazole (PROTONIX) 40 MG tablet TAKE 1 TABLET (40 MG TOTAL) BY MOUTH DAILY. 90 tablet 1  . warfarin (COUMADIN) 5 MG tablet Take as directed by [facility] 90 tablet 1   No facility-administered medications prior to visit.     No Known Allergies  ROS Review of Systems  Constitutional: Negative for appetite change, chills, fatigue and fever.  HENT: Negative for congestion, dental  problem, ear pain and sore throat.   Eyes: Negative for discharge, redness and visual disturbance.  Respiratory: Negative for cough, chest tightness, shortness of breath and wheezing.   Cardiovascular: Positive for leg swelling (bilat x 1 week-->after eating excessive Na). Negative for chest pain and palpitations.  Gastrointestinal: Negative for abdominal pain, blood in stool, diarrhea, nausea and vomiting.  Genitourinary: Negative for difficulty urinating, dysuria, flank pain, frequency, hematuria and urgency.  Musculoskeletal: Negative for arthralgias, back pain, joint swelling, myalgias and neck stiffness.  Skin: Negative for pallor and rash.  Neurological: Negative for dizziness, speech difficulty, weakness and headaches.  Hematological: Negative for adenopathy. Does not bruise/bleed easily.  Psychiatric/Behavioral: Negative for confusion and sleep disturbance. The patient is not nervous/anxious.     PE; Blood pressure 105/64, pulse (!) 57, temperature 97.7 F (36.5 C), temperature source Oral, resp. rate 16, height 6' 0.5" (1.842 m), weight 226 lb 4 oz (102.6 kg), SpO2 97 %. Gen: Alert, well appearing.  Patient is oriented to person, place, time, and situation. AFFECT: pleasant, lucid thought and speech. ENT: Ears: EACs clear, normal epithelium.  TMs with good light reflex and landmarks bilaterally.  Eyes: no injection, icteris, swelling, or exudate.  EOMI, PERRLA. Nose: no drainage or turbinate edema/swelling.  No injection or focal lesion.  Mouth: lips without lesion/swelling.  Oral mucosa pink and moist.  Dentition intact and without obvious caries or gingival swelling.  Oropharynx without erythema, exudate, or swelling.  Neck: supple/nontender.  No LAD, mass, or TM.  Carotid pulses 2+ bilaterally, without bruits. CV: RRR, no m/r/g.   LUNGS: CTA bilat, nonlabored resps, good aeration in all lung fields. ABD: soft, NT, ND, BS normal.  No hepatospenomegaly or mass.  No bruits. EXT: no  clubbing or cyanosis.  He has 2+ pitting edema in both LL's. Musculoskeletal: no joint swelling, erythema, warmth, or tenderness.  ROM of all joints intact. Skin - no sores or suspicious  lesions or rashes or color changes Rectal exam: negative without mass, lesions or tenderness, PROSTATE EXAM: smooth and symmetric without nodules or tenderness.   Pertinent labs:  Lab Results  Component Value Date   INR 2.8 09/14/2018   INR 1.8 (A) 08/24/2018   INR 1.9 (A) 07/27/2018    Lab Results  Component Value Date   TSH 1.71 11/24/2016   Lab Results  Component Value Date   WBC 6.7 06/16/2017   HGB 13.9 06/16/2017   HCT 40.1 06/16/2017   MCV 80 06/16/2017   PLT 216 06/16/2017   Lab Results  Component Value Date   CREATININE 0.99 03/21/2018   BUN 15 03/21/2018   NA 144 03/21/2018   K 4.2 03/21/2018   CL 108 03/21/2018   CO2 30 03/21/2018   Lab Results  Component Value Date   ALT 23 03/21/2018   AST 26 03/21/2018   ALKPHOS 38 (L) 03/21/2018   BILITOT 0.6 03/21/2018   Lab Results  Component Value Date   CHOL 147 03/21/2018   Lab Results  Component Value Date   HDL 35.20 (L) 03/21/2018   Lab Results  Component Value Date   LDLCALC 93 03/21/2018   Lab Results  Component Value Date   TRIG 95.0 03/21/2018   Lab Results  Component Value Date   CHOLHDL 4 03/21/2018   Lab Results  Component Value Date   PSA 2.73 09/20/2017   PSA 2.50 03/10/2017   PSA 4.17 (H) 09/24/2016   Lab Results  Component Value Date   HGBA1C 5.6 03/22/2018    ASSESSMENT AND PLAN:   Health maintenance exam: Reviewed age and gender appropriate health maintenance issues (prudent diet, regular exercise, health risks of tobacco and excessive alcohol, use of seatbelts, fire alarms in home, use of sunscreen).  Also reviewed age and gender appropriate health screening as well as vaccine recommendations. Vaccines: flu vaccine--> given today.  Shingrix-->rx sent to pharmacy. Labs: CBC (chronic  anticoag), CMET (HF), FLP, PSA. Prostate ca screening: hx of elevated PSA-->plan per [facility] is annual DRE/PSA surveillance with me-->done today (DRE normal). Colon ca screening: next colonoscopy due 2023.  He has some fluid retention in LL's secondary to recent dietary (sodium) indiscretion: he only takes his lasix prn and says he has not taken any lately.  I advised him to go ahead and take a 20mg  dose today. He has resumed low sodium diet already.  An After Visit Summary was printed and given to the patient.  FOLLOW UP:  Return in about 6 months (around 03/22/2019) for routine chronic illness f/u.  Signed:  Crissie Sickles, MD           09/20/2018

## 2018-09-20 NOTE — Addendum Note (Signed)
Addended by: Tammi Sou on: 09/20/2018 09:04 AM   Modules accepted: Orders

## 2018-09-21 ENCOUNTER — Encounter: Payer: Self-pay | Admitting: Family Medicine

## 2018-09-21 ENCOUNTER — Ambulatory Visit: Payer: PPO | Admitting: Cardiology

## 2018-09-21 VITALS — BP 120/68 | HR 60 | Ht 72.0 in | Wt 227.2 lb

## 2018-09-21 DIAGNOSIS — Z7901 Long term (current) use of anticoagulants: Secondary | ICD-10-CM

## 2018-09-21 DIAGNOSIS — Z954 Presence of other heart-valve replacement: Secondary | ICD-10-CM | POA: Diagnosis not present

## 2018-09-21 DIAGNOSIS — I10 Essential (primary) hypertension: Secondary | ICD-10-CM | POA: Diagnosis not present

## 2018-09-21 DIAGNOSIS — I420 Dilated cardiomyopathy: Secondary | ICD-10-CM | POA: Diagnosis not present

## 2018-09-21 DIAGNOSIS — Z9889 Other specified postprocedural states: Secondary | ICD-10-CM | POA: Diagnosis not present

## 2018-09-21 MED ORDER — LOSARTAN POTASSIUM 100 MG PO TABS: 100.0000 mg | ORAL_TABLET | Freq: Every day | ORAL | 3 refills | Status: DC

## 2018-09-21 MED ORDER — FENOFIBRATE 145 MG PO TABS: 145.0000 mg | ORAL_TABLET | Freq: Every day | ORAL | 3 refills | Status: DC

## 2018-09-21 MED ORDER — METOPROLOL SUCCINATE ER 50 MG PO TB24: 50.0000 mg | ORAL_TABLET | Freq: Every day | ORAL | 3 refills | Status: DC

## 2018-09-21 NOTE — Patient Instructions (Signed)

## 2018-09-21 NOTE — Progress Notes (Signed)
Cardiology Office Note:    Date:  09/21/2018   ID:  Kenneth Ryan, DOB Oct 04, 1946, MRN [id number]  PCP:  Tammi Sou, MD  Cardiologist:  No primary care provider on file.  Electrophysiologist:  None   Referring MD: Tammi Sou, MD    History of Present Illness:    Kenneth Ryan is a 72 y.o. male here for follow-up aortic root replacement, Bentall procedure with On-X bileaflet mechanical valve, synthetic root conduit and mitral valve repair on 11/03/2016 for bicuspid aortic valve, severe aortic regurgitation, dilated aortic root, MVP with severe mitral regurgitation.  Office note from Dr. Roxy Manns reviewed from 11/15/2017, he was doing very well.  Reminder that he was asymptomatic prior to surgery.  Cardiac catheterization prior to surgery revealed no coronary artery disease.  Ejection fraction postoperatively was 35%, then improved to 45% on 06/08/2017.  Overall has been feeling quite well.  He tells me that yesterday he walked over a mile and a half in the heat and did well.  Unfortunately his grandchildren were in the bus that flipped over but they did fine, small scratches.  Denies any chest pain fevers chills nausea vomiting syncope bleeding orthopnea PND.  Past Medical History:  Diagnosis Date  . 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  . Basal cell carcinoma    nose  . Chronic hoarseness    secondary to hx of laryngeal surgery  . GERD (gastroesophageal reflux disease)   . Headache   . Heart murmur   . History of tobacco abuse   . Hypertriglyceridemia   . Increased prostate specific antigen (PSA) velocity 09/2016   saw Dr. Jeffie Pollock 11/18/16: plan is to repeat PSA 6 wks and if still same/up then plan TRUS+ bx.  PSA's declined on recheck x 2 and as of 03/10/17 was 2.5.  Recommended annual PSA's with me, f/u with urol prn.  . 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  Marland Kitchen Left ventricular dysfunction 2018   see echo results in Surgical hx section, 01/2017 and 05/2017.  Marland Kitchen Lesion of vocal cord   . Mitral regurgitation    severe by TEE 08/2016:  MV repair done 11/03/16  . Mitral valve prolapse    MV repair 10/2016  . 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  Per CV surg, as of 02/16/17, his INR goal is 1.5-2.5.  Marland Kitchen s/p Bentall procedure for aortic root aneurysm ([place]) 11/03/2016  . 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   Complex valvuloplasty including artificial Gore-tex neochord placement x6 and 28 mm Sorin Memo 3D ring annuloplasty    Past Surgical History:  Procedure Laterality Date  . BENTALL PROCEDURE N/A 11/03/2016   Procedure: BENTALL PROCEDURE AORTIC ROOT REPLACEMENT + mechanical AV;  Surgeon: Rexene Alberts, MD;  Location: [place];  Service: Open Heart Surgery;  Laterality: N/A;  . CARDIAC CATHETERIZATION N/A 09/29/2016   Clean coronaries, EF 60-65%.  Procedure: Right/Left Heart Cath and Coronary Angiography;  Surgeon: Jettie Booze, MD;  Location: [place] [facility];  Service: Cardiovascular;  Laterality: N/A;  . CHOLECYSTECTOMY  1980  . COLONOSCOPY  2003 and  2013   Both normal.  . DIRECT LARYNGOSCOPY Left 10/17/2015   Procedure: MICRO DIRECT LARYNGOSCOPY WITH FROZEN SECTION;  Surgeon: Jodi Marble, MD;  Location: [place];  Service: ENT;  Laterality: Left;  . MITRAL VALVE REPAIR N/A 11/03/2016   Procedure: MITRAL VALVE REPAIR (MVR);  Surgeon: Rexene Alberts, MD;  Location: [place];  Service: Open Heart Surgery;  Laterality: N/A;  . PERIPHERAL VASCULAR CATHETERIZATION N/A 09/29/2016   Procedure: Abdominal Aortogram;  Surgeon: Jettie Booze,  MD;  Location: [place] [facility];  Service: Cardiovascular;  Laterality: N/A;  . PERIPHERAL VASCULAR CATHETERIZATION N/A 09/29/2016   Procedure: Thoracic Aortogram;  Surgeon: Jettie Booze, MD;  Location: [place] [facility];  Service: Cardiovascular;  Laterality: N/A;  . TEE WITHOUT CARDIOVERSION N/A 09/17/2016   Procedure: TRANSESOPHAGEAL ECHOCARDIOGRAM (TEE);  Surgeon: Jerline Pain, MD;  Location: [place];  Service: Cardiovascular;  Laterality: N/A;  . TEE WITHOUT CARDIOVERSION N/A 11/03/2016   Procedure: TRANSESOPHAGEAL ECHOCARDIOGRAM (TEE);  Surgeon: Rexene Alberts, MD;  Location: [place];  Service: Open Heart Surgery;  Laterality: N/A;  . TONSILECTOMY/ADENOIDECTOMY WITH MYRINGOTOMY  1960  . 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  Mod/severe AR w/functional bicuspid AV.  EF 45%.  . TRANSTHORACIC ECHOCARDIOGRAM  07/2015   Bicuspid aortic valve + mod mitral regurg  . TRANSTHORACIC ECHOCARDIOGRAM  02/01/2017   (Post aortic root/valve surgery): EF 35%, diffuse hypokinesis, LV dilatation, grade I DD. Cardiologist added ARB and increased his BB dose.  Lasix prn added 06/2017 due to some LE edema.  . TRANSTHORACIC ECHOCARDIOGRAM  05/2017   LVEF 45-50%.  Grd II DD.  Mechanical AV good, MV repair good.  . Vocal cord biopsy  09/2015   No atypia or inflammation.  Some fibrous changes that are possibly from hx of radiation therapy.    Current Medications: Current Meds  Medication Sig  . acetaminophen (TYLENOL) 500 MG tablet Take 500-1,000 mg by mouth every 6 (six) hours as needed for mild pain (depends on pain level if takes 500 mg or 1000 mg).   Marland Kitchen aspirin EC 81 MG EC tablet Take 1 tablet (81 mg total) by mouth daily.  . fenofibrate (TRICOR) 145 MG tablet Take 1 tablet (145 mg total) by mouth daily.  . furosemide (LASIX) 20 MG tablet Take 1 tablet (20 mg total) by mouth daily as needed for fluid or edema (2 or more pounds).  . losartan (COZAAR) 100  MG tablet Take 1 tablet (100 mg total) by mouth daily.  . metoprolol succinate (TOPROL-XL) 50 MG 24 hr tablet Take 1 tablet (50 mg total) by mouth daily.  . Multiple Vitamins-Minerals (MULTIVITAMIN PO) Take 1 tablet by mouth daily.   . pantoprazole (PROTONIX) 40 MG tablet Take 1 tablet (40 mg total) by mouth daily.  Marland Kitchen warfarin (COUMADIN) 5 MG tablet Take as directed by Coumadin Clinic  . [DISCONTINUED] fenofibrate (TRICOR) 145 MG tablet Take 145 mg by mouth daily.  . [DISCONTINUED] losartan (COZAAR) 100 MG tablet Take 1 tablet (100 mg total) by mouth daily. Please keep upcoming appt in October for future refills. Thank you  . [DISCONTINUED] losartan (COZAAR) 100 MG tablet TAKE 1 TABLET BY MOUTH EVERY DAY  . [DISCONTINUED] metoprolol succinate (TOPROL-XL) 50 MG 24 hr tablet Take 1 tablet (50 mg total) by mouth daily. Please keep upcoming appt for future refills.     Allergies:   Patient has no known allergies.   Social History   Socioeconomic  History  . Marital status: Married    Spouse name: Not on file  . Number of children: Not on file  . Years of education: Not on file  . Highest education level: Not on file  Occupational History  . Not on file  Social Needs  . Financial resource strain: Not on file  . Food insecurity:    Worry: Not on file    Inability: Not on file  . Transportation needs:    Medical: Not on file    Non-medical: Not on file  Tobacco Use  . Smoking status: Former Smoker    Last attempt to quit: 07/22/1979    Years since quitting: 39.1  . Smokeless tobacco: Never Used  Substance and Sexual Activity  . Alcohol use: Yes    Comment: rare  . Drug use: No  . Sexual activity: Not on file  Lifestyle  . Physical activity:    Days per week: Not on file    Minutes per session: Not on file  . Stress: Not on file  Relationships  . Social connections:    Talks on phone: Not on file    Gets together: Not on file    Attends religious service: Not on file    Active  member of club or organization: Not on file    Attends meetings of clubs or organizations: Not on file    Relationship status: Not on file  Other Topics Concern  . Not on file  Social History Narrative   Married, one son deceased 03/20/2011.   Relocated from [place] to [facility] 06/2013 to be near grandchildren.   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   Smoker 20 pack-yr hx: quit about 1980.   Alcohol: 3 bottles of beer per week avg.     Drugs: none           Family History: The patient's family history includes Breast cancer in his mother; Diabetes in his sister; Heart disease in his father.  ROS:   Please see the history of present illness.     All other systems reviewed and are negative.  EKGs/Labs/Other Studies Reviewed:    The following studies were reviewed today:  ECHO 06/08/17:  - Left ventricle: The cavity size was normal. Systolic function was   mildly reduced. The estimated ejection fraction was in the range   of 45% to 50%. Features are consistent with a pseudonormal left   ventricular filling pattern, with concomitant abnormal relaxation   and increased filling pressure (grade 2 diastolic dysfunction). - Aortic valve: A mechanical prosthesis was present. - Mitral valve: Prior procedures included surgical repair.  EKG:  EKG is  ordered today.  The ekg ordered today demonstrates 09/21/2018-sinus rhythm 60 with no other abnormalities personally reviewed and interpreted.  Recent Labs: 09/20/2018: ALT 23; BUN 17; Creatinine, Ser 1.07; Hemoglobin 14.5; Platelets 212.0; Potassium 4.2; Sodium 143  Recent Lipid Panel    Component Value Date/Time   CHOL 161 09/20/2018 0838   TRIG 104.0 09/20/2018 0838   HDL 40.40 09/20/2018 0838   CHOLHDL 4 09/20/2018 0838   VLDL 20.8 09/20/2018 0838   LDLCALC 100 (H) 09/20/2018 0838    Physical Exam:    VS:  BP 120/68   Pulse 60   Ht 6' (1.829 m)   Wt 227 lb 3.2 oz (103.1 kg)   SpO2 98%   BMI 30.81 kg/m     Wt Readings from Last 3  Encounters:  09/21/18 227 lb 3.2 oz (103.1 kg)  09/20/18 226 lb 4 oz (102.6 kg)  03/21/18 227 lb 8 oz (103.2 kg)     GEN:  Well nourished, well developed in no acute distress HEENT: Normal NECK: No JVD; No carotid bruits LYMPHATICS: No lymphadenopathy CARDIAC: Sharp S2 click RRR, no murmurs, rubs, gallops RESPIRATORY:  Clear to auscultation without rales, wheezing or rhonchi  ABDOMEN: Soft, non-tender, non-distended MUSCULOSKELETAL:  No edema; No deformity, mild ventral hernia at inferior surgical site SKIN: Warm and dry NEUROLOGIC:  Alert and oriented x 3 PSYCHIATRIC:  Normal affect   ASSESSMENT:    1. Dilated cardiomyopathy ([place])   2. S/P aortic valve replacement with metallic valve   3. S/P mitral valve repair   4. Essential hypertension   5. Chronic anticoagulation    PLAN:    In order of problems listed above:  Bentall, mechanical aortic valve 2017  -Reviewed with patient.  Reviewed Dr. Guy Sandifer prior note.  Doing very well.  Dental prophylaxis.  Sharp S2 click noted on exam.  No changes made.  Try to avoid heavy excessive lifting with his mild ventral hernia noted at surgical site inferiorly.  We discussed today.  If this were to get worse, we would have him visit once again with Dr. Roxy Manns.  Mitral valve repair  -EF has dropped post repair which is to be expected.  Overall doing quite well.  No symptoms pre-or post repair.  Chronic anticoagulation  -INR 2-3 with mechanical AVR.  Doing very well.  Coumadin clinic here in our clinic has been exceptional. - Hemoglobin 13.9, creatinine 0.9  Essential hypertension  -Has seen Truitt Merle, NP in the past.  Increase losartan.  Doing very well.  Non ischemic cardiomyopathy, dilated cardiomyopathy  -On both beta-blocker, Toprol and angiotensin receptor blocker losartan.  . - Low-dose Lasix when necessary. He has taken sparingly.  EF seems improved 45 to 50%.  - He does have some symptoms of bendopnea but this is likely  secondary to restrictive lung physiology when bending over given his protuberant abdomen.   - He still has some diaphoresis which he feels as though this is slightly out of proportion to what he is expecting one walking on his treadmill.  Continue with exercise efforts.    Medication Adjustments/Labs and Tests Ordered: Current medicines are reviewed at length with the patient today.  Concerns regarding medicines are outlined above.  Orders Placed This Encounter  Procedures  . EKG 12-Lead   Meds ordered this encounter  Medications  . fenofibrate (TRICOR) 145 MG tablet    Sig: Take 1 tablet (145 mg total) by mouth daily.    Dispense:  90 tablet    Refill:  3  . losartan (COZAAR) 100 MG tablet    Sig: Take 1 tablet (100 mg total) by mouth daily.    Dispense:  90 tablet    Refill:  3  . metoprolol succinate (TOPROL-XL) 50 MG 24 hr tablet    Sig: Take 1 tablet (50 mg total) by mouth daily.    Dispense:  90 tablet    Refill:  3    Patient Instructions  Medication Instructions:  The current medical regimen is effective;  continue present plan and medications.  Follow-Up: Follow up in 1 year with Dr. [name].  You will receive a letter in the mail 2 months before you are due.  Please call us when you receive this letter to schedule your follow up appointment.  If you need a refill on your cardiac medications before your  next appointment, please call your pharmacy.  Thank you for choosing [facility]!!        Signed, Candee Furbish, MD  09/21/2018 10:17 AM    [place]

## 2018-09-22 ENCOUNTER — Other Ambulatory Visit: Payer: Self-pay | Admitting: Family Medicine

## 2018-09-22 DIAGNOSIS — R972 Elevated prostate specific antigen [PSA]: Secondary | ICD-10-CM

## 2018-09-28 ENCOUNTER — Ambulatory Visit: Payer: PPO | Admitting: *Deleted

## 2018-09-28 DIAGNOSIS — I7121 Aneurysm of the ascending aorta, without rupture: Secondary | ICD-10-CM

## 2018-09-28 DIAGNOSIS — I719 Aortic aneurysm of unspecified site, without rupture: Secondary | ICD-10-CM | POA: Diagnosis not present

## 2018-09-28 DIAGNOSIS — Z954 Presence of other heart-valve replacement: Secondary | ICD-10-CM | POA: Diagnosis not present

## 2018-09-28 DIAGNOSIS — Z5181 Encounter for therapeutic drug level monitoring: Secondary | ICD-10-CM

## 2018-09-28 DIAGNOSIS — Z9889 Other specified postprocedural states: Secondary | ICD-10-CM | POA: Diagnosis not present

## 2018-09-28 LAB — POCT INR: INR: 2.8 (ref 2.0–3.0)

## 2018-09-28 NOTE — Patient Instructions (Signed)
Description   Start taking 1/2 tablet daily except on Tuesdays and Saturdays take 1 tablet.  Recheck INR in 2 weeks. Call Coumadin clinic with any questions [phone]. Increase your green leafy vegetable from 2 servings each week to 3 servings.

## 2018-10-12 ENCOUNTER — Ambulatory Visit: Payer: PPO | Admitting: *Deleted

## 2018-10-12 DIAGNOSIS — I7121 Aneurysm of the ascending aorta, without rupture: Secondary | ICD-10-CM

## 2018-10-12 DIAGNOSIS — Z954 Presence of other heart-valve replacement: Secondary | ICD-10-CM

## 2018-10-12 DIAGNOSIS — Z9889 Other specified postprocedural states: Secondary | ICD-10-CM | POA: Diagnosis not present

## 2018-10-12 DIAGNOSIS — I719 Aortic aneurysm of unspecified site, without rupture: Secondary | ICD-10-CM

## 2018-10-12 DIAGNOSIS — Z5181 Encounter for therapeutic drug level monitoring: Secondary | ICD-10-CM

## 2018-10-12 LAB — POCT INR: INR: 2 (ref 2.0–3.0)

## 2018-10-12 NOTE — Patient Instructions (Signed)
Description   Continue taking 1/2 tablet daily except on Tuesdays and Saturdays take 1 tablet.  Recheck INR in 3 weeks. Call Coumadin clinic with any questions [phone].Continue eating green leafy vegetable from 2 servings each week to 3 servings.

## 2018-10-16 ENCOUNTER — Other Ambulatory Visit: Payer: Self-pay | Admitting: Family Medicine

## 2018-10-20 IMAGING — CR DG CHEST 2V
2 series · 2 of 2 positions shown · non-contrast
Comparison: Radiographs February 18, 2015.

CLINICAL DATA: Shortness of breath, cough.

EXAM:
CHEST  2 VIEW

[w chest pa]
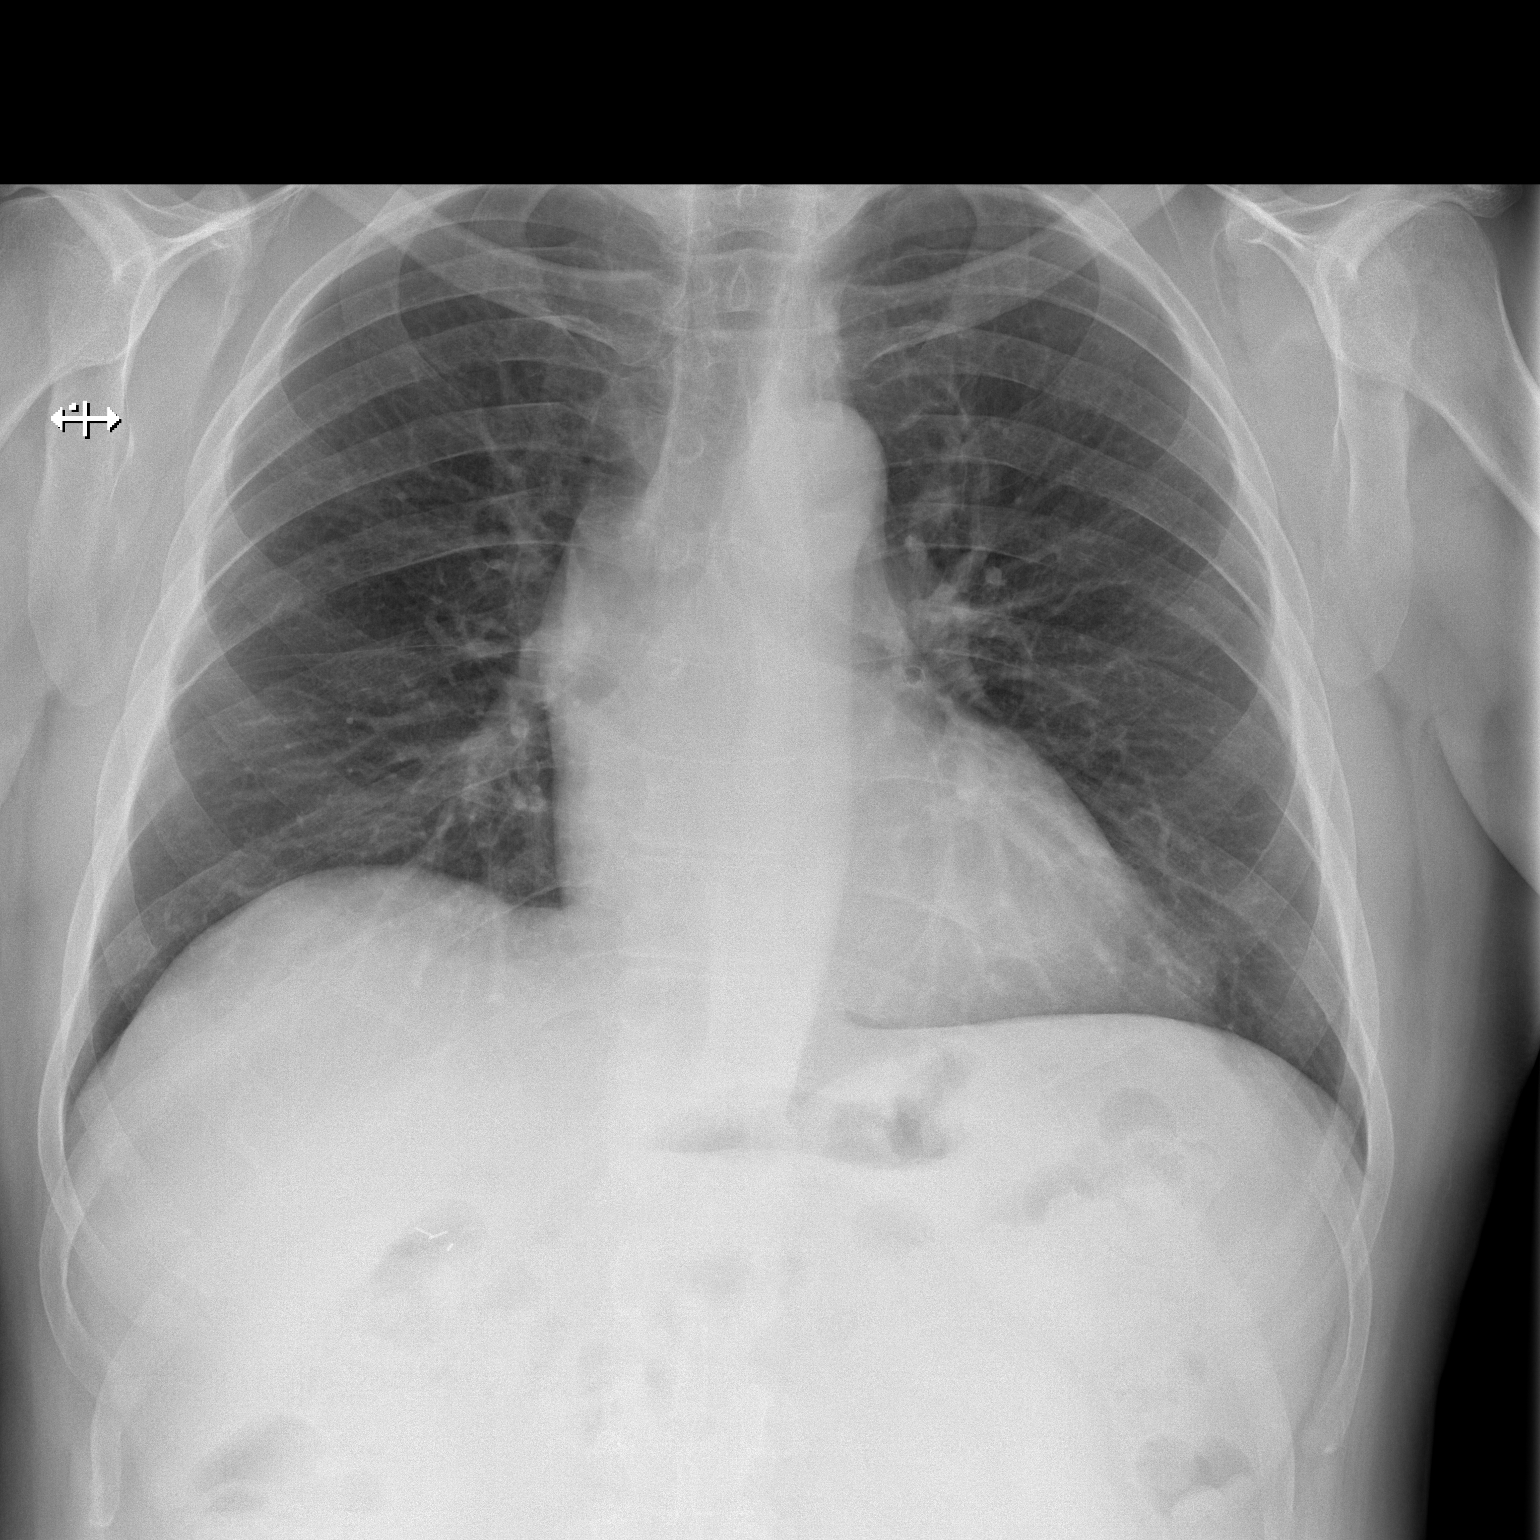

[w chest lat]
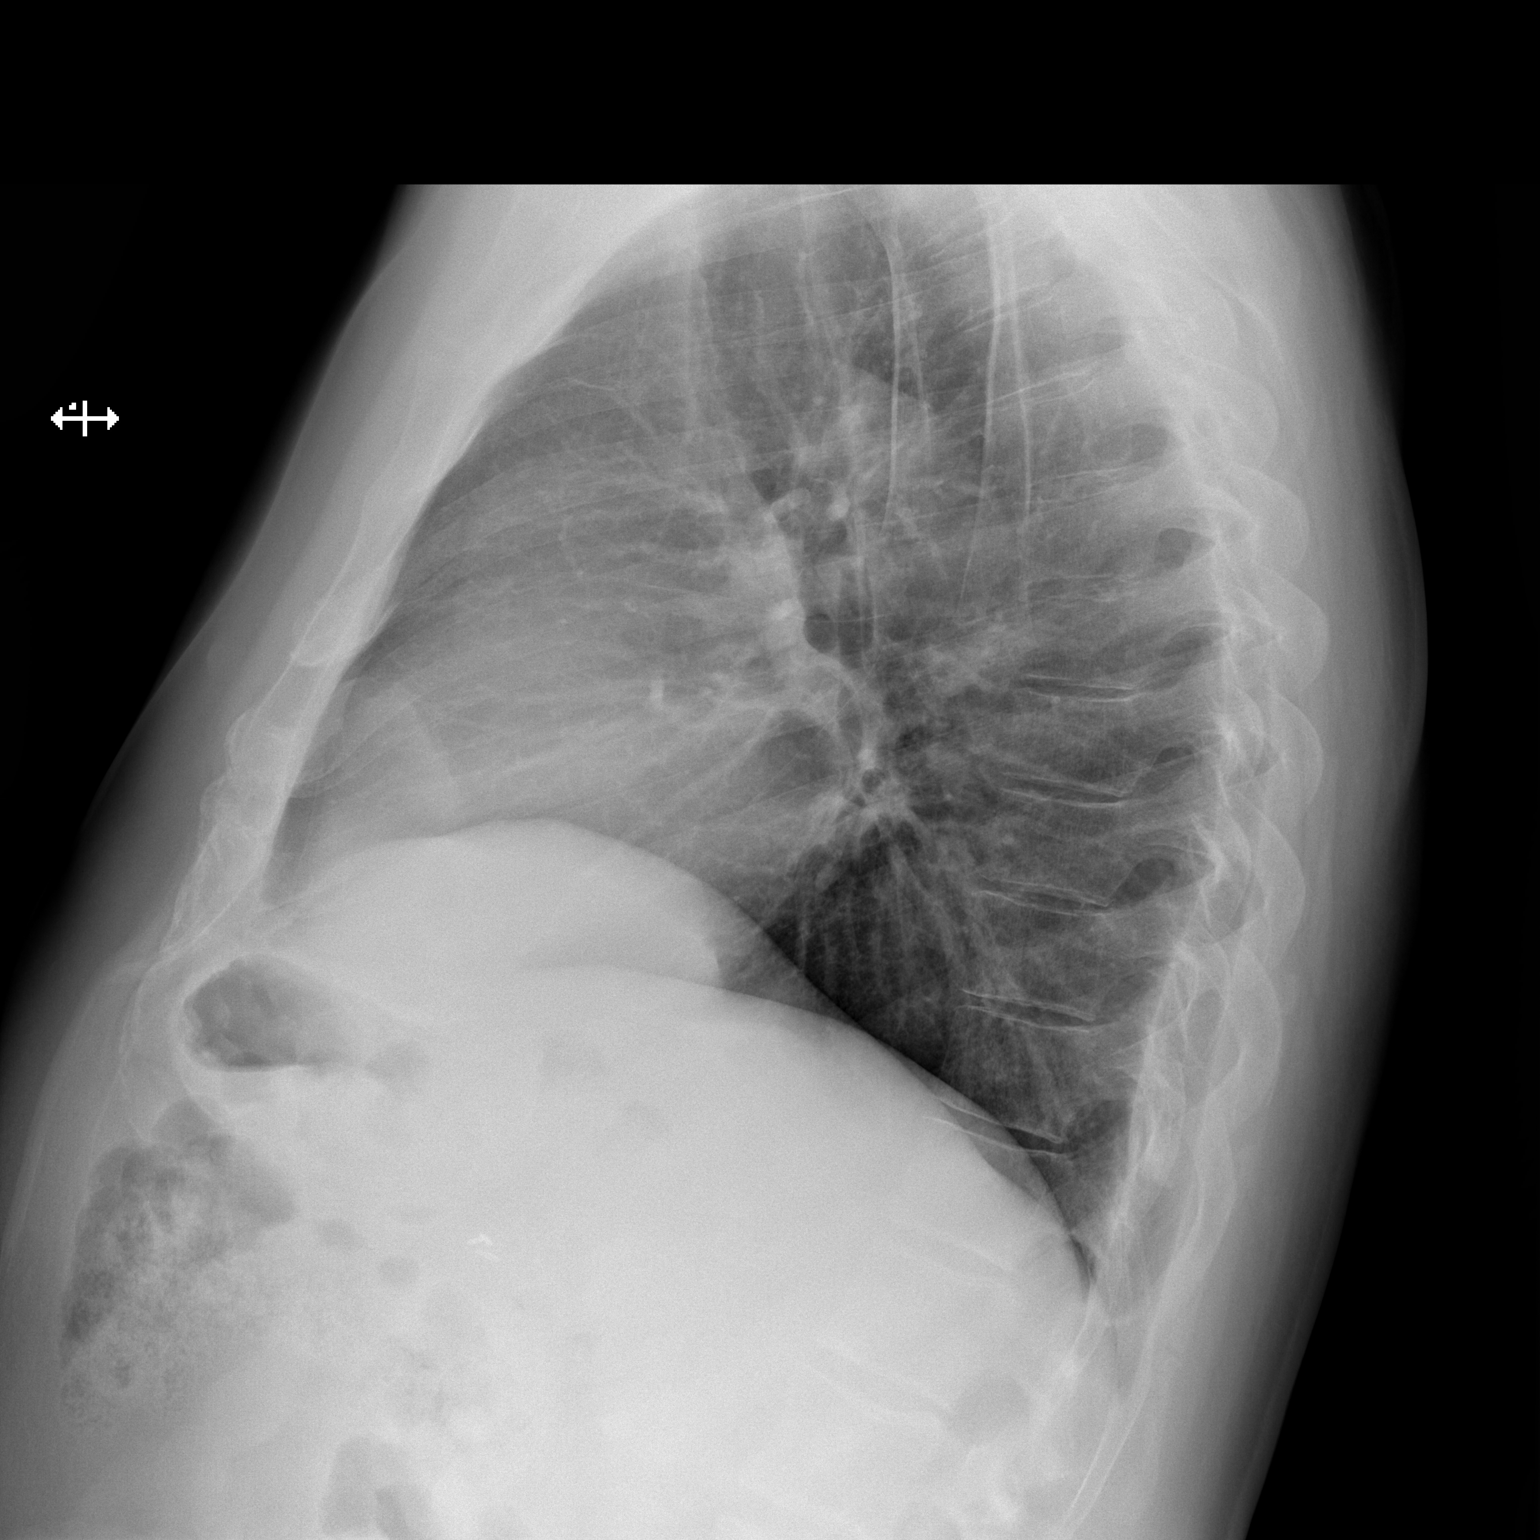

[2 of 2 positions shown; findings below may reference images not displayed]

FINDINGS: The heart size and mediastinal contours are within normal limits.
Both lungs are clear. No pneumothorax or pleural effusion is noted.
The visualized skeletal structures are unremarkable.
IMPRESSION: No active cardiopulmonary disease.

## 2018-10-24 IMAGING — DX DG CHEST 1V PORT
1 series · 1 of 1 positions shown · non-contrast
Comparison: 10/30/2016

CLINICAL DATA: Atelectasis.

EXAM:
PORTABLE CHEST 1 VIEW

[chest ap]
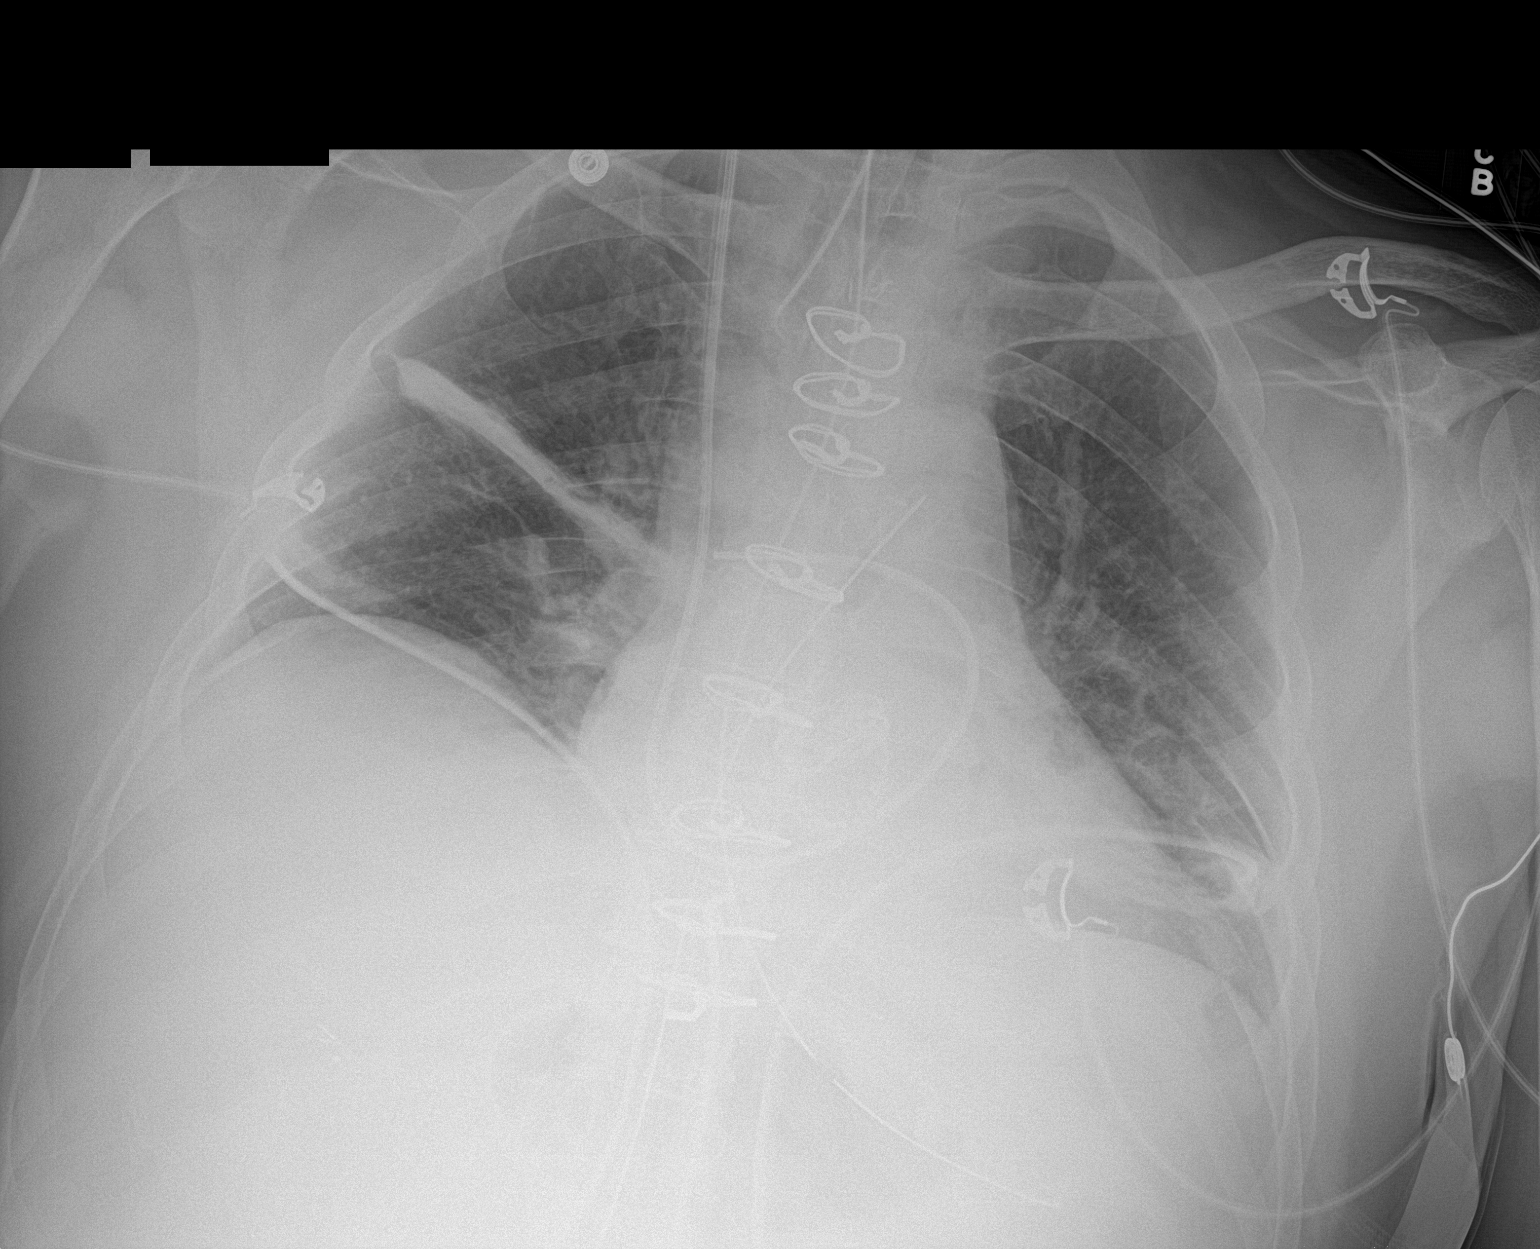

[1 of 1 positions shown; findings below may reference images not displayed]

FINDINGS: Postop median sternotomy. Endotracheal tube in good position.
Mediastinal drain in place. Bilateral chest tubes in place. No
pneumothorax

Bibasilar atelectasis with decreased lung volume. No effusion or
edema.

NG tube in the stomach. Swan-Ganz catheter in the right pulmonary
artery.
IMPRESSION: Support lines in good position.  No pneumothorax

Hypoventilation with bibasilar atelectasis.

## 2018-10-27 IMAGING — DX DG CHEST 2V
2 series · 2 of 2 positions shown · non-contrast
Comparison: Portable chest x-ray November 05, 2016

CLINICAL DATA: As post mitral valve rib pair and Bentall procedure
on November 03, 2016 ; follow-up atelectasis.

EXAM:
CHEST  2 VIEW

[chest pa]
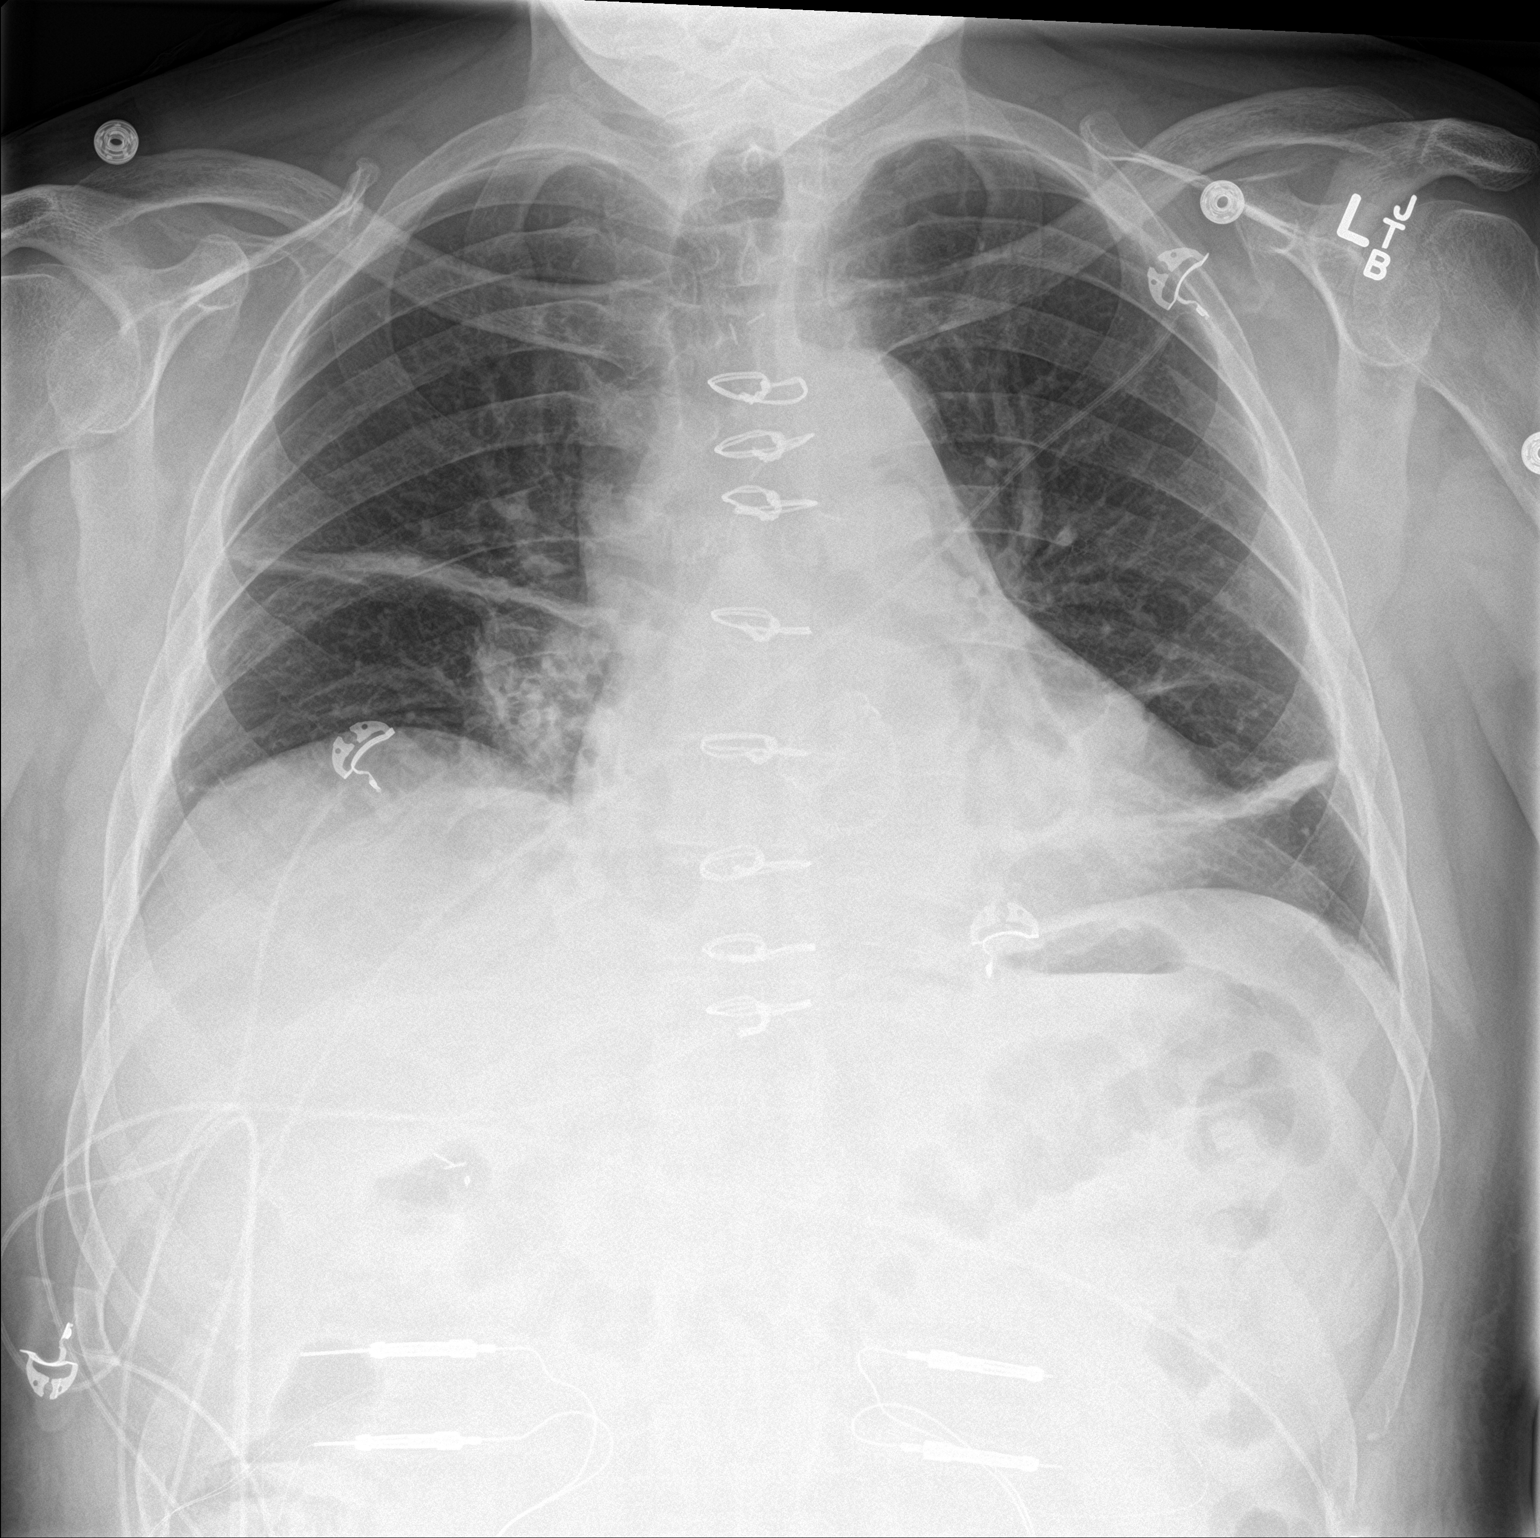

[chest lat]
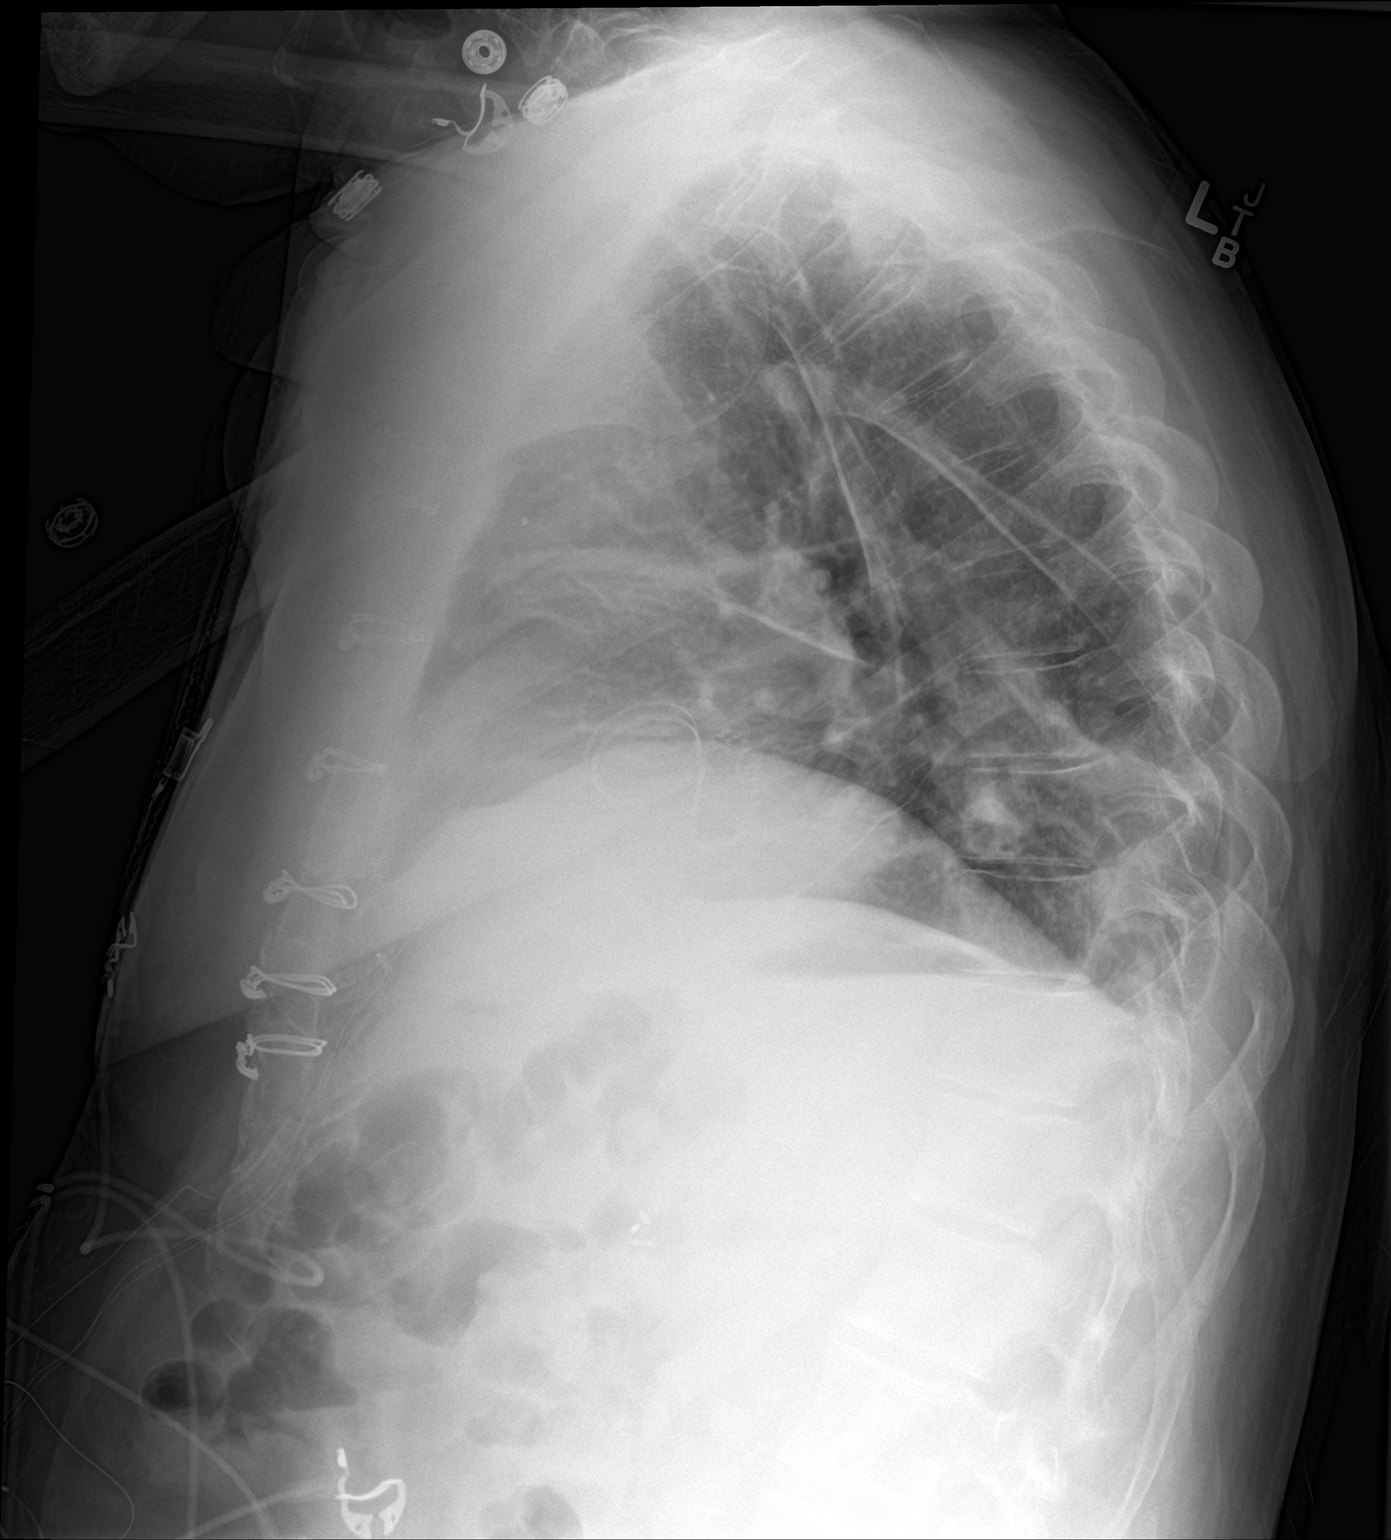

[2 of 2 positions shown; findings below may reference images not displayed]

FINDINGS: The lungs are better inflated today. Persistent platelike
atelectasis is present in the mid and lower lungs bilaterally. The
chest tubes have been removed. There is no definite pneumothorax.
There is no significant pleural effusion. The right perihilar soft
tissues remain prominent but are less conspicuous today. The right
internal jugular Cordis sheath has been removed. The cardiac
silhouette is mildly enlarged. The pulmonary vascularity is less
prominent today. Prosthetic aortic and mitral valve rings are
visible. The sternal wires are intact.
IMPRESSION: Improved appearance of both lungs with persistent atelectasis in the
right middle lobe and left lower lobe. No significant pleural
effusion, no pneumothorax. No CHF.

## 2018-11-02 ENCOUNTER — Ambulatory Visit: Payer: PPO | Admitting: *Deleted

## 2018-11-02 DIAGNOSIS — Z9889 Other specified postprocedural states: Secondary | ICD-10-CM | POA: Diagnosis not present

## 2018-11-02 DIAGNOSIS — I719 Aortic aneurysm of unspecified site, without rupture: Secondary | ICD-10-CM | POA: Diagnosis not present

## 2018-11-02 DIAGNOSIS — I7121 Aneurysm of the ascending aorta, without rupture: Secondary | ICD-10-CM

## 2018-11-02 DIAGNOSIS — Z5181 Encounter for therapeutic drug level monitoring: Secondary | ICD-10-CM

## 2018-11-02 DIAGNOSIS — Z954 Presence of other heart-valve replacement: Secondary | ICD-10-CM

## 2018-11-02 LAB — POCT INR: INR: 2.2 (ref 2.0–3.0)

## 2018-11-02 NOTE — Patient Instructions (Signed)
Description   Continue taking 1/2 tablet daily except on Tuesdays and Saturdays take 1 tablet.  Recheck INR in 4 weeks. Call Coumadin clinic with any questions [phone].Continue eating green leafy vegetable from 2 servings each week to 3 servings.

## 2018-11-21 ENCOUNTER — Other Ambulatory Visit: Payer: PPO

## 2018-11-30 ENCOUNTER — Ambulatory Visit: Payer: PPO | Admitting: *Deleted

## 2018-11-30 DIAGNOSIS — Z5181 Encounter for therapeutic drug level monitoring: Secondary | ICD-10-CM | POA: Diagnosis not present

## 2018-11-30 DIAGNOSIS — Z954 Presence of other heart-valve replacement: Secondary | ICD-10-CM

## 2018-11-30 DIAGNOSIS — Z9889 Other specified postprocedural states: Secondary | ICD-10-CM | POA: Diagnosis not present

## 2018-11-30 DIAGNOSIS — I719 Aortic aneurysm of unspecified site, without rupture: Secondary | ICD-10-CM | POA: Diagnosis not present

## 2018-11-30 DIAGNOSIS — I7121 Aneurysm of the ascending aorta, without rupture: Secondary | ICD-10-CM

## 2018-11-30 LAB — POCT INR: INR: 2 (ref 2.0–3.0)

## 2018-11-30 NOTE — Patient Instructions (Signed)
Description   Continue taking 1/2 tablet daily except on Tuesdays and Saturdays take 1 tablet.  Recheck INR in 6 weeks. Call Coumadin clinic with any questions [phone].Continue eating green leafy vegetable from 2 servings each week to 3 servings.

## 2018-12-07 ENCOUNTER — Encounter: Payer: Self-pay | Admitting: Family Medicine

## 2018-12-07 ENCOUNTER — Other Ambulatory Visit (INDEPENDENT_AMBULATORY_CARE_PROVIDER_SITE_OTHER): Payer: PPO

## 2018-12-07 DIAGNOSIS — R972 Elevated prostate specific antigen [PSA]: Secondary | ICD-10-CM

## 2018-12-07 LAB — PSA, MEDICARE: PSA: 3.94 ng/mL (ref 0.10–4.00)

## 2018-12-08 ENCOUNTER — Encounter: Payer: Self-pay | Admitting: *Deleted

## 2019-01-05 DIAGNOSIS — R972 Elevated prostate specific antigen [PSA]: Secondary | ICD-10-CM | POA: Diagnosis not present

## 2019-01-05 DIAGNOSIS — N400 Enlarged prostate without lower urinary tract symptoms: Secondary | ICD-10-CM | POA: Diagnosis not present

## 2019-01-11 ENCOUNTER — Ambulatory Visit: Payer: PPO | Admitting: *Deleted

## 2019-01-11 DIAGNOSIS — Z954 Presence of other heart-valve replacement: Secondary | ICD-10-CM

## 2019-01-11 DIAGNOSIS — Z5181 Encounter for therapeutic drug level monitoring: Secondary | ICD-10-CM | POA: Diagnosis not present

## 2019-01-11 DIAGNOSIS — I7121 Aneurysm of the ascending aorta, without rupture: Secondary | ICD-10-CM

## 2019-01-11 DIAGNOSIS — I719 Aortic aneurysm of unspecified site, without rupture: Secondary | ICD-10-CM

## 2019-01-11 DIAGNOSIS — Z9889 Other specified postprocedural states: Secondary | ICD-10-CM

## 2019-01-11 LAB — POCT INR: INR: 1.8 — AB (ref 2.0–3.0)

## 2019-01-11 NOTE — Patient Instructions (Signed)
Description   Today take 1 tablet then continue taking 1/2 tablet daily except on Tuesdays and Saturdays take 1 tablet.  Recheck INR in 5 weeks. Call Coumadin clinic with any questions [phone].Continue eating green leafy vegetable from 2 servings each week to 3 servings.

## 2019-02-15 ENCOUNTER — Ambulatory Visit (INDEPENDENT_AMBULATORY_CARE_PROVIDER_SITE_OTHER): Payer: PPO | Admitting: *Deleted

## 2019-02-15 DIAGNOSIS — Z9889 Other specified postprocedural states: Secondary | ICD-10-CM | POA: Diagnosis not present

## 2019-02-15 DIAGNOSIS — Z5181 Encounter for therapeutic drug level monitoring: Secondary | ICD-10-CM | POA: Diagnosis not present

## 2019-02-15 DIAGNOSIS — I719 Aortic aneurysm of unspecified site, without rupture: Secondary | ICD-10-CM | POA: Diagnosis not present

## 2019-02-15 DIAGNOSIS — Z954 Presence of other heart-valve replacement: Secondary | ICD-10-CM

## 2019-02-15 DIAGNOSIS — I7121 Aneurysm of the ascending aorta, without rupture: Secondary | ICD-10-CM

## 2019-02-15 LAB — POCT INR: INR: 1.6 — AB (ref 2.0–3.0)

## 2019-02-15 NOTE — Patient Instructions (Signed)
Description   Start taking 1/2 tablet daily except on 1 tablet on Tuesdays, Thursdays,  and Saturdays.  Recheck INR in 3 weeks. Call Coumadin clinic with any questions [phone].Continue eating green leafy vegetable from 2 servings each week to 3 servings.

## 2019-02-27 ENCOUNTER — Other Ambulatory Visit: Payer: Self-pay

## 2019-02-27 ENCOUNTER — Ambulatory Visit (INDEPENDENT_AMBULATORY_CARE_PROVIDER_SITE_OTHER): Payer: PPO

## 2019-02-27 VITALS — BP 128/64 | HR 57 | Ht 72.0 in | Wt 232.1 lb

## 2019-02-27 DIAGNOSIS — Z23 Encounter for immunization: Secondary | ICD-10-CM

## 2019-02-27 DIAGNOSIS — E669 Obesity, unspecified: Secondary | ICD-10-CM

## 2019-02-27 DIAGNOSIS — Z0000 Encounter for general adult medical examination without abnormal findings: Secondary | ICD-10-CM | POA: Diagnosis not present

## 2019-02-27 MED ORDER — ZOSTER VAC RECOMB ADJUVANTED 50 MCG/0.5ML IM SUSR: 0.5000 mL | Freq: Once | INTRAMUSCULAR | 1 refills | Status: AC

## 2019-02-27 NOTE — Patient Instructions (Addendum)
Shingles vaccine at pharmacy.   Bring a copy of your living will and/or healthcare power of attorney to your next office visit.  Continue doing brain stimulating activities (puzzles, reading, adult coloring books, staying active) to keep memory sharp.  Health Maintenance, Male A healthy lifestyle and preventive care is important for your health and wellness. Ask your health care provider about what schedule of regular examinations is right for you. What should I know about weight and diet? Eat a Healthy Diet  Eat plenty of vegetables, fruits, whole grains, low-fat dairy products, and lean protein.  Do not eat a lot of foods high in solid fats, added sugars, or salt.  Maintain a Healthy Weight Regular exercise can help you achieve or maintain a healthy weight. You should:  Do at least 150 minutes of exercise each week. The exercise should increase your heart rate and make you sweat (moderate-intensity exercise).  Do strength-training exercises at least twice a week. Watch Your Levels of Cholesterol and Blood Lipids  Have your blood tested for lipids and cholesterol every 5 years starting at 71 years of age. If you are at high risk for heart disease, you should start having your blood tested when you are 71 years old. You may need to have your cholesterol levels checked more often if: ? Your lipid or cholesterol levels are high. ? You are older than 71 years of age. ? You are at high risk for heart disease. What should I know about cancer screening? Many types of cancers can be detected early and may often be prevented. Lung Cancer  You should be screened every year for lung cancer if: ? You are a current smoker who has smoked for at least 30 years. ? You are a former smoker who has quit within the past 15 years.  Talk to your health care provider about your screening options, when you should start screening, and how often you should be screened. Colorectal Cancer  Routine  colorectal cancer screening usually begins at 71 years of age and should be repeated every 5-10 years until you are 71 years old. You may need to be screened more often if early forms of precancerous polyps or small growths are found. Your health care provider may recommend screening at an earlier age if you have risk factors for colon cancer.  Your health care provider may recommend using home test kits to check for hidden blood in the stool.  A small camera at the end of a tube can be used to examine your colon (sigmoidoscopy or colonoscopy). This checks for the earliest forms of colorectal cancer. Prostate and Testicular Cancer  Depending on your age and overall health, your health care provider may do certain tests to screen for prostate and testicular cancer.  Talk to your health care provider about any symptoms or concerns you have about testicular or prostate cancer. Skin Cancer  Check your skin from head to toe regularly.  Tell your health care provider about any new moles or changes in moles, especially if: ? There is a change in a mole's size, shape, or color. ? You have a mole that is larger than a pencil eraser.  Always use sunscreen. Apply sunscreen liberally and repeat throughout the day.  Protect yourself by wearing long sleeves, pants, a wide-brimmed hat, and sunglasses when outside. What should I know about heart disease, diabetes, and high blood pressure?  If you are 44-8 years of age, have your blood pressure checked every 3-5 years.  If you are 56 years of age or older, have your blood pressure checked every year. You should have your blood pressure measured twice-once when you are at a hospital or clinic, and once when you are not at a hospital or clinic. Record the average of the two measurements. To check your blood pressure when you are not at a hospital or clinic, you can use: ? An automated blood pressure machine at a pharmacy. ? A home blood pressure  monitor.  Talk to your health care provider about your target blood pressure.  If you are between 29-69 years old, ask your health care provider if you should take aspirin to prevent heart disease.  Have regular diabetes screenings by checking your fasting blood sugar level. ? If you are at a normal weight and have a low risk for diabetes, have this test once every three years after the age of 41. ? If you are overweight and have a high risk for diabetes, consider being tested at a younger age or more often.  A one-time screening for abdominal aortic aneurysm (AAA) by ultrasound is recommended for men aged 72-75 years who are current or former smokers. What should I know about preventing infection? Hepatitis B If you have a higher risk for hepatitis B, you should be screened for this virus. Talk with your health care provider to find out if you are at risk for hepatitis B infection. Hepatitis C Blood testing is recommended for:  Everyone born from [birth year] through 1963.  Anyone with known risk factors for hepatitis C. Sexually Transmitted Diseases (STDs)  You should be screened each year for STDs including gonorrhea and chlamydia if: ? You are sexually active and are younger than 71 years of age. ? You are older than 71 years of age and your health care provider tells you that you are at risk for this type of infection. ? Your sexual activity has changed since you were last screened and you are at an increased risk for chlamydia or gonorrhea. Ask your health care provider if you are at risk.  Talk with your health care provider about whether you are at high risk of being infected with HIV. Your health care provider may recommend a prescription medicine to help prevent HIV infection. What else can I do?  Schedule regular health, dental, and eye exams.  Stay current with your vaccines (immunizations).  Do not use any tobacco products, such as cigarettes, chewing tobacco, and e-cigarettes.  If you need help quitting, ask your health care provider.  Limit alcohol intake to no more than 2 drinks per day. One drink equals 12 ounces of beer, 5 ounces of wine, or 1 ounces of hard liquor.  Do not use street drugs.  Do not share needles.  Ask your health care provider for help if you need support or information about quitting drugs.  Tell your health care provider if you often feel depressed.  Tell your health care provider if you have ever been abused or do not feel safe at home. This information is not intended to replace advice given to you by your health care provider. Make sure you discuss any questions you have with your health care provider. Document Released: 06/04/2008 Document Revised: 08/05/2016 Document Reviewed: 09/10/2015 Elsevier Interactive Patient Education  2019 [employer].

## 2019-02-27 NOTE — Progress Notes (Signed)
Subjective:   Kenneth Ryan is a 73 y.o. male who presents for Medicare Annual/Subsequent preventive examination.  Review of Systems:  No ROS.  Medicare Wellness Visit. Additional risk factors are reflected in the social history.  Cardiac Risk Factors include: advanced age (>2men, >60 women);dyslipidemia;male gender;obesity (BMI >30kg/m2);hypertension;family history of premature cardiovascular disease   Sleep patterns: Sleeps 8-10 hours.  Home Safety/Smoke Alarms: Feels safe in home. Smoke alarms in place.  Living environment; residence and Firearm Safety: Lives alone in 1 story home. 3 steps at door, no issues Seat Belt Safety/Bike Helmet: Wears seat belt.    Male:   CCS-Colonoscopy 01/01/2013Pt reports normal, recall 10 years         PSA-Followed by Urology. F/U 03/2019 Lab Results  Component Value Date   PSA 3.94 12/07/2018   PSA 3.32 09/20/2018   PSA 2.73 09/20/2017       Objective:    Vitals: BP 128/64 (BP Location: Left Arm, Patient Position: Sitting, Cuff Size: Normal)   Pulse (!) 57   Ht 6' (1.829 m)   Wt 232 lb 2 oz (105.3 kg)   SpO2 97%   BMI 31.48 kg/m   Body mass index is 31.48 kg/m.  Advanced Directives 02/27/2019 02/21/2018 02/15/2017 12/23/2016 11/24/2016 11/09/2016 10/30/2016  Does Patient Have a Medical Advance Directive? No Yes Yes - Yes Yes Yes  Type of Advance Directive - [place];Living will Living will;Healthcare Power of [place];Living will [place];Living will  Does patient want to make changes to medical advance directive? - - - Yes (Inpatient - patient defers changing a medical advance directive at this time) No - Patient declined No - Patient declined No - Patient declined  Copy of [place] in Chart? - No - copy requested No - copy requested - No - copy requested No - copy requested No - copy requested  Would patient like  information on creating a medical advance directive? No - Patient declined - - - - - -    Tobacco Social History   Tobacco Use  Smoking Status Former Smoker  . Last attempt to quit: 07/22/1979  . Years since quitting: 39.6  Smokeless Tobacco Never Used     Counseling given: Not Answered    Past Medical History:  Diagnosis Date  . 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  . Basal cell carcinoma    nose  . Chronic hoarseness    secondary to hx of laryngeal surgery  . GERD (gastroesophageal reflux disease)   . Headache   . Heart murmur   . History of tobacco abuse   . Hypertriglyceridemia   . Increased prostate specific antigen (PSA) velocity 09/2016   saw Dr. Jeffie Pollock 11/18/16: plan is to repeat PSA 6 wks and if still same/up then plan TRUS+ bx.  PSA's declined on recheck x 2 and as of 03/10/17 was 2.5.  Recommended annual PSA's with me, f/u with urol prn.  PSA velocity inc 09/2018, repeat PSA 12/07/18 PSA trending up again--recommended pt return to Dr. Jeffie Pollock.  . 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  Marland Kitchen Left ventricular dysfunction 2018   see echo results in Surgical hx section, 01/2017 and 05/2017.  Marland Kitchen  Lesion of vocal cord   . Mitral regurgitation    severe by TEE 08/2016:  MV repair done 11/03/16  . Mitral valve prolapse    MV repair 10/2016  . 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  Per CV surg, as of 02/16/17, his INR goal is 1.5-2.5.  Marland Kitchen s/p Bentall procedure for aortic root aneurysm ([place]) 11/03/2016  . 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   Complex valvuloplasty including  artificial Gore-tex neochord placement x6 and 28 mm Sorin Memo 3D ring annuloplasty   Past Surgical History:  Procedure Laterality Date  . BENTALL PROCEDURE N/A 11/03/2016   Procedure: BENTALL PROCEDURE AORTIC ROOT REPLACEMENT + mechanical AV;  Surgeon: Rexene Alberts, MD;  Location: [place];  Service: Open Heart Surgery;  Laterality: N/A;  . CARDIAC CATHETERIZATION N/A 09/29/2016   Clean coronaries, EF 60-65%.  Procedure: Right/Left Heart Cath and Coronary Angiography;  Surgeon: Jettie Booze, MD;  Location: [place] CV LAB;  Service: Cardiovascular;  Laterality: N/A;  . CHOLECYSTECTOMY  1980  . COLONOSCOPY  2003 and 2013   Both normal.  . DIRECT LARYNGOSCOPY Left 10/17/2015   Procedure: MICRO DIRECT LARYNGOSCOPY WITH FROZEN SECTION;  Surgeon: Jodi Marble, MD;  Location: [place];  Service: ENT;  Laterality: Left;  . MITRAL VALVE REPAIR N/A 11/03/2016   Procedure: MITRAL VALVE REPAIR (MVR);  Surgeon: Rexene Alberts, MD;  Location: [place];  Service: Open Heart Surgery;  Laterality: N/A;  . PERIPHERAL VASCULAR CATHETERIZATION N/A 09/29/2016   Procedure: Abdominal Aortogram;  Surgeon: Jettie Booze, MD;  Location: [place] CV LAB;  Service: Cardiovascular;  Laterality: N/A;  . PERIPHERAL VASCULAR CATHETERIZATION N/A 09/29/2016   Procedure: Thoracic Aortogram;  Surgeon: Jettie Booze, MD;  Location: [place] CV LAB;  Service: Cardiovascular;  Laterality: N/A;  . TEE WITHOUT CARDIOVERSION N/A 09/17/2016   Procedure: TRANSESOPHAGEAL ECHOCARDIOGRAM (TEE);  Surgeon: Jerline Pain, MD;  Location: [place];  Service: Cardiovascular;  Laterality: N/A;  . TEE WITHOUT CARDIOVERSION N/A 11/03/2016   Procedure: TRANSESOPHAGEAL ECHOCARDIOGRAM (TEE);  Surgeon: Rexene Alberts, MD;  Location: [place];  Service: Open Heart Surgery;  Laterality: N/A;  . TONSILECTOMY/ADENOIDECTOMY WITH MYRINGOTOMY  1960  . 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   Mod/severe AR w/functional bicuspid AV.  EF 45%.  . TRANSTHORACIC ECHOCARDIOGRAM  07/2015   Bicuspid aortic valve + mod mitral regurg  . TRANSTHORACIC ECHOCARDIOGRAM  02/01/2017   (Post aortic root/valve surgery): EF 35%, diffuse hypokinesis, LV dilatation, grade I DD. Cardiologist added ARB and increased his BB dose.  Lasix prn added 06/2017 due to some LE edema.  . TRANSTHORACIC ECHOCARDIOGRAM  05/2017   LVEF 45-50%.  Grd II DD.  Mechanical AV good, MV repair good.  . Vocal cord biopsy  09/2015   No atypia or inflammation.  Some fibrous changes that are possibly from hx of radiation therapy.   Family History  Problem Relation Age of Onset  . Heart disease Father   . Breast cancer Mother   . Breast cancer Sister   . Diabetes Brother   . Breast cancer Sister    Social History   Socioeconomic History  . Marital status: Widowed    Spouse name: Not on file  . Number of children: Not on file  . Years of education:  Not on file  . Highest education level: Not on file  Occupational History  . Not on file  Social Needs  . Financial resource strain: Not on file  . Food insecurity:    Worry: Not on file    Inability: Not on file  . Transportation needs:    Medical: Not on file    Non-medical: Not on file  Tobacco Use  . Smoking status: Former Smoker    Last attempt to quit: 07/22/1979    Years since quitting: 39.6  . Smokeless tobacco: Never Used  Substance and Sexual Activity  . Alcohol use: Yes    Comment: rare  . Drug use: No  . Sexual activity: Not on file  Lifestyle  . Physical activity:    Days per week: Not on file    Minutes per session: Not on file  . Stress: Not on file  Relationships  . Social connections:    Talks on phone: Not on file    Gets together: Not on file    Attends religious service: Not on file    Active member of club or organization: Not on file    Attends meetings of clubs or organizations: Not on file    Relationship status: Not on file  Other  Topics Concern  . Not on file  Social History Narrative   Married, one son deceased 04-08-11.   Relocated from [place] to [facility] 06/2013 to be near grandchildren.   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   Smoker 20 pack-yr hx: quit about 1980.   Alcohol: 3 bottles of beer per week avg.     Drugs: none          Outpatient Encounter Medications as of 02/27/2019  Medication Sig  . acetaminophen (TYLENOL) 500 MG tablet Take 500-1,000 mg by mouth every 6 (six) hours as needed for mild pain (depends on pain level if takes 500 mg or 1000 mg).   Marland Kitchen amoxicillin (AMOXIL) 500 MG tablet   . aspirin EC 81 MG EC tablet Take 1 tablet (81 mg total) by mouth daily.  . fenofibrate (TRICOR) 145 MG tablet Take 1 tablet (145 mg total) by mouth daily.  . furosemide (LASIX) 20 MG tablet Take 1 tablet (20 mg total) by mouth daily as needed for fluid or edema (2 or more pounds).  . losartan (COZAAR) 100 MG tablet Take 1 tablet (100 mg total) by mouth daily.  . metoprolol succinate (TOPROL-XL) 50 MG 24 hr tablet Take 1 tablet (50 mg total) by mouth daily.  . Multiple Vitamins-Minerals (MULTIVITAMIN PO) Take 1 tablet by mouth daily.   . pantoprazole (PROTONIX) 40 MG tablet Take 1 tablet (40 mg total) by mouth daily.  Marland Kitchen warfarin (COUMADIN) 5 MG tablet Take as directed by Coumadin Clinic  . Zoster Vaccine Adjuvanted [facility]) injection Inject 0.5 mLs into the muscle once for 1 dose.   No facility-administered encounter medications on file as of 02/27/2019.     Activities of Daily Living In your present state of health, do you have any difficulty performing the following activities: 02/27/2019  Hearing? N  Vision? N  Difficulty concentrating or making decisions? N  Walking or climbing stairs? N  Dressing or bathing? N  Doing errands, shopping? N  Preparing Food and eating ? N  Using the Toilet? N  In the past six months, have you accidently leaked urine? N  Do you have problems with loss of bowel control? N  Managing your  Medications? N  Managing  your Finances? N  Housekeeping or managing your Housekeeping? N  Some recent data might be hidden    Patient Care Team: Tammi Sou, MD as PCP - General (Family Medicine) Jerline Pain, MD as Consulting Physician (Cardiology) Rexene Alberts, MD as Consulting Physician (Cardiothoracic Surgery) [name], MD as Consulting Physician (Urology) Lavonne Chick (Dentistry) Macarthur Critchley, OD as Referring Physician (Optometry)   Assessment:   This is a routine wellness examination for Kenneth Ryan.  Exercise Activities and Dietary recommendations Current Exercise Habits: The patient does not participate in regular exercise at present(Maintains house and yard), Exercise limited by: cardiac condition(s)   Diet (meal preparation, eat out, water intake, caffeinated beverages, dairy products, fruits and vegetables): Drinks coke zero.   Breakfast: coffee, OJ, cereal, banana, toast Lunch: sandwich, fast food Dinner: snacks; vegetables.   Goals    . Increase physical activity     Start using treadmill again.     . Increase physical activity       Fall Risk Fall Risk  02/27/2019 02/21/2018 02/15/2017 12/16/2016 09/18/2016  Falls in the past year? 0 No No No No  Risk for fall due to : - - - Medication side effect -    Depression Screen PHQ 2/9 Scores 02/27/2019 02/21/2018 02/15/2017 12/16/2016  PHQ - 2 Score 0 0 0 0    Cognitive Function MMSE - Mini Mental State Exam 02/27/2019 02/15/2017  Orientation to time 5 5  Orientation to Place 5 5  Registration 3 3  Attention/ Calculation 5 5  Recall 2 2  Language- name 2 objects 2 2  Language- repeat 1 1  Language- follow 3 step command 3 3  Language- read & follow direction 1 1  Write a sentence 1 1  Copy design 1 1  Total score 29 29        Immunization History  Administered Date(s) Administered  . Influenza, High Dose Seasonal PF 09/07/2016, 09/07/2017, 09/20/2018  . Influenza,inj,Quad PF,6+ Mos 09/15/2013, 08/28/2015    . Pneumococcal Conjugate-13 08/28/2015  . Pneumococcal Polysaccharide-23 09/18/2016  . Tdap 08/28/2015  . Zoster 05/06/2015     Screening Tests Health Maintenance  Topic Date Due  . Hepatitis C Screening  10/03/2036 (Originally [birth year])  . COLONOSCOPY  12/21/2021  . TETANUS/TDAP  08/27/2025  . INFLUENZA VACCINE  Completed  . PNA vac Low Risk Adult  Completed        Plan:    Shingles vaccine at pharmacy.   Bring a copy of your living will and/or healthcare power of attorney to your next office visit.  Continue doing brain stimulating activities (puzzles, reading, adult coloring books, staying active) to keep memory sharp.   I have personally reviewed and noted the following in the patient's chart:   . Medical and social history . Use of alcohol, tobacco or illicit drugs  . Current medications and supplements . Functional ability and status . Nutritional status . Physical activity . Advanced directives . List of other physicians . Hospitalizations, surgeries, and ER visits in previous 12 months . Vitals . Screenings to include cognitive, depression, and falls . Referrals and appointments  In addition, I have reviewed and discussed with patient certain preventive protocols, quality metrics, and best practice recommendations. A written personalized care plan for preventive services as well as general preventive health recommendations were provided to patient.     Gerilyn Nestle, RN  02/27/2019  F/U with PCP 03/22/2019

## 2019-03-02 NOTE — Progress Notes (Signed)
AWV reviewed and agree.  Signed:  Crissie Sickles, MD           03/02/2019

## 2019-03-08 ENCOUNTER — Ambulatory Visit (INDEPENDENT_AMBULATORY_CARE_PROVIDER_SITE_OTHER): Payer: PPO | Admitting: Pharmacist

## 2019-03-08 ENCOUNTER — Other Ambulatory Visit: Payer: Self-pay

## 2019-03-08 DIAGNOSIS — Z5181 Encounter for therapeutic drug level monitoring: Secondary | ICD-10-CM | POA: Diagnosis not present

## 2019-03-08 DIAGNOSIS — Z9889 Other specified postprocedural states: Secondary | ICD-10-CM | POA: Diagnosis not present

## 2019-03-08 DIAGNOSIS — I719 Aortic aneurysm of unspecified site, without rupture: Secondary | ICD-10-CM | POA: Diagnosis not present

## 2019-03-08 DIAGNOSIS — I7121 Aneurysm of the ascending aorta, without rupture: Secondary | ICD-10-CM

## 2019-03-08 DIAGNOSIS — Z954 Presence of other heart-valve replacement: Secondary | ICD-10-CM | POA: Diagnosis not present

## 2019-03-08 LAB — POCT INR: INR: 2.2 (ref 2.0–3.0)

## 2019-03-08 NOTE — Patient Instructions (Signed)
Description   Continue taking 1/2 tablet daily except 1 tablet on Tuesdays, Thursdays,  and Saturdays.  Recheck INR in 4 weeks. Call Coumadin clinic with any questions [phone].Continue eating green leafy vegetable from 2 servings each week to 3 servings.

## 2019-03-22 ENCOUNTER — Ambulatory Visit (INDEPENDENT_AMBULATORY_CARE_PROVIDER_SITE_OTHER): Payer: PPO | Admitting: Family Medicine

## 2019-03-22 ENCOUNTER — Other Ambulatory Visit: Payer: Self-pay

## 2019-03-22 ENCOUNTER — Encounter: Payer: Self-pay | Admitting: Family Medicine

## 2019-03-22 VITALS — BP 139/70 | HR 56

## 2019-03-22 DIAGNOSIS — E781 Pure hyperglyceridemia: Secondary | ICD-10-CM | POA: Diagnosis not present

## 2019-03-22 DIAGNOSIS — K219 Gastro-esophageal reflux disease without esophagitis: Secondary | ICD-10-CM | POA: Diagnosis not present

## 2019-03-22 DIAGNOSIS — I420 Dilated cardiomyopathy: Secondary | ICD-10-CM | POA: Diagnosis not present

## 2019-03-22 DIAGNOSIS — Z7901 Long term (current) use of anticoagulants: Secondary | ICD-10-CM | POA: Diagnosis not present

## 2019-03-22 NOTE — Progress Notes (Signed)
OFFICE VISIT  03/22/2019   CC:  Chief Complaint  Patient presents with  . Follow-up    RCI   HPI:    Patient is a 73 y.o. Caucasian male With whom I am doing a telephone visit today (due to COVID-19 pandemic restrictions). Prior to proceeding with our visit today, the telephone visit process was explained  and pt gave consent to proceed and understood that this process would be used to assess and treat the patient.  Pt is s/p Bentall procedure -->mechanical aortic valve, repair of aortic root aneurism, and mitral valve repair.  He has mildly reduced EF (dilated CM) as of 05/2017 echo.  He is anticoagulated with coumadin, followed by [facility]. Most recent cardiology f/u 09/2018 all stable.   Also has GERD and hypertriglyceridemia.    6 month f/u:  Interim hx: Feeling well, no complaints. BP good today. Diet is fair, activity level low due to COVID 19 restrictions. Compliant with all meds. GERD well controlled with some dietary changes and daily pantoprazole. Tolerating fenofobrate well, tries to avoid high carb and high fat foods.  ROS: no CP, no SOB, no wheezing, no cough, no dizziness, no HAs, no rashes, no melena/hematochezia.  No polyuria or polydipsia.  No myalgias or arthralgias.   Past Medical History:  Diagnosis Date  . 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  . Basal cell carcinoma    nose  . Chronic hoarseness    secondary to hx of laryngeal surgery  . GERD (gastroesophageal reflux disease)   . Headache   . Heart murmur   . History of tobacco abuse   . Hypertriglyceridemia   . Increased prostate specific antigen (PSA) velocity 09/2016   saw Dr. Jeffie Pollock 11/18/16: plan is to repeat PSA 6 wks and if still same/up then plan TRUS+ bx.  PSA's declined on recheck x 2 and as of 03/10/17 was 2.5.  Recommended annual PSA's with me, f/u with urol prn.  PSA velocity inc  09/2018, repeat PSA 12/07/18 PSA trending up again--recommended pt return to Dr. Jeffie Pollock.  . 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  Marland Kitchen Left ventricular dysfunction 2018   see echo results in Surgical hx section, 01/2017 and 05/2017.  Marland Kitchen Lesion of vocal cord   . Mitral regurgitation    severe by TEE 08/2016:  MV repair done 11/03/16  . Mitral valve prolapse    MV repair 10/2016  . 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  Per CV surg, as of 02/16/17, his INR goal is 1.5-2.5.  Marland Kitchen s/p Bentall procedure for aortic root aneurysm ([place]) 11/03/2016  . 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   Complex valvuloplasty including artificial Gore-tex neochord placement x6 and 28 mm Sorin Memo 3D ring annuloplasty    Past Surgical History:  Procedure Laterality Date  . BENTALL PROCEDURE N/A 11/03/2016   Procedure: BENTALL PROCEDURE AORTIC ROOT REPLACEMENT + mechanical AV;  Surgeon: Rexene Alberts, MD;  Location: [place];  Service: Open Heart Surgery;  Laterality: N/A;  . CARDIAC CATHETERIZATION N/A 09/29/2016   Clean coronaries, EF 60-65%.  Procedure: Right/Left Heart Cath and Coronary  Angiography;  Surgeon: Jettie Booze, MD;  Location: [place] CV LAB;  Service: Cardiovascular;  Laterality: N/A;  . CHOLECYSTECTOMY  1980  . COLONOSCOPY  2003 and 2013   Both normal.  . DIRECT LARYNGOSCOPY Left 10/17/2015   Procedure: MICRO DIRECT LARYNGOSCOPY WITH FROZEN SECTION;  Surgeon: Jodi Marble, MD;  Location: [place];  Service: ENT;  Laterality: Left;  . MITRAL VALVE REPAIR N/A 11/03/2016   Procedure: MITRAL VALVE REPAIR (MVR);  Surgeon: Rexene Alberts, MD;  Location: [place];  Service: Open Heart Surgery;   Laterality: N/A;  . PERIPHERAL VASCULAR CATHETERIZATION N/A 09/29/2016   Procedure: Abdominal Aortogram;  Surgeon: Jettie Booze, MD;  Location: [place] CV LAB;  Service: Cardiovascular;  Laterality: N/A;  . PERIPHERAL VASCULAR CATHETERIZATION N/A 09/29/2016   Procedure: Thoracic Aortogram;  Surgeon: Jettie Booze, MD;  Location: [place] CV LAB;  Service: Cardiovascular;  Laterality: N/A;  . TEE WITHOUT CARDIOVERSION N/A 09/17/2016   Procedure: TRANSESOPHAGEAL ECHOCARDIOGRAM (TEE);  Surgeon: Jerline Pain, MD;  Location: [place];  Service: Cardiovascular;  Laterality: N/A;  . TEE WITHOUT CARDIOVERSION N/A 11/03/2016   Procedure: TRANSESOPHAGEAL ECHOCARDIOGRAM (TEE);  Surgeon: Rexene Alberts, MD;  Location: [place];  Service: Open Heart Surgery;  Laterality: N/A;  . TONSILECTOMY/ADENOIDECTOMY WITH MYRINGOTOMY  1960  . 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  Mod/severe AR w/functional bicuspid AV.  EF 45%.  . TRANSTHORACIC ECHOCARDIOGRAM  07/2015   Bicuspid aortic valve + mod mitral regurg  . TRANSTHORACIC ECHOCARDIOGRAM  02/01/2017   (Post aortic root/valve surgery): EF 35%, diffuse hypokinesis, LV dilatation, grade I DD. Cardiologist added ARB and increased his BB dose.  Lasix prn added 06/2017 due to some LE edema.  . TRANSTHORACIC ECHOCARDIOGRAM  05/2017   LVEF 45-50%.  Grd II DD.  Mechanical AV good, MV repair good.  . Vocal cord biopsy  09/2015   No atypia or inflammation.  Some fibrous changes that are possibly from hx of radiation therapy.    Outpatient Medications Prior to Visit  Medication Sig Dispense Refill  . acetaminophen (TYLENOL) 500 MG tablet Take 500-1,000 mg by mouth every 6 (six) hours as needed for mild pain (depends on pain level if takes 500 mg or 1000 mg).     Marland Kitchen aspirin EC 81 MG EC tablet Take 1 tablet (81 mg total) by mouth daily.    . fenofibrate (TRICOR) 145 MG tablet Take 1 tablet (145 mg total) by mouth  daily. 90 tablet 3  . furosemide (LASIX) 20 MG tablet Take 1 tablet (20 mg total) by mouth daily as needed for fluid or edema (2 or more pounds). 30 tablet 1  . losartan (COZAAR) 100 MG tablet Take 1 tablet (100 mg total) by mouth daily. 90 tablet 3  . metoprolol succinate (TOPROL-XL) 50 MG 24 hr tablet Take 1 tablet (50 mg total) by mouth daily. 90 tablet 3  . Multiple Vitamins-Minerals (MULTIVITAMIN PO) Take 1 tablet by mouth daily.     . pantoprazole (PROTONIX) 40 MG tablet Take 1 tablet (40 mg total) by mouth daily. 90 tablet 1  . warfarin (COUMADIN) 5 MG tablet Take as directed by Coumadin Clinic 90 tablet 1  . amoxicillin (AMOXIL) 500 MG tablet      No facility-administered medications prior to visit.     No Known Allergies  ROS As per HPI  PE: Blood pressure 139/70, pulse (!) 56. Gen: Alert, NAD by voice/interaction on phone.  Patient is oriented to person, place, time, and situation. Lucid thought and speech.   LABS:   Lab Results  Component Value Date   INR 2.2 03/08/2019   INR 1.6 (A) 02/15/2019   INR 1.8 (A) 01/11/2019    Lab Results  Component Value Date   TSH 1.71 11/24/2016   Lab Results  Component Value Date   WBC 6.1 09/20/2018   HGB 14.5 09/20/2018   HCT 43.1 09/20/2018   MCV 85.6 09/20/2018   PLT 212.0 09/20/2018   Lab Results  Component Value Date   CREATININE 1.07 09/20/2018   BUN 17 09/20/2018   NA 143 09/20/2018   K 4.2 09/20/2018   CL 107 09/20/2018   CO2 30 09/20/2018   Lab Results  Component Value Date   ALT 23 09/20/2018   AST 25 09/20/2018   ALKPHOS 41 09/20/2018   BILITOT 0.6 09/20/2018   Lab Results  Component Value Date   CHOL 161 09/20/2018   Lab Results  Component Value Date   HDL 40.40 09/20/2018   Lab Results  Component Value Date   LDLCALC 100 (H) 09/20/2018   Lab Results  Component Value Date   TRIG 104.0 09/20/2018   Lab Results  Component Value Date   CHOLHDL 4 09/20/2018   Lab Results  Component  Value Date   PSA 3.94 12/07/2018   PSA 3.32 09/20/2018   PSA 2.73 09/20/2017   Lab Results  Component Value Date   HGBA1C 5.6 03/22/2018    IMPRESSION AND PLAN:  1) Hypertriglyceridemia: tolerating fibrate well. Lipids excellent 6 mo ago. Plan repeat FLP and hepatic panel in 6 mo.  2) GERD: doing well with diet and taking daily PPI. He is in favor of continuing daily PPI.  3) Dilated CM, with hx of bentall procedure, on chronic anticoag with coumadin. Doing great. Followed by cardiologist.  BMET future. An After Visit Summary was printed and given to the patient.  FOLLOW UP: No follow-ups on file.  Signed:  Crissie Sickles, MD           03/22/2019

## 2019-03-24 ENCOUNTER — Other Ambulatory Visit: Payer: PPO

## 2019-03-29 ENCOUNTER — Other Ambulatory Visit: Payer: Self-pay

## 2019-03-29 ENCOUNTER — Encounter: Payer: Self-pay | Admitting: Family Medicine

## 2019-03-29 ENCOUNTER — Other Ambulatory Visit (INDEPENDENT_AMBULATORY_CARE_PROVIDER_SITE_OTHER): Payer: PPO

## 2019-03-29 DIAGNOSIS — I420 Dilated cardiomyopathy: Secondary | ICD-10-CM

## 2019-03-29 DIAGNOSIS — D225 Melanocytic nevi of trunk: Secondary | ICD-10-CM | POA: Diagnosis not present

## 2019-03-29 DIAGNOSIS — M810 Age-related osteoporosis without current pathological fracture: Secondary | ICD-10-CM | POA: Diagnosis not present

## 2019-03-29 DIAGNOSIS — R972 Elevated prostate specific antigen [PSA]: Secondary | ICD-10-CM | POA: Diagnosis not present

## 2019-03-29 DIAGNOSIS — Z7901 Long term (current) use of anticoagulants: Secondary | ICD-10-CM | POA: Diagnosis not present

## 2019-03-29 DIAGNOSIS — E7800 Pure hypercholesterolemia, unspecified: Secondary | ICD-10-CM | POA: Diagnosis not present

## 2019-03-29 DIAGNOSIS — L821 Other seborrheic keratosis: Secondary | ICD-10-CM | POA: Diagnosis not present

## 2019-03-29 DIAGNOSIS — D2272 Melanocytic nevi of left lower limb, including hip: Secondary | ICD-10-CM | POA: Diagnosis not present

## 2019-03-29 LAB — BASIC METABOLIC PANEL
BUN: 15 mg/dL (ref 6–23)
CO2: 29 mEq/L (ref 19–32)
Calcium: 9 mg/dL (ref 8.4–10.5)
Chloride: 106 mEq/L (ref 96–112)
Creatinine, Ser: 1.02 mg/dL (ref 0.40–1.50)
GFR: 72.02 mL/min (ref >60.00)
Glucose, Bld: 115 mg/dL — ABNORMAL HIGH (ref 70–99)
Potassium: 4.1 mEq/L (ref 3.5–5.1)
Sodium: 141 mEq/L (ref 135–145)

## 2019-03-29 LAB — PSA

## 2019-03-30 ENCOUNTER — Telehealth: Payer: Self-pay

## 2019-03-30 NOTE — Telephone Encounter (Signed)
We are unable to add a A1C to this lab (BMP is a gold tube and you need a lavender for a A1C)

## 2019-03-30 NOTE — Telephone Encounter (Signed)
OK, thanks for trying.

## 2019-03-30 NOTE — Telephone Encounter (Signed)
-----   Message from Tammi Sou, MD sent at 03/29/2019  4:25 PM EDT ----- Can you add a HbA1c to these labs?  Dx is Impaired fasting glucose.-thx

## 2019-04-05 DIAGNOSIS — R972 Elevated prostate specific antigen [PSA]: Secondary | ICD-10-CM | POA: Diagnosis not present

## 2019-04-05 DIAGNOSIS — R351 Nocturia: Secondary | ICD-10-CM | POA: Diagnosis not present

## 2019-04-06 ENCOUNTER — Encounter: Payer: Self-pay | Admitting: Family Medicine

## 2019-04-07 ENCOUNTER — Telehealth: Payer: Self-pay

## 2019-04-07 ENCOUNTER — Other Ambulatory Visit: Payer: Self-pay | Admitting: Cardiology

## 2019-04-07 NOTE — Telephone Encounter (Signed)

## 2019-04-10 ENCOUNTER — Other Ambulatory Visit: Payer: Self-pay

## 2019-04-10 ENCOUNTER — Ambulatory Visit (INDEPENDENT_AMBULATORY_CARE_PROVIDER_SITE_OTHER): Payer: PPO | Admitting: *Deleted

## 2019-04-10 ENCOUNTER — Encounter: Payer: Self-pay | Admitting: Family Medicine

## 2019-04-10 DIAGNOSIS — Q2543 Congenital aneurysm of aorta: Secondary | ICD-10-CM

## 2019-04-10 DIAGNOSIS — I7121 Aneurysm of the ascending aorta, without rupture: Secondary | ICD-10-CM

## 2019-04-10 DIAGNOSIS — I719 Aortic aneurysm of unspecified site, without rupture: Secondary | ICD-10-CM | POA: Diagnosis not present

## 2019-04-10 DIAGNOSIS — Z9889 Other specified postprocedural states: Secondary | ICD-10-CM | POA: Diagnosis not present

## 2019-04-10 DIAGNOSIS — Z5181 Encounter for therapeutic drug level monitoring: Secondary | ICD-10-CM

## 2019-04-10 DIAGNOSIS — Z954 Presence of other heart-valve replacement: Secondary | ICD-10-CM | POA: Diagnosis not present

## 2019-04-10 LAB — POCT INR: INR: 2.2 (ref 2.0–3.0)

## 2019-05-05 ENCOUNTER — Telehealth: Payer: Self-pay

## 2019-05-05 NOTE — Telephone Encounter (Signed)

## 2019-05-08 ENCOUNTER — Ambulatory Visit (INDEPENDENT_AMBULATORY_CARE_PROVIDER_SITE_OTHER): Payer: PPO | Admitting: *Deleted

## 2019-05-08 ENCOUNTER — Other Ambulatory Visit: Payer: Self-pay

## 2019-05-08 DIAGNOSIS — Z9889 Other specified postprocedural states: Secondary | ICD-10-CM

## 2019-05-08 DIAGNOSIS — I7121 Aneurysm of the ascending aorta, without rupture: Secondary | ICD-10-CM

## 2019-05-08 DIAGNOSIS — I719 Aortic aneurysm of unspecified site, without rupture: Secondary | ICD-10-CM | POA: Diagnosis not present

## 2019-05-08 DIAGNOSIS — Z5181 Encounter for therapeutic drug level monitoring: Secondary | ICD-10-CM

## 2019-05-08 DIAGNOSIS — Z954 Presence of other heart-valve replacement: Secondary | ICD-10-CM

## 2019-05-08 LAB — POCT INR: INR: 2.3 (ref 2.0–3.0)

## 2019-05-08 NOTE — Patient Instructions (Signed)
Description   Spoke with pt and instructed pt to continue taking 1/2 tablet daily except 1 tablet on Tuesdays, Thursdays,  and Saturdays.  Recheck INR in 6 weeks. Call Coumadin clinic with any questions [phone].Continue eating green leafy vegetable from 2 servings each week to 3 servings.

## 2019-06-06 ENCOUNTER — Telehealth: Payer: Self-pay | Admitting: Family Medicine

## 2019-06-06 MED ORDER — PANTOPRAZOLE SODIUM 40 MG PO TBEC: 40.0000 mg | DELAYED_RELEASE_TABLET | Freq: Every day | ORAL | 1 refills | Status: DC

## 2019-06-06 NOTE — Telephone Encounter (Signed)
Medications have been sent to pharmacy. Patient is UTD on appointments.

## 2019-06-06 NOTE — Telephone Encounter (Signed)
Original Order:  pantoprazole (PROTONIX) 40 MG tablet [[id number]]    Pharmacy:  [id number] - [place], [place] - [address]    Thank you

## 2019-06-12 ENCOUNTER — Telehealth: Payer: Self-pay

## 2019-06-12 NOTE — Telephone Encounter (Signed)
lmom for prescreen  

## 2019-06-12 NOTE — Telephone Encounter (Signed)

## 2019-06-19 ENCOUNTER — Ambulatory Visit (INDEPENDENT_AMBULATORY_CARE_PROVIDER_SITE_OTHER): Payer: PPO | Admitting: *Deleted

## 2019-06-19 ENCOUNTER — Other Ambulatory Visit: Payer: Self-pay

## 2019-06-19 DIAGNOSIS — Z5181 Encounter for therapeutic drug level monitoring: Secondary | ICD-10-CM

## 2019-06-19 DIAGNOSIS — Z954 Presence of other heart-valve replacement: Secondary | ICD-10-CM

## 2019-06-19 DIAGNOSIS — I719 Aortic aneurysm of unspecified site, without rupture: Secondary | ICD-10-CM | POA: Diagnosis not present

## 2019-06-19 DIAGNOSIS — I7121 Aneurysm of the ascending aorta, without rupture: Secondary | ICD-10-CM

## 2019-06-19 DIAGNOSIS — Z9889 Other specified postprocedural states: Secondary | ICD-10-CM

## 2019-06-19 LAB — POCT INR: INR: 2.6 (ref 2.0–3.0)

## 2019-06-19 NOTE — Patient Instructions (Signed)
Description    Continue taking 1/2 tablet daily except 1 tablet on Tuesdays, Thursdays,  and Saturdays.  Recheck INR in 4 weeks. Call Coumadin clinic with any questions [phone]. Eat an extra serving of leafy green vegetables today. Continue eating green leafy vegetable from 2 servings each week to 3 servings.

## 2019-07-13 ENCOUNTER — Telehealth: Payer: Self-pay | Admitting: *Deleted

## 2019-07-13 NOTE — Telephone Encounter (Signed)
LMOVM to call back for Prescreen Covid Questions.

## 2019-07-13 NOTE — Telephone Encounter (Signed)

## 2019-07-17 ENCOUNTER — Ambulatory Visit (INDEPENDENT_AMBULATORY_CARE_PROVIDER_SITE_OTHER): Payer: PPO

## 2019-07-17 ENCOUNTER — Other Ambulatory Visit: Payer: Self-pay

## 2019-07-17 DIAGNOSIS — Z9889 Other specified postprocedural states: Secondary | ICD-10-CM | POA: Diagnosis not present

## 2019-07-17 DIAGNOSIS — I7121 Aneurysm of the ascending aorta, without rupture: Secondary | ICD-10-CM

## 2019-07-17 DIAGNOSIS — Z5181 Encounter for therapeutic drug level monitoring: Secondary | ICD-10-CM | POA: Diagnosis not present

## 2019-07-17 DIAGNOSIS — Z954 Presence of other heart-valve replacement: Secondary | ICD-10-CM | POA: Diagnosis not present

## 2019-07-17 DIAGNOSIS — I719 Aortic aneurysm of unspecified site, without rupture: Secondary | ICD-10-CM

## 2019-07-17 LAB — POCT INR: INR: 2.4 (ref 2.0–3.0)

## 2019-07-17 NOTE — Patient Instructions (Signed)
Description    Continue taking 1/2 tablet daily except 1 tablet on Tuesdays, Thursdays,  and Saturdays.  Recheck INR in 6 weeks. Call Coumadin clinic with any questions [phone]. Eat an extra serving of leafy green vegetables today. Continue eating green leafy vegetable from 2 servings each week to 3 servings.

## 2019-08-29 ENCOUNTER — Other Ambulatory Visit: Payer: Self-pay

## 2019-08-29 ENCOUNTER — Ambulatory Visit (INDEPENDENT_AMBULATORY_CARE_PROVIDER_SITE_OTHER): Payer: PPO | Admitting: *Deleted

## 2019-08-29 DIAGNOSIS — Z9889 Other specified postprocedural states: Secondary | ICD-10-CM | POA: Diagnosis not present

## 2019-08-29 DIAGNOSIS — Z954 Presence of other heart-valve replacement: Secondary | ICD-10-CM | POA: Diagnosis not present

## 2019-08-29 DIAGNOSIS — I719 Aortic aneurysm of unspecified site, without rupture: Secondary | ICD-10-CM

## 2019-08-29 DIAGNOSIS — Z5181 Encounter for therapeutic drug level monitoring: Secondary | ICD-10-CM

## 2019-08-29 DIAGNOSIS — I7121 Aneurysm of the ascending aorta, without rupture: Secondary | ICD-10-CM

## 2019-08-29 LAB — POCT INR: INR: 2.1 (ref 2.0–3.0)

## 2019-08-29 NOTE — Patient Instructions (Signed)
Description    Continue taking 1/2 tablet daily except 1 tablet on Tuesdays, Thursdays, and Saturdays.  Recheck INR in 7 weeks. Call Coumadin clinic with any questions [phone].

## 2019-09-15 ENCOUNTER — Other Ambulatory Visit: Payer: Self-pay | Admitting: Cardiology

## 2019-09-22 ENCOUNTER — Encounter: Payer: PPO | Admitting: Family Medicine

## 2019-09-27 DIAGNOSIS — R972 Elevated prostate specific antigen [PSA]: Secondary | ICD-10-CM | POA: Diagnosis not present

## 2019-09-28 LAB — PSA: PSA: 4.03

## 2019-10-01 ENCOUNTER — Other Ambulatory Visit: Payer: Self-pay | Admitting: Cardiology

## 2019-10-04 DIAGNOSIS — R972 Elevated prostate specific antigen [PSA]: Secondary | ICD-10-CM | POA: Diagnosis not present

## 2019-10-04 DIAGNOSIS — R351 Nocturia: Secondary | ICD-10-CM | POA: Diagnosis not present

## 2019-10-04 DIAGNOSIS — N401 Enlarged prostate with lower urinary tract symptoms: Secondary | ICD-10-CM | POA: Diagnosis not present

## 2019-10-13 ENCOUNTER — Encounter: Payer: Self-pay | Admitting: Cardiology

## 2019-10-13 ENCOUNTER — Ambulatory Visit: Payer: PPO | Admitting: Cardiology

## 2019-10-13 ENCOUNTER — Other Ambulatory Visit: Payer: Self-pay

## 2019-10-13 VITALS — BP 118/72 | HR 57 | Ht 72.0 in | Wt 223.2 lb

## 2019-10-13 DIAGNOSIS — I10 Essential (primary) hypertension: Secondary | ICD-10-CM | POA: Diagnosis not present

## 2019-10-13 DIAGNOSIS — Z7901 Long term (current) use of anticoagulants: Secondary | ICD-10-CM

## 2019-10-13 DIAGNOSIS — Z9889 Other specified postprocedural states: Secondary | ICD-10-CM | POA: Diagnosis not present

## 2019-10-13 DIAGNOSIS — I420 Dilated cardiomyopathy: Secondary | ICD-10-CM | POA: Diagnosis not present

## 2019-10-13 DIAGNOSIS — Z954 Presence of other heart-valve replacement: Secondary | ICD-10-CM | POA: Diagnosis not present

## 2019-10-13 NOTE — Patient Instructions (Signed)
Medication Instructions:  The current medical regimen is effective;  continue present plan and medications.  *If you need a refill on your cardiac medications before your next appointment, please call your pharmacy*  Follow-Up: At [facility], you and your health needs are our priority.  As part of our continuing mission to provide you with exceptional heart care, we have created designated Provider Care Teams.  These Care Teams include your primary Cardiologist (physician) and Advanced Practice Providers (APPs -  Physician Assistants and Nurse Practitioners) who all work together to provide you with the care you need, when you need it.  Your next appointment:   12 months  The format for your next appointment:   In Person  Provider:   You may see Dr Candee Furbish or one of the following Advanced Practice Providers on your designated Care Team:    Truitt Merle, NP  Cecilie Kicks, NP  Kathyrn Drown, NP   Thank you for choosing [facility]!!

## 2019-10-13 NOTE — Progress Notes (Signed)
Cardiology Office Note:    Date:  10/13/2019   ID:  Kenneth Ryan, DOB 03/06/1946, MRN [id number]  PCP:  [name], Philip H, MD  Cardiologist:  No primary care provider on file.  Electrophysiologist:  None   Referring MD: [name], Philip H, MD    History of Present Illness:    Kenneth Ryan is a 73 y.o. male here for follow-up aortic root replacement, Bentall procedure with On-X bileaflet mechanical valve, synthetic root conduit and mitral valve repair on 11/03/2016 for bicuspid aortic valve, severe aortic regurgitation, dilated aortic root, MVP with severe mitral regurgitation.  Office note from Dr. Owen reviewed from 11/15/2017, he was doing very well.  Reminder that he was asymptomatic prior to surgery.  Cardiac catheterization prior to surgery revealed no coronary artery disease.  Ejection fraction postoperatively was 35%, then improved to 45% on 06/08/2017.  Overall has been feeling quite well.  He tells me that yesterday he walked over a mile and a half in the heat and did well.  Unfortunately his grandchildren were in the bus that flipped over but they did fine, small scratches.  10/13/2019-here for the follow-up of  Valve replacement.  Cardiomyopathy.  Overall been doing quite well.  No fevers chills nausea vomiting syncope bleeding.  Tired at times. Feels in eyes. Making a car, kit.  Taking his medications.  He does admit that twice he forgot to take it 1 day and felt poor.  Past Medical History:  Diagnosis Date  . 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 Aortic root aneurysm (HCC)    Repaired 11/03/16.  . Basal cell carcinoma    nose  . Chronic hoarseness    secondary to hx of laryngeal surgery  . GERD (gastroesophageal reflux disease)   . Headache   . Heart murmur   . History of tobacco abuse   . Hypertriglyceridemia   . IFG (impaired fasting glucose) 2018   A1c 5.4-5.6.  . Increased prostate specific antigen  (PSA) velocity 09/2016   saw Dr. [name] 11/18/16: plan is to repeat PSA 6 wks and if still same/up then plan TRUS+ bx.  PSA's declined on recheck x 2 and as of 03/10/17 was 2.5.  Recommended annual PSA's with me, f/u with urol prn.  PSA velocity inc 09/2018, repeat PSA 12/07/18 PSA trending up again->to urol->repeats Jan and Apr 2020 trending down  . Laryngeal cancer (HCC) 2002   Surgery and radiation---He had post treatment surveillance x 12 yrs and was officially "released" from f/u in summer 2014.  . Left ventricular dysfunction 2018   see echo results in Surgical hx section, 01/2017 and 05/2017.  . Lesion of vocal cord   . Mitral regurgitation    severe by TEE 08/2016:  MV repair done 11/03/16  . Mitral valve prolapse    MV repair 10/2016  . 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  Per CV surg, as of 02/16/17, his INR goal is 1.5-2.5.  . s/p Bentall procedure for aortic root aneurysm (HCC) 11/03/2016  . 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   Complex valvuloplasty including artificial Gore-tex neochord placement x6 and 28 mm Sorin Memo 3D ring annuloplasty    Past Surgical History:  Procedure Laterality Date  . BENTALL PROCEDURE N/A 11/03/2016   Procedure: BENTALL PROCEDURE   AORTIC ROOT REPLACEMENT + mechanical AV;  Surgeon: Clarence H Owen, MD;  Location: [facility] OR;  Service: Open Heart Surgery;  Laterality: N/A;  . CARDIAC CATHETERIZATION N/A 09/29/2016   Clean coronaries, EF 60-65%.  Procedure: Right/Left Heart Cath and Coronary Angiography;  Surgeon: Jayadeep S Varanasi, MD;  Location: [facility] INVASIVE CV LAB;  Service: Cardiovascular;  Laterality: N/A;  . CHOLECYSTECTOMY  1980  . COLONOSCOPY  2003 and 2013   Both normal.  . DIRECT LARYNGOSCOPY Left 10/17/2015   Procedure: MICRO DIRECT LARYNGOSCOPY WITH  FROZEN SECTION;  Surgeon: Karol Wolicki, MD;  Location: [facility] OR;  Service: ENT;  Laterality: Left;  . MITRAL VALVE REPAIR N/A 11/03/2016   Procedure: MITRAL VALVE REPAIR (MVR);  Surgeon: Clarence H Owen, MD;  Location: [facility] OR;  Service: Open Heart Surgery;  Laterality: N/A;  . PERIPHERAL VASCULAR CATHETERIZATION N/A 09/29/2016   Procedure: Abdominal Aortogram;  Surgeon: Jayadeep S Varanasi, MD;  Location: [facility] INVASIVE CV LAB;  Service: Cardiovascular;  Laterality: N/A;  . PERIPHERAL VASCULAR CATHETERIZATION N/A 09/29/2016   Procedure: Thoracic Aortogram;  Surgeon: Jayadeep S Varanasi, MD;  Location: [facility] INVASIVE CV LAB;  Service: Cardiovascular;  Laterality: N/A;  . TEE WITHOUT CARDIOVERSION N/A 09/17/2016   Procedure: TRANSESOPHAGEAL ECHOCARDIOGRAM (TEE);  Surgeon: Mark C Skains, MD;  Location: [facility] ENDOSCOPY;  Service: Cardiovascular;  Laterality: N/A;  . TEE WITHOUT CARDIOVERSION N/A 11/03/2016   Procedure: TRANSESOPHAGEAL ECHOCARDIOGRAM (TEE);  Surgeon: Clarence H Owen, MD;  Location: [facility] OR;  Service: Open Heart Surgery;  Laterality: N/A;  . TONSILECTOMY/ADENOIDECTOMY WITH MYRINGOTOMY  1960  . 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  Mod/severe AR w/functional bicuspid AV.  EF 45%.  . TRANSTHORACIC ECHOCARDIOGRAM  07/2015   Bicuspid aortic valve + mod mitral regurg  . TRANSTHORACIC ECHOCARDIOGRAM  02/01/2017   (Post aortic root/valve surgery): EF 35%, diffuse hypokinesis, LV dilatation, grade I DD. Cardiologist added ARB and increased his BB dose.  Lasix prn added 06/2017 due to some LE edema.  . TRANSTHORACIC ECHOCARDIOGRAM  05/2017   LVEF 45-50%.  Grd II DD.  Mechanical AV good, MV repair good.  . Vocal cord biopsy  09/2015   No atypia or inflammation.  Some fibrous changes that are possibly from hx of radiation therapy.    Current Medications: Current Meds  Medication Sig  . acetaminophen (TYLENOL) 500 MG tablet Take 500-1,000 mg by mouth every 6 (six)  hours as needed for mild pain (depends on pain level if takes 500 mg or 1000 mg).   . amoxicillin (AMOXIL) 500 MG tablet   . aspirin EC 81 MG EC tablet Take 1 tablet (81 mg total) by mouth daily.  . fenofibrate (TRICOR) 145 MG tablet Take 1 tablet (145 mg total) by mouth daily.  . furosemide (LASIX) 20 MG tablet Take 1 tablet (20 mg total) by mouth daily as needed for fluid or edema (2 or more pounds).  . losartan (COZAAR) 100 MG tablet Take 1 tablet (100 mg total) by mouth daily. Please keep upcoming appt with Dr. Skains for October for future refills. Thank you  . metoprolol succinate (TOPROL-XL) 50 MG 24 hr tablet Take 1 tablet (50 mg total) by mouth daily.  . Multiple Vitamins-Minerals (MULTIVITAMIN PO) Take 1 tablet by mouth daily.   . pantoprazole (PROTONIX) 40 MG tablet Take 1 tablet (40 mg total) by mouth daily.  . warfarin (COUMADIN) 5 MG tablet TAKE AS DIRECTED BY COUMADIN CLINIC     Allergies:   Patient   has no known allergies.   Social History   Socioeconomic History  . Marital status: Widowed    Spouse name: Not on file  . Number of children: Not on file  . Years of education: Not on file  . Highest education level: Not on file  Occupational History  . Not on file  Social Needs  . Financial resource strain: Not on file  . Food insecurity    Worry: Not on file    Inability: Not on file  . Transportation needs    Medical: Not on file    Non-medical: Not on file  Tobacco Use  . Smoking status: Former Smoker    Quit date: 07/22/1979    Years since quitting: 40.2  . Smokeless tobacco: Never Used  Substance and Sexual Activity  . Alcohol use: Yes    Comment: rare  . Drug use: No  . Sexual activity: Not on file  Lifestyle  . Physical activity    Days per week: Not on file    Minutes per session: Not on file  . Stress: Not on file  Relationships  . Social Herbalist on phone: Not on file    Gets together: Not on file    Attends religious service: Not on  file    Active member of club or organization: Not on file    Attends meetings of clubs or organizations: Not on file    Relationship status: Not on file  Other Topics Concern  . Not on file  Social History Narrative   Married, one son deceased 01/26/2011.   Relocated from [place] to [facility] 06/2013 to be near grandchildren.   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   Smoker 20 pack-yr hx: quit about 1980.   Alcohol: 3 bottles of beer per week avg.     Drugs: none           Family History: The patient's family history includes Breast cancer in his mother, sister, and sister; Diabetes in his brother; Heart disease in his father.  ROS:   Please see the history of present illness.     All other systems reviewed and are negative.  EKGs/Labs/Other Studies Reviewed:    The following studies were reviewed today:  ECHO 06/08/17:  - Left ventricle: The cavity size was normal. Systolic function was   mildly reduced. The estimated ejection fraction was in the range   of 45% to 50%. Features are consistent with a pseudonormal left   ventricular filling pattern, with concomitant abnormal relaxation   and increased filling pressure (grade 2 diastolic dysfunction). - Aortic valve: A mechanical prosthesis was present. - Mitral valve: Prior procedures included surgical repair.  EKG: 10/13/2019-sinus bradycardia 53 no other significant abnormalities 09/21/2018-sinus rhythm 60 with no other abnormalities personally reviewed and interpreted.  Recent Labs: 03/29/2019: BUN 15; Creatinine, Ser 1.02; Potassium 4.1; Sodium 141  Recent Lipid Panel    Component Value Date/Time   CHOL 161 09/20/2018 0838   TRIG 104.0 09/20/2018 0838   HDL 40.40 09/20/2018 0838   CHOLHDL 4 09/20/2018 0838   VLDL 20.8 09/20/2018 0838   LDLCALC 100 (H) 09/20/2018 0838    Physical Exam:    VS:  BP 118/72   Pulse (!) 57   Ht 6' (1.829 m)   Wt 223 lb 3.2 oz (101.2 kg)   SpO2 98%   BMI 30.27 kg/m     Wt Readings from Last 3  Encounters:  10/13/19 223 lb 3.2 oz (101.2  kg)  02/27/19 232 lb 2 oz (105.3 kg)  09/21/18 227 lb 3.2 oz (103.1 kg)     GEN: Well nourished, well developed, in no acute distress  HEENT: normal  Neck: no JVD, carotid bruits, or masses Cardiac: Sharp S2 click RRR; no murmurs, rubs, or gallops,no edema  Respiratory:  clear to auscultation bilaterally, normal work of breathing GI: soft, nontender, nondistended, + BS MS: no deformity or atrophy  Skin: warm and dry, no rash Neuro:  Alert and Oriented x 3, Strength and sensation are intact Psych: euthymic mood, full affect    ASSESSMENT:    1. Dilated cardiomyopathy ([place])   2. Essential hypertension   3. S/P aortic valve replacement with metallic valve   4. S/P mitral valve repair   5. Chronic anticoagulation    PLAN:    In order of problems listed above:  Bentall, mechanical aortic valve 2017  -Reviewed with patient.  Reviewed Dr. Guy Sandifer prior note.  Doing very well.  Dental prophylaxis.  Sharp S2 click noted on exam.  No changes made.  Try to avoid heavy excessive lifting with his mild ventral hernia noted at surgical site inferiorly.   If this were to get worse, we would have him visit once again with Dr. Roxy Manns.  Seems stable.  No changes.  Blood work has been reviewed.  Mitral valve repair  -EF has dropped post repair which is to be expected.  Overall doing quite well.  No symptoms pre-or post repair.  Excellent.  Chronic anticoagulation  -INR 2-3 with mechanical AVR.  Doing very well.  Coumadin clinic here in our clinic has been exceptional. - Hemoglobin 13.9, creatinine 0.9  Essential hypertension  -Blood pressure under excellent control.  Medications reviewed.  Non ischemic cardiomyopathy, dilated cardiomyopathy  -On both beta-blocker, Toprol and angiotensin receptor blocker losartan.  . - Low-dose Lasix when necessary. He has taken sparingly.  EF seems improved 45 to 50%.  - Overall been doing quite well.  No changes  made.    Medication Adjustments/Labs and Tests Ordered: Current medicines are reviewed at length with the patient today.  Concerns regarding medicines are outlined above.  Orders Placed This Encounter  Procedures  . EKG 12-Lead   No orders of the defined types were placed in this encounter.   Patient Instructions  Medication Instructions:  The current medical regimen is effective;  continue present plan and medications.  *If you need a refill on your cardiac medications before your next appointment, please call your pharmacy*  Follow-Up: At [facility], you and your health needs are our priority.  As part of our continuing mission to provide you with exceptional heart care, we have created designated Provider Care Teams.  These Care Teams include your primary Cardiologist (physician) and Advanced Practice Providers (APPs -  Physician Assistants and Nurse Practitioners) who all work together to provide you with the care you need, when you need it.  Your next appointment:   12 months  The format for your next appointment:   In Person  Provider:   You may see Dr Candee Furbish or one of the following Advanced Practice Providers on your designated Care Team:    Truitt Merle, NP  Cecilie Kicks, NP  Kathyrn Drown, NP   Thank you for choosing [facility]!!         Signed, Candee Furbish, MD  10/13/2019 9:22 AM    [place]

## 2019-10-17 ENCOUNTER — Ambulatory Visit: Payer: PPO

## 2019-10-17 ENCOUNTER — Encounter: Payer: Self-pay | Admitting: Family Medicine

## 2019-10-17 ENCOUNTER — Other Ambulatory Visit: Payer: Self-pay

## 2019-10-17 DIAGNOSIS — I7121 Aneurysm of the ascending aorta, without rupture: Secondary | ICD-10-CM

## 2019-10-17 DIAGNOSIS — Z5181 Encounter for therapeutic drug level monitoring: Secondary | ICD-10-CM | POA: Diagnosis not present

## 2019-10-17 DIAGNOSIS — I719 Aortic aneurysm of unspecified site, without rupture: Secondary | ICD-10-CM

## 2019-10-17 DIAGNOSIS — Z954 Presence of other heart-valve replacement: Secondary | ICD-10-CM

## 2019-10-17 DIAGNOSIS — Z9889 Other specified postprocedural states: Secondary | ICD-10-CM

## 2019-10-17 LAB — POCT INR: INR: 1.6 — AB (ref 2.0–3.0)

## 2019-10-17 NOTE — Patient Instructions (Signed)
Description   Take an extra 1/2 tablet today and tomorrow, then resume same dosage 1/2 tablet daily except 1 tablet on Tuesdays, Thursdays, and Saturdays.  Recheck INR in 3 weeks. Call Coumadin clinic with any questions [phone].

## 2019-10-18 ENCOUNTER — Encounter: Payer: Self-pay | Admitting: Family Medicine

## 2019-10-18 ENCOUNTER — Ambulatory Visit (INDEPENDENT_AMBULATORY_CARE_PROVIDER_SITE_OTHER): Payer: PPO | Admitting: Family Medicine

## 2019-10-18 VITALS — BP 110/69 | HR 54 | Temp 98.2°F | Resp 16 | Ht 72.0 in | Wt 222.6 lb

## 2019-10-18 DIAGNOSIS — Z952 Presence of prosthetic heart valve: Secondary | ICD-10-CM | POA: Diagnosis not present

## 2019-10-18 DIAGNOSIS — Z7901 Long term (current) use of anticoagulants: Secondary | ICD-10-CM

## 2019-10-18 DIAGNOSIS — E781 Pure hyperglyceridemia: Secondary | ICD-10-CM | POA: Diagnosis not present

## 2019-10-18 DIAGNOSIS — R972 Elevated prostate specific antigen [PSA]: Secondary | ICD-10-CM

## 2019-10-18 DIAGNOSIS — R7301 Impaired fasting glucose: Secondary | ICD-10-CM | POA: Diagnosis not present

## 2019-10-18 DIAGNOSIS — Z0000 Encounter for general adult medical examination without abnormal findings: Secondary | ICD-10-CM | POA: Diagnosis not present

## 2019-10-18 LAB — CBC WITH DIFFERENTIAL/PLATELET
Basophils Absolute: 0 10*3/uL (ref 0.0–0.1)
Basophils Relative: 0.7 % (ref 0.0–3.0)
Eosinophils Absolute: 0 10*3/uL (ref 0.0–0.7)
Eosinophils Relative: 0 % (ref 0.0–5.0)
HCT: 42.5 % (ref 39.0–52.0)
Hemoglobin: 14.4 g/dL (ref 13.0–17.0)
Lymphocytes Relative: 39.1 % (ref 12.0–46.0)
Lymphs Abs: 1.8 10*3/uL (ref 0.7–4.0)
MCHC: 33.9 g/dL (ref 30.0–36.0)
MCV: 86.4 fl (ref 78.0–100.0)
Monocytes Absolute: 0.4 10*3/uL (ref 0.1–1.0)
Monocytes Relative: 8.5 % (ref 3.0–12.0)
Neutro Abs: 2.4 10*3/uL (ref 1.4–7.7)
Neutrophils Relative %: 51.7 % (ref 43.0–77.0)
Platelets: 181 10*3/uL (ref 150.0–400.0)
RBC: 4.92 Mil/uL (ref 4.22–5.81)
RDW: 13.7 % (ref 11.5–15.5)
WBC: 4.7 10*3/uL (ref 4.0–10.5)

## 2019-10-18 LAB — COMPREHENSIVE METABOLIC PANEL
ALT: 19 U/L (ref 0–53)
AST: 27 U/L (ref 0–37)
Albumin: 4.1 g/dL (ref 3.5–5.2)
Alkaline Phosphatase: 42 U/L (ref 39–117)
BUN: 16 mg/dL (ref 6–23)
CO2: 30 mEq/L (ref 19–32)
Calcium: 9.3 mg/dL (ref 8.4–10.5)
Chloride: 107 mEq/L (ref 96–112)
Creatinine, Ser: 0.99 mg/dL (ref 0.40–1.50)
GFR: 74.43 mL/min (ref >60.00)
Glucose, Bld: 94 mg/dL (ref 70–99)
Potassium: 4.5 mEq/L (ref 3.5–5.1)
Sodium: 141 mEq/L (ref 135–145)
Total Bilirubin: 0.7 mg/dL (ref 0.2–1.2)
Total Protein: 6.3 g/dL (ref 6.0–8.3)

## 2019-10-18 LAB — LIPID PANEL
Cholesterol: 165 mg/dL (ref 0–200)
HDL: 37.2 mg/dL — ABNORMAL LOW (ref >39.00)
LDL Cholesterol: 106 mg/dL — ABNORMAL HIGH (ref 0–99)
NonHDL: 128.1
Total CHOL/HDL Ratio: 4
Triglycerides: 110 mg/dL (ref 0.0–149.0)
VLDL: 22 mg/dL (ref 0.0–40.0)

## 2019-10-18 NOTE — Progress Notes (Signed)
Office Note 10/18/2019  CC:  Chief Complaint  Patient presents with  . Annual Exam    pt is fasting    HPI:  Kenneth Ryan is a 73 y.o. White male who is here for annual health maintenance exam.  Recent cardiology f/u 10/13/19->reviewed notes, all stable, no changes, rec'd f/u 1 yr.  Exercise: building a car.  No other exercise at this time. Diet: varied and not significantly restricted. Feeling well, no questions.   Past Medical History:  Diagnosis Date  . 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  . Basal cell carcinoma    nose  . Chronic hoarseness    secondary to hx of laryngeal surgery  . GERD (gastroesophageal reflux disease)   . Headache   . Heart murmur   . History of tobacco abuse   . Hypertriglyceridemia   . IFG (impaired fasting glucose) 2018   A1c 5.4-5.6.  . Increased prostate specific antigen (PSA) velocity 09/2016   saw Dr. Jeffie Pollock 11/18/16: plan is to repeat PSA 6 wks and if still same/up then plan TRUS+ bx.  PSA's declined on recheck x 2 and as of 03/10/17 was 2.5.  Recommended annual PSA's with me, f/u with urol prn.  PSA velocity inc 09/2018, repeat PSA 12/07/18 PSA trending up again->to urol->repeats Jan and Apr 2020 trending down  . Laryngeal cancer [facility]) 2002   Surgery and radiation---He had post treatment surveillance x 12 yrs and was officially "released" from f/u in summer 2014.  Marland Kitchen Lesion of vocal cord   . Mitral regurgitation    severe by TEE 08/2016:  MV repair done 11/03/16  . Mitral valve prolapse    MV repair 10/2016  . Nonischemic cardiomyopathy ([place]) 2018   dilated LV, systolic dysfunction: see echo results in Surgical hx section, 01/2017 and 05/2017.  [name] aortic root replacement with bileaflet mechanical prosthetic valve conduit 11/03/2016   Size 27/29 mm On-X bileaflet mechanical valve and synthetic root conduit with  reimplantation of left main and right coronary arteries.  Per CV surg, as of 02/16/17, his INR goal is 1.5-2.5.  Marland Kitchen s/p Bentall procedure for aortic root aneurysm ([place]) 11/03/2016  . 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   Complex valvuloplasty including artificial Gore-tex neochord placement x6 and 28 mm Sorin Memo 3D ring annuloplasty    Past Surgical History:  Procedure Laterality Date  . BENTALL PROCEDURE N/A 11/03/2016   Procedure: BENTALL PROCEDURE AORTIC ROOT REPLACEMENT + mechanical AV;  Surgeon: Rexene Alberts, MD;  Location: [place];  Service: Open Heart Surgery;  Laterality: N/A;  . CARDIAC CATHETERIZATION N/A 09/29/2016   Clean coronaries, EF 60-65%.  Procedure: Right/Left Heart Cath and Coronary Angiography;  Surgeon: Jettie Booze, MD;  Location: [place] [facility];  Service: Cardiovascular;  Laterality: N/A;  . CHOLECYSTECTOMY  1980  . COLONOSCOPY  2003 and 2013   Both normal.  . DIRECT LARYNGOSCOPY Left 10/17/2015   Procedure: MICRO DIRECT LARYNGOSCOPY WITH FROZEN SECTION;  Surgeon: Jodi Marble, MD;  Location: [place];  Service: ENT;  Laterality: Left;  . MITRAL VALVE REPAIR N/A 11/03/2016   Procedure: MITRAL VALVE REPAIR (MVR);  Surgeon: Rexene Alberts, MD;  Location: [place];  Service: Open Heart Surgery;  Laterality: N/A;  . PERIPHERAL VASCULAR CATHETERIZATION N/A 09/29/2016   Procedure: Abdominal Aortogram;  Surgeon: Jettie Booze, MD;  Location: [place] [facility];  Service: Cardiovascular;  Laterality: N/A;  . PERIPHERAL VASCULAR CATHETERIZATION N/A 09/29/2016   Procedure: Thoracic Aortogram;  Surgeon: Jettie Booze, MD;  Location: [place] [facility];  Service: Cardiovascular;  Laterality: N/A;  . TEE WITHOUT CARDIOVERSION N/A 09/17/2016   Procedure: TRANSESOPHAGEAL ECHOCARDIOGRAM (TEE);  Surgeon: Jerline Pain, MD;  Location: [place];  Service: Cardiovascular;  Laterality: N/A;  . TEE WITHOUT CARDIOVERSION N/A 11/03/2016   Procedure: TRANSESOPHAGEAL  ECHOCARDIOGRAM (TEE);  Surgeon: Rexene Alberts, MD;  Location: [place];  Service: Open Heart Surgery;  Laterality: N/A;  . TONSILECTOMY/ADENOIDECTOMY WITH MYRINGOTOMY  1960  . 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  Mod/severe AR w/functional bicuspid AV.  EF 45%.  . TRANSTHORACIC ECHOCARDIOGRAM  07/2015   Bicuspid aortic valve + mod mitral regurg  . TRANSTHORACIC ECHOCARDIOGRAM  02/01/2017   (Post aortic root/valve surgery): EF 35%, diffuse hypokinesis, LV dilatation, grade I DD. Cardiologist added ARB and increased his BB dose.  Lasix prn added 06/2017 due to some LE edema.  . TRANSTHORACIC ECHOCARDIOGRAM  05/2017   LVEF 45-50%.  Grd II DD.  Mechanical AV good, MV repair good.  . Vocal cord biopsy  09/2015   No atypia or inflammation.  Some fibrous changes that are possibly from hx of radiation therapy.    Family History  Problem Relation Age of Onset  . Heart disease Father   . Breast cancer Mother   . Breast cancer Sister   . Diabetes Brother   . Breast cancer Sister     Social History   Socioeconomic History  . Marital status: Widowed    Spouse name: Not on file  . Number of children: Not on file  . Years of education: Not on file  . Highest education level: Not on file  Occupational History  . Not on file  Social Needs  . Financial resource strain: Not on file  . Food insecurity    Worry: Not on file    Inability: Not on file  . Transportation needs    Medical: Not on file    Non-medical: Not on file  Tobacco Use  . Smoking status: Former Smoker    Quit date: 07/22/1979    Years since quitting: 40.2  . Smokeless tobacco: Never Used  Substance and Sexual Activity  . Alcohol use: Yes    Comment: rare  . Drug use: No  . Sexual activity: Not on file  Lifestyle  . Physical activity    Days per week: Not on file    Minutes per session: Not on file  . Stress: Not on file  Relationships  . Social Herbalist on phone:  Not on file    Gets together: Not on file    Attends religious service: Not on file    Active member of club or organization: Not on file    Attends meetings of clubs or organizations: Not on file    Relationship status: Not on file  . Intimate partner violence    Fear of current or ex partner: Not on file    Emotionally abused: Not on file    Physically abused: Not on file    Forced sexual activity: Not on file  Other Topics Concern  . Not on file  Social History Narrative   Married, one son deceased 2011/04/03.   Relocated from [place] to [facility] 06/2013 to be near grandchildren.   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   Smoker 20  pack-yr hx: quit about 1980.   Alcohol: 3 bottles of beer per week avg.     Drugs: none          Outpatient Medications Prior to Visit  Medication Sig Dispense Refill  . acetaminophen (TYLENOL) 500 MG tablet Take 500-1,000 mg by mouth every 6 (six) hours as needed for mild pain (depends on pain level if takes 500 mg or 1000 mg).     Marland Kitchen amoxicillin (AMOXIL) 500 MG tablet     . aspirin EC 81 MG EC tablet Take 1 tablet (81 mg total) by mouth daily.    . fenofibrate (TRICOR) 145 MG tablet Take 1 tablet (145 mg total) by mouth daily. 90 tablet 3  . losartan (COZAAR) 100 MG tablet Take 1 tablet (100 mg total) by mouth daily. Please keep upcoming appt with Dr. [name] for October for future refills. Thank you 90 tablet 0  . metoprolol succinate (TOPROL-XL) 50 MG 24 hr tablet Take 1 tablet (50 mg total) by mouth daily. 90 tablet 3  . Multiple Vitamins-Minerals (MULTIVITAMIN PO) Take 1 tablet by mouth daily.     . pantoprazole (PROTONIX) 40 MG tablet Take 1 tablet (40 mg total) by mouth daily. 90 tablet 1  . warfarin (COUMADIN) 5 MG tablet TAKE AS DIRECTED BY COUMADIN CLINIC 90 tablet 1  . furosemide (LASIX) 20 MG tablet Take 1 tablet (20 mg total) by mouth daily as needed for fluid or edema (2 or more pounds). (Patient not taking: Reported on 10/18/2019) 30 tablet 1   No  facility-administered medications prior to visit.     No Known Allergies  Review of Systems  Constitutional: Negative for appetite change, chills, fatigue and fever.  HENT: Negative for congestion, dental problem, ear pain and sore throat.   Eyes: Negative for discharge, redness and visual disturbance.  Respiratory: Negative for cough, chest tightness, shortness of breath and wheezing.   Cardiovascular: Negative for chest pain, palpitations and leg swelling.  Gastrointestinal: Negative for abdominal pain, blood in stool, diarrhea, nausea and vomiting.  Genitourinary: Negative for difficulty urinating, dysuria, flank pain, frequency, hematuria and urgency.  Musculoskeletal: Negative for arthralgias, back pain, joint swelling, myalgias and neck stiffness.  Skin: Negative for pallor and rash.  Neurological: Negative for dizziness, speech difficulty, weakness and headaches.  Hematological: Negative for adenopathy. Does not bruise/bleed easily.  Psychiatric/Behavioral: Negative for confusion and sleep disturbance. The patient is not nervous/anxious.     PE; Blood pressure 110/69, pulse (!) 54, temperature 98.2 F (36.8 C), temperature source Temporal, resp. rate 16, height 6' (1.829 m), weight 222 lb 9.6 oz (101 kg), SpO2 98 %. Body mass index is 30.19 kg/m.  Gen: Alert, well appearing.  Patient is oriented to person, place, time, and situation. AFFECT: pleasant, lucid thought and speech. ENT: Ears: EACs clear, normal epithelium.  TMs with good light reflex and landmarks bilaterally.  Eyes: no injection, icteris, swelling, or exudate.  EOMI, PERRLA. Nose: no drainage or turbinate edema/swelling.  No injection or focal lesion.  Mouth: lips without lesion/swelling.  Oral mucosa pink and moist.  Dentition intact and without obvious caries or gingival swelling.  Oropharynx without erythema, exudate, or swelling.  Neck: supple/nontender.  No LAD, mass, or TM.  Carotid pulses 2+ bilaterally,  without bruits. CV: RRR, no m/r/g.   LUNGS: CTA bilat, nonlabored resps, good aeration in all lung fields. ABD: soft, NT, ND, BS normal.  No hepatospenomegaly or mass.  No bruits. EXT: no clubbing or cyanosis.  2 +  pitting edema in LL's but not feet. Musculoskeletal: no joint swelling, erythema, warmth, or tenderness.  ROM of all joints intact. Skin - no sores or suspicious lesions or rashes or color changes   Pertinent labs:  Lab Results  Component Value Date   TSH 1.71 11/24/2016   Lab Results  Component Value Date   WBC 6.1 09/20/2018   HGB 14.5 09/20/2018   HCT 43.1 09/20/2018   MCV 85.6 09/20/2018   PLT 212.0 09/20/2018   Lab Results  Component Value Date   CREATININE 1.02 03/29/2019   BUN 15 03/29/2019   NA 141 03/29/2019   K 4.1 03/29/2019   CL 106 03/29/2019   CO2 29 03/29/2019   Lab Results  Component Value Date   ALT 23 09/20/2018   AST 25 09/20/2018   ALKPHOS 41 09/20/2018   BILITOT 0.6 09/20/2018   Lab Results  Component Value Date   CHOL 161 09/20/2018   Lab Results  Component Value Date   HDL 40.40 09/20/2018   Lab Results  Component Value Date   LDLCALC 100 (H) 09/20/2018   Lab Results  Component Value Date   TRIG 104.0 09/20/2018   Lab Results  Component Value Date   CHOLHDL 4 09/20/2018   Lab Results  Component Value Date   PSA 3.18ng/mL 03/29/2019   PSA 3.94 12/07/2018   PSA 3.32 09/20/2018   Lab Results  Component Value Date   HGBA1C 5.6 03/22/2018   ASSESSMENT AND PLAN:   Health maintenance exam: Reviewed age and gender appropriate health maintenance issues (prudent diet, regular exercise, health risks of tobacco and excessive alcohol, use of seatbelts, fire alarms in home, use of sunscreen).  Also reviewed age and gender appropriate health screening as well as vaccine recommendations. Vaccines: flu vaccine->he has had this.   Shingrix->has had #1 of 2. Labs: health panel + HbA1c (IFG) ordered->drawn today.  His PT/INR is  followed by his cardiologist. Prostate ca screening: getting followed by urol for hx of inc PSA velocity->PSA was 4 Oct 14, DRE normal per pt.  F/u planned 1 yr. Colon ca screening: recall 2023.  An After Visit Summary was printed and given to the patient.  FOLLOW UP:  Return in about 6 months (around 04/17/2020) for routine chronic illness f/u.  Signed:  Crissie Sickles, MD           10/18/2019

## 2019-10-18 NOTE — Patient Instructions (Signed)

## 2019-10-19 ENCOUNTER — Encounter: Payer: Self-pay | Admitting: Family Medicine

## 2019-10-19 LAB — HEMOGLOBIN A1C: Hgb A1c MFr Bld: 5.6 % (ref 4.6–6.5)

## 2019-10-19 LAB — TSH: TSH: 1.95 u[IU]/mL (ref 0.35–4.50)

## 2019-10-20 ENCOUNTER — Encounter: Payer: Self-pay | Admitting: Family Medicine

## 2019-10-31 ENCOUNTER — Encounter: Payer: Self-pay | Admitting: Family Medicine

## 2019-11-06 ENCOUNTER — Other Ambulatory Visit: Payer: Self-pay | Admitting: Cardiology

## 2019-11-07 ENCOUNTER — Ambulatory Visit (INDEPENDENT_AMBULATORY_CARE_PROVIDER_SITE_OTHER): Payer: PPO | Admitting: *Deleted

## 2019-11-07 ENCOUNTER — Other Ambulatory Visit: Payer: Self-pay

## 2019-11-07 DIAGNOSIS — Z954 Presence of other heart-valve replacement: Secondary | ICD-10-CM

## 2019-11-07 DIAGNOSIS — Z5181 Encounter for therapeutic drug level monitoring: Secondary | ICD-10-CM | POA: Diagnosis not present

## 2019-11-07 DIAGNOSIS — Z9889 Other specified postprocedural states: Secondary | ICD-10-CM | POA: Diagnosis not present

## 2019-11-07 DIAGNOSIS — I719 Aortic aneurysm of unspecified site, without rupture: Secondary | ICD-10-CM | POA: Diagnosis not present

## 2019-11-07 DIAGNOSIS — I7121 Aneurysm of the ascending aorta, without rupture: Secondary | ICD-10-CM

## 2019-11-07 LAB — POCT INR: INR: 1.8 — AB (ref 2.0–3.0)

## 2019-11-07 NOTE — Patient Instructions (Signed)
Description   Take an extra 1/2 tablet today, then start taking 1 tablet daily except 1/2 tablet on Monday, Wednesday, and Friday. Recheck INR in 3 weeks. Call Coumadin clinic with any questions [phone].

## 2019-11-28 ENCOUNTER — Ambulatory Visit (INDEPENDENT_AMBULATORY_CARE_PROVIDER_SITE_OTHER): Payer: PPO | Admitting: *Deleted

## 2019-11-28 ENCOUNTER — Other Ambulatory Visit: Payer: Self-pay

## 2019-11-28 DIAGNOSIS — Z5181 Encounter for therapeutic drug level monitoring: Secondary | ICD-10-CM

## 2019-11-28 DIAGNOSIS — I7121 Aneurysm of the ascending aorta, without rupture: Secondary | ICD-10-CM

## 2019-11-28 DIAGNOSIS — I719 Aortic aneurysm of unspecified site, without rupture: Secondary | ICD-10-CM | POA: Diagnosis not present

## 2019-11-28 DIAGNOSIS — Z954 Presence of other heart-valve replacement: Secondary | ICD-10-CM | POA: Diagnosis not present

## 2019-11-28 DIAGNOSIS — Z9889 Other specified postprocedural states: Secondary | ICD-10-CM

## 2019-11-28 LAB — POCT INR: INR: 2.3 (ref 2.0–3.0)

## 2019-11-28 NOTE — Patient Instructions (Signed)
Description   Continue taking 1 tablet daily except 1/2 tablet on Monday, Wednesday, and Friday. Recheck INR in 4 weeks. Call Coumadin clinic with any questions [phone].

## 2019-12-07 ENCOUNTER — Other Ambulatory Visit: Payer: Self-pay | Admitting: Family Medicine

## 2019-12-10 ENCOUNTER — Other Ambulatory Visit: Payer: Self-pay | Admitting: Cardiology

## 2019-12-12 ENCOUNTER — Other Ambulatory Visit: Payer: Self-pay | Admitting: Cardiology

## 2019-12-26 ENCOUNTER — Ambulatory Visit (INDEPENDENT_AMBULATORY_CARE_PROVIDER_SITE_OTHER): Payer: PPO | Admitting: *Deleted

## 2019-12-26 ENCOUNTER — Other Ambulatory Visit: Payer: Self-pay

## 2019-12-26 DIAGNOSIS — Z954 Presence of other heart-valve replacement: Secondary | ICD-10-CM

## 2019-12-26 DIAGNOSIS — Z5181 Encounter for therapeutic drug level monitoring: Secondary | ICD-10-CM

## 2019-12-26 DIAGNOSIS — Z9889 Other specified postprocedural states: Secondary | ICD-10-CM | POA: Diagnosis not present

## 2019-12-26 DIAGNOSIS — I7121 Aneurysm of the ascending aorta, without rupture: Secondary | ICD-10-CM

## 2019-12-26 DIAGNOSIS — I719 Aortic aneurysm of unspecified site, without rupture: Secondary | ICD-10-CM | POA: Diagnosis not present

## 2019-12-26 DIAGNOSIS — Q2543 Congenital aneurysm of aorta: Secondary | ICD-10-CM

## 2019-12-26 LAB — POCT INR: INR: 2.6 (ref 2.0–3.0)

## 2019-12-26 NOTE — Patient Instructions (Signed)
Description   Continue taking 1 tablet daily except 1/2 tablet on Monday, Wednesday, and Friday. Recheck INR in 4 weeks. [place] an extra serving of greens today.  Call Coumadin clinic with any questions [phone].

## 2020-01-23 ENCOUNTER — Ambulatory Visit (INDEPENDENT_AMBULATORY_CARE_PROVIDER_SITE_OTHER): Payer: PPO | Admitting: *Deleted

## 2020-01-23 ENCOUNTER — Other Ambulatory Visit: Payer: Self-pay

## 2020-01-23 DIAGNOSIS — Z5181 Encounter for therapeutic drug level monitoring: Secondary | ICD-10-CM | POA: Diagnosis not present

## 2020-01-23 DIAGNOSIS — Z9889 Other specified postprocedural states: Secondary | ICD-10-CM | POA: Diagnosis not present

## 2020-01-23 LAB — POCT INR: INR: 2 (ref 2.0–3.0)

## 2020-01-23 NOTE — Patient Instructions (Signed)
Description   Continue taking 1 tablet daily except 1/2 tablet on Monday, Wednesday, and Friday. Recheck INR in 5 weeks.   Call Coumadin clinic with any questions [phone].

## 2020-02-18 ENCOUNTER — Other Ambulatory Visit: Payer: Self-pay

## 2020-02-18 ENCOUNTER — Ambulatory Visit: Payer: PPO | Attending: Internal Medicine

## 2020-02-18 DIAGNOSIS — Z23 Encounter for immunization: Secondary | ICD-10-CM | POA: Insufficient documentation

## 2020-02-18 NOTE — Progress Notes (Signed)
   Covid-19 Vaccination Clinic  Name:  TOYE PAINTER    MRN: [id number] DOB: 1946/08/10  02/18/2020  Mr. Medearis was observed post Covid-19 immunization for 15 minutes without incidence. He was provided with Vaccine Information Sheet and instruction to access the V-Safe system.   Mr. Sneden was instructed to call 911 with any severe reactions post vaccine: Marland Kitchen Difficulty breathing  . Swelling of your face and throat  . A fast heartbeat  . A bad rash all over your body  . Dizziness and weakness    Immunizations Administered    Name Date Dose VIS Date Route   Pfizer COVID-19 Vaccine 02/18/2020  9:50 AM 0.3 mL 12/01/2019 Intramuscular   Manufacturer: [place]   Lot: [id number]   [place]: [id number]

## 2020-02-27 ENCOUNTER — Ambulatory Visit (INDEPENDENT_AMBULATORY_CARE_PROVIDER_SITE_OTHER): Payer: PPO | Admitting: *Deleted

## 2020-02-27 ENCOUNTER — Other Ambulatory Visit: Payer: Self-pay

## 2020-02-27 DIAGNOSIS — Z9889 Other specified postprocedural states: Secondary | ICD-10-CM | POA: Diagnosis not present

## 2020-02-27 DIAGNOSIS — Z5181 Encounter for therapeutic drug level monitoring: Secondary | ICD-10-CM | POA: Diagnosis not present

## 2020-02-27 LAB — POCT INR: INR: 2.2 (ref 2.0–3.0)

## 2020-02-27 NOTE — Patient Instructions (Signed)
Description   Continue taking 1 tablet daily except 1/2 tablet on Monday, Wednesday, and Friday. Recheck INR in 6 weeks.   Call Coumadin clinic with any questions [phone].

## 2020-02-28 ENCOUNTER — Other Ambulatory Visit: Payer: Self-pay

## 2020-03-04 ENCOUNTER — Ambulatory Visit: Payer: PPO

## 2020-03-04 ENCOUNTER — Other Ambulatory Visit: Payer: Self-pay

## 2020-03-04 ENCOUNTER — Ambulatory Visit (INDEPENDENT_AMBULATORY_CARE_PROVIDER_SITE_OTHER): Payer: PPO | Admitting: Family Medicine

## 2020-03-04 ENCOUNTER — Encounter: Payer: Self-pay | Admitting: Family Medicine

## 2020-03-04 VITALS — BP 111/68 | HR 54 | Temp 98.4°F | Resp 16 | Ht 72.0 in | Wt 227.6 lb

## 2020-03-04 DIAGNOSIS — Z87891 Personal history of nicotine dependence: Secondary | ICD-10-CM | POA: Diagnosis not present

## 2020-03-04 DIAGNOSIS — Z136 Encounter for screening for cardiovascular disorders: Secondary | ICD-10-CM

## 2020-03-04 DIAGNOSIS — I420 Dilated cardiomyopathy: Secondary | ICD-10-CM | POA: Insufficient documentation

## 2020-03-04 DIAGNOSIS — Z0000 Encounter for general adult medical examination without abnormal findings: Secondary | ICD-10-CM

## 2020-03-04 DIAGNOSIS — I719 Aortic aneurysm of unspecified site, without rupture: Secondary | ICD-10-CM

## 2020-03-04 DIAGNOSIS — I7121 Aneurysm of the ascending aorta, without rupture: Secondary | ICD-10-CM

## 2020-03-04 NOTE — Progress Notes (Signed)
The patient is here for annual Medicare wellness examination and management of other chronic and acute problems. Other problems discussed today: none.  Past Medical History:  Diagnosis Date  . 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  . Basal cell carcinoma    nose  . Chronic hoarseness    secondary to hx of laryngeal surgery  . GERD (gastroesophageal reflux disease)   . Headache   . History of tobacco abuse   . Hypertriglyceridemia   . IFG (impaired fasting glucose) 2018   A1c 5.4-5.6.  . Increased prostate specific antigen (PSA) velocity 09/2016   saw Dr. Jeffie Pollock 11/18/16: plan is to repeat PSA 6 wks and if still same/up then plan TRUS+ bx.  PSA's declined on recheck x 2 and as of 03/10/17 was 2.5.  Recommended annual PSA's with me, f/u with urol prn.  PSA velocity inc 09/2018, repeat PSA 12/07/18 PSA trending up again->to urol->repeats Jan and Apr 2020 trending down, up into ULN range 09/2019->f/u PSA 6 mo  . 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  Marland Kitchen Lesion of vocal cord   . Mitral regurgitation    severe by TEE 08/2016:  MV repair done 11/03/16  . Mitral valve prolapse    MV repair 10/2016  . Nonischemic cardiomyopathy ([place]) 2018   dilated LV, systolic dysfunction: see echo results in Surgical hx section, 01/2017 and 05/2017.  [name] aortic root replacement with bileaflet mechanical prosthetic valve conduit 11/03/2016   Size 27/29 mm On-X bileaflet mechanical valve and synthetic root conduit (coumadin and ASA 81mg  as per cardiology rec's) with reimplantation of left main and right coronary arteries.  Per CV surg, as of 02/16/17, his INR goal is 1.5-2.5.  Marland Kitchen s/p Bentall procedure for aortic root aneurysm ([place]) 11/03/2016  . 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   Complex  valvuloplasty including artificial Gore-tex neochord placement x6 and 28 mm Sorin Memo 3D ring annuloplasty   Past Surgical History:  Procedure Laterality Date  . BENTALL PROCEDURE N/A 11/03/2016   Procedure: BENTALL PROCEDURE AORTIC ROOT REPLACEMENT + mechanical AV;  Surgeon: Rexene Alberts, MD;  Location: [place];  Service: Open Heart Surgery;  Laterality: N/A;  . CARDIAC CATHETERIZATION N/A 09/29/2016   Clean coronaries, EF 60-65%.  Procedure: Right/Left Heart Cath and Coronary Angiography;  Surgeon: Jettie Booze, MD;  Location: [place] [facility];  Service: Cardiovascular;  Laterality: N/A;  . CHOLECYSTECTOMY  1980  . COLONOSCOPY  2003 and 2013   Both normal.  . DIRECT LARYNGOSCOPY Left 10/17/2015   Procedure: MICRO DIRECT LARYNGOSCOPY WITH FROZEN SECTION;  Surgeon: Jodi Marble, MD;  Location: [place];  Service: ENT;  Laterality: Left;  . MITRAL VALVE REPAIR N/A 11/03/2016   Procedure: MITRAL VALVE REPAIR (MVR);  Surgeon: Rexene Alberts, MD;  Location: [place];  Service: Open Heart Surgery;  Laterality: N/A;  . PERIPHERAL VASCULAR CATHETERIZATION N/A 09/29/2016   Procedure: Abdominal Aortogram;  Surgeon: Jettie Booze, MD;  Location: [place] [facility];  Service: Cardiovascular;  Laterality: N/A;  . PERIPHERAL VASCULAR CATHETERIZATION N/A 09/29/2016   Procedure: Thoracic Aortogram;  Surgeon: Jettie Booze, MD;  Location: [place] [facility];  Service: Cardiovascular;  Laterality: N/A;  . TEE WITHOUT CARDIOVERSION N/A 09/17/2016   Procedure: TRANSESOPHAGEAL ECHOCARDIOGRAM (TEE);  Surgeon: Jerline Pain, MD;  Location: [place];  Service: Cardiovascular;  Laterality: N/A;  . TEE WITHOUT CARDIOVERSION N/A 11/03/2016   Procedure: TRANSESOPHAGEAL ECHOCARDIOGRAM (TEE);  Surgeon: Rexene Alberts, MD;  Location: [place];  Service: Open Heart Surgery;  Laterality: N/A;  . TONSILECTOMY/ADENOIDECTOMY WITH MYRINGOTOMY  1960  . TRANSESOPHAGEAL ECHOCARDIOGRAM  09/17/2016   Severe MR  with ruptured chordae.  Mod/severe AR w/functional bicuspid AV.  EF 45%.  . TRANSTHORACIC ECHOCARDIOGRAM  07/2015   Bicuspid aortic valve + mod mitral regurg  . TRANSTHORACIC ECHOCARDIOGRAM  02/01/2017   (Post aortic root/valve surgery): EF 35%, diffuse hypokinesis, LV dilatation, grade I DD. Cardiologist added ARB and increased his BB dose.  Lasix prn added 06/2017 due to some LE edema.  . TRANSTHORACIC ECHOCARDIOGRAM  05/2017   LVEF 45-50%.  Grd II DD.  Mechanical AV good, MV repair good.  . Vocal cord biopsy  09/2015   No atypia or inflammation.  Some fibrous changes that are possibly from hx of radiation therapy.     AWV DATA The risk factors are reflected in the social history.  The roster of all physicians providing medical care to patient is listed in the Snapshot section of the chart.  Activities of daily living:  The patient is 100% independent in all ADLs: dressing, toileting, feeding as well as independent mobility.  Home safety : The patient has smoke detectors in the home. They wear seatbelts. No firearms at home  firearms are present in the home, kept in a safe fashion). There is no violence in the home.   There is no risks for hepatitis, STDs or HIV. There is no history of blood transfusion. They have no travel history to infectious disease endemic areas of the world.  The patient has seen their dentist in the last six month. They have seen their eye doctor in the last year. They deny any hearing difficulty and have not had audiologic testing in the last year.  They do not  have excessive sun exposure. Discussed the need for sun protection: hats, long sleeves and use of sunscreen if there is significant sun exposure.   Diet: the importance of a healthy diet is discussed. They do have a healthy diet.    The patient does not have a regular exercise program.  He is building a car from scratch--33 hot rod..  The benefits of regular aerobic exercise were discussed.  Depression  screen: there are no signs or vegative symptoms of depression- irritability, change in appetite, anhedonia, sadness/tearfullness.  Depression screen PHQ 2/9 03/04/2020  Decreased Interest 0  Down, Depressed, Hopeless 0  PHQ - 2 Score 0    Fall Risk  03/04/2020 02/27/2019 02/21/2018 02/15/2017 12/16/2016  Falls in the past year? 0 0 No No No  Number falls in past yr: 0 - - - -  Injury with Fall? 0 - - - -  Risk for fall due to : - - - - Medication side effect  Follow up Falls evaluation completed - - - -     Cognitive assessment: the patient manages all their financial and personal affairs and is actively engaged. They could relate day,date,year and events; recalled 3/3 objects at 3 minutes; performed clock-face test normally.  Reviewed advance directives with pt today.  The following portions of the patient's history were reviewed and updated as appropriate: allergies, current medications, past family history, past medical history,  past surgical history, past social history  and problem list.  Vision, hearing,  body mass index were assessed and reviewed.   BP 111/68 (BP Location: Left Arm, Patient Position: Sitting, Cuff Size: Large)   Pulse (!) 54   Temp 98.4 F (36.9 C) (Temporal)   Resp 16   Ht 6' (1.829 m)   Wt 227 lb 9.6 oz (103.2 kg)   SpO2 99%   BMI 30.87 kg/m    Lab Results  Component Value Date   TSH 1.95 10/18/2019   Lab Results  Component Value Date   WBC 4.7 10/18/2019   HGB 14.4 10/18/2019   HCT 42.5 10/18/2019   MCV 86.4 10/18/2019   PLT 181.0 10/18/2019   Lab Results  Component Value Date   CREATININE 0.99 10/18/2019   BUN 16 10/18/2019   NA 141 10/18/2019   K 4.5 10/18/2019   CL 107 10/18/2019   CO2 30 10/18/2019   Lab Results  Component Value Date   ALT 19 10/18/2019   AST 27 10/18/2019   ALKPHOS 42 10/18/2019   BILITOT 0.7 10/18/2019   Lab Results  Component Value Date   CHOL 165 10/18/2019   Lab Results  Component Value Date   HDL  37.20 (L) 10/18/2019   Lab Results  Component Value Date   LDLCALC 106 (H) 10/18/2019   Lab Results  Component Value Date   TRIG 110.0 10/18/2019   Lab Results  Component Value Date   CHOLHDL 4 10/18/2019   Lab Results  Component Value Date   PSA 4.03 09/28/2019   PSA 3.18ng/mL 03/29/2019   PSA 3.94 12/07/2018   Lab Results  Component Value Date   HGBA1C 5.6 10/18/2019    During the course of the visit the patient was educated and counseled about appropriate screening and preventive services including :  Annual wellness visit : today diabetes screening: n/a->pt with IFG, gets at least q16mo fasting glucoses and approx annual A1c's. colorectal cancer screening: next colonoscopy due 2023. recommended immunizations (influenza, pneumococcal, Hep B): all approp vaccines UTD. Bone mass measurement: n/a Counseling to prevent tobacco use: n/a Depression screening: today and at each routine f/u visit and CPE. Glaucoma screening: via ye MD Hepatitis C virus screening: declined HIV virus screening: declined Lung cancer screening: low risk, pt does not qualify. Medical nutrition therapy: n/a Prostate cancer screening: hx of elevated PSA->next PSA due 04/03/20 with urol b/c of rise in most recent one 09/28/19. Screening mammography: n/a Screening pap tests, pelvic exam, and clinical breast exam: n/a Ultrasound screening for AAA: pt qualifies-->will order today.  A written plan of action regarding the above screening and preventative services was given to the patient today.  F/u: 6 mo RCI  Signed:  Crissie Sickles, MD           03/04/2020

## 2020-03-05 ENCOUNTER — Other Ambulatory Visit: Payer: Self-pay | Admitting: Cardiology

## 2020-03-12 ENCOUNTER — Ambulatory Visit (HOSPITAL_BASED_OUTPATIENT_CLINIC_OR_DEPARTMENT_OTHER)
Admission: RE | Admit: 2020-03-12 | Discharge: 2020-03-12 | Disposition: A | Discharge status: Home / Self Care | Payer: PPO | Source: Ambulatory Visit | Attending: Family Medicine | Admitting: Family Medicine

## 2020-03-12 ENCOUNTER — Other Ambulatory Visit: Payer: Self-pay

## 2020-03-12 DIAGNOSIS — Z136 Encounter for screening for cardiovascular disorders: Secondary | ICD-10-CM | POA: Insufficient documentation

## 2020-03-12 DIAGNOSIS — Z87891 Personal history of nicotine dependence: Secondary | ICD-10-CM

## 2020-03-12 NOTE — Progress Notes (Signed)
VAS US AORTA SCREENING   03/12/20 Kenneth Ryan

## 2020-03-19 ENCOUNTER — Ambulatory Visit: Payer: PPO | Attending: Internal Medicine

## 2020-03-19 DIAGNOSIS — Z23 Encounter for immunization: Secondary | ICD-10-CM

## 2020-03-19 NOTE — Progress Notes (Signed)
   Covid-19 Vaccination Clinic  Name:  Kenneth Ryan    MRN: [id number] DOB: 20-Mar-1946  03/19/2020  Mr. [name] was observed post Covid-19 immunization for 15 minutes without incident. He was provided with Vaccine Information Sheet and instruction to access the V-Safe system.   [name] was instructed to call 911 with any severe reactions post vaccine: Marland Kitchen Difficulty breathing  . Swelling of face and throat  . A fast heartbeat  . A bad rash all over body  . Dizziness and weakness   Immunizations Administered    Name Date Dose VIS Date Route   Pfizer COVID-19 Vaccine 03/19/2020  9:23 AM 0.3 mL 12/01/2019 Intramuscular   Manufacturer: [place]   Lot: [id number]   [place]: [id number]

## 2020-03-27 ENCOUNTER — Other Ambulatory Visit: Payer: Self-pay | Admitting: Cardiology

## 2020-03-27 DIAGNOSIS — R972 Elevated prostate specific antigen [PSA]: Secondary | ICD-10-CM | POA: Diagnosis not present

## 2020-03-27 LAB — PSA: PSA: 3.74

## 2020-04-03 DIAGNOSIS — N401 Enlarged prostate with lower urinary tract symptoms: Secondary | ICD-10-CM | POA: Diagnosis not present

## 2020-04-03 DIAGNOSIS — R972 Elevated prostate specific antigen [PSA]: Secondary | ICD-10-CM | POA: Diagnosis not present

## 2020-04-03 DIAGNOSIS — R351 Nocturia: Secondary | ICD-10-CM | POA: Diagnosis not present

## 2020-04-09 ENCOUNTER — Ambulatory Visit (INDEPENDENT_AMBULATORY_CARE_PROVIDER_SITE_OTHER): Payer: PPO | Admitting: *Deleted

## 2020-04-09 ENCOUNTER — Other Ambulatory Visit: Payer: Self-pay

## 2020-04-09 DIAGNOSIS — I719 Aortic aneurysm of unspecified site, without rupture: Secondary | ICD-10-CM

## 2020-04-09 DIAGNOSIS — Z5181 Encounter for therapeutic drug level monitoring: Secondary | ICD-10-CM | POA: Diagnosis not present

## 2020-04-09 DIAGNOSIS — Z954 Presence of other heart-valve replacement: Secondary | ICD-10-CM

## 2020-04-09 DIAGNOSIS — Z9889 Other specified postprocedural states: Secondary | ICD-10-CM

## 2020-04-09 DIAGNOSIS — I7121 Aneurysm of the ascending aorta, without rupture: Secondary | ICD-10-CM

## 2020-04-09 DIAGNOSIS — Q2543 Congenital aneurysm of aorta: Secondary | ICD-10-CM

## 2020-04-09 LAB — POCT INR: INR: 3.2 — AB (ref 2.0–3.0)

## 2020-04-09 NOTE — Patient Instructions (Addendum)
Description   Do not take any Warfarin tomorrow then continue taking 1 tablet daily except 1/2 tablet on Monday, Wednesday, and Friday. Recheck INR in 4 weeks (normally 6 weeks).  Call Coumadin clinic with any questions [phone].

## 2020-05-07 ENCOUNTER — Other Ambulatory Visit: Payer: Self-pay

## 2020-05-07 ENCOUNTER — Ambulatory Visit (INDEPENDENT_AMBULATORY_CARE_PROVIDER_SITE_OTHER): Payer: PPO | Admitting: *Deleted

## 2020-05-07 DIAGNOSIS — I719 Aortic aneurysm of unspecified site, without rupture: Secondary | ICD-10-CM

## 2020-05-07 DIAGNOSIS — Z5181 Encounter for therapeutic drug level monitoring: Secondary | ICD-10-CM | POA: Diagnosis not present

## 2020-05-07 DIAGNOSIS — Z954 Presence of other heart-valve replacement: Secondary | ICD-10-CM

## 2020-05-07 DIAGNOSIS — Z9889 Other specified postprocedural states: Secondary | ICD-10-CM | POA: Diagnosis not present

## 2020-05-07 DIAGNOSIS — I7121 Aneurysm of the ascending aorta, without rupture: Secondary | ICD-10-CM

## 2020-05-07 LAB — POCT INR: INR: 2.4 (ref 2.0–3.0)

## 2020-05-07 NOTE — Patient Instructions (Signed)
Description   °Continue taking Warfarin 1 tablet daily except 1/2 tablet on Monday, Wednesday, and Friday. Recheck INR in 6 weeks.  Call Coumadin clinic with any questions #[phone]. °  ° °

## 2020-05-30 ENCOUNTER — Other Ambulatory Visit: Payer: Self-pay

## 2020-05-30 MED ORDER — PANTOPRAZOLE SODIUM 40 MG PO TBEC: 40.0000 mg | DELAYED_RELEASE_TABLET | Freq: Every day | ORAL | 1 refills | Status: DC

## 2020-06-18 ENCOUNTER — Ambulatory Visit (INDEPENDENT_AMBULATORY_CARE_PROVIDER_SITE_OTHER): Payer: PPO | Admitting: *Deleted

## 2020-06-18 ENCOUNTER — Other Ambulatory Visit: Payer: Self-pay

## 2020-06-18 DIAGNOSIS — Z5181 Encounter for therapeutic drug level monitoring: Secondary | ICD-10-CM | POA: Diagnosis not present

## 2020-06-18 DIAGNOSIS — I719 Aortic aneurysm of unspecified site, without rupture: Secondary | ICD-10-CM | POA: Diagnosis not present

## 2020-06-18 DIAGNOSIS — Z954 Presence of other heart-valve replacement: Secondary | ICD-10-CM

## 2020-06-18 DIAGNOSIS — I7121 Aneurysm of the ascending aorta, without rupture: Secondary | ICD-10-CM

## 2020-06-18 DIAGNOSIS — Z9889 Other specified postprocedural states: Secondary | ICD-10-CM

## 2020-06-18 LAB — POCT INR: INR: 2.6 (ref 2.0–3.0)

## 2020-06-18 NOTE — Patient Instructions (Signed)
Description   Please have a serving of leafy veggies today then continue taking Warfarin 1 tablet daily except 1/2 tablet on Monday, Wednesday, and Friday. Recheck INR in 6 weeks.  Call Coumadin clinic with any questions [phone].

## 2020-07-30 ENCOUNTER — Other Ambulatory Visit: Payer: Self-pay

## 2020-07-30 ENCOUNTER — Ambulatory Visit (INDEPENDENT_AMBULATORY_CARE_PROVIDER_SITE_OTHER): Payer: PPO | Admitting: *Deleted

## 2020-07-30 DIAGNOSIS — Z5181 Encounter for therapeutic drug level monitoring: Secondary | ICD-10-CM | POA: Diagnosis not present

## 2020-07-30 DIAGNOSIS — Z9889 Other specified postprocedural states: Secondary | ICD-10-CM

## 2020-07-30 DIAGNOSIS — I719 Aortic aneurysm of unspecified site, without rupture: Secondary | ICD-10-CM

## 2020-07-30 DIAGNOSIS — Z954 Presence of other heart-valve replacement: Secondary | ICD-10-CM

## 2020-07-30 DIAGNOSIS — I7121 Aneurysm of the ascending aorta, without rupture: Secondary | ICD-10-CM

## 2020-07-30 LAB — POCT INR: INR: 1.9 — AB (ref 2.0–3.0)

## 2020-07-30 NOTE — Patient Instructions (Signed)
Description   Tomorrow take 1 tablet then continue taking Warfarin 1 tablet daily except 1/2 tablet on Monday, Wednesday, and Friday. Recheck INR in 6 weeks.  Call Coumadin clinic with any questions [phone].

## 2020-08-27 ENCOUNTER — Other Ambulatory Visit: Payer: Self-pay | Admitting: Cardiology

## 2020-09-10 ENCOUNTER — Other Ambulatory Visit: Payer: Self-pay

## 2020-09-10 ENCOUNTER — Ambulatory Visit (INDEPENDENT_AMBULATORY_CARE_PROVIDER_SITE_OTHER): Payer: PPO | Admitting: Pharmacist

## 2020-09-10 DIAGNOSIS — Z954 Presence of other heart-valve replacement: Secondary | ICD-10-CM

## 2020-09-10 DIAGNOSIS — Z5181 Encounter for therapeutic drug level monitoring: Secondary | ICD-10-CM | POA: Diagnosis not present

## 2020-09-10 DIAGNOSIS — Z9889 Other specified postprocedural states: Secondary | ICD-10-CM

## 2020-09-10 DIAGNOSIS — Q2543 Congenital aneurysm of aorta: Secondary | ICD-10-CM

## 2020-09-10 DIAGNOSIS — I7121 Aneurysm of the ascending aorta, without rupture: Secondary | ICD-10-CM

## 2020-09-10 DIAGNOSIS — I719 Aortic aneurysm of unspecified site, without rupture: Secondary | ICD-10-CM | POA: Diagnosis not present

## 2020-09-10 LAB — POCT INR: INR: 2 (ref 2.0–3.0)

## 2020-09-10 NOTE — Patient Instructions (Signed)
Description   Continue taking Warfarin 1 tablet daily except 1/2 tablet on Monday, Wednesday, and Friday. Recheck INR in 6 weeks.  Call Coumadin clinic with any questions [phone].

## 2020-09-18 ENCOUNTER — Other Ambulatory Visit: Payer: Self-pay | Admitting: Cardiology

## 2020-09-25 DIAGNOSIS — R972 Elevated prostate specific antigen [PSA]: Secondary | ICD-10-CM | POA: Diagnosis not present

## 2020-09-25 LAB — PSA: PSA: 3.45

## 2020-09-30 DIAGNOSIS — H35342 Macular cyst, hole, or pseudohole, left eye: Secondary | ICD-10-CM | POA: Diagnosis not present

## 2020-10-02 DIAGNOSIS — R972 Elevated prostate specific antigen [PSA]: Secondary | ICD-10-CM | POA: Diagnosis not present

## 2020-10-02 DIAGNOSIS — R351 Nocturia: Secondary | ICD-10-CM | POA: Diagnosis not present

## 2020-10-02 DIAGNOSIS — N401 Enlarged prostate with lower urinary tract symptoms: Secondary | ICD-10-CM | POA: Diagnosis not present

## 2020-10-05 DIAGNOSIS — R972 Elevated prostate specific antigen [PSA]: Secondary | ICD-10-CM | POA: Diagnosis not present

## 2020-10-08 ENCOUNTER — Other Ambulatory Visit: Payer: Self-pay | Admitting: Cardiology

## 2020-10-09 ENCOUNTER — Encounter: Payer: Self-pay | Admitting: Family Medicine

## 2020-10-22 ENCOUNTER — Ambulatory Visit (INDEPENDENT_AMBULATORY_CARE_PROVIDER_SITE_OTHER): Payer: PPO

## 2020-10-22 ENCOUNTER — Other Ambulatory Visit: Payer: Self-pay

## 2020-10-22 DIAGNOSIS — Z5181 Encounter for therapeutic drug level monitoring: Secondary | ICD-10-CM | POA: Diagnosis not present

## 2020-10-22 DIAGNOSIS — Z954 Presence of other heart-valve replacement: Secondary | ICD-10-CM

## 2020-10-22 DIAGNOSIS — I719 Aortic aneurysm of unspecified site, without rupture: Secondary | ICD-10-CM

## 2020-10-22 DIAGNOSIS — I7121 Aneurysm of the ascending aorta, without rupture: Secondary | ICD-10-CM

## 2020-10-22 DIAGNOSIS — Z9889 Other specified postprocedural states: Secondary | ICD-10-CM | POA: Diagnosis not present

## 2020-10-22 LAB — POCT INR: INR: 2.3 (ref 2.0–3.0)

## 2020-10-22 NOTE — Patient Instructions (Signed)
Description   Continue taking Warfarin 1 tablet daily except 1/2 tablet on Mondays, Wednesdays, and Fridays. Recheck INR in 6 weeks. Call Coumadin clinic with any questions #[phone].    

## 2020-10-24 ENCOUNTER — Ambulatory Visit: Payer: PPO | Admitting: Family Medicine

## 2020-10-25 ENCOUNTER — Other Ambulatory Visit: Payer: Self-pay | Admitting: Cardiology

## 2020-11-04 ENCOUNTER — Ambulatory Visit (INDEPENDENT_AMBULATORY_CARE_PROVIDER_SITE_OTHER): Payer: PPO | Admitting: Family Medicine

## 2020-11-04 ENCOUNTER — Other Ambulatory Visit: Payer: Self-pay

## 2020-11-04 ENCOUNTER — Encounter: Payer: Self-pay | Admitting: Family Medicine

## 2020-11-04 VITALS — BP 100/62 | HR 55 | Temp 97.8°F | Resp 16 | Ht 71.0 in | Wt 216.4 lb

## 2020-11-04 DIAGNOSIS — Z0000 Encounter for general adult medical examination without abnormal findings: Secondary | ICD-10-CM

## 2020-11-04 DIAGNOSIS — Z1211 Encounter for screening for malignant neoplasm of colon: Secondary | ICD-10-CM

## 2020-11-04 DIAGNOSIS — Z7901 Long term (current) use of anticoagulants: Secondary | ICD-10-CM | POA: Diagnosis not present

## 2020-11-04 DIAGNOSIS — E781 Pure hyperglyceridemia: Secondary | ICD-10-CM

## 2020-11-04 DIAGNOSIS — R7301 Impaired fasting glucose: Secondary | ICD-10-CM | POA: Diagnosis not present

## 2020-11-04 DIAGNOSIS — I10 Essential (primary) hypertension: Secondary | ICD-10-CM

## 2020-11-04 LAB — HEMOGLOBIN A1C: Hgb A1c MFr Bld: 5.8 % (ref 4.6–6.5)

## 2020-11-04 LAB — CBC WITH DIFFERENTIAL/PLATELET
Basophils Absolute: 0 10*3/uL (ref 0.0–0.1)
Basophils Relative: 0.6 % (ref 0.0–3.0)
Eosinophils Absolute: 0.1 10*3/uL (ref 0.0–0.7)
Eosinophils Relative: 1.4 % (ref 0.0–5.0)
HCT: 43.7 % (ref 39.0–52.0)
Hemoglobin: 15 g/dL (ref 13.0–17.0)
Lymphocytes Relative: 37.6 % (ref 12.0–46.0)
Lymphs Abs: 2.1 10*3/uL (ref 0.7–4.0)
MCHC: 34.2 g/dL (ref 30.0–36.0)
MCV: 86 fl (ref 78.0–100.0)
Monocytes Absolute: 0.4 10*3/uL (ref 0.1–1.0)
Monocytes Relative: 6.8 % (ref 3.0–12.0)
Neutro Abs: 3 10*3/uL (ref 1.4–7.7)
Neutrophils Relative %: 53.6 % (ref 43.0–77.0)
Platelets: 202 10*3/uL (ref 150.0–400.0)
RBC: 5.08 Mil/uL (ref 4.22–5.81)
RDW: 14 % (ref 11.5–15.5)
WBC: 5.6 10*3/uL (ref 4.0–10.5)

## 2020-11-04 NOTE — Progress Notes (Signed)
Cardiology Office Note:    Date:  11/05/2020   ID:  Kenneth Ryan, DOB [birth year], MRN [id number]  PCP:  Tammi Sou, MD  [facility] Cardiologist:  No primary care provider on file.  [facility] Electrophysiologist:  None   Referring MD: Tammi Sou, MD    History of Present Illness:    Kenneth Ryan is a 74 y.o. male here for the follow-up of aortic root replacement status post Bentall procedure with bileaflet mechanical valve, synthetic root conduit and mitral valve repair on 11/03/2016 for bicuspid aortic valve.  Had severe aortic regurgitation at the time with dilated aortic root and mitral valve prolapse with severe mitral vegetation.  Dr. Roxy Manns operated.  No CAD prior to surgery.  EF postoperatively immediately 35% then improved to 45%.  Overall doing well.  Denies any fevers chills nausea vomiting syncope bleeding.  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   Basal cell carcinoma    nose   Chronic hoarseness    secondary to hx of laryngeal surgery   GERD (gastroesophageal reflux disease)    Headache    History of tobacco abuse    Hypertriglyceridemia    IFG (impaired fasting glucose) 2018   A1c 5.4-5.6.   Increased prostate specific antigen (PSA) velocity 09/2016   saw Dr. Jeffie Pollock 11/18/16: plan is to repeat PSA 6 wks and if still same/up then plan TRUS+ bx.  PSA's declined on recheck x 2 and as of 03/10/17 was 2.5.  Recommended annual PSA's with me, f/u with urol prn.  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   Lesion of vocal cord      Mitral regurgitation    severe by TEE 08/2016:  MV repair done 11/03/16   Mitral valve prolapse    MV repair 10/2016   Nonischemic cardiomyopathy ([place]) 2018   dilated LV, systolic dysfunction: see echo results in Surgical hx section, 01/2017 and 05/2017.   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  Per CV surg, as of 02/16/17, his INR goal is 1.5-2.5.   s/p Bentall procedure for aortic root aneurysm ([place]) 11/03/2016   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   Complex valvuloplasty including artificial Gore-tex neochord placement x6 and 28 mm Sorin Memo 3D ring annuloplasty    Past Surgical History:  Procedure Laterality Date   BENTALL PROCEDURE N/A 11/03/2016   Procedure: BENTALL PROCEDURE AORTIC ROOT REPLACEMENT + mechanical AV;  Surgeon: Rexene Alberts, MD;  Location: [place];  Service: Open Heart Surgery;  Laterality: N/A;   CARDIAC CATHETERIZATION N/A 09/29/2016   Clean coronaries, EF 60-65%.  Procedure: Right/Left Heart Cath and Coronary Angiography;  Surgeon: Jettie Booze, MD;  Location: [place] [facility];  Service: Cardiovascular;  Laterality: N/A;   CHOLECYSTECTOMY  1980   COLONOSCOPY  2003 and 2013   Both normal.   DIRECT LARYNGOSCOPY  Left 10/17/2015   Procedure: MICRO DIRECT LARYNGOSCOPY WITH FROZEN SECTION;  Surgeon: Jodi Marble, MD;  Location: [place];  Service: ENT;  Laterality: Left;   MITRAL VALVE REPAIR N/A 11/03/2016   Procedure: MITRAL VALVE REPAIR (MVR);  Surgeon: Rexene Alberts, MD;  Location: [place];  Service: Open Heart Surgery;  Laterality: N/A;   PERIPHERAL VASCULAR CATHETERIZATION N/A 09/29/2016   Procedure: Abdominal Aortogram;  Surgeon: Jettie Booze, MD;  Location: [place] [facility];  Service:  Cardiovascular;  Laterality: N/A;   PERIPHERAL VASCULAR CATHETERIZATION N/A 09/29/2016   Procedure: Thoracic Aortogram;  Surgeon: Jettie Booze, MD;  Location: [place] [facility];  Service: Cardiovascular;  Laterality: N/A;   TEE WITHOUT CARDIOVERSION N/A 09/17/2016   Procedure: TRANSESOPHAGEAL ECHOCARDIOGRAM (TEE);  Surgeon: Jerline Pain, MD;  Location: [place];  Service: Cardiovascular;  Laterality: N/A;   TEE WITHOUT CARDIOVERSION N/A 11/03/2016   Procedure: TRANSESOPHAGEAL ECHOCARDIOGRAM (TEE);  Surgeon: Rexene Alberts, MD;  Location: [place];  Service: Open Heart Surgery;  Laterality: N/A;   TONSILECTOMY/ADENOIDECTOMY WITH MYRINGOTOMY  1960   TRANSESOPHAGEAL ECHOCARDIOGRAM  09/17/2016   Severe MR with ruptured chordae.  Mod/severe AR w/functional bicuspid AV.  EF 45%.   TRANSTHORACIC ECHOCARDIOGRAM  07/2015   Bicuspid aortic valve + mod mitral regurg   TRANSTHORACIC ECHOCARDIOGRAM  02/01/2017   (Post aortic root/valve surgery): EF 35%, diffuse hypokinesis, LV dilatation, grade I DD. Cardiologist added ARB and increased his BB dose.  Lasix prn added 06/2017 due to some LE edema.   TRANSTHORACIC ECHOCARDIOGRAM  05/2017   LVEF 45-50%.  Grd II DD.  Mechanical AV good, MV repair good.   Vocal cord biopsy  09/2015   No atypia or inflammation.  Some fibrous changes that are possibly from hx of radiation therapy.    Current Medications: Current Meds  Medication Sig   acetaminophen (TYLENOL) 500 MG tablet Take 500-1,000 mg by mouth every 6 (six) hours as needed for mild pain (depends on pain level if takes 500 mg or 1000 mg).    amoxicillin (AMOXIL) 500 MG capsule TAKE 4 CAPSULES BY MOUTH 1 HOUR PRIOR TO APPOINTMENT   Ascorbic Acid (VITAMIN C GUMMIE PO) Take by mouth daily.   aspirin EC 81 MG EC tablet Take 1 tablet (81 mg total) by mouth daily.   fenofibrate (TRICOR) 145 MG tablet TAKE 1 TABLET BY MOUTH EVERY DAY   furosemide (LASIX) 20 MG tablet Take 1 tablet (20 mg  total) by mouth daily as needed for fluid or edema (2 or more pounds).   losartan (COZAAR) 100 MG tablet Take 1 tablet (100 mg total) by mouth daily.   metoprolol succinate (TOPROL-XL) 50 MG 24 hr tablet Take 1 tablet (50 mg total) by mouth daily.   Multiple Vitamins-Minerals (MULTIVITAMIN PO) Take 1 tablet by mouth daily.    pantoprazole (PROTONIX) 40 MG tablet Take 1 tablet (40 mg total) by mouth daily.   warfarin (COUMADIN) 5 MG tablet Take As Directed By Coumadin Clinic   [DISCONTINUED] losartan (COZAAR) 100 MG tablet TAKE 1 TABLET BY MOUTH EVERY DAY   [DISCONTINUED] metoprolol succinate (TOPROL-XL) 50 MG 24 hr tablet Take 1 tablet (50 mg total) by mouth daily. Please keep upcoming appointment in November for future refills. Thank you   [DISCONTINUED] warfarin (COUMADIN) 5 MG tablet TAKE AS DIRECTED BY COUMADIN CLINIC     Allergies:   Patient has no known allergies.   Social History   Socioeconomic History   Marital status: Widowed  Spouse name: Not on file   Number of children: Not on file   Years of education: Not on file   Highest education level: Not on file  Occupational History   Not on file  Tobacco Use   Smoking status: Former Smoker    Quit date: 07/22/1979    Years since quitting: 41.3   Smokeless tobacco: Never Used  Vaping Use   Vaping Use: Never used  Substance and Sexual Activity   Alcohol use: Yes    Comment: rare   Drug use: No   Sexual activity: Not on file  Other Topics Concern   Not on file  Social History Narrative   Widower (approx 2014-03-27), one son deceased 2011/03/28.   Relocated from [place] to [facility] 06/2013 to be near grandchildren.   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   Smoker 20 pack-yr hx: quit about 1980.   Alcohol: 3 bottles of beer per week avg.     Drugs: none         Social Determinants of Health   Financial Resource Strain:    Difficulty of Paying Living Expenses: Not on file  Food Insecurity:    Worried About Paediatric nurse in the Last Year: Not on file   [employer] of Food in the Last Year: Not on file  Transportation Needs:    Lack of Transportation (Medical): Not on file   Lack of Transportation (Non-Medical): Not on file  Physical Activity:    Days of Exercise per Week: Not on file   Minutes of Exercise per Session: Not on file  Stress:    Feeling of Stress : Not on file  Social Connections:    Frequency of Communication with Friends and Family: Not on file   Frequency of Social Gatherings with Friends and Family: Not on file   Attends Religious Services: Not on file   Active Member of Clubs or Organizations: Not on file   Attends Archivist Meetings: Not on file   Marital Status: Not on file     Family History: The patient's family history includes Breast cancer in his mother, sister, and sister; Diabetes in his brother; Heart disease in his father.  ROS:   Please see the history of present illness.     All other systems reviewed and are negative.  EKGs/Labs/Other Studies Reviewed:    The following studies were reviewed today:  ECHO 06/08/17:  - Left ventricle: The cavity size was normal. Systolic function was mildly reduced. The estimated ejection fraction was in the range of 45% to 50%. Features are consistent with a pseudonormal left ventricular filling pattern, with concomitant abnormal relaxation and increased filling pressure (grade 2 diastolic dysfunction). - Aortic valve: A mechanical prosthesis was present. - Mitral valve: Prior procedures included surgical repair.  EKG:  EKG is  ordered today.  The ekg ordered today demonstrates sinus rhythm 59 no other abnormalities. 10/13/2019-sinus bradycardia 53 no other significant abnormalities   Recent Labs: No results found for requested labs within last 8760 hours.  Recent Lipid Panel    Component Value Date/Time   CHOL 165 10/18/2019 1323   TRIG 110.0 10/18/2019 1323   HDL 37.20 (L)  10/18/2019 1323   CHOLHDL 4 10/18/2019 1323   VLDL 22.0 10/18/2019 1323   LDLCALC 106 (H) 10/18/2019 1323     Risk Assessment/Calculations:       Physical Exam:    VS:  BP 130/68    Pulse (!) 59  Ht 6' (1.829 m)    Wt 217 lb (98.4 kg)    SpO2 98%    BMI 29.43 kg/m     Wt Readings from Last 3 Encounters:  11/05/20 217 lb (98.4 kg)  11/04/20 216 lb 6.4 oz (98.2 kg)  03/04/20 227 lb 9.6 oz (103.2 kg)     GEN:  Well nourished, well developed in no acute distress HEENT: Normal NECK: No JVD; No carotid bruits LYMPHATICS: No lymphadenopathy CARDIAC: Sharp S2 RRR, no murmurs, rubs, gallops RESPIRATORY:  Clear to auscultation without rales, wheezing or rhonchi  ABDOMEN: Soft, non-tender, non-distended MUSCULOSKELETAL:  No edema; No deformity  SKIN: Warm and dry NEUROLOGIC:  Alert and oriented x 3 PSYCHIATRIC:  Normal affect   ASSESSMENT:    1. Dilated cardiomyopathy ([place])   2. S/P mitral valve repair   3. S/P aortic valve replacement with metallic valve   4. 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 Essential hypertension    PLAN:    In order of problems listed above:  Mechanical aortic valve  On-X bileaflet mechanical valve (INR 2-2.5), Bentall procedure, mitral valve repair 2017 -Dr. Roxy Manns.  Has mild ventral hernia noted at surgical site inferiorly.  Mitral valve repair -EF 45%.  Expected decrease postop.  Dental prophylaxis.  We will go ahead and check another echocardiogram since it has been several years.  Chronic anticoagulation -Has mechanical aortic valve.  INR 2-2.5.  Coumadin clinic care has been monitoring. -Prior creatinine 0.9 hemoglobin 13.9  Essential hypertension -Under excellent control.  No changes made.  Blood pressures at times have been slightly low, 035 systolic for instance.  He will check them at home.  If they are too low, we can always pull back on the losartan.  Dilated cardiomyopathy nonischemic cardiomyopathy -On both beta-blocker as well as  angiotensin receptor blocker.  Seem to be doing quite well on Lasix as needed.  EF 45%.    Shared Decision Making/Informed Consent        Medication Adjustments/Labs and Tests Ordered: Current medicines are reviewed at length with the patient today.  Concerns regarding medicines are outlined above.  Orders Placed This Encounter  Procedures   EKG 12-Lead   ECHOCARDIOGRAM COMPLETE   Meds ordered this encounter  Medications   metoprolol succinate (TOPROL-XL) 50 MG 24 hr tablet    Sig: Take 1 tablet (50 mg total) by mouth daily.    Dispense:  90 tablet    Refill:  3   losartan (COZAAR) 100 MG tablet    Sig: Take 1 tablet (100 mg total) by mouth daily.    Dispense:  90 tablet    Refill:  3   warfarin (COUMADIN) 5 MG tablet    Sig: Take As Directed By Coumadin Clinic    Dispense:  90 tablet    Refill:  0    3 month supply.    Patient Instructions  Medication Instructions:   Your physician recommends that you continue on your current medications as directed. Please refer to the Current Medication list given to you today.  *If you need a refill on your cardiac medications before your next appointment, please call your pharmacy*  Testing/Procedures:  Your physician has requested that you have an echocardiogram. Echocardiography is a painless test that uses sound waves to create images of your heart. It provides your doctor with information about the size and shape of your heart and how well your hearts chambers and valves are working. This procedure takes approximately one  hour. There are no restrictions for this procedure.  Follow-Up: At [facility], you and your health needs are our priority.  As part of our continuing mission to provide you with exceptional heart care, we have created designated Provider Care Teams.  These Care Teams include your primary Cardiologist (physician) and Advanced Practice Providers (APPs -  Physician Assistants and Nurse Practitioners) who  all work together to provide you with the care you need, when you need it.  We recommend signing up for the patient portal called "MyChart".  Sign up information is provided on this After Visit Summary.  MyChart is used to connect with patients for Virtual Visits (Telemedicine).  Patients are able to view lab/test results, encounter notes, upcoming appointments, etc.  Non-urgent messages can be sent to your provider as well.   To learn more about what you can do with MyChart, go to [email or web address].    Your next appointment:   1 year(s)  The format for your next appointment:   In Person  Provider:   Candee Furbish, MD   --DR. Shaquira Moroz WOULD LIKE FOR YOU TO KEEP AN EYE OUT ON YOUR BLOOD PRESSURE AND ITS READINGS, AND REPORT TO [place] ANY CONCERNING VALUES.      Signed, Candee Furbish, MD  11/05/2020 9:49 AM    [facility]

## 2020-11-04 NOTE — Patient Instructions (Signed)

## 2020-11-04 NOTE — Progress Notes (Signed)
Office Note 11/04/2020  CC:  Chief Complaint  Patient presents with  . Annual Exam    pt is fasting    HPI:  Kenneth Ryan is a 74 y.o. White male who is here for annual health maintenance exam and f/u  Hx of aortic valve replacement, aortic root aneurism repair, mitral valve repair, and is on chronic anticoag followed by coumadin clinic. Also, hx of HTN, hypertrig, and IFG.  No acute complaints. Has Dr. Marlou Ryan f/u tomorrow.  He very rarely has to take any furosemide. No home bp monitoring but he's compliant with all his meds. He does no formal exercise but is active, walks up and down his steep driveway to his Garage and has no CP or unusual SOB. Working on his old/classic cars.  Past Medical History:  Diagnosis Date  . 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  . Basal cell carcinoma    nose  . Chronic hoarseness    secondary to hx of laryngeal surgery  . GERD (gastroesophageal reflux disease)   . Headache   . History of tobacco abuse   . Hypertriglyceridemia   . IFG (impaired fasting glucose) 2018   A1c 5.4-5.6.  . Increased prostate specific antigen (PSA) velocity 09/2016   saw Dr. Jeffie Ryan 11/18/16: plan is to repeat PSA 6 wks and if still same/up then plan TRUS+ bx.  PSA's declined on recheck x 2 and as of 03/10/17 was 2.5.  Recommended annual PSA's with me, f/u with urol prn.  PSA velocity inc 09/2018, repeat PSA 12/07/18 PSA trending up again->to urol->repeats Jan and Apr 2020 trending down, up into ULN range 09/2019->f/u PSA 6 mo  . 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  Marland Kitchen Lesion of vocal cord   . Mitral regurgitation    severe by TEE 08/2016:  MV repair done 11/03/16  . Mitral valve prolapse    MV repair 10/2016  . Nonischemic cardiomyopathy ([place])  2018   dilated LV, systolic dysfunction: see echo results in Surgical hx section, 01/2017 and 05/2017.  Kenneth Ryan aortic root replacement with bileaflet mechanical prosthetic valve conduit 11/03/2016   Size 27/29 mm On-X bileaflet mechanical valve and synthetic root conduit (coumadin and ASA 81mg  as per cardiology rec's) with reimplantation of left main and right coronary arteries.  Per CV surg, as of 02/16/17, his INR goal is 1.5-2.5.  Marland Kitchen s/p Ryan procedure for aortic root aneurysm ([place]) 11/03/2016  . 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   Complex valvuloplasty including artificial Gore-tex neochord placement x6 and 28 mm Sorin Memo 3D ring annuloplasty    Past Surgical History:  Procedure Laterality Date  . Ryan PROCEDURE N/A 11/03/2016   Procedure: Ryan PROCEDURE AORTIC ROOT REPLACEMENT + mechanical AV;  Surgeon: Kenneth Alberts, MD;  Location: [place];  Service: Open Heart Surgery;  Laterality: N/A;  . CARDIAC CATHETERIZATION N/A 09/29/2016   Clean coronaries, EF 60-65%.  Procedure: Right/Left Heart Cath and Coronary Angiography;  Surgeon: Kenneth Booze, MD;  Location: [place] [facility];  Service: Cardiovascular;  Laterality: N/A;  . CHOLECYSTECTOMY  1980  . COLONOSCOPY  2003 and 2013   Both normal.  . DIRECT LARYNGOSCOPY Left 10/17/2015   Procedure: MICRO DIRECT LARYNGOSCOPY WITH FROZEN SECTION;  Surgeon: Kenneth Marble, MD;  Location:  [facility];  Service: ENT;  Laterality: Left;  . MITRAL VALVE REPAIR N/A 11/03/2016   Procedure: MITRAL VALVE REPAIR (MVR);  Surgeon: Kenneth Alberts, MD;  Location: [place];  Service: Open Heart Surgery;  Laterality: N/A;  . PERIPHERAL VASCULAR CATHETERIZATION N/A 09/29/2016   Procedure: Abdominal Aortogram;  Surgeon: Kenneth Booze, MD;  Location: [place] [facility];  Service: Cardiovascular;  Laterality: N/A;  . PERIPHERAL VASCULAR CATHETERIZATION N/A 09/29/2016   Procedure: Thoracic Aortogram;  Surgeon: Kenneth Booze, MD;  Location: [place] [facility];  Service: Cardiovascular;  Laterality: N/A;  . TEE WITHOUT CARDIOVERSION N/A 09/17/2016   Procedure: TRANSESOPHAGEAL ECHOCARDIOGRAM (TEE);  Surgeon: Kenneth Pain, MD;  Location: [place];  Service: Cardiovascular;  Laterality: N/A;  . TEE WITHOUT CARDIOVERSION N/A 11/03/2016   Procedure: TRANSESOPHAGEAL ECHOCARDIOGRAM (TEE);  Surgeon: Kenneth Alberts, MD;  Location: [place];  Service: Open Heart Surgery;  Laterality: N/A;  . TONSILECTOMY/ADENOIDECTOMY WITH MYRINGOTOMY  1960  . 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  Mod/severe AR w/functional bicuspid AV.  EF 45%.  . TRANSTHORACIC ECHOCARDIOGRAM  07/2015   Bicuspid aortic valve + mod mitral regurg  . TRANSTHORACIC ECHOCARDIOGRAM  02/01/2017   (Post aortic root/valve surgery): EF 35%, diffuse hypokinesis, LV dilatation, grade I DD. Cardiologist added ARB and increased his BB dose.  Lasix prn added 06/2017 due to some LE edema.  . TRANSTHORACIC ECHOCARDIOGRAM  05/2017   LVEF 45-50%.  Grd II DD.  Mechanical AV good, MV repair good.  . Vocal cord biopsy  09/2015   No atypia or inflammation.  Some fibrous changes that are possibly from hx of radiation therapy.    Family History  Problem Relation Age of Onset  . Heart disease Father   . Breast cancer Mother   . Breast cancer Sister   . Diabetes Brother   . Breast cancer Sister     Social History   Socioeconomic History  . Marital status: Widowed    Spouse name: Not on file  . Number of children: Not on file  . Years of education: Not on file  . Highest education level: Not on file  Occupational History  . Not on file  Tobacco Use  . Smoking status: Former Smoker    Quit date: 07/22/1979    Years since quitting: 41.3  . Smokeless tobacco: Never Used  Vaping Use  . Vaping Use: Never used  Substance and Sexual Activity  . Alcohol use: Yes    Comment: rare  . Drug use: No  . Sexual activity: Not on file  Other Topics Concern   . Not on file  Social History Narrative   Widower (approx 2014-04-04), one son deceased Apr 05, 2011.   Relocated from [place] to [facility] 06/2013 to be near grandchildren.   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   Smoker 20 pack-yr hx: quit about 1980.   Alcohol: 3 bottles of beer per week avg.     Drugs: none         Social Determinants of Health   Financial Resource Strain:   . Difficulty of Paying Living Expenses: Not on file  Food Insecurity:   . Worried About Charity fundraiser in the Last Year: Not on file  . Ran Out of Food in the Last Year: Not on file  Transportation Needs:   . Lack of Transportation (Medical): Not on file  . Lack of Transportation (Non-Medical): Not on file  Physical Activity:   .  Days of Exercise per Week: Not on file  . Minutes of Exercise per Session: Not on file  Stress:   . Feeling of Stress : Not on file  Social Connections:   . Frequency of Communication with Friends and Family: Not on file  . Frequency of Social Gatherings with Friends and Family: Not on file  . Attends Religious Services: Not on file  . Active Member of Clubs or Organizations: Not on file  . Attends Archivist Meetings: Not on file  . Marital Status: Not on file  Intimate Partner Violence:   . Fear of Current or Ex-Partner: Not on file  . Emotionally Abused: Not on file  . Physically Abused: Not on file  . Sexually Abused: Not on file    Outpatient Medications Prior to Visit  Medication Sig Dispense Refill  . Ascorbic Acid (VITAMIN C GUMMIE PO) Take by mouth daily.    Marland Kitchen aspirin EC 81 MG EC tablet Take 1 tablet (81 mg total) by mouth daily.    . fenofibrate (TRICOR) 145 MG tablet TAKE 1 TABLET BY MOUTH EVERY DAY 60 tablet 0  . furosemide (LASIX) 20 MG tablet Take 1 tablet (20 mg total) by mouth daily as needed for fluid or edema (2 or more pounds). 30 tablet 1  . losartan (COZAAR) 100 MG tablet TAKE 1 TABLET BY MOUTH EVERY DAY 90 tablet 1  . metoprolol succinate (TOPROL-XL) 50 MG  24 hr tablet Take 1 tablet (50 mg total) by mouth daily. Please keep upcoming appointment in November for future refills. Thank you 90 tablet 0  . Multiple Vitamins-Minerals (MULTIVITAMIN PO) Take 1 tablet by mouth daily.     . pantoprazole (PROTONIX) 40 MG tablet Take 1 tablet (40 mg total) by mouth daily. 90 tablet 1  . warfarin (COUMADIN) 5 MG tablet TAKE AS DIRECTED BY COUMADIN CLINIC 90 tablet 0  . acetaminophen (TYLENOL) 500 MG tablet Take 500-1,000 mg by mouth every 6 (six) hours as needed for mild Ryan (depends on Ryan level if takes 500 mg or 1000 mg).  (Patient not taking: Reported on 11/04/2020)    . amoxicillin (AMOXIL) 500 MG capsule TAKE 4 CAPSULES BY MOUTH 1 HOUR PRIOR TO APPOINTMENT (Patient not taking: Reported on 11/04/2020)     No facility-administered medications prior to visit.    No Known Allergies  ROS Review of Systems  Constitutional: Negative for appetite change, chills, fatigue and fever.  HENT: Negative for congestion, dental problem, ear Ryan and sore throat.   Eyes: Negative for discharge, redness and visual disturbance.  Respiratory: Negative for cough, chest tightness, shortness of breath and wheezing.   Cardiovascular: Negative for chest Ryan, palpitations and leg swelling.  Gastrointestinal: Negative for abdominal Ryan, blood in stool, diarrhea, nausea and vomiting.  Genitourinary: Negative for difficulty urinating, dysuria, flank Ryan, frequency, hematuria and urgency.  Musculoskeletal: Negative for arthralgias, back Ryan, joint swelling, myalgias and neck stiffness.  Skin: Negative for pallor and rash.  Neurological: Negative for dizziness, speech difficulty, weakness and headaches.  Hematological: Negative for adenopathy. Does not bruise/bleed easily.  Psychiatric/Behavioral: Negative for confusion and sleep disturbance. The patient is not nervous/anxious.     PE; Vitals with BMI 11/04/2020 03/04/2020 10/18/2019  Height 5\' 11"  6\' 0"  6\' 0"   Weight 216  lbs 6 oz 227 lbs 10 oz 222 lbs 10 oz  BMI 30.19 60.73 71.06  Systolic 269 485 462  Diastolic 62 68 69  Pulse 55 54 54   Gen: Alert,  well appearing.  Patient is oriented to person, place, time, and situation. AFFECT: pleasant, lucid thought and speech. ENT: Ears: EACs clear, normal epithelium.  TMs with good light reflex and landmarks bilaterally.  Eyes: no injection, icteris, swelling, or exudate.  EOMI, PERRLA. Nose: no drainage or turbinate edema/swelling.  No injection or focal lesion.  Mouth: lips without lesion/swelling.  Oral mucosa pink and moist.  Dentition intact and without obvious caries or gingival swelling.  Oropharynx without erythema, exudate, or swelling.  Neck: supple/nontender.  No LAD, mass, or TM.  Carotid pulses 2+ bilaterally, without bruits. CV: RRR, S1 normal, mechanical S2, no murmur/r/g.   LUNGS: CTA bilat, nonlabored resps, good aeration in all lung fields. ABD: soft, NT, ND, BS normal.  No hepatospenomegaly or mass.  No bruits. EXT: no clubbing, cyanosis, or edema.  Musculoskeletal: no joint swelling, erythema, warmth, or tenderness.  ROM of all joints intact. Skin - no sores or suspicious lesions or rashes or color changes   Pertinent labs:  Lab Results  Component Value Date   TSH 1.95 10/18/2019   Lab Results  Component Value Date   WBC 4.7 10/18/2019   HGB 14.4 10/18/2019   HCT 42.5 10/18/2019   MCV 86.4 10/18/2019   PLT 181.0 10/18/2019   Lab Results  Component Value Date   CREATININE 0.99 10/18/2019   BUN 16 10/18/2019   NA 141 10/18/2019   K 4.5 10/18/2019   CL 107 10/18/2019   CO2 30 10/18/2019   Lab Results  Component Value Date   ALT 19 10/18/2019   AST 27 10/18/2019   ALKPHOS 42 10/18/2019   BILITOT 0.7 10/18/2019   Lab Results  Component Value Date   CHOL 165 10/18/2019   Lab Results  Component Value Date   HDL 37.20 (L) 10/18/2019   Lab Results  Component Value Date   LDLCALC 106 (H) 10/18/2019   Lab Results   Component Value Date   TRIG 110.0 10/18/2019   Lab Results  Component Value Date   CHOLHDL 4 10/18/2019   Lab Results  Component Value Date   PSA 3.45 09/25/2020   PSA 3.74 03/27/2020   PSA 4.03 09/28/2019   Lab Results  Component Value Date   HGBA1C 5.6 10/18/2019   ASSESSMENT AND PLAN:   No problem-specific Assessment & Plan notes found for this encounter.  1) HTN: good control. Lytes/cr today. Continue toprol xl 50mg  qd and losartan 100mg  qd.  2) Hypertriglyceridemia: tolerating/compliant with fenofibrate 145mg  qd. Continue this. FLP and hepatic panel today.  3) IFG: diet is fair. Fasting gluc and Hba1c today.  4) Health maintenance exam: Reviewed age and gender appropriate health maintenance issues (prudent diet, regular exercise, health risks of tobacco and excessive alcohol, use of seatbelts, fire alarms in home, use of sunscreen).  Also reviewed age and gender appropriate health screening as well as vaccine recommendations. Vaccines:All UTD including flu and covid 19. Labs: fasting HP + A1c ordered. PT/INR per coumadin clinic. Prostate ca screening: followed by urol for hx of increased PSA velocity->most recent PSA was 09/25/20 and was good, 3.45. Colon ca screening: next colonoscopy due 2023.  5) Mechanical AV: PT/INR as per coumadin clinic. NO sign of bleeding. CBC monitoring today.  An After Visit Summary was printed and given to the patient.  FOLLOW UP:  Return in about 1 year (around 11/04/2021) for annual CPE (fasting) + [facility] f/u.  Signed:  Crissie Sickles, MD  11/04/2020  

## 2020-11-05 ENCOUNTER — Encounter: Payer: Self-pay | Admitting: Cardiology

## 2020-11-05 ENCOUNTER — Ambulatory Visit: Payer: PPO | Admitting: Cardiology

## 2020-11-05 VITALS — BP 130/68 | HR 59 | Ht 72.0 in | Wt 217.0 lb

## 2020-11-05 DIAGNOSIS — I10 Essential (primary) hypertension: Secondary | ICD-10-CM

## 2020-11-05 DIAGNOSIS — I7121 Aneurysm of the ascending aorta, without rupture: Secondary | ICD-10-CM

## 2020-11-05 DIAGNOSIS — Z9889 Other specified postprocedural states: Secondary | ICD-10-CM

## 2020-11-05 DIAGNOSIS — Q2543 Congenital aneurysm of aorta: Secondary | ICD-10-CM

## 2020-11-05 DIAGNOSIS — Z954 Presence of other heart-valve replacement: Secondary | ICD-10-CM | POA: Diagnosis not present

## 2020-11-05 DIAGNOSIS — I719 Aortic aneurysm of unspecified site, without rupture: Secondary | ICD-10-CM | POA: Diagnosis not present

## 2020-11-05 DIAGNOSIS — I420 Dilated cardiomyopathy: Secondary | ICD-10-CM | POA: Diagnosis not present

## 2020-11-05 LAB — COMPREHENSIVE METABOLIC PANEL
ALT: 17 U/L (ref 0–53)
AST: 25 U/L (ref 0–37)
Albumin: 4.1 g/dL (ref 3.5–5.2)
Alkaline Phosphatase: 43 U/L (ref 39–117)
BUN: 13 mg/dL (ref 6–23)
CO2: 29 mEq/L (ref 19–32)
Calcium: 9.4 mg/dL (ref 8.4–10.5)
Chloride: 104 mEq/L (ref 96–112)
Creatinine, Ser: 0.96 mg/dL (ref 0.40–1.50)
GFR: 78.98 mL/min (ref >60.00)
Glucose, Bld: 90 mg/dL (ref 70–99)
Potassium: 4.5 mEq/L (ref 3.5–5.1)
Sodium: 138 mEq/L (ref 135–145)
Total Bilirubin: 0.7 mg/dL (ref 0.2–1.2)
Total Protein: 6.7 g/dL (ref 6.0–8.3)

## 2020-11-05 LAB — LIPID PANEL
Cholesterol: 159 mg/dL (ref 0–200)
HDL: 40.2 mg/dL (ref >39.00)
LDL Cholesterol: 89 mg/dL (ref 0–99)
NonHDL: 118.86
Total CHOL/HDL Ratio: 4
Triglycerides: 149 mg/dL (ref 0.0–149.0)
VLDL: 29.8 mg/dL (ref 0.0–40.0)

## 2020-11-05 LAB — TSH: TSH: 3.03 u[IU]/mL (ref 0.35–4.50)

## 2020-11-05 MED ORDER — WARFARIN SODIUM 5 MG PO TABS: ORAL_TABLET | ORAL | 0 refills | Status: DC

## 2020-11-05 MED ORDER — METOPROLOL SUCCINATE ER 50 MG PO TB24: 50.0000 mg | ORAL_TABLET | Freq: Every day | ORAL | 3 refills | Status: DC

## 2020-11-05 MED ORDER — LOSARTAN POTASSIUM 100 MG PO TABS: 100.0000 mg | ORAL_TABLET | Freq: Every day | ORAL | 3 refills | Status: DC

## 2020-11-05 NOTE — Patient Instructions (Signed)
Medication Instructions:   Your physician recommends that you continue on your current medications as directed. Please refer to the Current Medication list given to you today.  *If you need a refill on your cardiac medications before your next appointment, please call your pharmacy*  Testing/Procedures:  Your physician has requested that you have an echocardiogram. Echocardiography is a painless test that uses sound waves to create images of your heart. It provides your doctor with information about the size and shape of your heart and how well your heart's chambers and valves are working. This procedure takes approximately one hour. There are no restrictions for this procedure.  Follow-Up: At [facility], you and your health needs are our priority.  As part of our continuing mission to provide you with exceptional heart care, we have created designated Provider Care Teams.  These Care Teams include your primary Cardiologist (physician) and Advanced Practice Providers (APPs -  Physician Assistants and Nurse Practitioners) who all work together to provide you with the care you need, when you need it.  We recommend signing up for the patient portal called "MyChart".  Sign up information is provided on this After Visit Summary.  MyChart is used to connect with patients for Virtual Visits (Telemedicine).  Patients are able to view lab/test results, encounter notes, upcoming appointments, etc.  Non-urgent messages can be sent to your provider as well.   To learn more about what you can do with MyChart, go to [email or web address].    Your next appointment:   1 year(s)  The format for your next appointment:   In Person  Provider:   Candee Furbish, MD   --DR. SKAINS WOULD LIKE FOR YOU TO KEEP AN EYE OUT ON YOUR BLOOD PRESSURE AND ITS READINGS, AND REPORT TO [place] ANY CONCERNING VALUES.

## 2020-11-07 ENCOUNTER — Encounter: Payer: Self-pay | Admitting: Family Medicine

## 2020-11-20 ENCOUNTER — Other Ambulatory Visit: Payer: Self-pay | Admitting: Family Medicine

## 2020-11-28 ENCOUNTER — Other Ambulatory Visit: Payer: Self-pay

## 2020-11-28 ENCOUNTER — Ambulatory Visit (HOSPITAL_COMMUNITY): Payer: PPO | Attending: Cardiology

## 2020-11-28 DIAGNOSIS — Z954 Presence of other heart-valve replacement: Secondary | ICD-10-CM | POA: Diagnosis not present

## 2020-11-28 DIAGNOSIS — Z9889 Other specified postprocedural states: Secondary | ICD-10-CM | POA: Insufficient documentation

## 2020-11-28 LAB — ECHOCARDIOGRAM COMPLETE
AV Mean grad: 5.6 mmHg
AV Peak grad: 10 mmHg
Ao pk vel: 1.58 m/s
Area-P 1/2: 1.61 cm2
S' Lateral: 3.8 cm

## 2020-12-01 ENCOUNTER — Other Ambulatory Visit: Payer: Self-pay | Admitting: Cardiology

## 2020-12-03 ENCOUNTER — Other Ambulatory Visit: Payer: Self-pay

## 2020-12-03 ENCOUNTER — Ambulatory Visit (INDEPENDENT_AMBULATORY_CARE_PROVIDER_SITE_OTHER): Payer: PPO

## 2020-12-03 DIAGNOSIS — I719 Aortic aneurysm of unspecified site, without rupture: Secondary | ICD-10-CM

## 2020-12-03 DIAGNOSIS — Q2543 Congenital aneurysm of aorta: Secondary | ICD-10-CM

## 2020-12-03 DIAGNOSIS — Z5181 Encounter for therapeutic drug level monitoring: Secondary | ICD-10-CM | POA: Diagnosis not present

## 2020-12-03 DIAGNOSIS — Z954 Presence of other heart-valve replacement: Secondary | ICD-10-CM | POA: Diagnosis not present

## 2020-12-03 DIAGNOSIS — Z9889 Other specified postprocedural states: Secondary | ICD-10-CM | POA: Diagnosis not present

## 2020-12-03 DIAGNOSIS — I7121 Aneurysm of the ascending aorta, without rupture: Secondary | ICD-10-CM

## 2020-12-03 LAB — POCT INR: INR: 2.7 (ref 2.0–3.0)

## 2020-12-03 NOTE — Patient Instructions (Signed)
Description   Take 1/2 tablet x 2 dosages, then resume same dosage 1 tablet daily except 1/2 tablet on Mondays, Wednesdays, and Fridays. Recheck INR in 6 weeks.  Call Coumadin clinic with any questions [phone].

## 2020-12-21 DIAGNOSIS — K625 Hemorrhage of anus and rectum: Secondary | ICD-10-CM

## 2020-12-21 HISTORY — DX: Hemorrhage of anus and rectum: K62.5

## 2021-01-15 DIAGNOSIS — S239XXA Sprain of unspecified parts of thorax, initial encounter: Secondary | ICD-10-CM | POA: Diagnosis not present

## 2021-01-23 ENCOUNTER — Other Ambulatory Visit: Payer: Self-pay

## 2021-01-23 ENCOUNTER — Ambulatory Visit (INDEPENDENT_AMBULATORY_CARE_PROVIDER_SITE_OTHER): Payer: PPO | Admitting: *Deleted

## 2021-01-23 DIAGNOSIS — I719 Aortic aneurysm of unspecified site, without rupture: Secondary | ICD-10-CM

## 2021-01-23 DIAGNOSIS — Z9889 Other specified postprocedural states: Secondary | ICD-10-CM | POA: Diagnosis not present

## 2021-01-23 DIAGNOSIS — Z5181 Encounter for therapeutic drug level monitoring: Secondary | ICD-10-CM

## 2021-01-23 DIAGNOSIS — I7121 Aneurysm of the ascending aorta, without rupture: Secondary | ICD-10-CM

## 2021-01-23 DIAGNOSIS — Z954 Presence of other heart-valve replacement: Secondary | ICD-10-CM | POA: Diagnosis not present

## 2021-01-23 LAB — POCT INR: INR: 2.3 (ref 2.0–3.0)

## 2021-01-23 NOTE — Patient Instructions (Signed)
Description   Continue taking Warfarin 1 tablet daily except 1/2 tablet on Mondays, Wednesdays, and Fridays. Recheck INR in 6 weeks. Call Coumadin clinic with any questions #[phone].    

## 2021-02-22 ENCOUNTER — Other Ambulatory Visit: Payer: Self-pay | Admitting: Cardiology

## 2021-02-22 DIAGNOSIS — I10 Essential (primary) hypertension: Secondary | ICD-10-CM

## 2021-02-22 DIAGNOSIS — Z9889 Other specified postprocedural states: Secondary | ICD-10-CM

## 2021-02-22 DIAGNOSIS — I7121 Aneurysm of the ascending aorta, without rupture: Secondary | ICD-10-CM

## 2021-02-22 DIAGNOSIS — I719 Aortic aneurysm of unspecified site, without rupture: Secondary | ICD-10-CM

## 2021-02-22 DIAGNOSIS — I420 Dilated cardiomyopathy: Secondary | ICD-10-CM

## 2021-02-22 DIAGNOSIS — Z954 Presence of other heart-valve replacement: Secondary | ICD-10-CM

## 2021-02-24 ENCOUNTER — Other Ambulatory Visit: Payer: Self-pay | Admitting: Cardiology

## 2021-02-24 DIAGNOSIS — I10 Essential (primary) hypertension: Secondary | ICD-10-CM

## 2021-02-24 DIAGNOSIS — Z9889 Other specified postprocedural states: Secondary | ICD-10-CM

## 2021-02-24 DIAGNOSIS — I7121 Aneurysm of the ascending aorta, without rupture: Secondary | ICD-10-CM

## 2021-02-24 DIAGNOSIS — I420 Dilated cardiomyopathy: Secondary | ICD-10-CM

## 2021-02-24 DIAGNOSIS — Z954 Presence of other heart-valve replacement: Secondary | ICD-10-CM

## 2021-02-24 DIAGNOSIS — I719 Aortic aneurysm of unspecified site, without rupture: Secondary | ICD-10-CM

## 2021-02-26 NOTE — Telephone Encounter (Signed)
[facility] pharmacy is requesting an alternative for Losartan 100 mg tablet. Pharmacy is stating that this medication is on backorder, with no release date. 2nd request. Please address

## 2021-03-05 ENCOUNTER — Ambulatory Visit (INDEPENDENT_AMBULATORY_CARE_PROVIDER_SITE_OTHER): Payer: PPO

## 2021-03-05 ENCOUNTER — Other Ambulatory Visit: Payer: Self-pay

## 2021-03-05 VITALS — BP 122/76 | HR 70 | Temp 97.6°F | Resp 16 | Ht 71.0 in | Wt 222.4 lb

## 2021-03-05 DIAGNOSIS — Z0000 Encounter for general adult medical examination without abnormal findings: Secondary | ICD-10-CM | POA: Diagnosis not present

## 2021-03-05 NOTE — Patient Instructions (Signed)
Kenneth Ryan , Thank you for taking time to come for your Medicare Wellness Visit. I appreciate your ongoing commitment to your health goals. Please review the following plan we discussed and let me know if I can assist you in the future.   Screening recommendations/referrals: Colonoscopy: Completed 12/22/2011-Due 12/21/2021 Recommended yearly ophthalmology/optometry visit for glaucoma screening and checkup Recommended yearly dental visit for hygiene and checkup  Vaccinations: Influenza vaccine: Up to date Pneumococcal vaccine: Completed vaccines Tdap vaccine: Up to date-Due-08/27/2025 Shingles vaccine: Completed vaccines Covid-19: Completed vaccines  Advanced directives: Please bring a copy for your chart  Conditions/risks identified: See problem list  Next appointment: Follow up in one year for your annual wellness visit.   Preventive Care 73 Years and Older, Male Preventive care refers to lifestyle choices and visits with your health care provider that can promote health and wellness. What does preventive care include?  A yearly physical exam. This is also called an annual well check.  Dental exams once or twice a year.  Routine eye exams. Ask your health care provider how often you should have your eyes checked.  Personal lifestyle choices, including:  Daily care of your teeth and gums.  Regular physical activity.  Eating a healthy diet.  Avoiding tobacco and drug use.  Limiting alcohol use.  Practicing safe sex.  Taking low doses of aspirin every day.  Taking vitamin and mineral supplements as recommended by your health care provider. What happens during an annual well check? The services and screenings done by your health care provider during your annual well check will depend on your age, overall health, lifestyle risk factors, and family history of disease. Counseling  Your health care provider may ask you questions about your:  Alcohol use.  Tobacco  use.  Drug use.  Emotional well-being.  Home and relationship well-being.  Sexual activity.  Eating habits.  History of falls.  Memory and ability to understand (cognition).  Work and work Statistician. Screening  You may have the following tests or measurements:  Height, weight, and BMI.  Blood pressure.  Lipid and cholesterol levels. These may be checked every 5 years, or more frequently if you are over 73 years old.  Skin check.  Lung cancer screening. You may have this screening every year starting at age 73 if you have a 30-pack-year history of smoking and currently smoke or have quit within the past 15 years.  Fecal occult blood test (FOBT) of the stool. You may have this test every year starting at age 73.  Flexible sigmoidoscopy or colonoscopy. You may have a sigmoidoscopy every 5 years or a colonoscopy every 10 years starting at age 73.  Prostate cancer screening. Recommendations will vary depending on your family history and other risks.  Hepatitis C blood test.  Hepatitis B blood test.  Sexually transmitted disease (STD) testing.  Diabetes screening. This is done by checking your blood sugar (glucose) after you have not eaten for a while (fasting). You may have this done every 1-3 years.  Abdominal aortic aneurysm (AAA) screening. You may need this if you are a current or former smoker.  Osteoporosis. You may be screened starting at age 73 if you are at high risk. Talk with your health care provider about your test results, treatment options, and if necessary, the need for more tests. Vaccines  Your health care provider may recommend certain vaccines, such as:  Influenza vaccine. This is recommended every year.  Tetanus, diphtheria, and acellular pertussis (Tdap, Td) vaccine.  You may need a Td booster every 10 years.  Zoster vaccine. You may need this after age 29.  Pneumococcal 13-valent conjugate (PCV13) vaccine. One dose is recommended after age  37.  Pneumococcal polysaccharide (PPSV23) vaccine. One dose is recommended after age 102. Talk to your health care provider about which screenings and vaccines you need and how often you need them. This information is not intended to replace advice given to you by your health care provider. Make sure you discuss any questions you have with your health care provider. Document Released: 01/03/2016 Document Revised: 08/26/2016 Document Reviewed: 10/08/2015 Elsevier Interactive Patient Education  2017 [place] Prevention in the Home Falls can cause injuries. They can happen to people of all ages. There are many things you can do to make your home safe and to help prevent falls. What can I do on the outside of my home?  Regularly fix the edges of walkways and driveways and fix any cracks.  Remove anything that might make you trip as you walk through a door, such as a raised step or threshold.  Trim any bushes or trees on the path to your home.  Use bright outdoor lighting.  Clear any walking paths of anything that might make someone trip, such as rocks or tools.  Regularly check to see if handrails are loose or broken. Make sure that both sides of any steps have handrails.  Any raised decks and porches should have guardrails on the edges.  Have any leaves, snow, or ice cleared regularly.  Use sand or salt on walking paths during winter.  Clean up any spills in your garage right away. This includes oil or grease spills. What can I do in the bathroom?  Use night lights.  Install grab bars by the toilet and in the tub and shower. Do not use towel bars as grab bars.  Use non-skid mats or decals in the tub or shower.  If you need to sit down in the shower, use a plastic, non-slip stool.  Keep the floor dry. Clean up any water that spills on the floor as soon as it happens.  Remove soap buildup in the tub or shower regularly.  Attach bath mats securely with double-sided  non-slip rug tape.  Do not have throw rugs and other things on the floor that can make you trip. What can I do in the bedroom?  Use night lights.  Make sure that you have a light by your bed that is easy to reach.  Do not use any sheets or blankets that are too big for your bed. They should not hang down onto the floor.  Have a firm chair that has side arms. You can use this for support while you get dressed.  Do not have throw rugs and other things on the floor that can make you trip. What can I do in the kitchen?  Clean up any spills right away.  Avoid walking on wet floors.  Keep items that you use a lot in easy-to-reach places.  If you need to reach something above you, use a strong step stool that has a grab bar.  Keep electrical cords out of the way.  Do not use floor polish or wax that makes floors slippery. If you must use wax, use non-skid floor wax.  Do not have throw rugs and other things on the floor that can make you trip. What can I do with my stairs?  Do not leave any  items on the stairs.  Make sure that there are handrails on both sides of the stairs and use them. Fix handrails that are broken or loose. Make sure that handrails are as long as the stairways.  Check any carpeting to make sure that it is firmly attached to the stairs. Fix any carpet that is loose or worn.  Avoid having throw rugs at the top or bottom of the stairs. If you do have throw rugs, attach them to the floor with carpet tape.  Make sure that you have a light switch at the top of the stairs and the bottom of the stairs. If you do not have them, ask someone to add them for you. What else can I do to help prevent falls?  Wear shoes that:  Do not have high heels.  Have rubber bottoms.  Are comfortable and fit you well.  Are closed at the toe. Do not wear sandals.  If you use a stepladder:  Make sure that it is fully opened. Do not climb a closed stepladder.  Make sure that both  sides of the stepladder are locked into place.  Ask someone to hold it for you, if possible.  Clearly mark and make sure that you can see:  Any grab bars or handrails.  First and last steps.  Where the edge of each step is.  Use tools that help you move around (mobility aids) if they are needed. These include:  Canes.  Walkers.  Scooters.  Crutches.  Turn on the lights when you go into a dark area. Replace any light bulbs as soon as they burn out.  Set up your furniture so you have a clear path. Avoid moving your furniture around.  If any of your floors are uneven, fix them.  If there are any pets around you, be aware of where they are.  Review your medicines with your doctor. Some medicines can make you feel dizzy. This can increase your chance of falling. Ask your doctor what other things that you can do to help prevent falls. This information is not intended to replace advice given to you by your health care provider. Make sure you discuss any questions you have with your health care provider. Document Released: 10/03/2009 Document Revised: 05/14/2016 Document Reviewed: 01/11/2015 Elsevier Interactive Patient Education  2017 [employer].

## 2021-03-05 NOTE — Progress Notes (Signed)
Subjective:   Kenneth Ryan is a 75 y.o. male who presents for Medicare Annual/Subsequent preventive examination.  Review of Systems     Cardiac Risk Factors include: advanced age (>59men, >64 women);male gender;dyslipidemia;obesity (BMI >30kg/m2)     Objective:    Today's Vitals   03/05/21 0946  BP: 122/76  Pulse: 70  Resp: 16  Temp: 97.6 F (36.4 C)  TempSrc: Temporal  SpO2: 98%  Weight: 222 lb 6.4 oz (100.9 kg)  Height: 5\' 11"  (1.803 m)   Body mass index is 31.02 kg/m.  Advanced Directives 03/05/2021 03/04/2020 02/27/2019 02/21/2018 02/15/2017 12/23/2016 11/24/2016  Does Patient Have a Medical Advance Directive? Yes Yes No Yes Yes - Yes  Type of Paramedic of [place];Living will [place];Living will - [name];Living will Living will;Healthcare Power of [place]  Does patient want to make changes to medical advance directive? - - - - - Yes (Inpatient - patient defers changing a medical advance directive at this time) No - Patient declined  Copy of [place] in Chart? No - copy requested No - copy requested - No - copy requested No - copy requested - No - copy requested  Would patient like information on creating a medical advance directive? - - No - Patient declined - - - -    Current Medications (verified) Outpatient Encounter Medications as of 03/05/2021  Medication Sig  . acetaminophen (TYLENOL) 500 MG tablet Take 500-1,000 mg by mouth every 6 (six) hours as needed for mild pain (depends on pain level if takes 500 mg or 1000 mg).   Marland Kitchen amoxicillin (AMOXIL) 500 MG capsule TAKE 4 CAPSULES BY MOUTH 1 HOUR PRIOR TO APPOINTMENT  . Ascorbic Acid (VITAMIN C GUMMIE PO) Take by mouth daily.  Marland Kitchen aspirin EC 81 MG EC tablet Take 1 tablet (81 mg total) by mouth daily.  . fenofibrate (TRICOR) 145 MG tablet TAKE 1 TABLET BY MOUTH EVERY DAY  . furosemide (LASIX) 20 MG tablet  Take 1 tablet (20 mg total) by mouth daily as needed for fluid or edema (2 or more pounds).  . losartan (COZAAR) 100 MG tablet TAKE 1 TABLET BY MOUTH EVERY DAY  . metoprolol succinate (TOPROL-XL) 50 MG 24 hr tablet Take 1 tablet (50 mg total) by mouth daily.  . Multiple Vitamins-Minerals (MULTIVITAMIN PO) Take 1 tablet by mouth daily.   . pantoprazole (PROTONIX) 40 MG tablet TAKE 1 TABLET BY MOUTH EVERY DAY  . warfarin (COUMADIN) 5 MG tablet Take As Directed By Coumadin Clinic   No facility-administered encounter medications on file as of 03/05/2021.    Allergies (verified) Patient has no known allergies.   History: Past Medical History:  Diagnosis Date  . 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  . Basal cell carcinoma    nose  . Chronic hoarseness    secondary to hx of laryngeal surgery  . GERD (gastroesophageal reflux disease)   . Headache   . History of tobacco abuse   . Hypertriglyceridemia   . IFG (impaired fasting glucose) 2018   A1c 5.4-5.6. 5.8% Nov 2021  . Increased prostate specific antigen (PSA) velocity 09/2016   saw Dr. Jeffie Pollock 11/18/16: plan is to repeat PSA 6 wks and if still same/up then plan TRUS+ bx.  PSA's declined on recheck x 2 and as of  03/10/17 was 2.5.  Recommended annual PSA's with me, f/u with urol prn.  PSA velocity inc 09/2018, repeat PSA 12/07/18 PSA trending up again->to urol->repeats Jan and Apr 2020 trending down, up into ULN range 09/2019->f/u PSA 6 mo  . 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  Marland Kitchen Lesion of vocal cord   . Mitral regurgitation    severe by TEE 08/2016:  MV repair done 11/03/16  . Mitral valve prolapse    MV repair 10/2016  . Nonischemic cardiomyopathy ([place]) 2018   dilated LV, systolic dysfunction: see echo results in Surgical  hx section, 01/2017 and 05/2017.  [name] aortic root replacement with bileaflet mechanical prosthetic valve conduit 11/03/2016   Size 27/29 mm On-X bileaflet mechanical valve and synthetic root conduit (coumadin and ASA 81mg  as per cardiology rec's) with reimplantation of left main and right coronary arteries.  Per CV surg, as of 02/16/17, his INR goal is 1.5-2.5.  Marland Kitchen s/p Bentall procedure for aortic root aneurysm ([place]) 11/03/2016  . 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   Complex valvuloplasty including artificial Gore-tex neochord placement x6 and 28 mm Sorin Memo 3D ring annuloplasty   Past Surgical History:  Procedure Laterality Date  . BENTALL PROCEDURE N/A 11/03/2016   Procedure: BENTALL PROCEDURE AORTIC ROOT REPLACEMENT + mechanical AV;  Surgeon: Rexene Alberts, MD;  Location: [place];  Service: Open Heart Surgery;  Laterality: N/A;  . CARDIAC CATHETERIZATION N/A 09/29/2016   Clean coronaries, EF 60-65%.  Procedure: Right/Left Heart Cath and Coronary Angiography;  Surgeon: Jettie Booze, MD;  Location: [place] [facility];  Service: Cardiovascular;  Laterality: N/A;  . CHOLECYSTECTOMY  1980  . COLONOSCOPY  2003 and 2013   Both normal.  . DIRECT LARYNGOSCOPY Left 10/17/2015   Procedure: MICRO DIRECT LARYNGOSCOPY WITH FROZEN SECTION;  Surgeon: Jodi Marble, MD;  Location: [place];  Service: ENT;  Laterality: Left;  . MITRAL VALVE REPAIR N/A 11/03/2016   Procedure: MITRAL VALVE REPAIR (MVR);  Surgeon: Rexene Alberts, MD;  Location: [place];  Service: Open Heart Surgery;  Laterality: N/A;  . PERIPHERAL VASCULAR CATHETERIZATION N/A 09/29/2016   Procedure: Abdominal Aortogram;  Surgeon: Jettie Booze, MD;  Location: [place] [facility];  Service: Cardiovascular;  Laterality: N/A;  . PERIPHERAL VASCULAR CATHETERIZATION N/A 09/29/2016   Procedure: Thoracic Aortogram;  Surgeon: Jettie Booze, MD;  Location: [place] [facility];  Service: Cardiovascular;  Laterality: N/A;  . TEE  WITHOUT CARDIOVERSION N/A 09/17/2016   Procedure: TRANSESOPHAGEAL ECHOCARDIOGRAM (TEE);  Surgeon: Jerline Pain, MD;  Location: [place];  Service: Cardiovascular;  Laterality: N/A;  . TEE WITHOUT CARDIOVERSION N/A 11/03/2016   Procedure: TRANSESOPHAGEAL ECHOCARDIOGRAM (TEE);  Surgeon: Rexene Alberts, MD;  Location: [place];  Service: Open Heart Surgery;  Laterality: N/A;  . TONSILECTOMY/ADENOIDECTOMY WITH MYRINGOTOMY  1960  . 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  Mod/severe AR w/functional bicuspid AV.  EF 45%.  . TRANSTHORACIC ECHOCARDIOGRAM  07/2015   Bicuspid aortic valve + mod mitral regurg  . TRANSTHORACIC ECHOCARDIOGRAM  02/01/2017   (Post aortic root/valve surgery): EF 35%, diffuse hypokinesis, LV dilatation, grade I DD. Cardiologist added ARB and increased his BB dose.  Lasix prn added 06/2017 due to some LE edema.  . TRANSTHORACIC ECHOCARDIOGRAM  05/2017   LVEF 45-50%.  Grd II DD.  Mechanical AV good, MV repair  good.  . Vocal cord biopsy  09/2015   No atypia or inflammation.  Some fibrous changes that are possibly from hx of radiation therapy.   Family History  Problem Relation Age of Onset  . Heart disease Father   . Breast cancer Mother   . Breast cancer Sister   . Diabetes Brother   . Breast cancer Sister    Social History   Socioeconomic History  . Marital status: Widowed    Spouse name: Not on file  . Number of children: Not on file  . Years of education: Not on file  . Highest education level: Not on file  Occupational History  . Not on file  Tobacco Use  . Smoking status: Former Smoker    Quit date: 07/22/1979    Years since quitting: 41.6  . Smokeless tobacco: Never Used  Vaping Use  . Vaping Use: Never used  Substance and Sexual Activity  . Alcohol use: Yes    Comment: rare  . Drug use: No  . Sexual activity: Not on file  Other Topics Concern  . Not on file  Social History Narrative   Widower (approx 14-Mar-2014), one  son deceased March 15, 2011.   Relocated from [place] to [facility] 06/2013 to be near grandchildren.   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   Smoker 20 pack-yr hx: quit about 1980.   Alcohol: 3 bottles of beer per week avg.     Drugs: none         Social Determinants of Health   Financial Resource Strain: Low Risk   . Difficulty of Paying Living Expenses: Not hard at all  Food Insecurity: No Food Insecurity  . Worried About Charity fundraiser in the Last Year: Never true  . Ran Out of Food in the Last Year: Never true  Transportation Needs: No Transportation Needs  . Lack of Transportation (Medical): No  . Lack of Transportation (Non-Medical): No  Physical Activity: Inactive  . Days of Exercise per Week: 0 days  . Minutes of Exercise per Session: 0 min  Stress: No Stress Concern Present  . Feeling of Stress : Only a little  Social Connections: Moderately Isolated  . Frequency of Communication with Friends and Family: More than three times a week  . Frequency of Social Gatherings with Friends and Family: Once a week  . Attends Religious Services: More than 4 times per year  . Active Member of Clubs or Organizations: No  . Attends Archivist Meetings: Never  . Marital Status: Widowed    Tobacco Counseling Counseling given: Not Answered   Clinical Intake:  Pre-visit preparation completed: Yes  Pain : No/denies pain     Nutritional Status: BMI > 30  Obese Nutritional Risks: None Diabetes: No  How often do you need to have someone help you when you read instructions, pamphlets, or other written materials from your doctor or pharmacy?: 1 - Never  Diabetic?No  Interpreter Needed?: No  Information entered by :: Caroleen Hamman LPN   Activities of Daily Living In your present state of health, do you have any difficulty performing the following activities: 03/05/2021  Hearing? N  Vision? N  Difficulty concentrating or making decisions? N  Walking or climbing stairs? N   Dressing or bathing? N  Doing errands, shopping? N  Preparing Food and eating ? N  Using the Toilet? N  In the past six months, have you accidently leaked urine? N  Do you have problems with loss of bowel  control? N  Managing your Medications? N  Managing your Finances? N  Housekeeping or managing your Housekeeping? N  Some recent data might be hidden    Patient Care Team: Tammi Sou, MD as PCP - General (Family Medicine) Jerline Pain, MD as Consulting Physician (Cardiology) Rexene Alberts, MD as Consulting Physician (Cardiothoracic Surgery) [name], MD as Consulting Physician (Urology) Lavonne Chick (Dentistry) Macarthur Critchley, OD as Referring Physician (Optometry)  Indicate any recent Medical Services you may have received from other than [facility] providers in the past year (date may be approximate).     Assessment:   This is a routine wellness examination for Kenneth Ryan.  Hearing/Vision screen  Hearing Screening   125Hz  250Hz  500Hz  1000Hz  2000Hz  3000Hz  4000Hz  6000Hz  8000Hz   Right ear:           Left ear:           Comments: No issues  Vision Screening Comments: Wears glasses Last eye exam-09/2020-Dr. Nicki Reaper  Dietary issues and exercise activities discussed: Current Exercise Habits: The patient does not participate in regular exercise at present, Exercise limited by: None identified  Goals    . Increase physical activity     Start using treadmill again.     . Patient Stated     Complete work on my hot rod      Depression Screen PHQ 2/9 Scores 03/05/2021 03/04/2020 02/27/2019 02/21/2018 02/15/2017 12/16/2016 09/18/2016  PHQ - 2 Score 1 0 0 0 0 0 0    Fall Risk Fall Risk  03/05/2021 03/04/2020 02/27/2019 02/21/2018 02/15/2017  Falls in the past year? 1 0 0 No No  Number falls in past yr: 0 0 - - -  Injury with Fall? 0 0 - - -  Risk for fall due to : History of fall(s) - - - -  Follow up Falls prevention discussed Falls evaluation completed - - -    FALL RISK PREVENTION  PERTAINING TO THE HOME:  Any stairs in or around the home? No  Home free of loose throw rugs in walkways, pet beds, electrical cords, etc? Yes  Adequate lighting in your home to reduce risk of falls? Yes   ASSISTIVE DEVICES UTILIZED TO PREVENT FALLS:  Life alert? No  Use of a cane, walker or w/c? No  Grab bars in the bathroom? No  Shower chair or bench in shower? No  Elevated toilet seat or a handicapped toilet? No   TIMED UP AND GO:  Was the test performed? Yes .  Length of time to ambulate 10 feet: 0 sec.   Gait steady and fast without use of assistive device  Cognitive Function:Normal cognitive status assessed by direct observation by this Nurse Health Advisor. No abnormalities found.   MMSE - Mini Mental State Exam 02/27/2019 02/15/2017  Orientation to time 5 5  Orientation to Place 5 5  Registration 3 3  Attention/ Calculation 5 5  Recall 2 2  Language- name 2 objects 2 2  Language- repeat 1 1  Language- follow 3 step command 3 3  Language- read & follow direction 1 1  Write a sentence 1 1  Copy design 1 1  Total score 29 29        Immunizations Immunization History  Administered Date(s) Administered  . Fluad Quad(high Dose 65+) 09/06/2019  . Influenza, High Dose Seasonal PF 09/07/2016, 09/07/2017, 09/20/2018, 08/29/2020  . Influenza,inj,Quad PF,6+ Mos 09/15/2013, 08/28/2015  . PFIZER(Purple Top)SARS-COV-2 Vaccination 02/18/2020, 03/19/2020, 09/20/2020  . Pneumococcal Conjugate-13 08/28/2015  .  Pneumococcal Polysaccharide-23 09/18/2016  . Tdap 08/28/2015  . Zoster 05/06/2015  . Zoster Recombinat (Shingrix) 09/06/2019, 11/30/2019    TDAP status: Up to date  Flu Vaccine status: Up to date  Pneumococcal vaccine status: Up to date  Covid-19 vaccine status: Completed vaccines  Qualifies for Shingles Vaccine? No   Zostavax completed Yes   Shingrix Completed?: Yes  Screening Tests Health Maintenance  Topic Date Due  . Hepatitis C Screening  10/03/2036  (Originally 17-Nov-1946)  . COLONOSCOPY (Pts 45-68yrs Insurance coverage will need to be confirmed)  12/21/2021  . TETANUS/TDAP  08/27/2025  . INFLUENZA VACCINE  Completed  . COVID-19 Vaccine  Completed  . PNA vac Low Risk Adult  Completed  . HPV VACCINES  Aged Out    Health Maintenance  There are no preventive care reminders to display for this patient.  Colorectal cancer screening: Type of screening: Colonoscopy. Completed 12/22/2011. Repeat every 10 years  Lung Cancer Screening: (Low Dose CT Chest recommended if Age 14-80 years, 30 pack-year currently smoking OR have quit w/in 15years.) does not qualify.     Additional Screening:  Hepatitis C Screening: does qualify; Patient declined  Vision Screening: Recommended annual ophthalmology exams for early detection of glaucoma and other disorders of the eye. Is the patient up to date with their annual eye exam?  Yes  Who is the provider or what is the name of the office in which the patient attends annual eye exams? Dr. Nicki Reaper   Dental Screening: Recommended annual dental exams for proper oral hygiene  Community Resource Referral / Chronic Care Management: CRR required this visit?  No   CCM required this visit?  No      Plan:     I have personally reviewed and noted the following in the patient's chart:   . Medical and social history . Use of alcohol, tobacco or illicit drugs  . Current medications and supplements . Functional ability and status . Nutritional status . Physical activity . Advanced directives . List of other physicians . Hospitalizations, surgeries, and ER visits in previous 12 months . Vitals . Screenings to include cognitive, depression, and falls . Referrals and appointments  In addition, I have reviewed and discussed with patient certain preventive protocols, quality metrics, and best practice recommendations. A written personalized care plan for preventive services as well as general preventive  health recommendations were provided to patient.   Patient to access avs on mychart   Marta Antu, [place]   8/00/3491  Nurse Health Advisor  Nurse Notes: Patient states he has occasionally had some bright red blood in his stool. Appt made with PCP for 03/10/21.

## 2021-03-06 ENCOUNTER — Telehealth: Payer: Self-pay

## 2021-03-06 ENCOUNTER — Ambulatory Visit (INDEPENDENT_AMBULATORY_CARE_PROVIDER_SITE_OTHER): Payer: PPO | Admitting: *Deleted

## 2021-03-06 DIAGNOSIS — Z9889 Other specified postprocedural states: Secondary | ICD-10-CM | POA: Diagnosis not present

## 2021-03-06 DIAGNOSIS — Z5181 Encounter for therapeutic drug level monitoring: Secondary | ICD-10-CM | POA: Diagnosis not present

## 2021-03-06 LAB — POCT INR: INR: 2.4 (ref 2.0–3.0)

## 2021-03-06 NOTE — Patient Instructions (Signed)
Description   Continue taking Warfarin 1 tablet daily except 1/2 tablet on Mondays, Wednesdays, and Fridays. Recheck INR in 6 weeks. Call Coumadin clinic with any questions [phone].

## 2021-03-06 NOTE — Telephone Encounter (Signed)
RX just sent into pharmacy for Irbesartan 300 mg daily po.   Thank you

## 2021-03-06 NOTE — Telephone Encounter (Signed)
[facility] pharmacy is stating that pt's medication Losartan 100 mg tablets, is on backorder without a release date and would like to know if Dr. [name] would like to prescribe an alternative medication? Please address

## 2021-03-10 ENCOUNTER — Ambulatory Visit (INDEPENDENT_AMBULATORY_CARE_PROVIDER_SITE_OTHER): Payer: PPO | Admitting: Family Medicine

## 2021-03-10 ENCOUNTER — Other Ambulatory Visit: Payer: Self-pay

## 2021-03-10 ENCOUNTER — Encounter: Payer: Self-pay | Admitting: Family Medicine

## 2021-03-10 VITALS — BP 118/72 | HR 58 | Temp 97.6°F | Resp 16 | Ht 72.0 in | Wt 224.6 lb

## 2021-03-10 DIAGNOSIS — K921 Melena: Secondary | ICD-10-CM | POA: Diagnosis not present

## 2021-03-10 DIAGNOSIS — Z7901 Long term (current) use of anticoagulants: Secondary | ICD-10-CM

## 2021-03-10 DIAGNOSIS — Z952 Presence of prosthetic heart valve: Secondary | ICD-10-CM

## 2021-03-10 DIAGNOSIS — K625 Hemorrhage of anus and rectum: Secondary | ICD-10-CM

## 2021-03-10 LAB — CBC
HCT: 41.3 % (ref 39.0–52.0)
Hemoglobin: 14.2 g/dL (ref 13.0–17.0)
MCHC: 34.5 g/dL (ref 30.0–36.0)
MCV: 86 fl (ref 78.0–100.0)
Platelets: 189 10*3/uL (ref 150.0–400.0)
RBC: 4.8 Mil/uL (ref 4.22–5.81)
RDW: 14.3 % (ref 11.5–15.5)
WBC: 4.8 10*3/uL (ref 4.0–10.5)

## 2021-03-10 NOTE — Progress Notes (Signed)
OFFICE VISIT  03/10/2021  CC:  Chief Complaint  Patient presents with  . Blood in stools    X 2 weeks , has not had it occur within the last week.  Last colonoscopy was 9 years ago. Last BM was mushy and occurred this morning. Visible blood in the stool and when wiping.   HPI:    Patient is a 75 y.o. Caucasian male on coumadin and ASA who presents for "blood in stools".   Bright red blood in stool a few weeks ago at each bm for a couple days, spont resolved about 10d ago. The stool itself did not have blood, but the toilet water had BRB drops in it.  Has been blood on tissue when wiping sometimes.  No rectal or anal or abd pain.  He doesn't feel anything bulging in the area of his anus.  He is on coumadin for stroke prophylaxis : hx of mechanical aortic valve replacement.  Colonoscopy (screening) 2003 and [place]  ROS as above, plus--> no fevers, no CP, no SOB, no wheezing, no cough, no dizziness, no HAs, no rashes, no melena.  No polyuria or polydipsia.  No myalgias or arthralgias.  No focal weakness, paresthesias, or tremors.  No acute vision or hearing abnormalities.  No dysuria or unusual/new urinary urgency or frequency.  No recent changes in lower legs. No n/v/d or abd pain.  No palpitations.     Past Medical History:  Diagnosis Date  . 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  . Basal cell carcinoma    nose  . Chronic hoarseness    secondary to hx of laryngeal surgery  . GERD (gastroesophageal reflux disease)   . Headache   . History of tobacco abuse   . Hypertriglyceridemia   . IFG (impaired fasting glucose) 2018   A1c 5.4-5.6. 5.8% Nov 2021  . Increased prostate specific antigen (PSA) velocity 09/2016   saw Dr. Jeffie Pollock 11/18/16: plan is to repeat PSA 6 wks and if still same/up then plan TRUS+ bx.  PSA's declined on recheck x 2 and as of 03/10/17  was 2.5.  Recommended annual PSA's with me, f/u with urol prn.  PSA velocity inc 09/2018, repeat PSA 12/07/18 PSA trending up again->to urol->repeats Jan and Apr 2020 trending down, up into ULN range 09/2019->f/u PSA 6 mo  . 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  Marland Kitchen Lesion of vocal cord   . Mitral regurgitation    severe by TEE 08/2016:  MV repair done 11/03/16  . Mitral valve prolapse    MV repair 10/2016  . Nonischemic cardiomyopathy ([place]) 2018   dilated LV, systolic dysfunction: see echo results in Surgical hx section, 01/2017 and 05/2017.  [name] aortic root replacement with bileaflet mechanical prosthetic valve conduit 11/03/2016   Size 27/29 mm On-X bileaflet mechanical valve and synthetic root conduit (coumadin and ASA 81mg  as per cardiology rec's) with reimplantation of left main and right coronary arteries.  Per CV surg, as of 02/16/17, his INR goal is 1.5-2.5.  Marland Kitchen s/p Bentall procedure for aortic root aneurysm ([place]) 11/03/2016  . 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   Complex valvuloplasty including artificial Gore-tex neochord placement x6 and 28 mm Sorin Memo 3D ring annuloplasty  Past Surgical History:  Procedure Laterality Date  . BENTALL PROCEDURE N/A 11/03/2016   Procedure: BENTALL PROCEDURE AORTIC ROOT REPLACEMENT + mechanical AV;  Surgeon: Rexene Alberts, MD;  Location: [place];  Service: Open Heart Surgery;  Laterality: N/A;  . CARDIAC CATHETERIZATION N/A 09/29/2016   Clean coronaries, EF 60-65%.  Procedure: Right/Left Heart Cath and Coronary Angiography;  Surgeon: Jettie Booze, MD;  Location: [place] [facility];  Service: Cardiovascular;  Laterality: N/A;  . CHOLECYSTECTOMY  1980  . COLONOSCOPY  2003 and 2013   Both normal.  . DIRECT LARYNGOSCOPY Left 10/17/2015   Procedure: MICRO DIRECT LARYNGOSCOPY WITH FROZEN SECTION;  Surgeon: Jodi Marble, MD;   Location: [place];  Service: ENT;  Laterality: Left;  . MITRAL VALVE REPAIR N/A 11/03/2016   Procedure: MITRAL VALVE REPAIR (MVR);  Surgeon: Rexene Alberts, MD;  Location: [place];  Service: Open Heart Surgery;  Laterality: N/A;  . PERIPHERAL VASCULAR CATHETERIZATION N/A 09/29/2016   Procedure: Abdominal Aortogram;  Surgeon: Jettie Booze, MD;  Location: [place] [facility];  Service: Cardiovascular;  Laterality: N/A;  . PERIPHERAL VASCULAR CATHETERIZATION N/A 09/29/2016   Procedure: Thoracic Aortogram;  Surgeon: Jettie Booze, MD;  Location: [place] [facility];  Service: Cardiovascular;  Laterality: N/A;  . TEE WITHOUT CARDIOVERSION N/A 09/17/2016   Procedure: TRANSESOPHAGEAL ECHOCARDIOGRAM (TEE);  Surgeon: Jerline Pain, MD;  Location: [place];  Service: Cardiovascular;  Laterality: N/A;  . TEE WITHOUT CARDIOVERSION N/A 11/03/2016   Procedure: TRANSESOPHAGEAL ECHOCARDIOGRAM (TEE);  Surgeon: Rexene Alberts, MD;  Location: [place];  Service: Open Heart Surgery;  Laterality: N/A;  . TONSILECTOMY/ADENOIDECTOMY WITH MYRINGOTOMY  1960  . 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  Mod/severe AR w/functional bicuspid AV.  EF 45%.  . TRANSTHORACIC ECHOCARDIOGRAM  07/2015   Bicuspid aortic valve + mod mitral regurg  . TRANSTHORACIC ECHOCARDIOGRAM  02/01/2017   (Post aortic root/valve surgery): EF 35%, diffuse hypokinesis, LV dilatation, grade I DD. Cardiologist added ARB and increased his BB dose.  Lasix prn added 06/2017 due to some LE edema.  . TRANSTHORACIC ECHOCARDIOGRAM  05/2017; 11/28/20   05/2017 LVEF 45-50%.  Grd II DD.  Mechanical AV good, MV repair good. 11/2020 EF 16-10%, no diastolic dysfxn, mech AV good, MV repair good.  . Vocal cord biopsy  09/2015   No atypia or inflammation.  Some fibrous changes that are possibly from hx of radiation therapy.    Outpatient Medications Prior to Visit  Medication Sig Dispense Refill  . aspirin EC 81 MG EC  tablet Take 1 tablet (81 mg total) by mouth daily.    . fenofibrate (TRICOR) 145 MG tablet TAKE 1 TABLET BY MOUTH EVERY DAY 90 tablet 3  . furosemide (LASIX) 20 MG tablet Take 1 tablet (20 mg total) by mouth daily as needed for fluid or edema (2 or more pounds). 30 tablet 1  . irbesartan (AVAPRO) 300 MG tablet Take 1 tablet (300 mg total) by mouth daily. 90 tablet 3  . metoprolol succinate (TOPROL-XL) 50 MG 24 hr tablet Take 1 tablet (50 mg total) by mouth daily. 90 tablet 3  . Multiple Vitamins-Minerals (MULTIVITAMIN PO) Take 1 tablet by mouth daily.     . pantoprazole (PROTONIX) 40 MG tablet TAKE 1 TABLET BY MOUTH EVERY DAY 90 tablet 1  . warfarin (COUMADIN) 5 MG tablet Take As Directed By Coumadin Clinic 90 tablet 0  . acetaminophen (TYLENOL) 500 MG tablet Take 500-1,000 mg  by mouth every 6 (six) hours as needed for mild pain (depends on pain level if takes 500 mg or 1000 mg).  (Patient not taking: Reported on 03/10/2021)    . amoxicillin (AMOXIL) 500 MG capsule TAKE 4 CAPSULES BY MOUTH 1 HOUR PRIOR TO APPOINTMENT (Patient not taking: Reported on 03/10/2021)    . Ascorbic Acid (VITAMIN C GUMMIE PO) Take by mouth daily. (Patient not taking: Reported on 03/10/2021)     No facility-administered medications prior to visit.    No Known Allergies  ROS As per HPI  PE: Vitals with BMI 03/10/2021 03/05/2021 11/05/2020  Height 6\' 0"  5\' 11"  6\' 0"   Weight 224 lbs 10 oz 222 lbs 6 oz 217 lbs  BMI 30.45 35.32 99.24  Systolic 268 341 962  Diastolic 72 76 68  Pulse 58 70 59     Gen: Alert, well appearing.  Patient is oriented to person, place, time, and situation. AFFECT: pleasant, lucid thought and speech. Anal exam: no mass, hemorrhoid, fissure, or tenderness. No blood noted.  LABS:  Lab Results  Component Value Date   INR 2.4 03/06/2021   INR 2.3 01/23/2021   INR 2.7 12/03/2020   Lab Results  Component Value Date   WBC 5.6 11/04/2020   HGB 15.0 11/04/2020   HCT 43.7 11/04/2020   MCV  86.0 11/04/2020   PLT 202.0 11/04/2020     Chemistry      Component Value Date/Time   NA 138 11/04/2020 1347   NA 140 06/16/2017 1145   K 4.5 11/04/2020 1347   CL 104 11/04/2020 1347   CO2 29 11/04/2020 1347   BUN 13 11/04/2020 1347   BUN 18 06/16/2017 1145   CREATININE 0.96 11/04/2020 1347   CREATININE 0.86 11/23/2016 1156      Component Value Date/Time   CALCIUM 9.4 11/04/2020 1347   ALKPHOS 43 11/04/2020 1347   AST 25 11/04/2020 1347   ALT 17 11/04/2020 1347   BILITOT 0.7 11/04/2020 1347     IMPRESSION AND PLAN:  Painless rectal bleeding, intermittent-->resolved approx 10 d/a. Pt on coumadin for stroke prophylaxis due to having mechanical AV, INRs have been therapeutic consistently, most recent was 2.4 four days ago. Ok to continue coumadin.  Check cbc today. Last screening colonoscopy 2013--normal, [place] state. Refer to GI ordered today.  An After Visit Summary was printed and given to the patient.  FOLLOW UP: Return if symptoms worsen or fail to improve.  Signed:  Crissie Sickles, MD           03/10/2021

## 2021-03-20 ENCOUNTER — Ambulatory Visit: Payer: PPO | Admitting: Nurse Practitioner

## 2021-03-20 ENCOUNTER — Telehealth: Payer: Self-pay

## 2021-03-20 ENCOUNTER — Encounter: Payer: Self-pay | Admitting: Nurse Practitioner

## 2021-03-20 VITALS — BP 102/60 | HR 60 | Ht 71.25 in | Wt 224.5 lb

## 2021-03-20 DIAGNOSIS — K625 Hemorrhage of anus and rectum: Secondary | ICD-10-CM

## 2021-03-20 DIAGNOSIS — Z9889 Other specified postprocedural states: Secondary | ICD-10-CM | POA: Diagnosis not present

## 2021-03-20 MED ORDER — SUTAB 1479-225-188 MG PO TABS: 1.0000 | ORAL_TABLET | ORAL | 0 refills | Status: DC

## 2021-03-20 NOTE — Telephone Encounter (Signed)
Request for surgical clearance:     Endoscopy Procedure  What type of surgery is being performed?     Colonoscopy  When is this surgery scheduled?     05/16/21  What type of clearance is required ?   Pharmacy and if antibiotic is needed prior.  Are there any medications that need to be held prior to surgery and how long? Coumadin  Practice name and name of physician performing surgery?      [facility]  What is your office phone and fax number?      Phone- [phone]  [phone]  Anesthesia type (None, local, MAC, general) ?       MAC

## 2021-03-20 NOTE — Patient Instructions (Addendum)
If you are age 73 or older, your body mass index should be between 23-30. Your Body mass index is 31.09 kg/m. If this is out of the aforementioned range listed, please consider follow up with your Primary Care Provider.  PROCEDURES: You have been scheduled for a colonoscopy. Please follow the written instructions given to you at your visit today. Please pick up your prep supplies at the pharmacy within the next 1-3 days. If you use inhalers (even only as needed), please bring them with you on the day of your procedure.  You will be contacted by our office prior to your procedure for directions on holding your Coumadin and if you will need an antibiotic.  If you do not hear from our office 1 week prior to your scheduled procedure, please call [phone] to discuss.   Please call our office if you start having the rectal bleeding again.  OVER THE COUNTER MEDICATION Please purchase the following medications over the counter and take as directed:  Desitin: Apply a small amount to the external anal area three times a day as needed.  It was great seeing you today! Thank you for entrusting me with your care and choosing [facility].  Noralyn Pick, CRNP

## 2021-03-20 NOTE — Progress Notes (Addendum)
03/20/2021 Kenneth Ryan [id number] 06-23-1946   CHIEF COMPLAINT: Rectal bleeding   HISTORY OF PRESENT ILLNESS:  Kenneth Ryan is a 75 year old male with a past medical history of aortic insufficiency and MVP s/p AVR and MV repair 10/2016, nonischemic cardiomyopathy with LV EF 50 - 55% per ECHO 11/2020), hypertriglyceridemia, GERD and laryngeal cancer 2002 s/p surgery and radiation.  Past cholecystectomy.  He presents to our office today as referred by Dr. Julien Nordmann for further evaluation regarding rectal bleeding.  He describes seeing bright red blood on the toilet tissue and in the toilet water which was separate from his bowel movement 4-6 times over a 2 week period one month ago. No further rectal bleeding for the past 2 weeks. He is on ASA and Coumadin.  He occasionally strains to pass a bowel movement.  His bowel pattern varies.  He can pass a normal formed brown stool, a soft stool or loose stool on any given day.  He possibly had looser stools on the days he had rectal bleeding.  No associated anal rectal pain.  No abdominal pain.  He underwent a colonoscopy while living in [place] in 2003 and in 2013 which he reported were normal, no polyps.  He takes Pantoprazole 40 mg once daily.  No dysphagia or heartburn.  No upper abdominal pain.  He was last seen by his cardiologist Dr. [name] on 11/05/2020.  At that time, his cardiac status was stable.  A repeat echo on 11/28/2020 showed LVEF 50 to 55% which was up from a prior echo which showed LVEF of 45%.  He reports having a chronic chest wall pain since his AVR and MV repair surgery.  He stated this pain is superficial and is not new.  Otherwise, no chest pain.  No palpitations, dizziness or shortness of breath.  He has lower extremity edema for which she takes Lasix.  No other complaints at this time.  CBC Latest Ref Rng & Units 03/10/2021 11/04/2020 10/18/2019  WBC 4.0 - 10.5 K/uL 4.8 5.6 4.7  Hemoglobin 13.0 - 17.0 g/dL 14.2  15.0 14.4  Hematocrit 39.0 - 52.0 % 41.3 43.7 42.5  Platelets 150.0 - 400.0 K/uL 189.0 202.0 181.0    ECHO 11/28/2020: 1. Left ventricular ejection fraction, by estimation, is 50 to 55%. The left ventricle has low normal function. The left ventricle has no regional wall motion abnormalities. The left ventricular internal cavity size was mildly dilated. Left ventricular diastolic parameters were normal. 2. Right ventricular systolic function is normal. The right ventricular size is normal. 3. Left atrial size was moderately dilated. 4. The mitral valve is degenerative. No evidence of mitral valve regurgitation. Mild mitral stenosis. Moderate mitral annular calcification. 5. Post AVR mechanical bi leaflet stable gradients since 2018 would bring patient back for further interrogation of color flow. See image 68 cannot tell if moderate PVL vs mitral inflow similar appearance in 2018 PVL not appreciated in PSL views but prone to get shadowing artifact in these views . The aortic valve has been repaired/replaced. Aortic valve regurgitation see below. No aortic stenosis is present. 6. The inferior vena cava is normal in size with greater than 50% respiratory variability, suggesting right atrial pressure of 3 mmHg.   Past Medical History:  Diagnosis Date  . 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  . Basal cell carcinoma  nose  . Chronic hoarseness    secondary to hx of laryngeal surgery  . Gallstones   . GERD (gastroesophageal reflux disease)   . Headache   . History of tobacco abuse   . Hypertriglyceridemia   . IFG (impaired fasting glucose) 2018   A1c 5.4-5.6. 5.8% Nov 2021  . Increased prostate specific antigen (PSA) velocity 09/2016   saw Dr. Jeffie Pollock 11/18/16: plan is to repeat PSA 6 wks and if still same/up then plan TRUS+ bx.  PSA's declined on recheck x 2 and as of 03/10/17 was  2.5.  Recommended annual PSA's with me, f/u with urol prn.  PSA velocity inc 09/2018, repeat PSA 12/07/18 PSA trending up again->to urol->repeats Jan and Apr 2020 trending down, up into ULN range 09/2019->f/u PSA 6 mo  . 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  Marland Kitchen Lesion of vocal cord   . Mitral regurgitation    severe by TEE 08/2016:  MV repair done 11/03/16  . Mitral valve prolapse    MV repair 10/2016  . Nonischemic cardiomyopathy ([place]) 2018   dilated LV, systolic dysfunction: see echo results in Surgical hx section, 01/2017 and 05/2017.  [name] aortic root replacement with bileaflet mechanical prosthetic valve conduit 11/03/2016   Size 27/29 mm On-X bileaflet mechanical valve and synthetic root conduit (coumadin and ASA 79m as per cardiology rec's) with reimplantation of left main and right coronary arteries.  Per CV surg, as of 02/16/17, his INR goal is 1.5-2.5.  .Marland Kitchens/p [place]  . 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   Complex valvuloplasty including artificial Gore-tex neochord placement x6 and 28 mm Sorin Memo 3D ring annuloplasty   Past Surgical History:  Procedure Laterality Date  . BENTALL PROCEDURE N/A 11/03/2016   Procedure: BENTALL PROCEDURE AORTIC ROOT REPLACEMENT + mechanical AV;  Surgeon: CRexene Alberts Kenneth Ryan;  [place]  Service: Open Heart Surgery;  Laterality: N/A;  . CARDIAC CATHETERIZATION N/A 09/29/2016   Clean coronaries, EF 60-65%.  Procedure: Right/Left Heart Cath and Coronary Angiography;  Surgeon: JJettie Booze Kenneth Ryan;  [place];  Service: Cardiovascular;  Laterality: N/A;  . CHOLECYSTECTOMY  1980  . COLONOSCOPY  2003 and 2013   Both normal.  . DIRECT LARYNGOSCOPY Left 10/17/2015   Procedure: MICRO DIRECT LARYNGOSCOPY WITH FROZEN SECTION;  Surgeon: KJodi Marble Kenneth Ryan;   [place]  Service: ENT;  Laterality: Left;  . MITRAL VALVE REPAIR N/A 11/03/2016   Procedure: MITRAL VALVE REPAIR (MVR);  Surgeon: CRexene Alberts Kenneth Ryan;  [place]  Service: Open Heart Surgery;  Laterality: N/A;  . PERIPHERAL VASCULAR CATHETERIZATION N/A 09/29/2016   Procedure: Abdominal Aortogram;  Surgeon: JJettie Booze Kenneth Ryan;  [place] [facility];  Service: Cardiovascular;  Laterality: N/A;  . PERIPHERAL VASCULAR CATHETERIZATION N/A 09/29/2016   Procedure: Thoracic Aortogram;  Surgeon: JJettie Booze Kenneth Ryan;  [place] [facility];  Service: Cardiovascular;  Laterality: N/A;  . TEE WITHOUT CARDIOVERSION N/A 09/17/2016   Procedure: TRANSESOPHAGEAL ECHOCARDIOGRAM (TEE);  Surgeon: MJerline Pain Kenneth Ryan;  [place]  Service: Cardiovascular;  Laterality: N/A;  . TEE WITHOUT CARDIOVERSION N/A 11/03/2016   Procedure: TRANSESOPHAGEAL ECHOCARDIOGRAM (TEE);  Surgeon: CRexene Alberts Kenneth Ryan;  [place]  Service: Open Heart Surgery;  Laterality: N/A;  . TONSILECTOMY/ADENOIDECTOMY WITH MYRINGOTOMY  1960  .  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  Mod/severe AR w/functional bicuspid AV.  EF 45%.  . TRANSTHORACIC ECHOCARDIOGRAM  07/2015   Bicuspid aortic valve + mod mitral regurg  . TRANSTHORACIC ECHOCARDIOGRAM  02/01/2017   (Post aortic root/valve surgery): EF 35%, diffuse hypokinesis, LV dilatation, grade I DD. Cardiologist added ARB and increased his BB dose.  Lasix prn added 06/2017 due to some LE edema.  . TRANSTHORACIC ECHOCARDIOGRAM  05/2017; 11/28/20   05/2017 LVEF 45-50%.  Grd II DD.  Mechanical AV good, MV repair good. 11/2020 EF 09-62%, no diastolic dysfxn, mech AV good, MV repair good.  . Vocal cord biopsy  09/2015   No atypia or inflammation.  Some fibrous changes that are possibly from hx of radiation therapy.    Father History: Father died age 45 history of heart disease, acute appendicitis. Mother deceased  21 secondary to multiple myeloma.  Sisters x 2 with breast cancer. Brother with pre-diabetes.   Social History: He is widowed.  He is retired.  He smoked cigarettes 1ppd 15 to 20 years, quit smoking in the 1980's. He drinks 3 beers and 2 shots once weekly. No drug use.   No Known Allergies    Outpatient Encounter Medications as of 03/20/2021  Medication Sig  . acetaminophen (TYLENOL) 500 MG tablet Take 500-1,000 mg by mouth every 6 (six) hours as needed for mild pain (depends on pain level if takes 500 mg or 1000 mg).  Marland Kitchen aspirin EC 81 MG EC tablet Take 1 tablet (81 mg total) by mouth daily.  . fenofibrate (TRICOR) 145 MG tablet TAKE 1 TABLET BY MOUTH EVERY DAY  . furosemide (LASIX) 20 MG tablet Take 1 tablet (20 mg total) by mouth daily as needed for fluid or edema (2 or more pounds).  . irbesartan (AVAPRO) 300 MG tablet Take 1 tablet (300 mg total) by mouth daily.  . metoprolol succinate (TOPROL-XL) 50 MG 24 hr tablet Take 1 tablet (50 mg total) by mouth daily.  . Multiple Vitamins-Minerals (MULTIVITAMIN PO) Take 1 tablet by mouth daily.   . pantoprazole (PROTONIX) 40 MG tablet TAKE 1 TABLET BY MOUTH EVERY DAY  . warfarin (COUMADIN) 5 MG tablet Take As Directed By Coumadin Clinic  . amoxicillin (AMOXIL) 500 MG capsule TAKE 4 CAPSULES BY MOUTH 1 HOUR PRIOR TO APPOINTMENT (Patient not taking: No sig reported)   No facility-administered encounter medications on file as of 03/20/2021.    REVIEW OF SYSTEMS:  Gen: Denies fever, sweats or chills. No weight loss.  CV: See HPI. Resp: Denies cough, shortness of breath of hemoptysis.  GI: See HPI. GU : Denies urinary burning, blood in urine, increased urinary frequency or incontinence. MS: Denies joint pain, muscles aches or weakness. Derm: Denies rash, itchiness, skin lesions or unhealing ulcers. Psych: Denies depression, anxiety memory loss. Heme: Denies bruising, bleeding. Neuro:  Denies headaches, dizziness or paresthesias. Endo:  Denies  any problems with DM, thyroid or adrenal function.  PHYSICAL EXAM: Ht 5' 11.25" (1.81 m) Comment: height measured without shoes  Wt 224 lb 8 oz (101.8 kg)   BMI 31.09 kg/m   BP 102/60 (BP Location: Left Arm, Patient Position: Sitting, Cuff Size: Normal)   Pulse 60   Ht 5' 11.25" (1.81 m) Comment: height measured without shoes  Wt 224 lb 8 oz (101.8 kg)   BMI 31.09 kg/m   General: 75 year old male in no acute distress. Head: Normocephalic and atraumatic. Eyes:  Sclerae non-icteric, conjunctive pink.  Ears: Normal auditory acuity. Mouth: Dentition intact. No ulcers or lesions.  Neck: Supple, no lymphadenopathy or thyromegaly.  Lungs: Clear bilaterally to auscultation without wheezes, crackles or rhonchi. Heart: Regular rate and rhythm. No murmur, rub or gallop appreciated.  Abdomen: Soft, nontender, non distended. No masses. No hepatosplenomegaly. Normoactive bowel sounds x 4 quadrants.  Rectal: Patient declined exam, he elected to proceed with a colonoscopy for further evaluation.  Musculoskeletal: Symmetrical with no gross deformities. Skin: Warm and dry. No rash or lesions on visible extremities. Extremities: Bilateral LEs with 1+ edema.  Neurological: Alert oriented x 4, no focal deficits.  Psychological:  Alert and cooperative. Normal mood and affect.  ASSESSMENT AND PLAN:  47. 75 year old male s/p AVR (mechanical valve) and MV repair 10/2016 on Warfarin and ASA presents for further evaluation regarding rectal bleeding. Hg 14.2.  -Colonoscopy benefits and risks discussed including risk with sedation, risk of bleeding, perforation and infection  -Our office will contact Dr. [name] to verify Warfarin instructions prior to proceeding with a colonoscopy  -Apply a small amount of Desitin inside the anal opening and to the external anal area tid as needed for anorectal bleeding -Patient to call our office if rectal bleeding recurs prior to his colonoscopy date  2.  History of GERD,  stable on Pantoprazole 40 mg once daily  3. History of laryngeal cancer s/p surgery and radiation in 2002  4. Variable bowel pattern, occasional constipation or loose stools -Benefiber 1 tablespoon daily as tolerated   5.  Ischemic cardiomyopathy. LV EF 50 -55%. On metoprolol, Avapro and Lasix   Further recommendations to be determined after the above evaluation completed  ADDENDUM: EGD to be done at time of EGD. Patient provided consent for EGD at time of colonoscopy.  Per cardiology NP Coletta Memos 03/21/2021: Patientdoes notrequire pre-op antibiotics. Per office protocol, patient can holdwarfarinfor 5days prior to procedure.  Patientwill NOTneed bridging with Lovenox (enoxaparin) around procedu      CC:  [name], Kenneth Blackwater, Kenneth Ryan

## 2021-03-21 NOTE — Telephone Encounter (Signed)
Patient with diagnosis of On-X mechanical aortic valve replacement on warfarin for anticoagulation.    Procedure: colonoscopy Date of procedure: 05/16/21  Patient does not require pre-op antibiotics.  Per office protocol, patient can hold warfarin for 5 days prior to procedure.   Patient will NOT need bridging with Lovenox (enoxaparin) around procedure.

## 2021-03-21 NOTE — Telephone Encounter (Signed)
   Primary Cardiologist: Candee Furbish, MD  Chart reviewed as part of pre-operative protocol coverage. Given past medical history and time since last visit, based on ACC/AHA guidelines, Kenneth Ryan would be at acceptable risk for the planned procedure without further cardiovascular testing.   Patient with diagnosis of mechanical aortic valve replacement on warfarin for anticoagulation.    Procedure: colonoscopy Date of procedure: 05/16/21  Patient does not require pre-op antibiotics.  Per office protocol, patient can hold warfarin for 5 days prior to procedure.   Patient will NOT need bridging with Lovenox (enoxaparin) around procedure.  I will route this recommendation to the requesting party via Epic fax function and remove from pre-op pool.  Please call with questions.  Jossie Ng. Harley Mccartney NP-C    03/21/2021, 12:57 PM [place] [address] Office [phone] Fax [phone]

## 2021-03-26 NOTE — Telephone Encounter (Signed)
Notified patient that he does not need abx before his colonoscopy.  Notified patient to hold his blood thinner 5 days before colonoscopy per cardiology and will not need Lovenox bridging.  Patient expressed understanding, no further questions.

## 2021-03-26 NOTE — Addendum Note (Signed)
Addended by: Cardell Peach I on: 03/26/2021 12:56 PM   Modules accepted: Orders

## 2021-03-27 ENCOUNTER — Telehealth: Payer: Self-pay | Admitting: Nurse Practitioner

## 2021-03-27 MED ORDER — SUTAB 1479-225-188 MG PO TABS: 1.0000 | ORAL_TABLET | ORAL | 0 refills | Status: DC

## 2021-03-27 NOTE — Telephone Encounter (Signed)
Resent RX with manufacturer code so cost will only be 40.00

## 2021-03-27 NOTE — Telephone Encounter (Signed)
Pt called to inform that out-pocket cost for Sutab is over $100.

## 2021-04-02 NOTE — Progress Notes (Signed)
Addendum: Reviewed and agree with assessment and management plan. With chronic GERD and no report of prior EGD, EGD to exclude Barrett's esophagus is reasonable at time of colonoscopy when he will be off AC. Teagan Ozawa, Lajuan Lines, MD

## 2021-04-02 NOTE — Progress Notes (Signed)
I called the patient and discussed scheduling an EGD at the time of his scheduled colonoscopy with  Dr. Hilarie Fredrickson due to his history of GERD on PPI, never had an EGD. See Dr. Vena Rua addendum, EGD recommended at the time of colonoscopy. I called Kenneth Ryan an discussed adding the EGD to his colonoscopy orders. EGD benefits and risks discussed including risk with sedation, risk of bleeding, perforation and infection. He provided consent to proceed with EGD and colonoscopy. He stated his cardiologist advised him to hold Warfarin for 5 days prior to his procedure date.   Beth, pls add EGD to his colonoscopy orders/date. I spoke to the patient and he is aware his procedure date may need to be changed if his current colonoscopy date does not allow enough time for added EGD. Pls him to schedule EGD with colonoscopy thx

## 2021-04-03 NOTE — Progress Notes (Signed)
Kenneth Ryan okay'd adding the EGD. I called Kenneth Ryan to let him know the procedures will both be done on his planned date on 05/16/21.

## 2021-04-17 ENCOUNTER — Other Ambulatory Visit: Payer: Self-pay

## 2021-04-17 ENCOUNTER — Ambulatory Visit (INDEPENDENT_AMBULATORY_CARE_PROVIDER_SITE_OTHER): Payer: PPO | Admitting: *Deleted

## 2021-04-17 DIAGNOSIS — Z5181 Encounter for therapeutic drug level monitoring: Secondary | ICD-10-CM | POA: Diagnosis not present

## 2021-04-17 DIAGNOSIS — Q2543 Congenital aneurysm of aorta: Secondary | ICD-10-CM

## 2021-04-17 DIAGNOSIS — I7121 Aneurysm of the ascending aorta, without rupture: Secondary | ICD-10-CM

## 2021-04-17 DIAGNOSIS — Z954 Presence of other heart-valve replacement: Secondary | ICD-10-CM | POA: Diagnosis not present

## 2021-04-17 DIAGNOSIS — Z9889 Other specified postprocedural states: Secondary | ICD-10-CM

## 2021-04-17 DIAGNOSIS — I719 Aortic aneurysm of unspecified site, without rupture: Secondary | ICD-10-CM | POA: Diagnosis not present

## 2021-04-17 LAB — POCT INR: INR: 2.2 (ref 2.0–3.0)

## 2021-04-17 NOTE — Patient Instructions (Signed)
Description   Continue taking Warfarin 1 tablet daily except 1/2 tablet on Mondays, Wednesdays, and Fridays. Recheck INR in 1 week post procedure (6 weeks). Call Coumadin clinic with any questions [phone].    When you resume your Warfarin take an extra 1/2 tablet for 2 days then resume normal dose.

## 2021-05-03 ENCOUNTER — Other Ambulatory Visit: Payer: Self-pay | Admitting: Cardiology

## 2021-05-03 DIAGNOSIS — I420 Dilated cardiomyopathy: Secondary | ICD-10-CM

## 2021-05-03 DIAGNOSIS — I719 Aortic aneurysm of unspecified site, without rupture: Secondary | ICD-10-CM

## 2021-05-03 DIAGNOSIS — I7121 Aneurysm of the ascending aorta, without rupture: Secondary | ICD-10-CM

## 2021-05-03 DIAGNOSIS — I10 Essential (primary) hypertension: Secondary | ICD-10-CM

## 2021-05-03 DIAGNOSIS — Q2543 Congenital aneurysm of aorta: Secondary | ICD-10-CM

## 2021-05-03 DIAGNOSIS — Z9889 Other specified postprocedural states: Secondary | ICD-10-CM

## 2021-05-03 DIAGNOSIS — Z954 Presence of other heart-valve replacement: Secondary | ICD-10-CM

## 2021-05-12 ENCOUNTER — Telehealth: Payer: Self-pay

## 2021-05-12 ENCOUNTER — Other Ambulatory Visit: Payer: Self-pay

## 2021-05-12 DIAGNOSIS — K625 Hemorrhage of anus and rectum: Secondary | ICD-10-CM

## 2021-05-12 NOTE — Telephone Encounter (Signed)
Called patient and LM that procedures at [facility] have been canceled due to needing it as [facility]  Hospital order placed. I need to check for a day and time I can place him.

## 2021-05-12 NOTE — Telephone Encounter (Signed)
Provider changed to [place] due to first avaliable.  Procedure is scheduled 06/05/21 at [facility] start time 10:45am patient to arrive at 9:15am

## 2021-05-12 NOTE — Telephone Encounter (Signed)
===  View-only below this line=== ----- Message ----- From: Jerene Bears, MD Sent: 05/12/2021   9:14 AM EDT To: Larina Bras, CMA Subject: RE: [place] pt                                     Next available provider please given rectal bleeding JMP

## 2021-05-12 NOTE — Telephone Encounter (Signed)
-----   Message from Larina Bras, [place] sent at 05/12/2021  8:08 AM EDT ----- Regarding: FW: [place] pt  ----- Message ----- From: Osvaldo Angst, CRNA Sent: 05/12/2021   7:55 AM EDT To: Larina Bras, CMA, Jerene Bears, MD Subject: [facility] pt                                         Dr. Hilarie Fredrickson,  This pt is scheduled with you for an [place] procedure on 5/27.  Unfortunately they have been diagnosed as a difficult intubation so their procedure will need to be performed at the hospital.  Thanks,  Osvaldo Angst

## 2021-05-14 ENCOUNTER — Other Ambulatory Visit: Payer: Self-pay | Admitting: Family Medicine

## 2021-05-16 ENCOUNTER — Encounter: Payer: PPO | Admitting: Internal Medicine

## 2021-05-21 DIAGNOSIS — Z8719 Personal history of other diseases of the digestive system: Secondary | ICD-10-CM

## 2021-05-21 DIAGNOSIS — K2270 Barrett's esophagus without dysplasia: Secondary | ICD-10-CM

## 2021-05-21 DIAGNOSIS — K625 Hemorrhage of anus and rectum: Secondary | ICD-10-CM

## 2021-05-21 HISTORY — DX: Hemorrhage of anus and rectum: K62.5

## 2021-05-21 HISTORY — DX: Personal history of other diseases of the digestive system: Z87.19

## 2021-05-21 HISTORY — DX: Barrett's esophagus without dysplasia: K22.70

## 2021-05-22 ENCOUNTER — Ambulatory Visit (INDEPENDENT_AMBULATORY_CARE_PROVIDER_SITE_OTHER): Payer: PPO | Admitting: *Deleted

## 2021-05-22 ENCOUNTER — Other Ambulatory Visit: Payer: Self-pay

## 2021-05-22 DIAGNOSIS — Z5181 Encounter for therapeutic drug level monitoring: Secondary | ICD-10-CM

## 2021-05-22 DIAGNOSIS — Z9889 Other specified postprocedural states: Secondary | ICD-10-CM | POA: Diagnosis not present

## 2021-05-22 LAB — POCT INR: INR: 2.5 (ref 2.0–3.0)

## 2021-05-22 NOTE — Patient Instructions (Signed)
Description   Continue normal dose of warfarin 1 tablet daily except for 1/2 a tablet on Monday, Wednesday and Friday. Hold warfarin 6/11-6/15 prior to procedure. Take an extra 1/2 tablet with your normal dose the evening you resume your warfarin.  Recheck INR 1 week post procedure. Coumadin Clinic [phone].

## 2021-06-02 NOTE — Progress Notes (Signed)
Attempted to obtain medical history via telephone, unable to reach at this time. I left a voicemail to return pre surgical testing department's phone call.  

## 2021-06-03 ENCOUNTER — Other Ambulatory Visit: Payer: Self-pay

## 2021-06-04 NOTE — Anesthesia Preprocedure Evaluation (Addendum)
Anesthesia Evaluation  Patient identified by MRN, date of birth, ID band Patient awake    Reviewed: Allergy & Precautions, NPO status , Patient's Chart, lab work & pertinent test results  Airway Mallampati: II  TM Distance: >3 FB Neck ROM: Full    Dental no notable dental hx. (+) Teeth Intact, Dental Advisory Given   Pulmonary former smoker,    Pulmonary exam normal breath sounds clear to auscultation       Cardiovascular hypertension, Pt. on medications and Pt. on home beta blockers Normal cardiovascular exam Rhythm:Regular Rate:Normal  Non ischemic cardiomyopathy   Neuro/Psych    GI/Hepatic Neg liver ROS, GERD  ,  Endo/Other    Renal/GU negative Renal ROS     Musculoskeletal   Abdominal   Peds  Hematology   Anesthesia Other Findings Hx of laryngeal cA s/p RADS  Reproductive/Obstetrics                           Anesthesia Physical Anesthesia Plan  ASA: 3  Anesthesia Plan: MAC   Post-op Pain Management:    Induction:   PONV Risk Score and Plan: Treatment may vary due to age or medical condition  Airway Management Planned: Natural Airway  Additional Equipment: None  Intra-op Plan:   Post-operative Plan:   Informed Consent: I have reviewed the patients History and Physical, chart, labs and discussed the procedure including the risks, benefits and alternatives for the proposed anesthesia with the patient or authorized representative who has indicated his/her understanding and acceptance.     Dental advisory given  Plan Discussed with: CRNA and Anesthesiologist  Anesthesia Plan Comments: (Rectal bleeding for EGD Colon)       Anesthesia Quick Evaluation

## 2021-06-05 ENCOUNTER — Ambulatory Visit (HOSPITAL_COMMUNITY)
Admission: RE | Admit: 2021-06-05 | Discharge: 2021-06-05 | Disposition: A | Discharge status: Home / Self Care | Payer: PPO | Attending: Gastroenterology | Admitting: Gastroenterology

## 2021-06-05 ENCOUNTER — Other Ambulatory Visit: Payer: Self-pay

## 2021-06-05 ENCOUNTER — Ambulatory Visit (HOSPITAL_COMMUNITY): Payer: PPO | Admitting: Anesthesiology

## 2021-06-05 ENCOUNTER — Encounter (HOSPITAL_COMMUNITY): Payer: Self-pay | Admitting: Gastroenterology

## 2021-06-05 ENCOUNTER — Encounter (HOSPITAL_COMMUNITY)
Admission: RE | Disposition: A | Discharge status: Home / Self Care | Payer: Self-pay | Source: Home / Self Care | Attending: Gastroenterology

## 2021-06-05 DIAGNOSIS — Z7982 Long term (current) use of aspirin: Secondary | ICD-10-CM | POA: Diagnosis not present

## 2021-06-05 DIAGNOSIS — K3189 Other diseases of stomach and duodenum: Secondary | ICD-10-CM | POA: Diagnosis not present

## 2021-06-05 DIAGNOSIS — K219 Gastro-esophageal reflux disease without esophagitis: Secondary | ICD-10-CM | POA: Insufficient documentation

## 2021-06-05 DIAGNOSIS — Z7901 Long term (current) use of anticoagulants: Secondary | ICD-10-CM | POA: Insufficient documentation

## 2021-06-05 DIAGNOSIS — K2990 Gastroduodenitis, unspecified, without bleeding: Secondary | ICD-10-CM | POA: Diagnosis not present

## 2021-06-05 DIAGNOSIS — Z79899 Other long term (current) drug therapy: Secondary | ICD-10-CM | POA: Diagnosis not present

## 2021-06-05 DIAGNOSIS — K644 Residual hemorrhoidal skin tags: Secondary | ICD-10-CM | POA: Diagnosis not present

## 2021-06-05 DIAGNOSIS — K648 Other hemorrhoids: Secondary | ICD-10-CM | POA: Insufficient documentation

## 2021-06-05 DIAGNOSIS — K259 Gastric ulcer, unspecified as acute or chronic, without hemorrhage or perforation: Secondary | ICD-10-CM | POA: Diagnosis not present

## 2021-06-05 DIAGNOSIS — K2970 Gastritis, unspecified, without bleeding: Secondary | ICD-10-CM

## 2021-06-05 DIAGNOSIS — I428 Other cardiomyopathies: Secondary | ICD-10-CM | POA: Insufficient documentation

## 2021-06-05 DIAGNOSIS — K2289 Other specified disease of esophagus: Secondary | ICD-10-CM | POA: Diagnosis not present

## 2021-06-05 DIAGNOSIS — E781 Pure hyperglyceridemia: Secondary | ICD-10-CM | POA: Insufficient documentation

## 2021-06-05 DIAGNOSIS — Z8521 Personal history of malignant neoplasm of larynx: Secondary | ICD-10-CM | POA: Diagnosis not present

## 2021-06-05 DIAGNOSIS — D123 Benign neoplasm of transverse colon: Secondary | ICD-10-CM | POA: Insufficient documentation

## 2021-06-05 DIAGNOSIS — Z923 Personal history of irradiation: Secondary | ICD-10-CM | POA: Diagnosis not present

## 2021-06-05 DIAGNOSIS — Z952 Presence of prosthetic heart valve: Secondary | ICD-10-CM | POA: Insufficient documentation

## 2021-06-05 DIAGNOSIS — K2270 Barrett's esophagus without dysplasia: Secondary | ICD-10-CM | POA: Insufficient documentation

## 2021-06-05 DIAGNOSIS — K625 Hemorrhage of anus and rectum: Secondary | ICD-10-CM | POA: Diagnosis not present

## 2021-06-05 DIAGNOSIS — K635 Polyp of colon: Secondary | ICD-10-CM | POA: Diagnosis not present

## 2021-06-05 DIAGNOSIS — K317 Polyp of stomach and duodenum: Secondary | ICD-10-CM | POA: Insufficient documentation

## 2021-06-05 HISTORY — PX: ESOPHAGOGASTRODUODENOSCOPY (EGD) WITH PROPOFOL: SHX5813

## 2021-06-05 HISTORY — PX: POLYPECTOMY: SHX5525

## 2021-06-05 HISTORY — PX: COLONOSCOPY WITH PROPOFOL: SHX5780

## 2021-06-05 HISTORY — PX: BIOPSY: SHX5522

## 2021-06-05 SURGERY — COLONOSCOPY WITH PROPOFOL
Anesthesia: Monitor Anesthesia Care

## 2021-06-05 MED ORDER — LACTATED RINGERS IV SOLN: INTRAVENOUS | Status: DC | PRN

## 2021-06-05 MED ORDER — LACTATED RINGERS IV SOLN
INTRAVENOUS | Status: AC | PRN
  Administered 2021-06-05: 10 mL/h via INTRAVENOUS

## 2021-06-05 MED ORDER — EPHEDRINE SULFATE-NACL 50-0.9 MG/10ML-% IV SOSY
PREFILLED_SYRINGE | INTRAVENOUS | Status: DC | PRN
  Administered 2021-06-05: 10 mg via INTRAVENOUS

## 2021-06-05 MED ORDER — LIDOCAINE 2% (20 MG/ML) 5 ML SYRINGE
INTRAMUSCULAR | Status: DC | PRN
  Administered 2021-06-05: 100 mg via INTRAVENOUS

## 2021-06-05 MED ORDER — SODIUM CHLORIDE 0.9 % IV SOLN: INTRAVENOUS | Status: DC

## 2021-06-05 MED ORDER — GLYCOPYRROLATE PF 0.2 MG/ML IJ SOSY
PREFILLED_SYRINGE | INTRAMUSCULAR | Status: DC | PRN
  Administered 2021-06-05: .2 mg via INTRAVENOUS

## 2021-06-05 MED ORDER — PHENYLEPHRINE 40 MCG/ML (10ML) SYRINGE FOR IV PUSH (FOR BLOOD PRESSURE SUPPORT)
PREFILLED_SYRINGE | INTRAVENOUS | Status: DC | PRN
  Administered 2021-06-05 (×2): 80 ug via INTRAVENOUS
  Administered 2021-06-05: 40 ug via INTRAVENOUS
  Administered 2021-06-05 (×2): 80 ug via INTRAVENOUS

## 2021-06-05 MED ORDER — PROPOFOL 500 MG/50ML IV EMUL
INTRAVENOUS | Status: DC | PRN
  Administered 2021-06-05: 150 ug/kg/min via INTRAVENOUS

## 2021-06-05 MED ORDER — PROPOFOL 10 MG/ML IV BOLUS
INTRAVENOUS | Status: DC | PRN
  Administered 2021-06-05 (×2): 20 mg via INTRAVENOUS

## 2021-06-05 SURGICAL SUPPLY — 24 items

## 2021-06-05 NOTE — Op Note (Signed)
[facility] Patient Name: Kenneth Ryan Procedure Date: 06/05/2021 MRN: [id number] Attending MD: Thornton Park MD, MD Date of Birth: 07/01/1946 CSN: [id number] Age: 75 Admit Type: Outpatient Procedure:                Colonoscopy Indications:              Rectal bleeding - 2 weeks of painless rectal                            bleeding, now resolved                           Prior colonoscopy in 2003 and 2013 in [place],                            normal per patient report Providers:                Thornton Park MD, MD, Nelia Shi, RN,                            Fransico Setters Mbumina, Technician Referring MD:              Medicines:                Monitored Anesthesia Care Complications:            No immediate complications. Estimated blood loss:                            Minimal. Estimated Blood Loss:     Estimated blood loss was minimal. Procedure:                Pre-Anesthesia Assessment:                           - Prior to the procedure, a History and Physical                            was performed, and patient medications and                            allergies were reviewed. The patient's tolerance of                            previous anesthesia was also reviewed. The risks                            and benefits of the procedure and the sedation                            options and risks were discussed with the patient.                            All questions were answered, and informed consent                            was obtained. Prior Anticoagulants: The patient  has                            taken Coumadin (warfarin), last dose was 5 days                            prior to procedure. ASA Grade Assessment: III - A                            patient with severe systemic disease. After                            reviewing the risks and benefits, the patient was                            deemed in satisfactory condition to undergo  the                            procedure.                           After obtaining informed consent, the colonoscope                            was passed under direct vision. Throughout the                            procedure, the patient's blood pressure, pulse, and                            oxygen saturations were monitored continuously. The                            CF-HQ190L ([id number]) Olympus colonoscope was                            introduced through the anus and advanced to the 3                            cm into the ileum. The colonoscopy was performed                            without difficulty. The patient tolerated the                            procedure well. The quality of the bowel                            preparation was good. The terminal ileum, ileocecal                            valve, appendiceal orifice, and rectum were  photographed. Scope In: 11:17:56 AM Scope Out: 11:30:13 AM Scope Withdrawal Time: 0 hours 8 minutes 50 seconds  Total Procedure Duration: 0 hours 12 minutes 17 seconds  Findings:      Non-bleeding external and internal hemorrhoids were found.      A 3 mm polyp was found in the transverse colon. The polyp was sessile.       The polyp was removed with a cold snare. Resection and retrieval were       complete. Estimated blood loss was minimal.      Multiple small and large-mouthed diverticula were found in the sigmoid       colon and descending colon.      The exam was otherwise without abnormality on direct and retroflexion       views. Impression:               - Non-bleeding external and internal hemorrhoids.                            Internal hemorrhoids are the likely source of                            recent bleeding.                           - One 3 mm polyp in the transverse colon, removed                            with a cold snare. Resected and retrieved.                           - The examination  was otherwise normal on direct                            and retroflexion views. Moderate Sedation:      Not Applicable - Patient had care per Anesthesia. Recommendation:           - Patient has a contact number available for                            emergencies. The signs and symptoms of potential                            delayed complications were discussed with the                            patient. Return to normal activities tomorrow.                            Written discharge instructions were provided to the                            patient.                           - Resume previous diet. High fiber diet recommended.                           -  Continue present medications. Supportive care of                            hemorrhoids with recurrent bleeding.                           - Resume warfarin per the Coumadin Clinic. May                            resume as early as tomorrow.                           - Await pathology results.                           - Repeat colonoscopy date to be determined after                            pending pathology results are reviewed for                            surveillance.                           - Emerging evidence supports eating a diet of                            fruits, vegetables, grains, calcium, and yogurt                            while reducing red meat and alcohol may reduce the                            risk of colon cancer. Procedure Code(s):        --- Professional ---                           [phone], Colonoscopy, flexible; with removal of                            tumor(s), polyp(s), or other lesion(s) by snare                            technique Diagnosis Code(s):        --- Professional ---                           K63.5, Polyp of colon                           K64.8, Other hemorrhoids                           K62.5, Hemorrhage of anus and rectum CPT copyright 2019 [employer].  All rights reserved. The codes documented in this report are preliminary and upon coder review may  be revised to meet current  compliance requirements. Thornton Park MD, MD 06/05/2021 11:46:43 AM This report has been signed electronically. Number of Addenda: 0

## 2021-06-05 NOTE — Anesthesia Postprocedure Evaluation (Signed)
Anesthesia Post Note  Patient: Kenneth Ryan  Procedure(s) Performed: COLONOSCOPY WITH PROPOFOL ESOPHAGOGASTRODUODENOSCOPY (EGD) WITH PROPOFOL BIOPSY     Patient location during evaluation: Endoscopy Anesthesia Type: MAC Level of consciousness: awake and alert Pain management: pain level controlled Vital Signs Assessment: post-procedure vital signs reviewed and stable Respiratory status: spontaneous breathing, nonlabored ventilation, respiratory function stable and patient connected to nasal cannula oxygen Cardiovascular status: blood pressure returned to baseline and stable Postop Assessment: no apparent nausea or vomiting Anesthetic complications: no   No notable events documented.  Last Vitals:  Vitals:   06/05/21 1155 06/05/21 1201  BP: (!) 147/62 105/80  Pulse: 80   Resp: 14   Temp:    SpO2: 100%     Last Pain:  Vitals:   06/05/21 1155  TempSrc:   PainSc: 0-No pain                 Barnet Glasgow

## 2021-06-05 NOTE — H&P (Signed)
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  Past cholecystectomy.  He is referred by Dr. Julien Nordmann for further evaluation regarding rectal bleeding.  He describes seeing bright red blood on the toilet tissue and in the toilet water which was separate from his bowel movement 4-6 times over a 2 week period one month ago. No further rectal bleeding for the past 2 weeks. He is on ASA and Coumadin.  He occasionally strains to pass a bowel movement.  His bowel pattern varies.  He can pass a normal formed brown stool, a soft stool or loose stool on any given day.  He possibly had looser stools on the days he had rectal bleeding.  No associated anal rectal pain.  No abdominal pain.  He underwent a colonoscopy while living in [place] in 2003 and in 2013 which he reported were normal, no polyps.  He takes Pantoprazole 40 mg once daily.  No dysphagia or heartburn.  No upper abdominal pain.  He was last seen by his cardiologist Dr. [name] on 11/05/2020.  At that time, his cardiac status was stable.  A repeat echo on 11/28/2020 showed LVEF 50 to 55% which was up from a prior echo which showed LVEF of 45%.  He reports having a chronic chest wall pain since his AVR and MV repair surgery.  He stated this pain is superficial and is not new.  Otherwise, no chest pain.  No palpitations, dizziness or shortness of breath.  He has lower extremity edema for which she takes Lasix.  No other complaints at this time.  CBC Latest Ref Rng & Units 03/10/2021 11/04/2020 10/18/2019  WBC 4.0 - 10.5 K/uL 4.8 5.6 4.7  Hemoglobin 13.0 - 17.0 g/dL 14.2 15.0 14.4  Hematocrit 39.0 -  52.0 % 41.3 43.7 42.5  Platelets 150.0 - 400.0 K/uL 189.0 202.0 181.0    ECHO 11/28/2020: 1. Left ventricular ejection fraction, by estimation, is 50 to 55%. The left ventricle has low normal function. The left ventricle has no regional wall motion abnormalities. The left ventricular internal cavity size was mildly dilated. Left ventricular diastolic parameters were normal. 2. Right ventricular systolic function is normal. The right ventricular size is normal. 3. Left atrial size was moderately dilated. 4. The mitral valve is degenerative. No evidence of mitral valve regurgitation. Mild mitral stenosis. Moderate mitral annular calcification. 5. Post AVR mechanical bi leaflet stable gradients since 2018 would bring patient back for further interrogation of color flow. See image 68 cannot tell if moderate PVL vs mitral inflow similar appearance in 2018 PVL not appreciated in PSL views but prone to get shadowing artifact in these views . The aortic valve has been repaired/replaced. Aortic valve regurgitation see below. No aortic stenosis is present. 6. The inferior vena cava is normal in size with greater than 50% respiratory variability, suggesting right atrial pressure of 3 mmHg.   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   Basal cell carcinoma    nose   Chronic hoarseness  secondary to hx of laryngeal surgery   Gallstones    GERD (gastroesophageal reflux disease)    Headache    History of tobacco abuse    Hypertriglyceridemia    IFG (impaired fasting glucose) 2018   A1c 5.4-5.6. 5.8% Nov 2021   Increased prostate specific antigen (PSA) velocity 09/2016   saw Dr. Jeffie Pollock 11/18/16: plan is to repeat PSA 6 wks and if still same/up then plan TRUS+ bx.  PSA's declined on recheck x 2 and as of 03/10/17 was 2.5.  Recommended annual PSA's with me, f/u  with urol prn.  PSA velocity inc 09/2018, repeat PSA 12/07/18 PSA trending up again->to urol->repeats Jan and Apr 2020 trending down, up into ULN range 09/2019->f/u PSA 6 mo   Laryngeal cancer [facility]) 2002   Surgery and radiation---He had post treatment surveillance x 12 yrs and was officially "released" from f/u in summer 2014.   Lesion of vocal cord    Mitral regurgitation    severe by TEE 08/2016:  MV repair done 11/03/16   Mitral valve prolapse    MV repair 10/2016   Nonischemic cardiomyopathy ([place]) 2018   dilated LV, systolic dysfunction: see echo results in Surgical hx section, 01/2017 and 05/2017.   S/P Bentall aortic root replacement with bileaflet mechanical prosthetic valve conduit 11/03/2016   Size 27/29 mm On-X bileaflet mechanical valve and synthetic root conduit (coumadin and ASA 35m as per cardiology rec's) with reimplantation of left main and right coronary arteries.  Per CV surg, as of 02/16/17, his INR goal is 1.5-2.5.   s/p [place] procedure [place] 11/03[place]2016   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   Complex valvuloplasty including artificial Gore-tex neochord placement x6 and 28 mm Sorin Memo 3D ring annuloplasty   Past Surgical History:  Procedure Laterality Date   BENTALL PROCEDURE N/A 11/03/2016   Procedure: BENTALL PROCEDURE AORTIC ROOT REPLACEMENT + mechanical AV;  Surgeon: CRexene Alberts MD;  [facility]  Service: Open Heart Surgery;  Laterality: N/A;   CARDIAC CATHETERIZATION N/A 09/29/2016   Clean coronaries, EF 60-65%.  Procedure: Right/Left Heart Cath and Coronary Angiography;  Surgeon: JJettie Booze MD;  [facility];  Service: Cardiovascular;  Laterality: N/A;   CHOLECYSTECTOMY  1980   COLONOSCOPY  2003 and 2013   Both normal.   DIRECT LARYNGOSCOPY Left 10/17/2015   Procedure: MICRO DIRECT LARYNGOSCOPY WITH FROZEN SECTION;  Surgeon: KJodi Marble MD;  [facility]  Service: ENT;  Laterality: Left;   MITRAL  VALVE REPAIR N/A 11/03/2016   Procedure: MITRAL VALVE REPAIR (MVR);  Surgeon: CRexene Alberts MD;  [facility]  Service: Open Heart Surgery;  Laterality: N/A;   PERIPHERAL VASCULAR CATHETERIZATION N/A 09/29/2016   Procedure: Abdominal Aortogram;  Surgeon: JJettie Booze MD;  [facility];  Service: Cardiovascular;  Laterality: N/A;   PERIPHERAL VASCULAR CATHETERIZATION N/A 09/29/2016   Procedure: Thoracic Aortogram;  Surgeon: JJettie Booze MD;  [facility];  Service: Cardiovascular;  Laterality: N/A;   TEE WITHOUT CARDIOVERSION N/A 09/17/2016   Procedure: TRANSESOPHAGEAL ECHOCARDIOGRAM (TEE);  Surgeon: MJerline Pain MD;  [facility]  Service: Cardiovascular;  Laterality: N/A;   TEE WITHOUT CARDIOVERSION N/A 11/03/2016   Procedure: TRANSESOPHAGEAL ECHOCARDIOGRAM (TEE);  Surgeon: CRexene Alberts MD;  [facility]  Service: Open Heart Surgery;  Laterality: N/A;   TONSILECTOMY/ADENOIDECTOMY WITH MYRINGOTOMY  1960   TRANSESOPHAGEAL ECHOCARDIOGRAM  09/17/2016   Severe MR  with ruptured chordae.  Mod/severe AR w/functional bicuspid AV.  EF 45%.   TRANSTHORACIC ECHOCARDIOGRAM  07/2015   Bicuspid aortic valve + mod mitral regurg   TRANSTHORACIC ECHOCARDIOGRAM  02/01/2017   (Post aortic root/valve surgery): EF 35%, diffuse hypokinesis, LV dilatation, grade I DD. Cardiologist added ARB and increased his BB dose.  Lasix prn added 06/2017 due to some LE edema.   TRANSTHORACIC ECHOCARDIOGRAM  05/2017; 11/28/20   05/2017 LVEF 45-50%.  Grd II DD.  Mechanical AV good, MV repair good. 11/2020 EF 83-38%, no diastolic dysfxn, mech AV good, MV repair good.   Vocal cord biopsy  09/2015   No atypia or inflammation.  Some fibrous changes that are possibly from hx of radiation therapy.    Father History: Father died age 78 history of heart disease, acute appendicitis. Mother deceased 77 secondary to multiple myeloma.  Sisters x 2 with breast cancer.  Brother with pre-diabetes.   Social History: He is widowed.  He is retired.  He smoked cigarettes 1ppd 15 to 20 years, quit smoking in the 1980's. He drinks 3 beers and 2 shots once weekly. No drug use.   No Known Allergies    Outpatient Encounter Medications as of 03/20/2021  Medication Sig   acetaminophen (TYLENOL) 500 MG tablet Take 500-1,000 mg by mouth every 6 (six) hours as needed for mild pain (depends on pain level if takes 500 mg or 1000 mg).   aspirin EC 81 MG EC tablet Take 1 tablet (81 mg total) by mouth daily.   fenofibrate (TRICOR) 145 MG tablet TAKE 1 TABLET BY MOUTH EVERY DAY   furosemide (LASIX) 20 MG tablet Take 1 tablet (20 mg total) by mouth daily as needed for fluid or edema (2 or more pounds).   irbesartan (AVAPRO) 300 MG tablet Take 1 tablet (300 mg total) by mouth daily.   metoprolol succinate (TOPROL-XL) 50 MG 24 hr tablet Take 1 tablet (50 mg total) by mouth daily.   Multiple Vitamins-Minerals (MULTIVITAMIN PO) Take 1 tablet by mouth daily.    pantoprazole (PROTONIX) 40 MG tablet TAKE 1 TABLET BY MOUTH EVERY DAY   warfarin (COUMADIN) 5 MG tablet Take As Directed By Coumadin Clinic   amoxicillin (AMOXIL) 500 MG capsule TAKE 4 CAPSULES BY MOUTH 1 HOUR PRIOR TO APPOINTMENT (Patient not taking: No sig reported)   No facility-administered encounter medications on file as of 03/20/2021.    REVIEW OF SYSTEMS:  Gen: Denies fever, sweats or chills. No weight loss.  CV: See HPI. Resp: Denies cough, shortness of breath of hemoptysis.  GI: See HPI. GU : Denies urinary burning, blood in urine, increased urinary frequency or incontinence. MS: Denies joint pain, muscles aches or weakness. Derm: Denies rash, itchiness, skin lesions or unhealing ulcers. Psych: Denies depression, anxiety memory loss. Heme: Denies bruising, bleeding. Neuro:  Denies headaches, dizziness or paresthesias. Endo:  Denies any problems with DM, thyroid or adrenal function.  PHYSICAL EXAM: Ht 5' 11"   (1.803 m)   Wt 97.5 kg   BMI 29.99 kg/m   Ht 5' 11"  (1.803 m)   Wt 97.5 kg   BMI 29.99 kg/m   General: 75 year old male in no acute distress. Head: Normocephalic and atraumatic. Eyes:  Sclerae non-icteric, conjunctive pink. Ears: Normal auditory acuity. Mouth: Dentition intact. No ulcers or lesions.  Neck: Supple, no lymphadenopathy or thyromegaly.  Lungs: Clear bilaterally to auscultation without wheezes, crackles or rhonchi. Heart: Regular rate and rhythm. No murmur, rub or gallop appreciated.  Abdomen:  Soft, nontender, non distended. No masses. No hepatosplenomegaly. Normoactive bowel sounds x 4 quadrants.  Rectal: Patient declined exam, he elected to proceed with a colonoscopy for further evaluation.  Musculoskeletal: Symmetrical with no gross deformities. Skin: Warm and dry. No rash or lesions on visible extremities. Extremities: Bilateral LEs with 1+ edema.  Neurological: Alert oriented x 4, no focal deficits.  Psychological:  Alert and cooperative. Normal mood and affect.  ASSESSMENT AND PLAN:  46. 75 year old male s/p AVR (mechanical valve) and MV repair 10/2016 on Warfarin and ASA presents for further evaluation regarding rectal bleeding. Hg 14.2.  -Colonoscopy benefits and risks discussed including risk with sedation, risk of bleeding, perforation and infection   2.  History of GERD, stable on Pantoprazole 40 mg once daily - EGD recommended given the chronicity of symptoms  3. History of laryngeal cancer s/p surgery and radiation in 2002  4. Variable bowel pattern, occasional constipation or loose stools -Benefiber 1 tablespoon daily as tolerated       CC:  No ref. provider found

## 2021-06-05 NOTE — Discharge Instructions (Signed)
YOU HAD AN ENDOSCOPIC PROCEDURE TODAY: Refer to the procedure report and other information in the discharge instructions given to you for any specific questions about what was found during the examination. If this information does not answer your questions, please call [facility] office at [phone] to clarify.  ° °YOU SHOULD EXPECT: Some feelings of bloating in the abdomen. Passage of more gas than usual. Walking can help get rid of the air that was put into your GI tract during the procedure and reduce the bloating. If you had a lower endoscopy (such as a colonoscopy or flexible sigmoidoscopy) you may notice spotting of blood in your stool or on the toilet paper. Some abdominal soreness may be present for a day or two, also. ° °DIET: Your first meal following the procedure should be a light meal and then it is ok to progress to your normal diet. A half-sandwich or bowl of soup is an example of a good first meal. Heavy or fried foods are harder to digest and may make you feel nauseous or bloated. Drink plenty of fluids but you should avoid alcoholic beverages for 24 hours. If you had a esophageal dilation, please see attached instructions for diet.   ° °ACTIVITY: Your care partner should take you home directly after the procedure. You should plan to take it easy, moving slowly for the rest of the day. You can resume normal activity the day after the procedure however YOU SHOULD NOT DRIVE, use power tools, machinery or perform tasks that involve climbing or major physical exertion for 24 hours (because of the sedation medicines used during the test).  ° °SYMPTOMS TO REPORT IMMEDIATELY: °A gastroenterologist can be reached at any hour. Please call [phone]  for any of the following symptoms:  °Following lower endoscopy (colonoscopy, flexible sigmoidoscopy) °Excessive amounts of blood in the stool  °Significant tenderness, worsening of abdominal pains  °Swelling of the abdomen that is new, acute  °Fever of 100° or  higher  °Following upper endoscopy (EGD, EUS, ERCP, esophageal dilation) °Vomiting of blood or coffee ground material  °New, significant abdominal pain  °New, significant chest pain or pain under the shoulder blades  °Painful or persistently difficult swallowing  °New shortness of breath  °Black, tarry-looking or red, bloody stools ° °FOLLOW UP:  °If any biopsies were taken you will be contacted by phone or by letter within the next 1-3 weeks. Call [phone]  if you have not heard about the biopsies in 3 weeks.  °Please also call with any specific questions about appointments or follow up tests. ° °

## 2021-06-05 NOTE — Transfer of Care (Signed)
Immediate Anesthesia Transfer of Care Note  Patient: Kenneth Ryan  Procedure(s) Performed: COLONOSCOPY WITH PROPOFOL ESOPHAGOGASTRODUODENOSCOPY (EGD) WITH PROPOFOL BIOPSY  Patient Location: PACU and Endoscopy Unit  Anesthesia Type:MAC  Level of Consciousness: awake, alert  and oriented  Airway & Oxygen Therapy: Patient Spontanous Breathing and Patient connected to face mask  Post-op Assessment: Report given to RN and Post -op Vital signs reviewed and stable  Post vital signs: Reviewed and stable  Last Vitals:  Vitals Value Taken Time  BP    Temp    Pulse 88 06/05/21 1138  Resp 14 06/05/21 1138  SpO2 95 % 06/05/21 1138  Vitals shown include unvalidated device data.  Last Pain:  Vitals:   06/05/21 0941  TempSrc: Oral  PainSc: 0-No pain         Complications: No notable events documented.

## 2021-06-05 NOTE — Anesthesia Procedure Notes (Addendum)
Procedure Name: MAC Date/Time: 06/05/2021 10:54 AM Performed by: West Pugh, CRNA Pre-anesthesia Checklist: Patient identified, Emergency Drugs available, Suction available, Patient being monitored and Timeout performed Patient Re-evaluated:Patient Re-evaluated prior to induction Oxygen Delivery Method: Simple face mask Placement Confirmation: positive ETCO2

## 2021-06-05 NOTE — Op Note (Signed)
[facility] Patient Name: Kenneth Ryan Procedure Date: 06/05/2021 MRN: [id number] Attending MD: Thornton Park MD, MD Date of Birth: [birth year] CSN: [id number] Age: 75 Admit Type: Outpatient Procedure:                Upper GI endoscopy Indications:              Exclusion of esophageal reflux Providers:                Thornton Park MD, MD, Nelia Shi, RN,                            Fransico Setters Mbumina, Technician Referring MD:              Medicines:                Monitored Anesthesia Care Complications:            No immediate complications. Estimated blood loss:                            Minimal. Estimated Blood Loss:     Estimated blood loss was minimal. Procedure:                Pre-Anesthesia Assessment:                           - Prior to the procedure, a History and Physical                            was performed, and patient medications and                            allergies were reviewed. The patient's tolerance of                            previous anesthesia was also reviewed. The risks                            and benefits of the procedure and the sedation                            options and risks were discussed with the patient.                            All questions were answered, and informed consent                            was obtained. Prior Anticoagulants: The patient has                            taken Coumadin (warfarin), last dose was 5 days                            prior to procedure. ASA Grade Assessment: III - A  patient with severe systemic disease. After                            reviewing the risks and benefits, the patient was                            deemed in satisfactory condition to undergo the                            procedure.                           After obtaining informed consent, the endoscope was                            passed under direct vision. Throughout  the                            procedure, the patient's blood pressure, pulse, and                            oxygen saturations were monitored continuously. The                            GIF-H190 ([id number]) Olympus gastroscope was                            introduced through the mouth, and advanced to the                            third part of duodenum. The upper GI endoscopy was                            accomplished without difficulty. The patient                            tolerated the procedure well. Scope In: Scope Out: Findings:      The esophagus and gastroesophageal junction were examined with white       light. There were esophageal mucosal changes suspicious for       short-segment Barrett's esophagus. These changes involved the mucosa at       the upper extent of the gastric folds (36 cm from the incisors)       extending to the Z-line (34 cm from the incisors). Tongues of       salmon-colored mucosa were present. The maximum longitudinal extent of       these esophageal mucosal changes was 2 cm in length. Mucosa was biopsied       with a cold forceps for histology in 4 quadrants at intervals of 1 cm.       One specimen bottle was sent to pathology. Estimated blood loss was       minimal.      Diffuse moderate inflammation characterized by erythema, friability and       granularity was found in the gastric body and in the gastric antrum.  Biopsies were taken with a cold forceps for histology. Estimated blood       loss was minimal.      Multiple sessile polyps with no stigmata of recent bleeding were found       in the gastric body. Biopsies were taken with a cold forceps for       histology. Estimated blood loss was minimal.      The examined duodenum was normal. Impression:               - Esophageal mucosal changes suspicious for                            short-segment Barrett's esophagus. Biopsied.                           - Gastritis. Biopsied.                            - Multiple gastric polyps. Biopsied.                           - Normal examined duodenum. Moderate Sedation:      Not Applicable - Patient had care per Anesthesia. Recommendation:           - Patient has a contact number available for                            emergencies. The signs and symptoms of potential                            delayed complications were discussed with the                            patient. Return to normal activities tomorrow.                            Written discharge instructions were provided to the                            patient.                           - Resume previous diet.                           - Continue present medications including                            pantoprazole 40 mg QAM.                           - Resume warfarin as instructed by the Coumadin                            Clinic. May resume as early as tomorrow.                           -  Await pathology results.                           - No aspirin, ibuprofen, naproxen, or other                            non-steroidal anti-inflammatory drugs. Procedure Code(s):        --- Professional ---                           [phone], Esophagogastroduodenoscopy, flexible,                            transoral; with biopsy, single or multiple Diagnosis Code(s):        --- Professional ---                           K22.8, Other specified diseases of esophagus                           K29.70, Gastritis, unspecified, without bleeding                           K31.7, Polyp of stomach and duodenum CPT copyright 2019 [employer] Association. All rights reserved. The codes documented in this report are preliminary and upon coder review may  be revised to meet current compliance requirements. Thornton Park MD, MD 06/05/2021 11:41:05 AM This report has been signed electronically. Number of Addenda: 0

## 2021-06-06 ENCOUNTER — Encounter: Payer: Self-pay | Admitting: Family Medicine

## 2021-06-06 LAB — SURGICAL PATHOLOGY

## 2021-06-07 ENCOUNTER — Encounter: Payer: Self-pay | Admitting: Gastroenterology

## 2021-06-11 NOTE — Progress Notes (Signed)
Pathology results from the esophageal biopsies confirm Barrett's Esophagus.

## 2021-06-12 ENCOUNTER — Ambulatory Visit (INDEPENDENT_AMBULATORY_CARE_PROVIDER_SITE_OTHER): Payer: PPO | Admitting: Pharmacist

## 2021-06-12 ENCOUNTER — Other Ambulatory Visit: Payer: Self-pay

## 2021-06-12 DIAGNOSIS — Z5181 Encounter for therapeutic drug level monitoring: Secondary | ICD-10-CM | POA: Diagnosis not present

## 2021-06-12 DIAGNOSIS — Z954 Presence of other heart-valve replacement: Secondary | ICD-10-CM | POA: Diagnosis not present

## 2021-06-12 DIAGNOSIS — I719 Aortic aneurysm of unspecified site, without rupture: Secondary | ICD-10-CM

## 2021-06-12 DIAGNOSIS — Z9889 Other specified postprocedural states: Secondary | ICD-10-CM | POA: Diagnosis not present

## 2021-06-12 DIAGNOSIS — I7121 Aneurysm of the ascending aorta, without rupture: Secondary | ICD-10-CM

## 2021-06-12 LAB — POCT INR: INR: 2.2 (ref 2.0–3.0)

## 2021-06-12 NOTE — Patient Instructions (Signed)
Continue normal dose of warfarin 1 tablet daily except for 1/2 a tablet on Monday, Wednesday and Friday. Recheck INR 6 weeks. Coumadin Clinic [phone].

## 2021-07-24 ENCOUNTER — Ambulatory Visit (INDEPENDENT_AMBULATORY_CARE_PROVIDER_SITE_OTHER): Payer: PPO | Admitting: *Deleted

## 2021-07-24 ENCOUNTER — Other Ambulatory Visit: Payer: Self-pay

## 2021-07-24 DIAGNOSIS — Z9889 Other specified postprocedural states: Secondary | ICD-10-CM | POA: Diagnosis not present

## 2021-07-24 DIAGNOSIS — Z5181 Encounter for therapeutic drug level monitoring: Secondary | ICD-10-CM | POA: Diagnosis not present

## 2021-07-24 LAB — POCT INR: INR: 3 (ref 2.0–3.0)

## 2021-07-24 NOTE — Patient Instructions (Signed)
Description   Hold tomorrow and then continue to take 1 tablet daily except for 1/2 a tablet on Monday, Wednesday and Friday. Recheck INR in  4 weeks. [facility] [phone].

## 2021-08-01 ENCOUNTER — Other Ambulatory Visit: Payer: Self-pay | Admitting: Cardiology

## 2021-08-01 DIAGNOSIS — I10 Essential (primary) hypertension: Secondary | ICD-10-CM

## 2021-08-01 DIAGNOSIS — Z954 Presence of other heart-valve replacement: Secondary | ICD-10-CM

## 2021-08-01 DIAGNOSIS — I420 Dilated cardiomyopathy: Secondary | ICD-10-CM

## 2021-08-01 DIAGNOSIS — I719 Aortic aneurysm of unspecified site, without rupture: Secondary | ICD-10-CM

## 2021-08-01 DIAGNOSIS — Z9889 Other specified postprocedural states: Secondary | ICD-10-CM

## 2021-08-01 DIAGNOSIS — I7121 Aneurysm of the ascending aorta, without rupture: Secondary | ICD-10-CM

## 2021-08-21 ENCOUNTER — Ambulatory Visit (INDEPENDENT_AMBULATORY_CARE_PROVIDER_SITE_OTHER): Payer: PPO | Admitting: *Deleted

## 2021-08-21 ENCOUNTER — Other Ambulatory Visit: Payer: Self-pay

## 2021-08-21 DIAGNOSIS — Z9889 Other specified postprocedural states: Secondary | ICD-10-CM | POA: Diagnosis not present

## 2021-08-21 DIAGNOSIS — Z5181 Encounter for therapeutic drug level monitoring: Secondary | ICD-10-CM | POA: Diagnosis not present

## 2021-08-21 LAB — POCT INR: INR: 3 (ref 2.0–3.0)

## 2021-08-21 NOTE — Patient Instructions (Signed)
Description   Start taking warfarin 1/2 a tablet daily except for 1 tablet on Sunday, Tuesday and Thursday. Recheck INR in 3 weeks. Eat a serving of greens today. [facility] Clinic [phone].

## 2021-09-08 ENCOUNTER — Telehealth: Payer: Self-pay | Admitting: Cardiology

## 2021-09-08 NOTE — Telephone Encounter (Signed)
Patient states he tested positive for COVID on 09/12 or 09/13. He would like to know if he can still come in for his INR on 09/22. Please advise.

## 2021-09-08 NOTE — Telephone Encounter (Signed)
Called pt and pt is aware that as long as he has been symptom free for the last 5 days he can come to appt. Pt states he is still warm but can be from his dogs on his lap and has fatigue. Pt wishes to come next week and set appt for next week. He was advised to call with any changes or concerns.

## 2021-09-18 ENCOUNTER — Other Ambulatory Visit: Payer: Self-pay

## 2021-09-18 ENCOUNTER — Ambulatory Visit (INDEPENDENT_AMBULATORY_CARE_PROVIDER_SITE_OTHER): Payer: PPO

## 2021-09-18 DIAGNOSIS — Z5181 Encounter for therapeutic drug level monitoring: Secondary | ICD-10-CM

## 2021-09-18 DIAGNOSIS — Z9889 Other specified postprocedural states: Secondary | ICD-10-CM | POA: Diagnosis not present

## 2021-09-18 LAB — POCT INR: INR: 5.1 — AB (ref 2.0–3.0)

## 2021-09-18 NOTE — Patient Instructions (Signed)
Description   Hold tomorrow's dose and Saturday's dose and then Start taking warfarin 1/2 a tablet daily except for 1 tablet on Sunday, Tuesday and Thursday. Recheck INR in 1 week. Eat a serving of greens today. [facility]in Clinic [phone].

## 2021-09-25 ENCOUNTER — Other Ambulatory Visit: Payer: Self-pay

## 2021-09-25 ENCOUNTER — Ambulatory Visit (INDEPENDENT_AMBULATORY_CARE_PROVIDER_SITE_OTHER): Payer: PPO

## 2021-09-25 DIAGNOSIS — Z9889 Other specified postprocedural states: Secondary | ICD-10-CM | POA: Diagnosis not present

## 2021-09-25 DIAGNOSIS — Z5181 Encounter for therapeutic drug level monitoring: Secondary | ICD-10-CM

## 2021-09-25 LAB — POCT INR: INR: 2.5 (ref 2.0–3.0)

## 2021-09-25 NOTE — Patient Instructions (Signed)
Description   Continue taking warfarin 1/2 a tablet daily except for 1 tablet on Sunday, Tuesday and Thursday. Recheck INR in 2 week. Stay consistent with greens. [facility]madin Clinic [phone].

## 2021-10-09 ENCOUNTER — Ambulatory Visit (INDEPENDENT_AMBULATORY_CARE_PROVIDER_SITE_OTHER): Payer: PPO | Admitting: *Deleted

## 2021-10-09 ENCOUNTER — Other Ambulatory Visit: Payer: Self-pay

## 2021-10-09 DIAGNOSIS — Z9889 Other specified postprocedural states: Secondary | ICD-10-CM | POA: Diagnosis not present

## 2021-10-09 DIAGNOSIS — I7121 Aneurysm of the ascending aorta, without rupture: Secondary | ICD-10-CM

## 2021-10-09 DIAGNOSIS — Z954 Presence of other heart-valve replacement: Secondary | ICD-10-CM

## 2021-10-09 DIAGNOSIS — Z5181 Encounter for therapeutic drug level monitoring: Secondary | ICD-10-CM | POA: Diagnosis not present

## 2021-10-09 LAB — POCT INR: INR: 2.9 (ref 2.0–3.0)

## 2021-10-09 NOTE — Patient Instructions (Addendum)
Description   Today take 1/2 tablet and have some leafy veggies today then continue taking warfarin 1/2 tablet daily except for 1 tablet on Sunday, Tuesday and Thursday. Recheck INR in 3 week. Stay consistent with greens. Coumadin Clinic [phone].

## 2021-10-20 DIAGNOSIS — R972 Elevated prostate specific antigen [PSA]: Secondary | ICD-10-CM | POA: Diagnosis not present

## 2021-10-20 LAB — PSA
PSA: 4.58
PSA: 4.58

## 2021-10-27 DIAGNOSIS — N401 Enlarged prostate with lower urinary tract symptoms: Secondary | ICD-10-CM | POA: Diagnosis not present

## 2021-10-27 DIAGNOSIS — R972 Elevated prostate specific antigen [PSA]: Secondary | ICD-10-CM | POA: Diagnosis not present

## 2021-10-27 DIAGNOSIS — R351 Nocturia: Secondary | ICD-10-CM | POA: Diagnosis not present

## 2021-10-30 ENCOUNTER — Other Ambulatory Visit: Payer: Self-pay

## 2021-10-30 ENCOUNTER — Ambulatory Visit (INDEPENDENT_AMBULATORY_CARE_PROVIDER_SITE_OTHER): Payer: PPO | Admitting: *Deleted

## 2021-10-30 DIAGNOSIS — Z5181 Encounter for therapeutic drug level monitoring: Secondary | ICD-10-CM

## 2021-10-30 DIAGNOSIS — I7121 Aneurysm of the ascending aorta, without rupture: Secondary | ICD-10-CM | POA: Diagnosis not present

## 2021-10-30 DIAGNOSIS — Z9889 Other specified postprocedural states: Secondary | ICD-10-CM

## 2021-10-30 DIAGNOSIS — Z954 Presence of other heart-valve replacement: Secondary | ICD-10-CM

## 2021-10-30 LAB — POCT INR: INR: 2.9 (ref 2.0–3.0)

## 2021-10-30 NOTE — Patient Instructions (Signed)
Description   Today take 1/2 tablet and have some leafy veggies today then start taking warfarin 1/2 tablet daily except for 1 tablet on Sunday and Thursday. Recheck INR in 3 weeks. Stay consistent with greens. Coumadin Clinic [phone].

## 2021-11-06 ENCOUNTER — Encounter: Payer: Self-pay | Admitting: Family Medicine

## 2021-11-06 ENCOUNTER — Other Ambulatory Visit: Payer: Self-pay

## 2021-11-06 ENCOUNTER — Ambulatory Visit (INDEPENDENT_AMBULATORY_CARE_PROVIDER_SITE_OTHER): Payer: PPO | Admitting: Family Medicine

## 2021-11-06 VITALS — BP 109/62 | HR 55 | Temp 97.9°F | Ht 72.0 in | Wt 217.2 lb

## 2021-11-06 DIAGNOSIS — Z0000 Encounter for general adult medical examination without abnormal findings: Secondary | ICD-10-CM | POA: Diagnosis not present

## 2021-11-06 DIAGNOSIS — E782 Mixed hyperlipidemia: Secondary | ICD-10-CM | POA: Diagnosis not present

## 2021-11-06 DIAGNOSIS — R7301 Impaired fasting glucose: Secondary | ICD-10-CM | POA: Diagnosis not present

## 2021-11-06 DIAGNOSIS — I10 Essential (primary) hypertension: Secondary | ICD-10-CM

## 2021-11-06 LAB — COMPREHENSIVE METABOLIC PANEL
ALT: 18 U/L (ref 0–53)
AST: 26 U/L (ref 0–37)
Albumin: 4.1 g/dL (ref 3.5–5.2)
Alkaline Phosphatase: 43 U/L (ref 39–117)
BUN: 15 mg/dL (ref 6–23)
CO2: 31 mEq/L (ref 19–32)
Calcium: 9.6 mg/dL (ref 8.4–10.5)
Chloride: 106 mEq/L (ref 96–112)
Creatinine, Ser: 1.07 mg/dL (ref 0.40–1.50)
GFR: 68.86 mL/min (ref >60.00)
Glucose, Bld: 104 mg/dL — ABNORMAL HIGH (ref 70–99)
Potassium: 4.2 mEq/L (ref 3.5–5.1)
Sodium: 142 mEq/L (ref 135–145)
Total Bilirubin: 0.8 mg/dL (ref 0.2–1.2)
Total Protein: 6.5 g/dL (ref 6.0–8.3)

## 2021-11-06 LAB — LIPID PANEL
Cholesterol: 164 mg/dL (ref 0–200)
HDL: 41.6 mg/dL (ref >39.00)
LDL Cholesterol: 96 mg/dL (ref 0–99)
NonHDL: 122.28
Total CHOL/HDL Ratio: 4
Triglycerides: 133 mg/dL (ref 0.0–149.0)
VLDL: 26.6 mg/dL (ref 0.0–40.0)

## 2021-11-06 LAB — CBC WITH DIFFERENTIAL/PLATELET
Basophils Absolute: 0 10*3/uL (ref 0.0–0.1)
Basophils Relative: 0.6 % (ref 0.0–3.0)
Eosinophils Absolute: 0.2 10*3/uL (ref 0.0–0.7)
Eosinophils Relative: 3 % (ref 0.0–5.0)
HCT: 43 % (ref 39.0–52.0)
Hemoglobin: 14.2 g/dL (ref 13.0–17.0)
Lymphocytes Relative: 41.1 % (ref 12.0–46.0)
Lymphs Abs: 2.1 10*3/uL (ref 0.7–4.0)
MCHC: 33 g/dL (ref 30.0–36.0)
MCV: 87.8 fl (ref 78.0–100.0)
Monocytes Absolute: 0.4 10*3/uL (ref 0.1–1.0)
Monocytes Relative: 7.4 % (ref 3.0–12.0)
Neutro Abs: 2.4 10*3/uL (ref 1.4–7.7)
Neutrophils Relative %: 47.9 % (ref 43.0–77.0)
Platelets: 170 10*3/uL (ref 150.0–400.0)
RBC: 4.9 Mil/uL (ref 4.22–5.81)
RDW: 14.4 % (ref 11.5–15.5)
WBC: 5.1 10*3/uL (ref 4.0–10.5)

## 2021-11-06 LAB — HEMOGLOBIN A1C: Hgb A1c MFr Bld: 5.8 % (ref 4.6–6.5)

## 2021-11-06 NOTE — Progress Notes (Signed)
See student note for this encounter. I attested it. Signed:  Crissie Sickles, MD           11/06/2021

## 2021-11-06 NOTE — Patient Instructions (Signed)

## 2021-11-06 NOTE — Progress Notes (Signed)
Office Note 11/06/2021  CC:  Chief Complaint  Patient presents with   Annual Exam    Pt is not fasting     HPI:  Patient is a 75 y.o. male who is here for annual health maintenance exam and f/u HTN, hypertriglyceridemia, IFG.  1) HTN: Patient has not experienced any hypertensive symptoms (headaches, blurry vision) and is adherent to his medication (Metoprolol 50 mg qd and losartan 100 mg qd). He does not report any side effects.   2) Hypertriglyceridemia: Patient is tolerating and is compliant with fenofibrate 16m qd.   3) IFG: diet is okay. Mentions he is working on it.   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 Has not reported abnormal bleeding or blood in stool.   5) Seborrheic Keratosis: Patient says two new lesions appeared on the left side of his face some time ago. Has been blindly scratching/shaving at it. Mentioned irritation in that area.  6.) Prediabetes: Patient denies increased thirst or urination.  ROS negative (headache, blurry vision, blood in stool, abdominal pain).  INTERIM HX:  Per Dr. MAnitra Lauth Of note, evaluation of painless rectal bleeding in June this year showed some gastritis and gastric polyps as well as some internal and external hemorrhoids and 1 polyp.  It was felt the internal hemorrhoids were the source of his rectal bleeding.  Past Medical History:  Diagnosis Date   Aortic insufficiency due to bicuspid aortic valve    Mechanical valve placed 11/03/16   Aortic root aneurysm    Repaired 11/03/16.   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 5.8% Nov 2021    Increased prostate specific antigen (PSA) velocity 09/2016   saw Dr. WJeffie Pollock11/29/17: plan is to repeat PSA 6 wks and if still same/up then plan TRUS+ bx.  PSA's declined on recheck x 2 and as of 03/10/17 was 2.5.  Recommended annual PSA's with me, f/u with urol prn.  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   Lesion of vocal cord    Mitral regurgitation    severe by TEE 08/2016:  MV repair done 11/03/16   Mitral valve prolapse    MV repair 10/2016   [place]ischemic cardiomyopathy [place]   dilated LV, systolic dysfunction: see echo results in Surgical hx section, 01/2017 and 05/2017.   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  Per CV surg, as of 02/16/17, his INR goal is 1.5-2.5.   s/p [place]  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   Complex valvuloplasty including artificial Gore-tex neochord placement x6 and 28 mm Sorin Memo 3D ring annuloplasty    Past Surgical History:  Procedure Laterality Date   BENTALL PROCEDURE N/A 11/03/2016   Procedure: BENTALL PROCEDURE AORTIC ROOT REPLACEMENT + mechanical AV;  Surgeon: Rexene Alberts, MD;  Location: [place];  Service: Open Heart Surgery;  Laterality: N/A;   BIOPSY  06/05/2021   Procedure: BIOPSY;  Surgeon: Thornton Park, MD;  [facility];  Service: Gastroenterology;;   CARDIAC  CATHETERIZATION N/A 09/29/2016   Clean coronaries, EF 60-65%.  Procedure: Right/Left Heart Cath and Coronary Angiography;  Surgeon: Jettie Booze, MD;  Location: [place] [facility];  Service: Cardiovascular;  Laterality: N/A;   CHOLECYSTECTOMY  1980   COLONOSCOPY  2003 and 2013   Both normal.   COLONOSCOPY WITH PROPOFOL N/A 06/05/2021   diverticulosis descending and sigmoid, int+ext nonbleeding hemorr, 1 sessile seratted polyp w/out cytologic dysplasia. Procedure: COLONOSCOPY WITH PROPOFOL;  Surgeon: Thornton Park, MD;  [facility];  Service: Gastroenterology;  Laterality: N/A;   DIRECT LARYNGOSCOPY Left 10/17/2015   Procedure: MICRO DIRECT LARYNGOSCOPY WITH FROZEN SECTION;  Surgeon: Jodi Marble, MD;  Location: [place];  Service: ENT;  Laterality: Left;   ESOPHAGOGASTRODUODENOSCOPY (EGD) WITH PROPOFOL N/A 06/05/2021   Barrett's esoph.  Gastritis (h pylori neg), multiple benign gastric polyps. Procedure: ESOPHAGOGASTRODUODENOSCOPY (EGD) WITH PROPOFOL;  Surgeon: Thornton Park, MD;  [facility];  Service: Gastroenterology;  Laterality: N/A;   MITRAL VALVE REPAIR N/A 11/03/2016   Procedure: MITRAL VALVE REPAIR (MVR);  Surgeon: Rexene Alberts, MD;  Location: [place];  Service: Open Heart Surgery;  Laterality: N/A;   PERIPHERAL VASCULAR CATHETERIZATION N/A 09/29/2016   Procedure: Abdominal Aortogram;  Surgeon: Jettie Booze, MD;  Location: [place] [facility];  Service: Cardiovascular;  Laterality: N/A;   PERIPHERAL VASCULAR CATHETERIZATION N/A 09/29/2016   Procedure: Thoracic Aortogram;  Surgeon: Jettie Booze, MD;  Location: [place] [facility];  Service: Cardiovascular;  Laterality: N/A;   POLYPECTOMY  06/05/2021   Procedure: POLYPECTOMY;  Surgeon: Thornton Park, MD;  [facility];  Service: Gastroenterology;;   TEE WITHOUT CARDIOVERSION N/A 09/17/2016   Procedure: TRANSESOPHAGEAL ECHOCARDIOGRAM (TEE);  Surgeon: Jerline Pain, MD;  Location:  [place];  Service: Cardiovascular;  Laterality: N/A;   TEE WITHOUT CARDIOVERSION N/A 11/03/2016   Procedure: TRANSESOPHAGEAL ECHOCARDIOGRAM (TEE);  Surgeon: Rexene Alberts, MD;  Location: [place];  Service: Open Heart Surgery;  Laterality: N/A;   TONSILECTOMY/ADENOIDECTOMY WITH MYRINGOTOMY  1960   TRANSESOPHAGEAL ECHOCARDIOGRAM  09/17/2016   Severe MR with ruptured chordae.  Mod/severe AR w/functional bicuspid AV.  EF 45%.   TRANSTHORACIC ECHOCARDIOGRAM  07/2015   Bicuspid aortic valve + mod mitral regurg   TRANSTHORACIC ECHOCARDIOGRAM  02/01/2017   (Post aortic root/valve surgery): EF 35%, diffuse hypokinesis, LV dilatation, grade I DD. Cardiologist added ARB and increased his BB dose.  Lasix prn added 06/2017 due to some LE edema.   TRANSTHORACIC ECHOCARDIOGRAM  05/2017; 11/28/20   05/2017 LVEF 45-50%.  Grd II DD.  Mechanical AV good, MV repair good. 11/2020 EF 30-86%, no diastolic dysfxn, mech AV good, MV repair good.   Vocal cord biopsy  09/2015   No atypia or inflammation.  Some fibrous changes that are possibly from hx of radiation therapy.    Family History  Problem Relation Age of Onset   Heart disease Father    Breast cancer Mother    Breast  cancer Sister    Diabetes Brother    Kidney failure Son    Breast cancer Sister     Social History   Socioeconomic History   Marital status: Widowed    Spouse name: Not on file   Number of children: 1   Years of education: Not on file   Highest education level: Not on file  Occupational History   Occupation: retired  Tobacco Use   Smoking status: Former    Types: Cigarettes    Quit date: 07/22/1979    Years since quitting: 42.3   Smokeless tobacco: Never  Vaping Use   Vaping Use: Never used  Substance and Sexual Activity   Alcohol use: Yes    Comment: rare   Drug use: No   Sexual activity: Not on file  Other Topics Concern   Not on file  Social History Narrative   Widower (approx 2014-02-15), one son deceased 2011/02/15.    Relocated from [place] to [facility] 06/2013 to be near grandchildren.   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   Smoker 20 pack-yr hx: quit about 1980.   Alcohol: 3 bottles of beer per week avg.     Drugs: none         Social Determinants of Health   Financial Resource Strain: Low Risk    Difficulty of Paying Living Expenses: Not hard at all  Food Insecurity: No Food Insecurity   Worried About Charity fundraiser in the Last Year: Never true   Ran Out of Food in the Last Year: Never true  Transportation Needs: No Transportation Needs   Lack of Transportation (Medical): No   Lack of Transportation (Non-Medical): No  Physical Activity: Inactive   Days of Exercise per Week: 0 days   Minutes of Exercise per Session: 0 min  Stress: No Stress Concern Present   Feeling of Stress : Only a little  Social Connections: Moderately Isolated   Frequency of Communication with Friends and Family: More than three times a week   Frequency of Social Gatherings with Friends and Family: Once a week   Attends Religious Services: More than 4 times per year   Active Member of [employer] or Organizations: No   Attends Archivist Meetings: Never   Marital Status: Widowed  Human resources officer Violence: Not At Risk   Fear of Current or Ex-Partner: No   Emotionally Abused: No   Physically Abused: No   Sexually Abused: No    Outpatient Medications Prior to Visit  Medication Sig Dispense Refill   acetaminophen (TYLENOL) 500 MG tablet Take 500-1,000 mg by mouth every 6 (six) hours as needed for mild pain (depends on pain level if takes 500 mg or 1000 mg).     aspirin EC 81 MG EC tablet Take 1 tablet (81 mg total) by mouth daily.     fenofibrate (TRICOR) 145 MG tablet TAKE 1 TABLET BY MOUTH EVERY DAY 90 tablet 3   furosemide (LASIX) 20 MG tablet Take 1 tablet (20 mg total) by mouth daily as needed for fluid or edema (2 or more pounds). 30 tablet 1   irbesartan (AVAPRO) 300 MG tablet Take 1 tablet (300 mg total) by  mouth daily. 90 tablet 3   metoprolol succinate (TOPROL-XL) 50 MG 24 hr tablet Take 1 tablet (50 mg total) by mouth daily. 90 tablet 3   Multiple Vitamins-Minerals (MULTIVITAMIN PO) Take 1 tablet by mouth daily.      pantoprazole (PROTONIX) 40 MG tablet TAKE 1 TABLET BY  MOUTH EVERY DAY 90 tablet 1   Sodium Sulfate-Mag Sulfate-KCl (SUTAB) [phone] MG TABS Take 1 kit by mouth as directed. MANUFACTURER CODES BIN: [id number] PCN: CN GROUP: [id number] MEMBER ID: [id number] RUN AS SECONDARY INSURANCE 24 tablet 0   warfarin (COUMADIN) 5 MG tablet TAKE AS DIRECTED BY COUMADIN CLINIC 90 tablet 1   No facility-administered medications prior to visit.    No Known Allergies  ROS Per HPI  Objective; Vitals with BMI 11/06/2021 06/05/2021 06/05/2021  Height _0  - -  Weight 217 lbs 3 oz - -  BMI 86.76 - -  Systolic 720 947 096  Diastolic 62 80 62  Pulse 55 - 80    Physical Exam Constitutional:      Appearance: Normal appearance.  Cardiovascular:     Rate and Rhythm: Normal rate and regular rhythm.     Heart sounds: Normal heart sounds.  Pulmonary:     Effort: Pulmonary effort is normal.     Breath sounds: Normal breath sounds.  Abdominal:     General: Abdomen is flat. Bowel sounds are normal.     Palpations: Abdomen is soft.  Skin:    Findings: Lesion present.     Comments: Seborrheic Keratosis present on back and trunk. Two new lesions present on L cheek and L temple.  Neurological:     Mental Status: He is alert.    Pertinent labs:  Lab Results  Component Value Date   TSH 3.03 11/04/2020   Lab Results  Component Value Date   WBC 4.8 03/10/2021   HGB 14.2 03/10/2021   HCT 41.3 03/10/2021   MCV 86.0 03/10/2021   PLT 189.0 03/10/2021   Lab Results  Component Value Date   CREATININE 0.96 11/04/2020   BUN 13 11/04/2020   NA 138 11/04/2020   K 4.5 11/04/2020   CL 104 11/04/2020   CO2 29 11/04/2020   Lab Results  Component Value Date   ALT 17 11/04/2020   AST 25  11/04/2020   ALKPHOS 43 11/04/2020   BILITOT 0.7 11/04/2020   Lab Results  Component Value Date   CHOL 159 11/04/2020   Lab Results  Component Value Date   HDL 40.20 11/04/2020   Lab Results  Component Value Date   LDLCALC 89 11/04/2020   Lab Results  Component Value Date   TRIG 149.0 11/04/2020   Lab Results  Component Value Date   CHOLHDL 4 11/04/2020   Lab Results  Component Value Date   PSA 4.58 10/20/2021   PSA 3.45 09/25/2020   PSA 3.74 03/27/2020   Lab Results  Component Value Date   HGBA1C 5.8 11/04/2020   ASSESSMENT AND PLAN:   Kenneth Ryan is a non-toxic appearing male who presents today for his annual check up.  1) HTN: Stable at 109/62 today and the patient does not have any side effects to his current medication. Continue Avapro and Metoprolol. Getting electrolytes and creatinine today.  2) Hypertriglyceridemia: Stable based on last year lipid panel. Continue fenofibrate 159m qd. Will order FLP and hepatic panel today to monitor effectiveness.  3) IFG/prediabetes: Stable based on last HbA1c. Patient denies symptoms of diabetes (polydipsia, polyuria). Is continuing to work towards a better diet. Hba1c and fasting glucose today.  4) ) Mechanical AV: PT/INR normal (2.9) as of 10/30/21. Will order PT/INR in December. No sign of bleeding today. Continue Aspirin 81 mg and Warfarin 5 mg daily. CBC monitoring today."  5) Health maintenance exam: Reviewed age and gender appropriate health  maintenance issues (prudent diet, regular exercise, health risks of tobacco and excessive alcohol, use of seatbelts, fire alarms in home, use of sunscreen).  Also reviewed age and gender appropriate health screening as well as vaccine recommendations. Vaccines: All UTD. Labs: cbc, cmet, lipid, Hba1c Prostate ca screening: followed by urol for elevated PSA. Colon ca screening:  An After Visit Summary was printed and given to the patient.  Vaccines:All UTD including flu and covid  19. Labs: fasting HP + A1c ordered. PT/INR per coumadin clinic. Prostate ca screening: followed by urol for hx of increased PSA velocity->most recent PSA was 10/30/21 and was good, 4.58. Colon ca screening: next colonoscopy due 2023.  FOLLOW UP:  One month  Phil Dopp -MS3  Signed:  Crissie Sickles, MD           11/06/2021

## 2021-11-10 ENCOUNTER — Other Ambulatory Visit: Payer: Self-pay | Admitting: Family Medicine

## 2021-11-20 ENCOUNTER — Ambulatory Visit (INDEPENDENT_AMBULATORY_CARE_PROVIDER_SITE_OTHER): Payer: PPO

## 2021-11-20 ENCOUNTER — Other Ambulatory Visit: Payer: Self-pay | Admitting: Cardiology

## 2021-11-20 ENCOUNTER — Other Ambulatory Visit: Payer: Self-pay

## 2021-11-20 DIAGNOSIS — Z5181 Encounter for therapeutic drug level monitoring: Secondary | ICD-10-CM

## 2021-11-20 DIAGNOSIS — Z9889 Other specified postprocedural states: Secondary | ICD-10-CM | POA: Diagnosis not present

## 2021-11-20 LAB — POCT INR: INR: 2 (ref 2.0–3.0)

## 2021-11-20 NOTE — Patient Instructions (Signed)
Description   Continue taking warfarin 1/2 tablet daily except for 1 tablet on Sunday and Thursday. Recheck INR in 5 weeks. Stay consistent with greens. [facility] [phone].

## 2021-12-15 ENCOUNTER — Other Ambulatory Visit: Payer: Self-pay | Admitting: Cardiology

## 2021-12-19 ENCOUNTER — Other Ambulatory Visit: Payer: Self-pay

## 2021-12-19 ENCOUNTER — Telehealth: Payer: Self-pay | Admitting: Cardiology

## 2021-12-19 DIAGNOSIS — I10 Essential (primary) hypertension: Secondary | ICD-10-CM

## 2021-12-19 DIAGNOSIS — I420 Dilated cardiomyopathy: Secondary | ICD-10-CM

## 2021-12-19 DIAGNOSIS — Z9889 Other specified postprocedural states: Secondary | ICD-10-CM

## 2021-12-19 DIAGNOSIS — Q2543 Congenital aneurysm of aorta: Secondary | ICD-10-CM

## 2021-12-19 DIAGNOSIS — Z954 Presence of other heart-valve replacement: Secondary | ICD-10-CM

## 2021-12-19 DIAGNOSIS — I7121 Aneurysm of the ascending aorta, without rupture: Secondary | ICD-10-CM

## 2021-12-19 MED ORDER — PANTOPRAZOLE SODIUM 40 MG PO TBEC: 40.0000 mg | DELAYED_RELEASE_TABLET | Freq: Every day | ORAL | 0 refills | Status: DC

## 2021-12-19 MED ORDER — IRBESARTAN 300 MG PO TABS: 300.0000 mg | ORAL_TABLET | Freq: Every day | ORAL | 0 refills | Status: DC

## 2021-12-19 MED ORDER — METOPROLOL SUCCINATE ER 50 MG PO TB24: 50.0000 mg | ORAL_TABLET | Freq: Every day | ORAL | 0 refills | Status: DC

## 2021-12-19 MED ORDER — WARFARIN SODIUM 5 MG PO TABS: ORAL_TABLET | ORAL | 0 refills | Status: DC

## 2021-12-19 NOTE — Telephone Encounter (Signed)
°*  STAT* If patient is at the pharmacy, call can be transferred to refill team.   1. Which medications need to be refilled? (please list name of each medication and dose if known)  irbesartan (AVAPRO) 300 MG tablet  metoprolol succinate (TOPROL-XL) 50 MG 24 hr tablet  pantoprazole (PROTONIX) 40 MG tablet  warfarin (COUMADIN) 5 MG tablet  2. Which pharmacy/location (including street and city if local pharmacy) is medication to be sent to?[id number] - [place], [place] - [address]   3. Do they need a 30 day or 90 day supply? 90 ds

## 2021-12-21 ENCOUNTER — Other Ambulatory Visit: Payer: Self-pay | Admitting: Cardiology

## 2021-12-21 DIAGNOSIS — I10 Essential (primary) hypertension: Secondary | ICD-10-CM

## 2021-12-21 DIAGNOSIS — Z954 Presence of other heart-valve replacement: Secondary | ICD-10-CM

## 2021-12-21 DIAGNOSIS — I7121 Aneurysm of the ascending aorta, without rupture: Secondary | ICD-10-CM

## 2021-12-21 DIAGNOSIS — Q2543 Congenital aneurysm of aorta: Secondary | ICD-10-CM

## 2021-12-21 DIAGNOSIS — I420 Dilated cardiomyopathy: Secondary | ICD-10-CM

## 2021-12-21 DIAGNOSIS — Z9889 Other specified postprocedural states: Secondary | ICD-10-CM

## 2021-12-24 ENCOUNTER — Other Ambulatory Visit: Payer: Self-pay | Admitting: Cardiology

## 2021-12-25 ENCOUNTER — Ambulatory Visit (INDEPENDENT_AMBULATORY_CARE_PROVIDER_SITE_OTHER): Payer: PPO | Admitting: *Deleted

## 2021-12-25 ENCOUNTER — Other Ambulatory Visit: Payer: Self-pay

## 2021-12-25 DIAGNOSIS — Q2543 Congenital aneurysm of aorta: Secondary | ICD-10-CM

## 2021-12-25 DIAGNOSIS — I7121 Aneurysm of the ascending aorta, without rupture: Secondary | ICD-10-CM | POA: Diagnosis not present

## 2021-12-25 DIAGNOSIS — Z9889 Other specified postprocedural states: Secondary | ICD-10-CM | POA: Diagnosis not present

## 2021-12-25 DIAGNOSIS — Z5181 Encounter for therapeutic drug level monitoring: Secondary | ICD-10-CM | POA: Diagnosis not present

## 2021-12-25 DIAGNOSIS — Z954 Presence of other heart-valve replacement: Secondary | ICD-10-CM

## 2021-12-25 LAB — POCT INR: INR: 1.9 — AB (ref 2.0–3.0)

## 2021-12-25 NOTE — Patient Instructions (Signed)
Description   Today take 1.5 tablets then continue taking warfarin 1/2 tablet daily except for 1 tablet on Sunday and Thursday. Recheck INR in 5 weeks. Stay consistent with greens. Coumadin Clinic [phone].

## 2021-12-31 ENCOUNTER — Other Ambulatory Visit: Payer: Self-pay | Admitting: Cardiology

## 2022-01-01 ENCOUNTER — Other Ambulatory Visit: Payer: Self-pay | Admitting: Family Medicine

## 2022-01-01 NOTE — Telephone Encounter (Signed)
Patient calling back for refills. He has an appointment for 04/20/2022 and would like his 90 day supply refills sent in so he will have enough medication to make it to him appointment.

## 2022-01-01 NOTE — Telephone Encounter (Signed)
Returned call to pt to find out which medications he needed refilled.  Advised pt that he can call us back and let us know or he can have his pharmacy fax in a refill request.

## 2022-01-01 NOTE — Telephone Encounter (Signed)
Pt called needing refill on  Fenofibrate  fenofibrate (TRICOR) 145 MG tablet Informed pt, was he referring to furosemide, pt said no. He hardly takes those.  Told pt I see Dr. [name] sent in order today. Pt was told that he would only get a 7 day supply. Said that Dr.[name] prescribed him with it previously. I told pt i'll send a note back.  More than likely he'll need an appointment scheduled. Pt wanting to receive a call possibly tomorrow.  Pt cell: [phone]

## 2022-01-02 MED ORDER — FENOFIBRATE 145 MG PO TABS: ORAL_TABLET | ORAL | 3 refills | Status: DC

## 2022-01-02 NOTE — Telephone Encounter (Signed)
Pt advised refill sent. He has f/u scheduled with Dr.Skains for May. He would like next refill for pantoprazole, metoprolol, irbesartan from PCP instead of Dr.Skain and would like refills on file.  Last refill was completed 12/19/21(90,0)

## 2022-01-02 NOTE — Telephone Encounter (Signed)
Pt last seen 11/06/21 CPE with labs. Last rx completed by PCP 09/20/18 (90,1). Sig directions 1 tab po qd

## 2022-01-02 NOTE — Telephone Encounter (Signed)
Okay, I sent this prescription in today, 90-day supply, refill x3. Encourage him to follow-up with Dr. [name] as per their request.

## 2022-01-29 ENCOUNTER — Ambulatory Visit (INDEPENDENT_AMBULATORY_CARE_PROVIDER_SITE_OTHER): Payer: PPO

## 2022-01-29 ENCOUNTER — Other Ambulatory Visit: Payer: Self-pay

## 2022-01-29 DIAGNOSIS — Z954 Presence of other heart-valve replacement: Secondary | ICD-10-CM

## 2022-01-29 DIAGNOSIS — Z5181 Encounter for therapeutic drug level monitoring: Secondary | ICD-10-CM | POA: Diagnosis not present

## 2022-01-29 DIAGNOSIS — Z9889 Other specified postprocedural states: Secondary | ICD-10-CM

## 2022-01-29 DIAGNOSIS — I7121 Aneurysm of the ascending aorta, without rupture: Secondary | ICD-10-CM

## 2022-01-29 LAB — POCT INR: INR: 2.3 (ref 2.0–3.0)

## 2022-01-29 NOTE — Patient Instructions (Signed)
Description   Continue taking warfarin 1/2 tablet daily except for 1 tablet on Sunday and Thursday. Recheck INR in 6 weeks. Stay consistent with greens. [facility] [phone].

## 2022-03-12 ENCOUNTER — Other Ambulatory Visit: Payer: Self-pay

## 2022-03-12 ENCOUNTER — Ambulatory Visit (INDEPENDENT_AMBULATORY_CARE_PROVIDER_SITE_OTHER): Payer: PPO

## 2022-03-12 ENCOUNTER — Ambulatory Visit (INDEPENDENT_AMBULATORY_CARE_PROVIDER_SITE_OTHER): Payer: PPO | Admitting: *Deleted

## 2022-03-12 DIAGNOSIS — I7121 Aneurysm of the ascending aorta, without rupture: Secondary | ICD-10-CM

## 2022-03-12 DIAGNOSIS — Z0000 Encounter for general adult medical examination without abnormal findings: Secondary | ICD-10-CM

## 2022-03-12 DIAGNOSIS — Z9889 Other specified postprocedural states: Secondary | ICD-10-CM

## 2022-03-12 DIAGNOSIS — Z954 Presence of other heart-valve replacement: Secondary | ICD-10-CM

## 2022-03-12 DIAGNOSIS — Z5181 Encounter for therapeutic drug level monitoring: Secondary | ICD-10-CM

## 2022-03-12 LAB — POCT INR: INR: 2 (ref 2.0–3.0)

## 2022-03-12 NOTE — Patient Instructions (Addendum)
Description   ?Tomorrow take 1 tablet then continue taking warfarin 1/2 tablet daily except for 1 tablet on Sunday and Thursday. Recheck INR in 6 weeks. Stay consistent with greens. [facility]in Clinic [phone].  ?  ?  ? ?

## 2022-03-12 NOTE — Progress Notes (Addendum)
? ?Subjective:  ? Kenneth Ryan is a 76 y.o. male who presents for Medicare Annual/Subsequent preventive examination. I connected with  Kenneth Ryan on 03/12/22 by a audio enabled telemedicine application and verified that I am speaking with the correct person using two identifiers. ? ?Patient Location: Home ? ?Provider Location: Office/Clinic ? ?I discussed the limitations of evaluation and management by telemedicine. The patient expressed understanding and agreed to proceed.  ? ?Review of Systems    ?Defer tor PCP ?Cardiac Risk Factors include: dyslipidemia;advanced age (>81men, >35 women);male gender;obesity (BMI >30kg/m2) ? ?   ?Objective:  ?  ?There were no vitals filed for this visit. ?There is no height or weight on file to calculate BMI. ? ? ?  03/12/2022  ?  2:39 PM 06/05/2021  ?  9:34 AM 03/05/2021  ?  9:52 AM 03/04/2020  ? 12:55 PM 02/27/2019  ? 10:11 AM 02/21/2018  ? 10:23 AM 02/15/2017  ?  9:17 AM  ?Advanced Directives  ?Does Patient Have a Medical Advance Directive? Yes Yes Yes Yes No Yes Yes  ?Type of Paramedic of Kenneth Ryan;Living will Kenneth Ryan;Living will Kenneth Ryan;Living will Kenneth Ryan;Living will  Kenneth Ryan;Living will Living will;Healthcare Power of Attorney  ?Does patient want to make changes to medical advance directive? No - Patient declined        ?Copy of Kenneth Ryan in Chart? No - copy requested No - copy requested No - copy requested No - copy requested  No - copy requested No - copy requested  ?Would patient like information on creating a medical advance directive?     No - Patient declined    ? ? ?Current Medications (verified) ?Outpatient Encounter Medications as of 03/12/2022  ?Medication Sig  ? acetaminophen (TYLENOL) 500 MG tablet Take 500-1,000 mg by mouth every 6 (six) hours as needed for mild pain (depends on pain level if takes 500 mg or 1000 mg).  ? aspirin EC 81  MG EC tablet Take 1 tablet (81 mg total) by mouth daily.  ? fenofibrate (TRICOR) 145 MG tablet TAKE 1 TABLET BY MOUTH DAILY  ? furosemide (LASIX) 20 MG tablet Take 1 tablet (20 mg total) by mouth daily as needed for fluid or edema (2 or more pounds).  ? irbesartan (AVAPRO) 300 MG tablet Take 1 tablet (300 mg total) by mouth daily.  ? metoprolol succinate (TOPROL-XL) 50 MG 24 hr tablet Take 1 tablet (50 mg total) by mouth daily.  ? Multiple Vitamins-Minerals (MULTIVITAMIN PO) Take 1 tablet by mouth daily.   ? pantoprazole (PROTONIX) 40 MG tablet Take 1 tablet (40 mg total) by mouth daily.  ? Sodium Sulfate-Mag Sulfate-KCl (SUTAB) [phone] MG TABS Take 1 kit by mouth as directed. MANUFACTURER CODES BIN: [id number] PCN: CN GROUP: [id number] MEMBER ID: [id number] RUN AS SECONDARY INSURANCE  ? warfarin (COUMADIN) 5 MG tablet Take 1/2-1 tablet daily as directed by the coumadin clinic.  ? ?No facility-administered encounter medications on file as of 03/12/2022.  ? ? ?Allergies (verified) ?Patient has no known allergies.  ? ?History: ?Past Medical History:  ?Diagnosis Date  ? Aortic insufficiency due to bicuspid aortic valve   ? Mechanical valve placed 11/03/16  ? Aortic root aneurysm   ? Repaired 11/03/16.  ? Basal cell carcinoma   ? nose  ? BRBPR (bright red blood per rectum) 2022  ? 05/2021 colonoscopy->int hemorrhoids likely source  ? Chronic hoarseness   ?  secondary to hx of laryngeal surgery  ? Gallstones   ? GERD (gastroesophageal reflux disease)   ? 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 Headache   ? History of tobacco abuse   ? Hypertriglyceridemia   ? IFG (impaired fasting glucose) 2018  ? A1c 5.4-5.6. 5.8% Nov 2021  ? Increased prostate specific antigen (PSA) velocity 09/2016  ? saw Dr. Jeffie Pollock 11/18/16: plan is to repeat PSA 6 wks and if still same/up then plan TRUS+ bx.  PSA's declined on recheck x 2 and as of 03/10/17 was 2.5.  Recommended annual PSA's with me, f/u with urol prn.  PSA velocity inc 09/2018, repeat PSA  12/07/18 PSA trending up again->to urol->repeats Jan and Apr 2020 trending down, up into ULN range 09/2019->f/u PSA 6 mo  ? 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 Surgery and radiation---He had post treatment surveillance x 12 yrs and was officially "released" from f/u in summer 2014.  ? Lesion of vocal cord   ? Mitral regurgitation   ? severe by TEE 08/2016:  MV repair done 11/03/16  ? Mitral valve prolapse   ? MV repair 10/2016  ? Nonischemic cardiomyopathy ([place]) 2018  ? dilated LV, systolic dysfunction: see echo results in Surgical hx section, 01/2017 and 05/2017.  ? Painless rectal bleeding 05/2021  ? EGD w/some gastritis, Colonoscopy with int/ext hemorrhoids, 1 polyp  ? 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 Size 27/29 mm On-X bileaflet mechanical valve and synthetic root conduit (coumadin and ASA 11m as per cardiology rec's) with reimplantation of left main and right coronary arteries.  Per CV surg, as of 02/16/17, his INR goal is 1.5-2.5.  ? s/[place]  ? 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  ? Complex valvuloplasty including artificial Gore-tex neochord placement x6 and 28 mm Sorin Memo 3D ring annuloplasty  ? ?Past Surgical History:  ?Procedure Laterality Date  ? BENTALL PROCEDURE N/A 11/03/2016  ? Procedure: BENTALL PROCEDURE AORTIC ROOT REPLACEMENT + mechanical AV;  Surgeon: CRexene Alberts MD;  [place] [facility]  Service: Open Heart Surgery;  Laterality: N/A;  ? BIOPSY  06/05/2021  ? Procedure: BIOPSY;  Surgeon: BThornton Park MD;  [facility];  Service: Gastroenterology;;  ? CARDIAC CATHETERIZATION N/A 09/29/2016  ? Clean coronaries, EF 60-65%.  Procedure: Right/Left Heart Cath and Coronary Angiography;  Surgeon: JJettie Booze MD;  [facility];  Service: Cardiovascular;  Laterality: N/A;  ? CHOLECYSTECTOMY  1980  ? COLONOSCOPY  2003 and 2013  ? Both normal.  ?  COLONOSCOPY WITH PROPOFOL N/A 06/05/2021  ? diverticulosis descending and sigmoid, int+ext nonbleeding hemorr, 1 sessile seratted polyp w/out cytologic dysplasia. Procedure: COLONOSCOPY WITH PROPOFOL;  Surgeon: BThornton Park MD;  [facility];  Service: Gastroenterology;  Laterality: N/A;  ? DIRECT LARYNGOSCOPY Left 10/17/2015  ? Procedure: MICRO DIRECT LARYNGOSCOPY WITH FROZEN SECTION;  Surgeon: KJodi Marble MD;  [facility] [facility]  Service: ENT;  Laterality: Left;  ? ESOPHAGOGASTRODUODENOSCOPY (EGD) WITH PROPOFOL N/A 06/05/2021  ? Barrett's esoph.  Gastritis (h pylori neg), multiple benign gastric polyps. Procedure: ESOPHAGOGASTRODUODENOSCOPY (EGD) WITH PROPOFOL;  Surgeon: BThornton Park MD;  [facility];  Service: Gastroenterology;  Laterality: N/A;  ? MITRAL VALVE REPAIR N/A 11/03/2016  ? Procedure: MITRAL VALVE REPAIR (MVR);  Surgeon: CRexene Alberts MD;  [place] [facility]  Service: Open Heart Surgery;  Laterality: N/A;  ? PERIPHERAL VASCULAR CATHETERIZATION N/A 09/29/2016  ?  Procedure: Abdominal Aortogram;  Surgeon: Jettie Booze, MD;  Location: [facility];  Service: Cardiovascular;  Laterality: N/A;  ? PERIPHERAL VASCULAR CATHETERIZATION N/A 09/29/2016  ? Procedure: Thoracic Aortogram;  Surgeon: Jettie Booze, MD;  Location: [facility];  Service: Cardiovascular;  Laterality: N/A;  ? POLYPECTOMY  06/05/2021  ? Procedure: POLYPECTOMY;  Surgeon: Thornton Park, MD;  [facility];  Service: Gastroenterology;;  ? TEE WITHOUT CARDIOVERSION N/A 09/17/2016  ? Procedure: TRANSESOPHAGEAL ECHOCARDIOGRAM (TEE);  Surgeon: Jerline Pain, MD;  Location: [facility];  Service: Cardiovascular;  Laterality: N/A;  ? TEE WITHOUT CARDIOVERSION N/A 11/03/2016  ? Procedure: TRANSESOPHAGEAL ECHOCARDIOGRAM (TEE);  Surgeon: Rexene Alberts, MD;  Location: [facility];  Service: Open Heart Surgery;  Laterality: N/A;  ? TONSILECTOMY/ADENOIDECTOMY WITH MYRINGOTOMY  1960  ?  TRANSESOPHAGEAL ECHOCARDIOGRAM  09/17/2016  ? 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  Mod/severe AR w/functional bicuspid AV.  EF 45%.  ? TRANSTHORACIC ECHOCARDIOGRAM  07/2015  ? Bicuspid aortic valve + mod mitr

## 2022-03-20 ENCOUNTER — Other Ambulatory Visit: Payer: Self-pay | Admitting: Cardiology

## 2022-03-20 DIAGNOSIS — Z9889 Other specified postprocedural states: Secondary | ICD-10-CM

## 2022-03-20 DIAGNOSIS — I10 Essential (primary) hypertension: Secondary | ICD-10-CM

## 2022-03-20 DIAGNOSIS — I420 Dilated cardiomyopathy: Secondary | ICD-10-CM

## 2022-03-20 DIAGNOSIS — I7121 Aneurysm of the ascending aorta, without rupture: Secondary | ICD-10-CM

## 2022-03-20 DIAGNOSIS — Z954 Presence of other heart-valve replacement: Secondary | ICD-10-CM

## 2022-04-20 ENCOUNTER — Ambulatory Visit: Payer: PPO | Admitting: Cardiology

## 2022-04-20 ENCOUNTER — Encounter: Payer: Self-pay | Admitting: Cardiology

## 2022-04-20 ENCOUNTER — Ambulatory Visit (INDEPENDENT_AMBULATORY_CARE_PROVIDER_SITE_OTHER): Payer: PPO | Admitting: *Deleted

## 2022-04-20 DIAGNOSIS — R42 Dizziness and giddiness: Secondary | ICD-10-CM

## 2022-04-20 DIAGNOSIS — Z5181 Encounter for therapeutic drug level monitoring: Secondary | ICD-10-CM

## 2022-04-20 DIAGNOSIS — Z954 Presence of other heart-valve replacement: Secondary | ICD-10-CM

## 2022-04-20 DIAGNOSIS — I420 Dilated cardiomyopathy: Secondary | ICD-10-CM | POA: Diagnosis not present

## 2022-04-20 DIAGNOSIS — I10 Essential (primary) hypertension: Secondary | ICD-10-CM | POA: Insufficient documentation

## 2022-04-20 DIAGNOSIS — R5383 Other fatigue: Secondary | ICD-10-CM | POA: Insufficient documentation

## 2022-04-20 DIAGNOSIS — Z9889 Other specified postprocedural states: Secondary | ICD-10-CM | POA: Diagnosis not present

## 2022-04-20 DIAGNOSIS — I7121 Aneurysm of the ascending aorta, without rupture: Secondary | ICD-10-CM

## 2022-04-20 DIAGNOSIS — Z7901 Long term (current) use of anticoagulants: Secondary | ICD-10-CM

## 2022-04-20 DIAGNOSIS — E782 Mixed hyperlipidemia: Secondary | ICD-10-CM

## 2022-04-20 LAB — BASIC METABOLIC PANEL
BUN/Creatinine Ratio: 14 (ref 10–24)
BUN: 15 mg/dL (ref 8–27)
CO2: 24 mmol/L (ref 20–29)
Calcium: 9.3 mg/dL (ref 8.6–10.2)
Chloride: 107 mmol/L — ABNORMAL HIGH (ref 96–106)
Creatinine, Ser: 1.08 mg/dL (ref 0.76–1.27)
Glucose: 94 mg/dL (ref 70–99)
Potassium: 4.3 mmol/L (ref 3.5–5.2)
Sodium: 142 mmol/L (ref 134–144)
eGFR: 72 mL/min/{1.73_m2} (ref >59)

## 2022-04-20 LAB — CBC
Hematocrit: 39.3 % (ref 37.5–51.0)
Hemoglobin: 13.5 g/dL (ref 13.0–17.7)
MCH: 29 pg (ref 26.6–33.0)
MCHC: 34.4 g/dL (ref 31.5–35.7)
MCV: 85 fL (ref 79–97)
Platelets: 235 10*3/uL (ref 150–450)
RBC: 4.65 x10E6/uL (ref 4.14–5.80)
RDW: 12.2 % (ref 11.6–15.4)
WBC: 5.4 10*3/uL (ref 3.4–10.8)

## 2022-04-20 LAB — TSH: TSH: 2.7 u[IU]/mL (ref 0.450–4.500)

## 2022-04-20 LAB — POCT INR: INR: 2.8 (ref 2.0–3.0)

## 2022-04-20 NOTE — Assessment & Plan Note (Signed)
Mechanical aortic valve, new or addition that allows for INR of 2-2.5.  Continue to monitor closely in Coumadin clinic.  Recent reference values reviewed. ?

## 2022-04-20 NOTE — Assessment & Plan Note (Addendum)
Ejection fraction originally 35%, improved to 45%.  Eventually improved to 50 to 55%.  Echocardiogram reviewed. ? ?With his mechanical valve, we will go ahead and check an echocardiogram since it has been a couple of years. ?

## 2022-04-20 NOTE — Patient Instructions (Signed)
Description   ?Have some leafy veggies today and remain consistent then continue taking warfarin 1/2 tablet daily except for 1 tablet on Sunday and Thursday. Recheck INR in 5 weeks. Stay consistent with greens. Coumadin Clinic [phone].  ?  ?  ?

## 2022-04-20 NOTE — Assessment & Plan Note (Signed)
On fenofibrate 145.  Lipid panel reviewed as above.  Well-controlled. ?

## 2022-04-20 NOTE — Assessment & Plan Note (Signed)
Orthostatics today: normal ?Dysequilibrium. Careful.  ?

## 2022-04-20 NOTE — Assessment & Plan Note (Signed)
On irbesartan 300 mg a day, Toprol 50.  Lasix 20 as needed. ?

## 2022-04-20 NOTE — Assessment & Plan Note (Signed)
On-X bileaflet mechanical valve (INR 2-2.5), Bentall procedure, mitral valve repair 2017 ?-Dr. Roxy Manns.  Had reimplantation of left main and right coronary arteries.  Overall stable doing well. ?

## 2022-04-20 NOTE — Progress Notes (Signed)
?Cardiology Office Note:   ? ?Date:  04/20/2022  ? ?ID:  Kenneth Ryan, DOB 1946-01-30, MRN [id number] ? ?PCP:  Tammi Sou, MD ?  ?[facility] Providers ?Cardiologist:  Candee Furbish, MD    ? ?Referring MD: Tammi Sou, MD  ? ? ?History of Present Illness:   ? ?Kenneth Ryan is a 76 y.o. male here for the follow-up of mechanical aortic valve, Bentall/aortic root replacement 11/03/2016 with mitral valve repair as well in the setting of bicuspid aortic valve.  He had severe aortic regurgitation at that time.  He also had severe mitral regurgitation as well.  Dr. Roxy Manns. ? ?Ejection fraction postoperatively was 35% then improved shortly thereafter to 45%.to 50-55% ? ?Eye get tired. He feels tired. Mild SOB with heavy exertion. Can't stand the heat. Minimal chest pain with heavy muscle movement.  ? ?Mild dizzy with balance. 2 pugs and 1 other dog, sometimes has to sidestep when getting out of bed.  Thankfully, orthostatics were normal here today in clinic. ? ?Past Medical History:  ?Diagnosis Date  ? Aortic insufficiency due to bicuspid aortic valve   ? Mechanical valve placed 11/03/16  ? Aortic root aneurysm ([place])   ? Repaired 11/03/16.  ? Basal cell carcinoma   ? nose  ? BRBPR (bright red blood per rectum) 2022  ? 05/2021 colonoscopy->int hemorrhoids likely source  ? Chronic hoarseness   ? secondary to hx of laryngeal surgery  ? Gallstones   ? GERD (gastroesophageal reflux disease)   ? 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 Headache   ? History of tobacco abuse   ? Hypertriglyceridemia   ? IFG (impaired fasting glucose) 2018  ? A1c 5.4-5.6. 5.8% Nov 2021  ? Increased prostate specific antigen (PSA) velocity 09/2016  ? saw Dr. Jeffie Pollock 11/18/16: plan is to repeat PSA 6 wks and if still same/up then plan TRUS+ bx.  PSA's declined on recheck x 2 and as of 03/10/17 was 2.5.  Recommended annual PSA's with me, f/u with urol prn.  PSA velocity inc 09/2018, repeat PSA 12/07/18 PSA trending up again->to urol->repeats  Jan and Apr 2020 trending down, up into ULN range 09/2019->f/u PSA 6 mo  ? 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 Surgery and radiation---He had post treatment surveillance x 12 yrs and was officially "released" from f/u in summer 2014.  ? Lesion of vocal cord   ? Mitral regurgitation   ? severe by TEE 08/2016:  MV repair done 11/03/16  ? Mitral valve prolapse   ? MV repair 10/2016  ? Nonischemic cardiomyopathy ([place]) 2018  ? dilated LV, systolic dysfunction: see echo results in Surgical hx section, 01/2017 and 05/2017.  ? Painless rectal bleeding 05/2021  ? EGD w/some gastritis, Colonoscopy with int/ext hemorrhoids, 1 polyp  ? 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 Size 27/29 mm On-X bileaflet mechanical valve and synthetic root conduit (coumadin and ASA '81mg'$  as per cardiology rec's) with reimplantation of left main and right coronary arteries.  Per CV surg, as of 02/16/17, his INR goal is 1.5-2.5.  ? s/p Bentall procedure for aortic root aneurysm ([place]) 11/03/2016  ? 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  ? Complex valvuloplasty including artificial Gore-tex neochord placement x6 and 28 mm Sorin Memo 3D ring annuloplasty  ? ? ?Past Surgical History:  ?Procedure Laterality Date  ? BENTALL PROCEDURE N/A 11/03/2016  ? Procedure: BENTALL PROCEDURE AORTIC ROOT REPLACEMENT + mechanical AV;  Surgeon: Valentina Gu  Roxy Manns, MD;  Location: [facility];  Service: Open Heart Surgery;  Laterality: N/A;  ? BIOPSY  06/05/2021  ? Procedure: BIOPSY;  Surgeon: Thornton Park, MD;  [facility];  Service: Gastroenterology;;  ? CARDIAC CATHETERIZATION N/A 09/29/2016  ? Clean coronaries, EF 60-65%.  Procedure: Right/Left Heart Cath and Coronary Angiography;  Surgeon: Jettie Booze, MD;  Location: [facility];  Service: Cardiovascular;  Laterality: N/A;  ? CHOLECYSTECTOMY  1980  ? COLONOSCOPY  2003 and 2013  ? Both normal.  ? COLONOSCOPY WITH PROPOFOL N/A 06/05/2021  ?  diverticulosis descending and sigmoid, int+ext nonbleeding hemorr, 1 sessile seratted polyp w/out cytologic dysplasia. Procedure: COLONOSCOPY WITH PROPOFOL;  Surgeon: Thornton Park, MD;  [facility];  Service: Gastroenterology;  Laterality: N/A;  ? DIRECT LARYNGOSCOPY Left 10/17/2015  ? Procedure: MICRO DIRECT LARYNGOSCOPY WITH FROZEN SECTION;  Surgeon: Jodi Marble, MD;  Location: [facility];  Service: ENT;  Laterality: Left;  ? ESOPHAGOGASTRODUODENOSCOPY (EGD) WITH PROPOFOL N/A 06/05/2021  ? Barrett's esoph.  Gastritis (h pylori neg), multiple benign gastric polyps. Procedure: ESOPHAGOGASTRODUODENOSCOPY (EGD) WITH PROPOFOL;  Surgeon: Thornton Park, MD;  [facility];  Service: Gastroenterology;  Laterality: N/A;  ? MITRAL VALVE REPAIR N/A 11/03/2016  ? Procedure: MITRAL VALVE REPAIR (MVR);  Surgeon: Rexene Alberts, MD;  Location: [facility];  Service: Open Heart Surgery;  Laterality: N/A;  ? PERIPHERAL VASCULAR CATHETERIZATION N/A 09/29/2016  ? Procedure: Abdominal Aortogram;  Surgeon: Jettie Booze, MD;  Location: [facility];  Service: Cardiovascular;  Laterality: N/A;  ? PERIPHERAL VASCULAR CATHETERIZATION N/A 09/29/2016  ? Procedure: Thoracic Aortogram;  Surgeon: Jettie Booze, MD;  Location: [facility];  Service: Cardiovascular;  Laterality: N/A;  ? POLYPECTOMY  06/05/2021  ? Procedure: POLYPECTOMY;  Surgeon: Thornton Park, MD;  [facility];  Service: Gastroenterology;;  ? TEE WITHOUT CARDIOVERSION N/A 09/17/2016  ? Procedure: TRANSESOPHAGEAL ECHOCARDIOGRAM (TEE);  Surgeon: Jerline Pain, MD;  Location: [facility];  Service: Cardiovascular;  Laterality: N/A;  ? TEE WITHOUT CARDIOVERSION N/A 11/03/2016  ? Procedure: TRANSESOPHAGEAL ECHOCARDIOGRAM (TEE);  Surgeon: Rexene Alberts, MD;  Location: [facility];  Service: Open Heart Surgery;  Laterality: N/A;  ? TONSILECTOMY/ADENOIDECTOMY WITH MYRINGOTOMY  1960  ? TRANSESOPHAGEAL ECHOCARDIOGRAM  09/17/2016   ? 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  Mod/severe AR w/functional bicuspid AV.  EF 45%.  ? TRANSTHORACIC ECHOCARDIOGRAM  07/2015  ? Bicuspid aortic valve + mod mitral regurg  ? TRANSTHORACIC ECHOCARDIOGRAM  02/01/2017  ? (Post aortic root/valve surgery): EF 35%, diffuse hypokinesis, LV dilatation, grade I DD. Cardiologist added ARB and increased his BB dose.  Lasix prn added 06/2017 due to some LE edema.  ? TRANSTHORACIC ECHOCARDIOGRAM  05/2017; 11/28/20  ? 05/2017 LVEF 45-50%.  Grd II DD.  Mechanical AV good, MV repair good. 11/2020 EF 16-10%, no diastolic dysfxn, mech AV good, MV repair good.  ? Vocal cord biopsy  09/2015  ? No atypia or inflammation.  Some fibrous changes that are possibly from hx of radiation therapy.  ? ? ?Current Medications: ?Current Meds  ?Medication Sig  ? acetaminophen (TYLENOL) 500 MG tablet Take 500-1,000 mg by mouth every 6 (six) hours as needed for mild pain (depends on pain level if takes 500 mg or 1000 mg).  ? aspirin EC 81 MG EC tablet Take 1 tablet (81 mg total) by mouth daily.  ? fenofibrate (TRICOR) 145 MG tablet TAKE 1 TABLET BY MOUTH DAILY  ? furosemide (LASIX) 20 MG tablet Take  1 tablet (20 mg total) by mouth daily as needed for fluid or edema (2 or more pounds).  ? irbesartan (AVAPRO) 300 MG tablet Take 1 tablet (300 mg total) by mouth daily.  ? metoprolol succinate (TOPROL-XL) 50 MG 24 hr tablet TAKE 1 TABLET BY MOUTH EVERY DAY  ? Multiple Vitamins-Minerals (MULTIVITAMIN PO) Take 1 tablet by mouth daily.   ? pantoprazole (PROTONIX) 40 MG tablet Take 1 tablet (40 mg total) by mouth daily.  ? warfarin (COUMADIN) 5 MG tablet Take 1/2-1 tablet daily as directed by the coumadin clinic.  ?  ? ?Allergies:   Patient has no known allergies.  ? ?Social History  ? ?Socioeconomic History  ? Marital status: Widowed  ?  Spouse name: Not on file  ? Number of children: 1  ? Years of education: Not on file  ? Highest education level: Not on file  ?Occupational History  ? Occupation: retired   ?Tobacco Use  ? Smoking status: Former  ?  Types: Cigarettes  ?  Quit date: 07/21/1989  ?  Years since quitting: 32.7  ? Smokeless tobacco: Never  ?Vaping Use  ? Vaping Use: Never used  ?Substance and Sexual Activit

## 2022-04-20 NOTE — Assessment & Plan Note (Signed)
Encourage continued movement, exercise.  Checking lab work today to make sure he does not have any surreptitious anemia.  Previous lab work unremarkable.  Also checking TSH. ?

## 2022-04-20 NOTE — Patient Instructions (Addendum)
Medication Instructions:  ?Your physician recommends that you continue on your current medications as directed. Please refer to the Current Medication list given to you today. ? ?*If you need a refill on your cardiac medications before your next appointment, please call your pharmacy* ? ? ?Lab Work: ?CBC. BMET and TSH today ? ?If you have labs (blood work) drawn today and your tests are completely normal, you will receive your results only by: ?MyChart Message (if you have MyChart) OR ?A paper copy in the mail ?If you have any lab test that is abnormal or we need to change your treatment, we will call you to review the results. ? ? ?Testing/Procedures: ?Your physician has requested that you have an echocardiogram. Echocardiography is a painless test that uses sound waves to create images of your heart. It provides your doctor with information about the size and shape of your heart and how well your heart?s chambers and valves are working. This procedure takes approximately one hour. There are no restrictions for this procedure. ? ? ? ?Follow-Up: ?At [facility], you and your health needs are our priority.  As part of our continuing mission to provide you with exceptional heart care, we have created designated Provider Care Teams.  These Care Teams include your primary Cardiologist (physician) and Advanced Practice Providers (APPs -  Physician Assistants and Nurse Practitioners) who all work together to provide you with the care you need, when you need it. ? ?We recommend signing up for the patient portal called "MyChart".  Sign up information is provided on this After Visit Summary.  MyChart is used to connect with patients for Virtual Visits (Telemedicine).  Patients are able to view lab/test results, encounter notes, upcoming appointments, etc.  Non-urgent messages can be sent to your provider as well.   ?To learn more about what you can do with MyChart, go to [email or web address].   ? ?Your next  appointment:   ?12 month(s) ? ?The format for your next appointment:   ?In Person ? ?Provider:   ?Candee Furbish, MD { ? ? ? ?Important Information About [facility] ? ? ? ? ?  ?

## 2022-04-29 DIAGNOSIS — R972 Elevated prostate specific antigen [PSA]: Secondary | ICD-10-CM | POA: Diagnosis not present

## 2022-04-29 LAB — PSA: PSA: 4.87

## 2022-05-04 ENCOUNTER — Other Ambulatory Visit: Payer: Self-pay | Admitting: Cardiology

## 2022-05-06 ENCOUNTER — Other Ambulatory Visit: Payer: Self-pay | Admitting: Cardiology

## 2022-05-06 DIAGNOSIS — R972 Elevated prostate specific antigen [PSA]: Secondary | ICD-10-CM | POA: Diagnosis not present

## 2022-05-06 DIAGNOSIS — R351 Nocturia: Secondary | ICD-10-CM | POA: Diagnosis not present

## 2022-05-06 DIAGNOSIS — Q2543 Congenital aneurysm of aorta: Secondary | ICD-10-CM

## 2022-05-06 DIAGNOSIS — Z9889 Other specified postprocedural states: Secondary | ICD-10-CM

## 2022-05-06 DIAGNOSIS — N401 Enlarged prostate with lower urinary tract symptoms: Secondary | ICD-10-CM | POA: Diagnosis not present

## 2022-05-06 DIAGNOSIS — I10 Essential (primary) hypertension: Secondary | ICD-10-CM

## 2022-05-06 DIAGNOSIS — I7121 Aneurysm of the ascending aorta, without rupture: Secondary | ICD-10-CM

## 2022-05-06 DIAGNOSIS — I420 Dilated cardiomyopathy: Secondary | ICD-10-CM

## 2022-05-06 DIAGNOSIS — Z954 Presence of other heart-valve replacement: Secondary | ICD-10-CM

## 2022-05-07 ENCOUNTER — Ambulatory Visit (HOSPITAL_COMMUNITY): Payer: PPO | Attending: Cardiovascular Disease

## 2022-05-07 DIAGNOSIS — I420 Dilated cardiomyopathy: Secondary | ICD-10-CM | POA: Insufficient documentation

## 2022-05-07 LAB — ECHOCARDIOGRAM COMPLETE
AR max vel: 1.64 cm2
AV Area VTI: 1.79 cm2
AV Area mean vel: 1.74 cm2
AV Mean grad: 7 mmHg
AV Peak grad: 13.1 mmHg
Ao pk vel: 1.81 m/s
Area-P 1/2: 1.96 cm2
MV VTI: 1.35 cm2
S' Lateral: 3.7 cm

## 2022-05-12 ENCOUNTER — Other Ambulatory Visit: Payer: Self-pay | Admitting: Cardiology

## 2022-05-12 NOTE — Telephone Encounter (Signed)
Pt's pharmacy is requesting a refill on pantoprazole. Would Dr. [name] like to refill this medication? Please address

## 2022-05-19 DIAGNOSIS — R972 Elevated prostate specific antigen [PSA]: Secondary | ICD-10-CM | POA: Diagnosis not present

## 2022-05-25 ENCOUNTER — Ambulatory Visit (INDEPENDENT_AMBULATORY_CARE_PROVIDER_SITE_OTHER): Payer: PPO

## 2022-05-25 DIAGNOSIS — Z9889 Other specified postprocedural states: Secondary | ICD-10-CM

## 2022-05-25 DIAGNOSIS — Z5181 Encounter for therapeutic drug level monitoring: Secondary | ICD-10-CM | POA: Diagnosis not present

## 2022-05-25 LAB — POCT INR: INR: 2.5 (ref 2.0–3.0)

## 2022-05-25 NOTE — Patient Instructions (Signed)
Description   Continue taking warfarin 1/2 tablet daily except for 1 tablet on Sunday and Thursday. Recheck INR in 6 weeks.  Stay consistent with greens.  Coumadin Clinic [phone].

## 2022-05-28 ENCOUNTER — Other Ambulatory Visit: Payer: Self-pay | Admitting: Urology

## 2022-05-28 DIAGNOSIS — R972 Elevated prostate specific antigen [PSA]: Secondary | ICD-10-CM

## 2022-06-13 ENCOUNTER — Ambulatory Visit
Admission: RE | Admit: 2022-06-13 | Discharge: 2022-06-13 | Disposition: A | Discharge status: Home / Self Care | Payer: PPO | Source: Ambulatory Visit | Attending: Urology | Admitting: Urology

## 2022-06-13 DIAGNOSIS — R972 Elevated prostate specific antigen [PSA]: Secondary | ICD-10-CM

## 2022-06-13 MED ORDER — GADOBENATE DIMEGLUMINE 529 MG/ML IV SOLN
20.0000 mL | Freq: Once | INTRAVENOUS | Status: AC | PRN
  Administered 2022-06-13: 20 mL via INTRAVENOUS

## 2022-06-18 ENCOUNTER — Other Ambulatory Visit: Payer: Self-pay | Admitting: Cardiology

## 2022-06-18 DIAGNOSIS — I10 Essential (primary) hypertension: Secondary | ICD-10-CM

## 2022-06-18 DIAGNOSIS — Q2543 Congenital aneurysm of aorta: Secondary | ICD-10-CM

## 2022-06-18 DIAGNOSIS — Z9889 Other specified postprocedural states: Secondary | ICD-10-CM

## 2022-06-18 DIAGNOSIS — I420 Dilated cardiomyopathy: Secondary | ICD-10-CM

## 2022-06-18 DIAGNOSIS — Z954 Presence of other heart-valve replacement: Secondary | ICD-10-CM

## 2022-06-18 DIAGNOSIS — I7121 Aneurysm of the ascending aorta, without rupture: Secondary | ICD-10-CM

## 2022-06-22 DIAGNOSIS — R972 Elevated prostate specific antigen [PSA]: Secondary | ICD-10-CM | POA: Diagnosis not present

## 2022-06-25 ENCOUNTER — Telehealth: Payer: Self-pay | Admitting: Cardiology

## 2022-06-25 ENCOUNTER — Telehealth: Payer: Self-pay | Admitting: *Deleted

## 2022-06-25 NOTE — Telephone Encounter (Signed)
I have s/w the pt and he is agreeable to plan of care for tele visit 07/01/22 @ 2 pm. Med rec and consent are done.

## 2022-06-25 NOTE — Telephone Encounter (Signed)
I have s/w the pt and he is agreeable to plan of care for tele visit 07/01/22 @ 2 pm. Med rec and consent are done.     Patient Consent for Virtual Visit        Kenneth [facility] has provided verbal consent on 06/25/2022 for a virtual visit (video or telephone).   CONSENT FOR VIRTUAL VISIT FOR:  Kenneth [facility]  By participating in this virtual visit I agree to the following:  I hereby voluntarily request, consent and authorize [facility] and its employed or contracted physicians, physician assistants, nurse practitioners or other licensed health care professionals (the Practitioner), to provide me with telemedicine health care services (the "Services") as deemed necessary by the treating Practitioner. I acknowledge and consent to receive the Services by the Practitioner via telemedicine. I understand that the telemedicine visit will involve communicating with the Practitioner through live audiovisual communication technology and the disclosure of certain medical information by electronic transmission. I acknowledge that I have been given the opportunity to request an in-person assessment or other available alternative prior to the telemedicine visit and am voluntarily participating in the telemedicine visit.  I understand that I have the right to withhold or withdraw my consent to the use of telemedicine in the course of my care at any time, without affecting my right to future care or treatment, and that the Practitioner or I may terminate the telemedicine visit at any time. I understand that I have the right to inspect all information obtained and/or recorded in the course of the telemedicine visit and may receive copies of available information for a reasonable fee.  I understand that some of the potential risks of receiving the Services via telemedicine include:  Delay or interruption in medical evaluation due to technological equipment failure or disruption; Information transmitted may not  be sufficient (e.g. poor resolution of images) to allow for appropriate medical decision making by the Practitioner; and/or  In rare instances, security protocols could fail, causing a breach of personal health information.  Furthermore, I acknowledge that it is my responsibility to provide information about my medical history, conditions and care that is complete and accurate to the best of my ability. I acknowledge that Practitioner's advice, recommendations, and/or decision may be based on factors not within their control, such as incomplete or inaccurate data provided by me or distortions of diagnostic images or specimens that may result from electronic transmissions. I understand that the practice of medicine is not an exact science and that Practitioner makes no warranties or guarantees regarding treatment outcomes. I acknowledge that a copy of this consent can be made available to me via my patient portal ([facility]), or I can request a printed copy by calling the office of [facility].    I understand that my insurance will be billed for this visit.   I have read or had this consent read to me. I understand the contents of this consent, which adequately explains the benefits and risks of the Services being provided via telemedicine.  I have been provided ample opportunity to ask questions regarding this consent and the Services and have had my questions answered to my satisfaction. I give my informed consent for the services to be provided through the use of telemedicine in my medical care

## 2022-06-25 NOTE — Telephone Encounter (Signed)
   Name: Kenneth Ryan  DOB: 11/21/1946  MRN: [id number]  Primary Cardiologist: Candee Furbish, MD   Preoperative team, please contact this patient and set up a phone call appointment for further preoperative risk assessment. Please obtain consent and complete medication review. Thank you for your help.  I confirm that guidance regarding antiplatelet and oral anticoagulation therapy has been completed and, if necessary, noted below.  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     Procedure:  MRI Vision Biopsy Date of procedure: TBD   Per office protocol, patient can hold warfarin for 5 days prior to procedure.     Patient will NOT need bridging with Lovenox (enoxaparin) around procedure.  Additionally, per office protocol, patient may hold aspirin for 5 days prior to procedure.  Please resume aspirin and warfarin as soon as possible postprocedure, at the discretion of the surgeon.  Lenna Sciara, NP 06/25/2022, 1:47 PM [facility] [address] [place],  [id number]

## 2022-06-25 NOTE — Telephone Encounter (Signed)
   Pre-operative Risk Assessment    Patient Name: DINNIS ROG  DOB: 03/30/1946 MRN: [id number]      Request for Surgical Clearance    Procedure:  MRI Vision Biopsy  Date of Surgery:  Clearance TBD                                 Surgeon:    Dr. Sherrell Puller Group or Practice Name:    [facility] Phone number:    [phone] Fax number:  [phone]   Type of Clearance Requested:   - Medical    Type of Anesthesia:  None    Additional requests/questions:    Caller noted they will need to stop  warfarin (COUMADIN) 5 MG tablet and aspirin EC 81 MG EC tablet  5 days prior to procedure   Signed, Heloise Beecham   06/25/2022, 9:41 AM

## 2022-06-25 NOTE — Telephone Encounter (Signed)
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    Procedure:  MRI Vision Biopsy Date of procedure: TBD  Per office protocol, patient can hold warfarin for 5 days prior to procedure.    Patient will NOT need bridging with Lovenox (enoxaparin) around procedure.  **This guidance is not considered finalized until pre-operative APP has relayed final recommendations.**

## 2022-07-01 ENCOUNTER — Encounter: Payer: Self-pay | Admitting: Family Medicine

## 2022-07-01 ENCOUNTER — Ambulatory Visit (INDEPENDENT_AMBULATORY_CARE_PROVIDER_SITE_OTHER): Payer: PPO | Admitting: Nurse Practitioner

## 2022-07-01 DIAGNOSIS — Z01810 Encounter for preprocedural cardiovascular examination: Secondary | ICD-10-CM | POA: Diagnosis not present

## 2022-07-01 NOTE — Progress Notes (Signed)
Virtual Visit via Telephone Note   Because of Kenneth Ryan's co-morbid illnesses, he is at least at moderate risk for complications without adequate follow up.  This format is felt to be most appropriate for this patient at this time.  The patient did not have access to video technology/had technical difficulties with video requiring transitioning to audio format only (telephone).  All issues noted in this document were discussed and addressed.  No physical exam could be performed with this format.  Please refer to the patient's chart for his consent to telehealth for [facility].  Evaluation Performed:  Preoperative cardiovascular risk assessment _____________   Date:  07/01/2022   Patient ID:  Kenneth Ryan, DOB [birth year], MRN [id number] Patient Location:  Home Provider location:   Office  Primary Care Provider:  Tammi Sou, MD Primary Cardiologist:  Candee Furbish, MD  Chief Complaint / Patient Profile   76 y.o. 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 5.8% Nov 2021   Increased prostate specific antigen (PSA) velocity 09/2016   saw Dr. Jeffie Pollock 11/18/16: plan is to repeat PSA 6 wks and if still same/up then plan TRUS+ bx.  PSA's declined on recheck x 2 and as of 03/10/17 was 2.5.  Recommended annual PSA's with me, f/u with urol prn.  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   Lesion of vocal cord    Mitral regurgitation    severe by TEE 08/2016:  MV repair done 11/03/16   Mitral valve prolapse    MV repair 10/2016   Nonischemic cardiomyopathy ([place]) 2018   dilated LV, systolic dysfunction: see echo results in Surgical hx section, 01/2017 and 05/2017.   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  Per CV surg, as of 02/16/17, his INR goal is 1.5-2.5.   s/p Bentall procedure for aortic root aneurysm ([place]) 11/03/2016   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  Complex valvuloplasty including artificial Gore-tex neochord placement x6 and 28 mm Sorin Memo 3D ring annuloplasty   Past Surgical History:  Procedure Laterality Date   BENTALL PROCEDURE N/A 11/03/2016   Procedure: BENTALL PROCEDURE AORTIC ROOT REPLACEMENT + mechanical AV;  Surgeon: Rexene Alberts, MD;  Location: [place];  Service: Open Heart Surgery;  Laterality: N/A;   BIOPSY  06/05/2021   Procedure: BIOPSY;  Surgeon: Thornton Park, MD;   Location: [facility];  Service: Gastroenterology;;   CARDIAC CATHETERIZATION N/A 09/29/2016   Clean coronaries, EF 60-65%.  Procedure: Right/Left Heart Cath and Coronary Angiography;  Surgeon: Jettie Booze, MD;  Location: [place] [facility];  Service: Cardiovascular;  Laterality: N/A;   CHOLECYSTECTOMY  1980   COLONOSCOPY  2003 and 2013   Both normal.   COLONOSCOPY WITH PROPOFOL N/A 06/05/2021   diverticulosis descending and sigmoid, int+ext nonbleeding hemorr, 1 sessile seratted polyp w/out cytologic dysplasia. Procedure: COLONOSCOPY WITH PROPOFOL;  Surgeon: Thornton Park, MD;  Location: [facility];  Service: Gastroenterology;  Laterality: N/A;   DIRECT LARYNGOSCOPY Left 10/17/2015   Procedure: MICRO DIRECT LARYNGOSCOPY WITH FROZEN SECTION;  Surgeon: Jodi Marble, MD;  Location: [place];  Service: ENT;  Laterality: Left;   ESOPHAGOGASTRODUODENOSCOPY (EGD) WITH PROPOFOL N/A 06/05/2021   Barrett's esoph.  Gastritis (h pylori neg), multiple benign gastric polyps. Procedure: ESOPHAGOGASTRODUODENOSCOPY (EGD) WITH PROPOFOL;  Surgeon: Thornton Park, MD;  Location: [facility];  Service: Gastroenterology;  Laterality: N/A;   MITRAL VALVE REPAIR N/A 11/03/2016   Procedure: MITRAL VALVE REPAIR (MVR);  Surgeon: Rexene Alberts, MD;  Location: [place];  Service: Open Heart Surgery;  Laterality: N/A;   PERIPHERAL VASCULAR CATHETERIZATION N/A 09/29/2016   Procedure: Abdominal Aortogram;  Surgeon: Jettie Booze, MD;  Location: [place] [facility];  Service: Cardiovascular;  Laterality: N/A;   PERIPHERAL VASCULAR CATHETERIZATION N/A 09/29/2016   Procedure: Thoracic Aortogram;  Surgeon: Jettie Booze, MD;  Location: [place] [facility];  Service: Cardiovascular;  Laterality: N/A;   POLYPECTOMY  06/05/2021   Procedure: POLYPECTOMY;  Surgeon: Thornton Park, MD;  Location: [facility];  Service: Gastroenterology;;   TEE WITHOUT CARDIOVERSION N/A 09/17/2016   Procedure:  TRANSESOPHAGEAL ECHOCARDIOGRAM (TEE);  Surgeon: Jerline Pain, MD;  Location: [place];  Service: Cardiovascular;  Laterality: N/A;   TEE WITHOUT CARDIOVERSION N/A 11/03/2016   Procedure: TRANSESOPHAGEAL ECHOCARDIOGRAM (TEE);  Surgeon: Rexene Alberts, MD;  Location: [place];  Service: Open Heart Surgery;  Laterality: N/A;   TONSILECTOMY/ADENOIDECTOMY WITH MYRINGOTOMY  1960   TRANSESOPHAGEAL ECHOCARDIOGRAM  09/17/2016   Severe MR with ruptured chordae.  Mod/severe AR w/functional bicuspid AV.  EF 45%.   TRANSTHORACIC ECHOCARDIOGRAM  07/2015   Bicuspid aortic valve + mod mitral regurg   TRANSTHORACIC ECHOCARDIOGRAM  02/01/2017   (Post aortic root/valve surgery): EF 35%, diffuse hypokinesis, LV dilatation, grade I DD. Cardiologist added ARB and increased his BB dose.  Lasix prn added 06/2017 due to some LE edema.   TRANSTHORACIC ECHOCARDIOGRAM  05/2017; 11/28/20   05/2017 LVEF 45-50%.  Grd II DD.  Mechanical AV good, MV repair good. 11/2020 EF 24-58%, no diastolic dysfxn, mech AV good, MV repair good.   Vocal cord biopsy  09/2015   No atypia or inflammation.  Some fibrous changes that are possibly from hx of radiation therapy.    Allergies  No Known Allergies  History of Present Illness    Kenneth Ryan is a 76 y.o. male who presents via audio/video conferencing for a telehealth visit today.  Pt was last seen in cardiology clinic on 04/20/22 by  Dr. [name].  At that time Kenneth Ryan was doing well.  The patient is now pending procedure as outlined above. Since his last visit, he  denies chest pain, shortness of breath, lower extremity edema, fatigue, palpitations, melena, hematuria, hemoptysis, diaphoresis, weakness, presyncope, syncope, orthopnea, and PND.  Home Medications    Prior to Admission medications   Medication Sig Start Date End Date Taking? Authorizing Provider  acetaminophen (TYLENOL) 500 MG tablet Take 500-1,000 mg by mouth every 6 (six) hours as needed for mild pain  (depends on pain level if takes 500 mg or 1000 mg).    [provider]  aspirin EC 81 MG EC tablet Take 1 tablet (81 mg total) by mouth daily. 11/10/16   Nani Skillern, PA-C  fenofibrate (TRICOR) 145 MG tablet TAKE 1 TABLET BY MOUTH DAILY 01/02/22   [name], [name], MD  furosemide (LASIX) 20 MG tablet Take 1 tablet (20 mg total) by mouth daily as needed for fluid or edema (2 or more pounds). 09/20/18   [name], [name], MD  irbesartan (AVAPRO) 300 MG tablet TAKE 1 TABLET BY MOUTH EVERY DAY 05/06/22   Jerline Pain, MD  metoprolol succinate (TOPROL-XL) 50 MG 24 hr tablet TAKE 1 TABLET BY MOUTH EVERY DAY 06/18/22   Jerline Pain, MD  Multiple Vitamins-Minerals (MULTIVITAMIN PO) Take 1 tablet by mouth daily.     [provider]  pantoprazole (PROTONIX) 40 MG tablet TAKE 1 TABLET BY MOUTH EVERY DAY 05/12/22   Jerline Pain, MD  warfarin (COUMADIN) 5 MG tablet Take 1/2-1 tablet daily as directed by the coumadin clinic. 12/19/21   Jerline Pain, MD    Physical Exam    Vital Signs:  Dimas Alexandria does not have vital signs available for review today.  Given telephonic nature of communication, physical exam is limited. AAOx3. NAD. Normal affect.  Speech and respirations are unlabored.  Accessory Clinical Findings    None  Assessment & Plan    1.  Preoperative Cardiovascular Risk Assessment: The patient is doing well from a cardiac perspective. Therefore, based on ACC/AHA guidelines, the patient would be at acceptable risk for the planned procedure without further cardiovascular testing. The patient was advised that if he develops new symptoms prior to surgery to contact our office to arrange for a follow-up visit, and he verbalized understanding. According to the Revised Cardiac Risk Index (RCRI), his Perioperative Risk of Major Cardiac Event is (%): 0.9. His Functional Capacity in METs is: 7.59 according to the Duke Activity Status Index (DASI). Per office protocol,  patient can hold warfarin for 5 days prior to procedure.    Patient will NOT need bridging with Lovenox (enoxaparin) around procedure. Additionally, per office protocol, patient may hold aspirin for 5 days prior to procedure.  Please resume aspirin and warfarin as soon as possible postprocedure, at the discretion of the surgeon.  A copy of this note will be routed to requesting surgeon.  Time:   Today, I have spent 10 minutes with the patient with telehealth technology discussing medical history, symptoms, and management plan.    Emmaline Life, NP-C    07/01/2022, 2:01 PM [place] [address], [address] Office [phone] Fax [phone]

## 2022-07-06 ENCOUNTER — Ambulatory Visit (INDEPENDENT_AMBULATORY_CARE_PROVIDER_SITE_OTHER): Payer: PPO

## 2022-07-06 DIAGNOSIS — Z9889 Other specified postprocedural states: Secondary | ICD-10-CM | POA: Diagnosis not present

## 2022-07-06 DIAGNOSIS — Z5181 Encounter for therapeutic drug level monitoring: Secondary | ICD-10-CM

## 2022-07-06 LAB — POCT INR: INR: 3.2 — AB (ref 2.0–3.0)

## 2022-07-06 NOTE — Patient Instructions (Signed)
Description   Hold tomorrow's dose and then continue taking warfarin 1/2 tablet daily except for 1 tablet on Sunday and Thursday until 7/21 and follow pre-procedure instructions.  Recheck INR 1 week post procedure. Stay consistent with greens.  Coumadin Clinic [phone]

## 2022-07-15 ENCOUNTER — Emergency Department (HOSPITAL_BASED_OUTPATIENT_CLINIC_OR_DEPARTMENT_OTHER): Admit: 2022-07-15 | Discharge: 2022-07-15 | Disposition: A | Discharge status: Home / Self Care | Payer: PPO

## 2022-07-15 ENCOUNTER — Encounter (HOSPITAL_COMMUNITY): Payer: Self-pay

## 2022-07-15 ENCOUNTER — Other Ambulatory Visit: Payer: Self-pay

## 2022-07-15 ENCOUNTER — Emergency Department (HOSPITAL_COMMUNITY)
Admission: EM | Admit: 2022-07-15 | Discharge: 2022-07-15 | Disposition: A | Discharge status: Home / Self Care | Payer: PPO | Attending: Emergency Medicine | Admitting: Emergency Medicine

## 2022-07-15 DIAGNOSIS — Z7901 Long term (current) use of anticoagulants: Secondary | ICD-10-CM | POA: Insufficient documentation

## 2022-07-15 DIAGNOSIS — R600 Localized edema: Secondary | ICD-10-CM | POA: Diagnosis not present

## 2022-07-15 DIAGNOSIS — M79605 Pain in left leg: Secondary | ICD-10-CM | POA: Insufficient documentation

## 2022-07-15 DIAGNOSIS — F17210 Nicotine dependence, cigarettes, uncomplicated: Secondary | ICD-10-CM | POA: Diagnosis not present

## 2022-07-15 DIAGNOSIS — R972 Elevated prostate specific antigen [PSA]: Secondary | ICD-10-CM | POA: Diagnosis not present

## 2022-07-15 DIAGNOSIS — Z7982 Long term (current) use of aspirin: Secondary | ICD-10-CM | POA: Insufficient documentation

## 2022-07-15 DIAGNOSIS — Z8521 Personal history of malignant neoplasm of larynx: Secondary | ICD-10-CM | POA: Diagnosis not present

## 2022-07-15 LAB — CBC WITH DIFFERENTIAL/PLATELET
Abs Immature Granulocytes: 0.01 10*3/uL (ref 0.00–0.07)
Basophils Absolute: 0 10*3/uL (ref 0.0–0.1)
Basophils Relative: 0 %
Eosinophils Absolute: 0.1 10*3/uL (ref 0.0–0.5)
Eosinophils Relative: 2 %
HCT: 43.1 % (ref 39.0–52.0)
Hemoglobin: 14.1 g/dL (ref 13.0–17.0)
Immature Granulocytes: 0 %
Lymphocytes Relative: 32 %
Lymphs Abs: 1.6 10*3/uL (ref 0.7–4.0)
MCH: 29.1 pg (ref 26.0–34.0)
MCHC: 32.7 g/dL (ref 30.0–36.0)
MCV: 88.9 fL (ref 80.0–100.0)
Monocytes Absolute: 0.4 10*3/uL (ref 0.1–1.0)
Monocytes Relative: 8 %
Neutro Abs: 2.9 10*3/uL (ref 1.7–7.7)
Neutrophils Relative %: 58 %
Platelets: 182 10*3/uL (ref 150–400)
RBC: 4.85 MIL/uL (ref 4.22–5.81)
RDW: 14.2 % (ref 11.5–15.5)
WBC: 5.1 10*3/uL (ref 4.0–10.5)
nRBC: 0 % (ref 0.0–0.2)

## 2022-07-15 LAB — BASIC METABOLIC PANEL
Anion gap: 6 (ref 5–15)
BUN: 14 mg/dL (ref 8–23)
CO2: 25 mmol/L (ref 22–32)
Calcium: 9.5 mg/dL (ref 8.9–10.3)
Chloride: 110 mmol/L (ref 98–111)
Creatinine, Ser: 1.06 mg/dL (ref 0.61–1.24)
GFR, Estimated: >60 mL/min (ref >60)
Glucose, Bld: 109 mg/dL — ABNORMAL HIGH (ref 70–99)
Potassium: 4.2 mmol/L (ref 3.5–5.1)
Sodium: 141 mmol/L (ref 135–145)

## 2022-07-15 NOTE — Progress Notes (Signed)
Left lower extremity venous duplex has been completed. Preliminary results can be found in CV Proc through chart review.  Results were given to Paulita Cradle PA.  07/15/22 5:58 PM Kenneth Ryan RVT

## 2022-07-15 NOTE — ED Provider Notes (Signed)
[facility] DEPT Provider Note   CSN: [id number] Arrival date & time: 07/15/22  1556     History PMH: HLD, GERD, gallstones, mitral regurgitation, mitral valve prolapse, mitral valve repair, tobacco use, history of laryngeal cancer prosthetic valve on Coumadin, nonischemic cardiomyopathy Chief Complaint  Patient presents with   Leg Pain    Kenneth Ryan is a 76 y.o. male. Pt complains of left leg cramping.  Patient states he had pretty significant left leg cramping all the way from his thigh down to his calf.  He says this was mostly yesterday and was intermittent.  He denies any associated swelling or erythema to the limb.  No numbness or tingling.  No lower back pain.  He says the pain is gone now, but his primary care wanted him to come to the emergency department to check for blood clot.  He is supposed to be on Coumadin, however had to stop this due to a recent biopsy.  He does not have any history of blood clots.  He denies any chest pain or shortness of breath     Leg Pain      Home Medications Prior to Admission medications   Medication Sig Start Date End Date Taking? Authorizing Provider  acetaminophen (TYLENOL) 500 MG tablet Take 500-1,000 mg by mouth every 6 (six) hours as needed for mild pain (depends on pain level if takes 500 mg or 1000 mg).    [provider]  aspirin EC 81 MG EC tablet Take 1 tablet (81 mg total) by mouth daily. 11/10/16   Nani Skillern, PA-C  fenofibrate (TRICOR) 145 MG tablet TAKE 1 TABLET BY MOUTH DAILY 01/02/22   [name], [name], MD  furosemide (LASIX) 20 MG tablet Take 1 tablet (20 mg total) by mouth daily as needed for fluid or edema (2 or more pounds). 09/20/18   [name], [name], MD  irbesartan (AVAPRO) 300 MG tablet TAKE 1 TABLET BY MOUTH EVERY DAY 05/06/22   Jerline Pain, MD  metoprolol succinate (TOPROL-XL) 50 MG 24 hr tablet TAKE 1 TABLET BY MOUTH EVERY DAY 06/18/22   Jerline Pain, MD   Multiple Vitamins-Minerals (MULTIVITAMIN PO) Take 1 tablet by mouth daily.     [provider]  pantoprazole (PROTONIX) 40 MG tablet TAKE 1 TABLET BY MOUTH EVERY DAY 05/12/22   Jerline Pain, MD  warfarin (COUMADIN) 5 MG tablet Take 1/2-1 tablet daily as directed by the coumadin clinic. 12/19/21   Jerline Pain, MD      Allergies    Patient has no known allergies.    Review of Systems   Review of Systems  Respiratory:  Negative for shortness of breath.   Cardiovascular:  Negative for chest pain and leg swelling.  Musculoskeletal:  Positive for myalgias.  All other systems reviewed and are negative.   Physical Exam Updated Vital Signs BP 110/65 (BP Location: Left Arm)   Pulse (!) 56   Temp (!) 97.4 F (36.3 C) (Oral)   Resp 16   Ht 6' (1.829 m)   Wt 97.5 kg   SpO2 97%   BMI 29.16 kg/m  Physical Exam Vitals and nursing note reviewed.  Constitutional:      General: He is not in acute distress.    Appearance: Normal appearance. He is not ill-appearing, toxic-appearing or diaphoretic.  HENT:     Head: Normocephalic and atraumatic.     Nose: No nasal deformity.     Mouth/Throat:  Lips: Pink. No lesions.     Mouth: Mucous membranes are moist. No injury, lacerations, oral lesions or angioedema.     Pharynx: Oropharynx is clear. Uvula midline. No pharyngeal swelling, oropharyngeal exudate, posterior oropharyngeal erythema or uvula swelling.  Eyes:     General: Gaze aligned appropriately. No scleral icterus.       Right eye: No discharge.        Left eye: No discharge.     Conjunctiva/sclera: Conjunctivae normal.     Right eye: Right conjunctiva is not injected. No exudate or hemorrhage.    Left eye: Left conjunctiva is not injected. No exudate or hemorrhage. Cardiovascular:     Rate and Rhythm: Normal rate and regular rhythm.     Pulses: Normal pulses.          Radial pulses are 2+ on the right side and 2+ on the left side.       Dorsalis pedis pulses are 2+  on the right side and 2+ on the left side.     Heart sounds: Normal heart sounds, S1 normal and S2 normal. Heart sounds not distant. No murmur heard.    No friction rub. No gallop. No S3 or S4 sounds.  Pulmonary:     Effort: Pulmonary effort is normal. No accessory muscle usage or respiratory distress.     Breath sounds: Normal breath sounds. No stridor. No wheezing, rhonchi or rales.  Chest:     Chest wall: No tenderness.  Abdominal:     General: Abdomen is flat. There is no distension.     Palpations: Abdomen is soft. There is no mass or pulsatile mass.     Tenderness: There is no abdominal tenderness. There is no guarding or rebound.  Musculoskeletal:     Right lower leg: No edema.     Left lower leg: No edema.  Skin:    General: Skin is warm and dry.     Coloration: Skin is not jaundiced or pale.     Findings: No bruising, erythema, lesion or rash.  Neurological:     General: No focal deficit present.     Mental Status: He is alert and oriented to person, place, and time.     GCS: GCS eye subscore is 4. GCS verbal subscore is 5. GCS motor subscore is 6.  Psychiatric:        Mood and Affect: Mood normal.        Behavior: Behavior normal. Behavior is cooperative.     ED Results / Procedures / Treatments   Labs (all labs ordered are listed, but only abnormal results are displayed) Labs Reviewed  BASIC METABOLIC PANEL - Abnormal; Notable for the following components:      Result Value   Glucose, Bld 109 (*)    All other components within normal limits  CBC WITH DIFFERENTIAL/PLATELET    EKG None  Radiology VAS [place] LOWER EXTREMITY VENOUS (DVT) (7a-7p)  Result Date: 07/15/2022  Lower Venous DVT Study Patient Name:  Kenneth Ryan [facility]  Date of Exam:   07/15/2022 Medical Rec #: [id number]          Accession #:    [id number] Date of Birth: 1946-08-28          Patient Gender: M Patient Age:   24 years Exam Location:  [facility] Procedure:      VAS [place] LOWER EXTREMITY VENOUS  (DVT) Referring Phys: Gaius Ishaq --------------------------------------------------------------------------------  Indications: Pain.  Risk Factors: None identified. Anticoagulation: Coumadin.  Comparison Study: No prior studies. Performing Technologist: Oliver Hum RVT  Examination Guidelines: A complete evaluation includes B-mode imaging, spectral Doppler, color Doppler, and power Doppler as needed of all accessible portions of each vessel. Bilateral testing is considered an integral part of a complete examination. Limited examinations for reoccurring indications may be performed as noted. The reflux portion of the exam is performed with the patient in reverse Trendelenburg.  +-----+---------------+---------+-----------+----------+--------------+ RIGHTCompressibilityPhasicitySpontaneityPropertiesThrombus Aging +-----+---------------+---------+-----------+----------+--------------+ CFV  Full           Yes      Yes                                 +-----+---------------+---------+-----------+----------+--------------+   +---------+---------------+---------+-----------+----------+--------------+ LEFT     CompressibilityPhasicitySpontaneityPropertiesThrombus Aging +---------+---------------+---------+-----------+----------+--------------+ CFV      Full           Yes      Yes                                 +---------+---------------+---------+-----------+----------+--------------+ SFJ      Full                                                        +---------+---------------+---------+-----------+----------+--------------+ FV Prox  Full                                                        +---------+---------------+---------+-----------+----------+--------------+ FV Mid   Full                                                        +---------+---------------+---------+-----------+----------+--------------+ FV DistalFull                                                         +---------+---------------+---------+-----------+----------+--------------+ PFV      Full                                                        +---------+---------------+---------+-----------+----------+--------------+ POP      Full           Yes      Yes                                 +---------+---------------+---------+-----------+----------+--------------+ PTV      Full                                                        +---------+---------------+---------+-----------+----------+--------------+  PERO     Full                                                        +---------+---------------+---------+-----------+----------+--------------+    Summary: RIGHT: - No evidence of common femoral vein obstruction.  LEFT: - There is no evidence of deep vein thrombosis in the lower extremity.  - No cystic structure found in the popliteal fossa.  *See table(s) above for measurements and observations. Electronically signed by Monica Martinez MD on 07/15/2022 at 6:25:57 PM.    Final     Procedures Procedures   Medications Ordered in ED Medications - No data to display  ED Course/ Medical Decision Making/ A&P Clinical Course as of 07/15/22 1852  Wed Jul 15, 2022  1742 DVT negative [GL]    Clinical Course User Index [GL] Sherre Poot Adora Fridge, PA-C                           Medical Decision Making Amount and/or Complexity of Data Reviewed Labs: ordered.   Patient presents with left extremity crampiness over the past several days in the setting of being off of his warfarin.  He was sent here to rule out DVT.  His DVT study was negative here.  He also had labs drawn here and he has normal electrolytes.  No other concerning findings.  Pain is completely gone at this point.  He has remained hemodynamically stable and he is stable for discharge.  I recommend following up with primary care to determine when he needs to go back on his warfarin.  Final Clinical  Impression(s) / ED Diagnoses Final diagnoses:  Left leg pain    Rx / DC Orders ED Discharge Orders     None         Adolphus Birchwood, PA-C 07/15/22 [place], [place], DO 07/15/22 1859

## 2022-07-15 NOTE — Discharge Instructions (Signed)
Please contact your doctor to determine when you should restart your warfarin.  Your DVT study and labs were normal today.

## 2022-07-15 NOTE — ED Provider Triage Note (Signed)
Emergency Medicine Provider Triage Evaluation Note  Kenneth Ryan , a 76 y.o. male  was evaluated in triage.  Pt complains of left leg cramping.  Patient states he had pretty significant left leg cramping all the way from his thigh down to his calf.  He says this was mostly yesterday and was intermittent.  He denies any associated swelling or erythema to the limb.  No numbness or tingling.  No lower back pain.  He says the pain is gone now, but his primary care wanted him to come to the emergency department to check for blood clot.  He is supposed to be on Coumadin, however had to stop this due to a recent biopsy.  He does not have any history of blood clots.  He denies any chest pain or shortness of breath  Review of Systems  Positive:  Negative:   Physical Exam  BP 133/68 (BP Location: Left Arm)   Pulse 63   Temp 97.8 F (36.6 C) (Oral)   Resp 16   Ht 6' (1.829 m)   Wt 97.5 kg   SpO2 99%   BMI 29.16 kg/m  Gen:   Awake, no distress   Resp:  Normal effort  MSK:   Moves extremities without difficulty  Other:  Pedal pulse 2+, No calf tenderness, swelling, or erythema. Sensation intact.  Medical Decision Making  Medically screening exam initiated at 5:00 PM.  Appropriate orders placed.  Levi Klaiber Remington was informed that the remainder of the evaluation will be completed by another provider, this initial triage assessment does not replace that evaluation, and the importance of remaining in the ED until their evaluation is complete.     Adolphus Birchwood, [place] 07/15/22 1702

## 2022-07-15 NOTE — ED Triage Notes (Signed)
Pt c/o cramps in his left leg since this morning. He states he has not taken his coumadin for the past 5 days because he was supposed to have a biopsy done on his prostate. Patient's provider sent him to the Ed to rule out a DVT. Pt denies cp or sob.

## 2022-07-15 NOTE — ED Notes (Signed)
An After Visit Summary was printed and given to the patient. Discharge instructions given and no further questions at this time.  

## 2022-07-21 DIAGNOSIS — C61 Malignant neoplasm of prostate: Secondary | ICD-10-CM

## 2022-07-21 HISTORY — DX: Malignant neoplasm of prostate: C61

## 2022-07-22 ENCOUNTER — Ambulatory Visit (INDEPENDENT_AMBULATORY_CARE_PROVIDER_SITE_OTHER): Payer: PPO | Admitting: *Deleted

## 2022-07-22 DIAGNOSIS — Z5181 Encounter for therapeutic drug level monitoring: Secondary | ICD-10-CM | POA: Diagnosis not present

## 2022-07-22 DIAGNOSIS — Z954 Presence of other heart-valve replacement: Secondary | ICD-10-CM | POA: Diagnosis not present

## 2022-07-22 DIAGNOSIS — Z9889 Other specified postprocedural states: Secondary | ICD-10-CM | POA: Diagnosis not present

## 2022-07-22 DIAGNOSIS — I7121 Aneurysm of the ascending aorta, without rupture: Secondary | ICD-10-CM | POA: Diagnosis not present

## 2022-07-22 LAB — POCT INR: INR: 1.6 — AB (ref 2.0–3.0)

## 2022-07-22 NOTE — Patient Instructions (Addendum)
Description   Today take 1 tablet then continue taking warfarin 1/2 tablet daily except for 1 tablet on Sunday and Thursday until 07/25/2022 and follow pre-procedure instructions.  Recheck INR 1 week post procedure.Stay consistent with greens. [facility]in [facility] [phone]     Last dose on 07/25/2022 then start holding for upcoming procedure. Resume normal dose after procedure.

## 2022-07-31 DIAGNOSIS — R972 Elevated prostate specific antigen [PSA]: Secondary | ICD-10-CM | POA: Diagnosis not present

## 2022-07-31 DIAGNOSIS — C61 Malignant neoplasm of prostate: Secondary | ICD-10-CM | POA: Diagnosis not present

## 2022-08-05 ENCOUNTER — Other Ambulatory Visit (HOSPITAL_COMMUNITY): Payer: Self-pay | Admitting: Urology

## 2022-08-05 DIAGNOSIS — C61 Malignant neoplasm of prostate: Secondary | ICD-10-CM

## 2022-08-07 ENCOUNTER — Ambulatory Visit (INDEPENDENT_AMBULATORY_CARE_PROVIDER_SITE_OTHER): Payer: PPO | Admitting: *Deleted

## 2022-08-07 DIAGNOSIS — Z9889 Other specified postprocedural states: Secondary | ICD-10-CM

## 2022-08-07 DIAGNOSIS — Z5181 Encounter for therapeutic drug level monitoring: Secondary | ICD-10-CM

## 2022-08-07 LAB — POCT INR: INR: 1.5 — AB (ref 2.0–3.0)

## 2022-08-12 ENCOUNTER — Encounter (HOSPITAL_COMMUNITY)
Admission: RE | Admit: 2022-08-12 | Discharge: 2022-08-12 | Disposition: A | Discharge status: Home / Self Care | Payer: PPO | Source: Ambulatory Visit | Attending: Urology | Admitting: Urology

## 2022-08-12 DIAGNOSIS — C61 Malignant neoplasm of prostate: Secondary | ICD-10-CM | POA: Insufficient documentation

## 2022-08-12 MED ORDER — PIFLIFOLASTAT F 18 (PYLARIFY) INJECTION
9.0000 | Freq: Once | INTRAVENOUS | Status: AC
  Administered 2022-08-12: 9 via INTRAVENOUS

## 2022-08-14 ENCOUNTER — Ambulatory Visit (INDEPENDENT_AMBULATORY_CARE_PROVIDER_SITE_OTHER): Payer: PPO

## 2022-08-14 DIAGNOSIS — Z9889 Other specified postprocedural states: Secondary | ICD-10-CM | POA: Diagnosis not present

## 2022-08-14 DIAGNOSIS — Z5181 Encounter for therapeutic drug level monitoring: Secondary | ICD-10-CM

## 2022-08-14 DIAGNOSIS — I7121 Aneurysm of the ascending aorta, without rupture: Secondary | ICD-10-CM

## 2022-08-14 DIAGNOSIS — Z954 Presence of other heart-valve replacement: Secondary | ICD-10-CM | POA: Diagnosis not present

## 2022-08-14 LAB — POCT INR: INR: 2.1 (ref 2.0–3.0)

## 2022-08-14 NOTE — Patient Instructions (Signed)
continue taking warfarin 1/2 tablet daily except for 1 tablet on Sunday and Thursday.  Recheck INR 4 weeks. Stay consistent with greens. Coumadin Clinic [phone]

## 2022-08-19 DIAGNOSIS — C61 Malignant neoplasm of prostate: Secondary | ICD-10-CM | POA: Diagnosis not present

## 2022-08-28 NOTE — Progress Notes (Signed)
GU Location of Tumor / Histology: Prostate Ca  If Prostate Cancer, Gleason Score is (3 + 4) and PSA is (4.87 as of 04/2022)  Biopsies      Past/Anticipated interventions by urology, if any:     Past/Anticipated interventions by medical oncology, if any: NA  Weight changes, if any: No  IPPS:  5 SHIM: 6  Bowel/Bladder complaints, if any:  No  Nausea/Vomiting, if any: No  Pain issues, if any:  0/10  SAFETY ISSUES: Prior radiation?  Yes, 2013 throat cancer. Pacemaker/ICD? No Possible current pregnancy? Male Is the patient on methotrexate? No  Current Complaints / other details:

## 2022-08-30 NOTE — Progress Notes (Signed)
Radiation Oncology         [phone] ________________________________  Initial Outpatient Consultation  Name: Kenneth Ryan MRN: [id number]  Date: 08/31/2022  DOB: 07-24-1946  CC:[name], [name], MD  [name], MD   REFERRING PHYSICIAN: [name], MD  DIAGNOSIS: 76 y.o. gentleman with Stage T1c adenocarcinoma of the prostate with Gleason score of 3+4, and PSA of 4.87.  No diagnosis found.  HISTORY OF PRESENT ILLNESS: Kenneth Ryan is a 76 y.o. male with a diagnosis of prostate cancer. He has been followed by Dr. Jeffie Pollock for a fluctuating PSA since 2017. More recently, his PSA became persistently elevated from 09/2021, reaching 4.87 in 04/2022. Dr. Jeffie Pollock requested ExoDx testing, which showed a score of 55.31 indicating an increased risk of high-grade prostate cancer. This prompted prostate MRI on 06/13/22 showing: two PI-RADS 4 lesions in peripheral zone-- 2.6 cm of bilateral posteromedial and left posterolateral apex, and 1 cm of right posterolateral mid. The patient proceeded to MRI fusion biopsy of the prostate on 07/31/22.  The prostate volume measured 46 cc.  Out of 18 core biopsies, 15 were positive.  The maximum Gleason score was 3+4, and this was seen in right mid lateral (with perineural invasion), one core from ROI #2, and one core from ROI #1 (with PNI). Additionally, Gleason 3+3 was seen in the other three cores from ROI #1, the second core from ROI #2, and eight of the standard cores.  He underwent staging PSMA PET scan on 08/12/22 showing: heterogeneous radiotracer activity within prostate gland; no evidence of metastatic adenopathy, visceral metastasis, or skeletal metastasis; mild activity in inguinal lymph nodes favored benign.  The patient reviewed the biopsy results with his urologist and he has kindly been referred today for discussion of potential radiation treatment options.   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 5.8% Nov 2021   Increased prostate specific antigen (PSA) velocity 09/2016   saw Dr. Jeffie Pollock 11/18/16: plan is to repeat PSA 6 wks and if still same/up then plan TRUS+ bx.  PSA's declined on recheck x 2 and as of 03/10/17 was 2.5.  Recommended annual PSA's with me, f/u with urol prn.  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   Lesion of vocal cord    Mitral regurgitation    severe by TEE 08/2016:  MV repair done 11/03/16   Mitral valve prolapse    MV repair 10/2016   Nonischemic cardiomyopathy ([place]) 2018   dilated LV, systolic dysfunction: see echo results  in Surgical hx section, 01/2017 and 05/2017.   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  Per CV surg, as of 02/16/17, his INR goal  is 1.5-2.5.   s/p Bentall procedure for aortic root aneurysm ([place]) 11/03/2016   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   Complex valvuloplasty including artificial Gore-tex neochord placement x6 and 28 mm Sorin Memo 3D ring annuloplasty      PAST SURGICAL HISTORY: Past Surgical History:  Procedure Laterality Date   BENTALL PROCEDURE N/A 11/03/2016   Procedure: BENTALL PROCEDURE AORTIC ROOT REPLACEMENT + mechanical AV;  Surgeon: Rexene Alberts, MD;  Location: [place];  Service: Open Heart Surgery;  Laterality: N/A;   BIOPSY  06/05/2021   Procedure: BIOPSY;  Surgeon: Thornton Park, MD;  Location[facility];  Service: Gastroenterology;;   CARDIAC CATHETERIZATION N/A 09/29/2016   Clean coronaries, EF 60-65%.  Procedure: Right/Left Heart Cath and Coronary Angiography;  Surgeon: Jettie Booze, MD;  Location: [place] [facility];  Service: Cardiovascular;  Laterality: N/A;   CHOLECYSTECTOMY  1980   COLONOSCOPY  2003 and 2013   Both normal.   COLONOSCOPY WITH PROPOFOL N/A 06/05/2021   diverticulosis descending and sigmoid, int+ext nonbleeding hemorr, 1 sessile seratted polyp w/out cytologic dysplasia. Procedure: COLONOSCOPY WITH PROPOFOL;  Surgeon: Thornton Park, MD;  [facility];  Service: Gastroenterology;  Laterality: N/A;   DIRECT LARYNGOSCOPY Left 10/17/2015   Procedure: MICRO DIRECT LARYNGOSCOPY WITH FROZEN SECTION;  Surgeon: Jodi Marble, MD;  Location: [place];  Service: ENT;  Laterality: Left;   ESOPHAGOGASTRODUODENOSCOPY (EGD) WITH PROPOFOL N/A 06/05/2021   Barrett's esoph.  Gastritis (h pylori neg), multiple benign gastric polyps. Procedure: ESOPHAGOGASTRODUODENOSCOPY (EGD) WITH PROPOFOL;  Surgeon: Thornton Park, MD;  [facility];  Service: Gastroenterology;  Laterality: N/A;   MITRAL VALVE REPAIR N/A 11/03/2016   Procedure: MITRAL VALVE REPAIR (MVR);  Surgeon: Rexene Alberts, MD;  Location: [place];  Service: Open Heart Surgery;  Laterality: N/A;    PERIPHERAL VASCULAR CATHETERIZATION N/A 09/29/2016   Procedure: Abdominal Aortogram;  Surgeon: Jettie Booze, MD;  Location: [place] [facility];  Service: Cardiovascular;  Laterality: N/A;   PERIPHERAL VASCULAR CATHETERIZATION N/A 09/29/2016   Procedure: Thoracic Aortogram;  Surgeon: Jettie Booze, MD;  Location: [place] [facility];  Service: Cardiovascular;  Laterality: N/A;   POLYPECTOMY  06/05/2021   Procedure: POLYPECTOMY;  Surgeon: Thornton Park, MD;  [facility];  Service: Gastroenterology;;   TEE WITHOUT CARDIOVERSION N/A 09/17/2016   Procedure: TRANSESOPHAGEAL ECHOCARDIOGRAM (TEE);  Surgeon: Jerline Pain, MD;  Location: [place];  Service: Cardiovascular;  Laterality: N/A;   TEE WITHOUT CARDIOVERSION N/A 11/03/2016   Procedure: TRANSESOPHAGEAL ECHOCARDIOGRAM (TEE);  Surgeon: Rexene Alberts, MD;  Location: [place];  Service: Open Heart Surgery;  Laterality: N/A;   TONSILECTOMY/ADENOIDECTOMY WITH MYRINGOTOMY  1960   TRANSESOPHAGEAL ECHOCARDIOGRAM  09/17/2016   Severe MR with ruptured chordae.  Mod/severe AR w/functional bicuspid AV.  EF 45%.   TRANSTHORACIC ECHOCARDIOGRAM  07/2015   Bicuspid aortic valve + mod mitral regurg   TRANSTHORACIC ECHOCARDIOGRAM  02/01/2017   (Post aortic root/valve surgery): EF 35%, diffuse hypokinesis, LV dilatation, grade I DD. Cardiologist added ARB and increased his  BB dose.  Lasix prn added 06/2017 due to some LE edema.   TRANSTHORACIC ECHOCARDIOGRAM  05/2017; 11/28/20   05/2017 LVEF 45-50%.  Grd II DD.  Mechanical AV good, MV repair good. 11/2020 EF 56-21%, no diastolic dysfxn, mech AV good, MV repair good.   Vocal cord biopsy  09/2015   No atypia or inflammation.  Some fibrous changes that are possibly from hx of radiation therapy.    FAMILY HISTORY:  Family History  Problem Relation Age of Onset   Heart disease Father    Breast cancer Mother    Breast cancer Sister    Diabetes Brother    Kidney failure Son    Breast  cancer Sister     SOCIAL HISTORY:  Social History   Socioeconomic History   Marital status: Widowed    Spouse name: Not on file   Number of children: 1   Years of education: Not on file   Highest education level: Not on file  Occupational History   Occupation: retired  Tobacco Use   Smoking status: Former    Types: Cigarettes    Quit date: 07/21/1989    Years since quitting: 33.1   Smokeless tobacco: Never  Vaping Use   Vaping Use: Never used  Substance and Sexual Activity   Alcohol use: Yes    Comment: rare   Drug use: No   Sexual activity: Not on file  Other Topics Concern   Not on file  Social History Narrative   Widower (approx 04/02/2014), one son deceased 04-03-2011.   Relocated from [place] to [facility] 06/2013 to be near grandchildren.   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   Smoker 20 pack-yr hx: quit about 1980.   Alcohol: 3 bottles of beer per week avg.     Drugs: none         Social Determinants of Health   Financial Resource Strain: Low Risk  (03/12/2022)   Overall Financial Resource Strain (CARDIA)    Difficulty of Paying Living Expenses: Not hard at all  Food Insecurity: No Food Insecurity (03/12/2022)   Hunger Vital Sign    Worried About Running Out of Food in the Last Year: Never true    Ran Out of Food in the Last Year: Never true  Transportation Needs: No Transportation Needs (03/12/2022)   PRAPARE - Hydrologist (Medical): No    Lack of Transportation (Non-Medical): No  Physical Activity: Inactive (03/12/2022)   Exercise Vital Sign    Days of Exercise per Week: 0 days    Minutes of Exercise per Session: 0 min  Stress: No Stress Concern Present (03/12/2022)   [place]    Feeling of Stress : Only a little  Social Connections: Moderately Isolated (03/12/2022)   Social Connection and Isolation Panel [NHANES]    Frequency of Communication with Friends and Family: More than three  times a week    Frequency of Social Gatherings with Friends and Family: Once a week    Attends Religious Services: More than 4 times per year    Active Member of [employer] or Organizations: No    Attends Archivist Meetings: Never    Marital Status: Widowed  Intimate Partner Violence: Not At Risk (03/12/2022)   Humiliation, Afraid, Rape, and Kick questionnaire    Fear of Current or Ex-Partner: No    Emotionally Abused: No    Physically Abused: No    Sexually Abused: No  ALLERGIES: Patient has no known allergies.  MEDICATIONS:  Current Outpatient Medications  Medication Sig Dispense Refill   acetaminophen (TYLENOL) 500 MG tablet Take 500-1,000 mg by mouth every 6 (six) hours as needed for mild pain (depends on pain level if takes 500 mg or 1000 mg).     aspirin EC 81 MG EC tablet Take 1 tablet (81 mg total) by mouth daily.     fenofibrate (TRICOR) 145 MG tablet TAKE 1 TABLET BY MOUTH DAILY 90 tablet 3   furosemide (LASIX) 20 MG tablet Take 1 tablet (20 mg total) by mouth daily as needed for fluid or edema (2 or more pounds). 30 tablet 1   irbesartan (AVAPRO) 300 MG tablet TAKE 1 TABLET BY MOUTH EVERY DAY 90 tablet 3   metoprolol succinate (TOPROL-XL) 50 MG 24 hr tablet TAKE 1 TABLET BY MOUTH EVERY DAY 90 tablet 3   Multiple Vitamins-Minerals (MULTIVITAMIN PO) Take 1 tablet by mouth daily.      pantoprazole (PROTONIX) 40 MG tablet TAKE 1 TABLET BY MOUTH EVERY DAY 90 tablet 3   warfarin (COUMADIN) 5 MG tablet Take 1/2-1 tablet daily as directed by the coumadin clinic. 90 tablet 0   No current facility-administered medications for this encounter.    REVIEW OF SYSTEMS:  On review of systems, the patient reports that he is doing well overall. He denies any chest pain, shortness of breath, cough, fevers, chills, night sweats, unintended weight changes. He denies any bowel disturbances, and denies abdominal pain, nausea or vomiting. He denies any new musculoskeletal or joint aches or  pains. His IPSS was ***, indicating *** urinary symptoms. His SHIM was ***, indicating he {does not have/has mild/moderate/severe} erectile dysfunction. A complete review of systems is obtained and is otherwise negative.    PHYSICAL EXAM:  Wt Readings from Last 3 Encounters:  07/15/22 215 lb (97.5 kg)  04/20/22 216 lb (98 kg)  11/06/21 217 lb 3.2 oz (98.5 kg)   Temp Readings from Last 3 Encounters:  07/15/22 97.9 F (36.6 C) (Oral)  11/06/21 97.9 F (36.6 C) (Oral)  06/05/21 97.9 F (36.6 C) (Oral)   BP Readings from Last 3 Encounters:  07/15/22 115/74  04/20/22 120/76  11/06/21 109/62   Pulse Readings from Last 3 Encounters:  07/15/22 62  04/20/22 61  11/06/21 (!) 55    /10  In general this is a well appearing *** male in no acute distress. He's alert and oriented x4 and appropriate throughout the examination. Cardiopulmonary assessment is negative for acute distress, and he exhibits normal effort.     KPS = ***  100 - Normal; no complaints; no evidence of disease. 90   - Able to carry on normal activity; minor signs or symptoms of disease. 80   - Normal activity with effort; some signs or symptoms of disease. 17   - Cares for self; unable to carry on normal activity or to do active work. 60   - Requires occasional assistance, but is able to care for most of his personal needs. 50   - Requires considerable assistance and frequent medical care. 83   - Disabled; requires special care and assistance. 5   - Severely disabled; hospital admission is indicated although death not imminent. 13   - Very sick; hospital admission necessary; active supportive treatment necessary. 10   - Moribund; fatal processes progressing rapidly. 0     - Dead  Karnofsky DA, Abelmann WH, Craver LS and Burchenal [facility] [phone]) The use of  the nitrogen mustards in the palliative treatment of carcinoma: with particular reference to bronchogenic carcinoma Cancer 1 634-56  LABORATORY DATA:  Lab Results   Component Value Date   WBC 5.1 07/15/2022   HGB 14.1 07/15/2022   HCT 43.1 07/15/2022   MCV 88.9 07/15/2022   PLT 182 07/15/2022   Lab Results  Component Value Date   NA 141 07/15/2022   K 4.2 07/15/2022   CL 110 07/15/2022   CO2 25 07/15/2022   Lab Results  Component Value Date   ALT 18 11/06/2021   AST 26 11/06/2021   ALKPHOS 43 11/06/2021   BILITOT 0.8 11/06/2021     RADIOGRAPHY: NM PET (PSMA) SKULL TO MID THIGH  Result Date: 08/14/2022 CLINICAL DATA:  Diagnosis prostate carcinoma.  PSA equal 4 9 EXAM: NUCLEAR MEDICINE PET SKULL BASE TO THIGH TECHNIQUE: 9.3 mCi F18 Piflufolastat (Pylarify) was injected intravenously. Full-ring PET imaging was performed from the skull base to thigh after the radiotracer. CT data was obtained and used for attenuation correction and anatomic localization. COMPARISON:  Prostate MRI 6243 FINDINGS: NECK No radiotracer activity in neck lymph nodes. Incidental CT finding: None. CHEST No radiotracer accumulation within mediastinal or hilar lymph nodes. No suspicious pulmonary nodules on the CT scan. Incidental CT finding: Esophageal lymph node measuring 6 mm (image 119/CT series 4) without radiotracer activity ABDOMEN/PELVIS Prostate: Heterogeneous radiotracer activity within the prostate gland without focality. Lymph nodes: No abnormal radiotracer accumulation within iliac or abdominal nodes. Mild radiotracer activity associated with bilateral inguinal lymph nodes. For example RIGHT inguinal lymph node measuring 9 mm (image 2 22/CT series 4) with SUV max equal 3.5. Similar activity on the LEFT. Liver: No evidence of liver metastasis. Incidental CT finding: Simple fluid attenuation LEFT renal cysts. Left Bosniak 2 benign cyst. No follow-up imaging is recommended. JACR 2018 Feb; 264-273, Management of the Incidental RenalMass on CT, RadioGraphics 2021; 814-848, Bosniak Classification of Cystic Renal Masses, Version 2019. SKELETON No focal activity to suggest  skeletal metastasis. IMPRESSION: 1. No prostate cancer clearly identified within prostate gland. Heterogeneous radiotracer activity. 2. No evidence metastatic adenopathy in the pelvis or periaortic retroperitoneum. Mild radiotracer activity associated with inguinal lymph nodes is favored benign physiologic / reactive. 3. No evidence of visceral metastasis or skeletal metastasis. 4. Incidental findings as above. Electronically Signed   By: Suzy Bouchard M.D.   On: 08/14/2022 08:17      IMPRESSION/PLAN: 1. 76 y.o. gentleman with Stage T1c adenocarcinoma of the prostate with Gleason Score of 3+4, and PSA of 4.87. We discussed the patient's workup and outlined the nature of prostate cancer in this setting. The patient's T stage, Gleason's score, and PSA put him into the favorable intermediate risk group. Accordingly, he is eligible for a variety of potential treatment options including brachytherapy, 5.5 weeks of external radiation, or prostatectomy. We discussed the available radiation techniques, and focused on the details and logistics of delivery. We discussed and outlined the risks, benefits, short and long-term effects associated with radiotherapy and compared and contrasted these with prostatectomy. We discussed the role of SpaceOAR gel in reducing the rectal toxicity associated with radiotherapy. He appears to have a good understanding of his disease and our treatment recommendations which are of curative intent.  He was encouraged to ask questions that were answered to his stated satisfaction.  At the conclusion of our conversation, the patient is interested in moving forward with ***.  We personally spent *** minutes in this encounter including chart review, reviewing radiological  studies, meeting face-to-face with the patient, entering orders and completing documentation.   ------------------------------------------------   Tyler Pita, MD [place]:  [phone]  Fax: [phone] [email or web address]  Skype  [email or web address]   This document serves as a record of services personally performed by Tyler Pita, MD. It was created on his behalf by Wilburn Mylar, a trained medical scribe. The creation of this record is based on the scribe's personal observations and the provider's statements to them. This document has been checked and approved by the attending provider.

## 2022-08-31 ENCOUNTER — Ambulatory Visit
Admission: RE | Admit: 2022-08-31 | Discharge: 2022-08-31 | Disposition: A | Discharge status: Home / Self Care | Payer: PPO | Source: Ambulatory Visit | Attending: Radiation Oncology | Admitting: Radiation Oncology

## 2022-08-31 ENCOUNTER — Other Ambulatory Visit: Payer: Self-pay

## 2022-08-31 VITALS — BP 121/65 | HR 57 | Temp 97.5°F | Resp 17 | Ht 72.0 in | Wt 219.4 lb

## 2022-08-31 DIAGNOSIS — Z803 Family history of malignant neoplasm of breast: Secondary | ICD-10-CM | POA: Insufficient documentation

## 2022-08-31 DIAGNOSIS — Z85828 Personal history of other malignant neoplasm of skin: Secondary | ICD-10-CM | POA: Diagnosis not present

## 2022-08-31 DIAGNOSIS — Z191 Hormone sensitive malignancy status: Secondary | ICD-10-CM | POA: Diagnosis not present

## 2022-08-31 DIAGNOSIS — Z79899 Other long term (current) drug therapy: Secondary | ICD-10-CM | POA: Diagnosis not present

## 2022-08-31 DIAGNOSIS — Z8521 Personal history of malignant neoplasm of larynx: Secondary | ICD-10-CM | POA: Insufficient documentation

## 2022-08-31 DIAGNOSIS — E781 Pure hyperglyceridemia: Secondary | ICD-10-CM | POA: Insufficient documentation

## 2022-08-31 DIAGNOSIS — Z923 Personal history of irradiation: Secondary | ICD-10-CM | POA: Insufficient documentation

## 2022-08-31 DIAGNOSIS — Z87891 Personal history of nicotine dependence: Secondary | ICD-10-CM | POA: Insufficient documentation

## 2022-08-31 DIAGNOSIS — I429 Cardiomyopathy, unspecified: Secondary | ICD-10-CM | POA: Diagnosis not present

## 2022-08-31 DIAGNOSIS — Z7901 Long term (current) use of anticoagulants: Secondary | ICD-10-CM | POA: Diagnosis not present

## 2022-08-31 DIAGNOSIS — I341 Nonrheumatic mitral (valve) prolapse: Secondary | ICD-10-CM | POA: Insufficient documentation

## 2022-08-31 DIAGNOSIS — K219 Gastro-esophageal reflux disease without esophagitis: Secondary | ICD-10-CM | POA: Diagnosis not present

## 2022-08-31 DIAGNOSIS — C61 Malignant neoplasm of prostate: Secondary | ICD-10-CM | POA: Diagnosis not present

## 2022-08-31 DIAGNOSIS — Z7982 Long term (current) use of aspirin: Secondary | ICD-10-CM | POA: Insufficient documentation

## 2022-08-31 DIAGNOSIS — N281 Cyst of kidney, acquired: Secondary | ICD-10-CM | POA: Insufficient documentation

## 2022-08-31 NOTE — Progress Notes (Signed)
Introduced myself to the patient as the prostate nurse navigator.  No barriers to care identified at this time.  He is here to discuss his radiation treatment options.  I gave him my business card and asked him to call me with questions or concerns.  Verbalized understanding.  ?

## 2022-09-02 ENCOUNTER — Other Ambulatory Visit: Payer: Self-pay | Admitting: Urology

## 2022-09-02 ENCOUNTER — Telehealth: Payer: Self-pay | Admitting: Cardiology

## 2022-09-02 ENCOUNTER — Telehealth: Payer: Self-pay | Admitting: *Deleted

## 2022-09-02 NOTE — Telephone Encounter (Signed)
Called patient to inform of pre-seed appts. for 10-22-22 and his implant on 12-11-22, spoke with patient and he is aware of these appts.

## 2022-09-02 NOTE — Telephone Encounter (Signed)
   Pre-operative Risk Assessment    Patient Name: Kenneth Ryan  DOB: [birth year] MRN: [id number]      Request for Surgical Clearance    Procedure:   Radioactive seed implant for prostate cancer  Date of Surgery:  Clearance 12/11/22                                 Surgeon:  Dr. [name] Surgeon's Group or Practice Name:  [facility] Phone number:  [phone] Ext [phone] Fax number:  [phone]   Type of Clearance Requested:   - Medical  - Pharmacy:  Hold Aspirin and Warfarin (Coumadin) Aspirin hold for 5 days Coumadin hold 3-5 days   Type of Anesthesia:  General    Additional requests/questions:      Marquita Palms   09/02/2022, 4:07 PM

## 2022-09-10 NOTE — Telephone Encounter (Signed)
Patient with diagnosis of AVR with On-X valve on warfarin for anticoagulation.    Procedure: Radioactive seed implant for prostate cancer Date of procedure: 12/11/22   CrCl 86 mL/min Platelet count 182K   Per office protocol, patient can hold warfarin  for 5 days prior to procedure.    Patient will not need bridging with Lovenox (enoxaparin) around procedure.  **This guidance is not considered finalized until pre-operative APP has relayed final recommendations.**

## 2022-09-10 NOTE — Telephone Encounter (Signed)
I am coming on board today to cover preop.  Will route to pharm for Coumadin.  Will route to Dr. [name] if OK to hold ASA. No hx of CAD/MI so falls outside preop DAPT algorithm - had S/P Bentall aortic root replacement and mitral valve repair with  reimplantation of left main and right coronary arteries. Dr. [name], - Please route response to P CV DIV PREOP (the pre-op pool). Thank you.  Last OV 04/2022 with some mild symptoms but feeling better at 06/2022 VV so anticipate repeat VV to ensure stable then hope to clear.

## 2022-09-11 ENCOUNTER — Telehealth: Payer: Self-pay | Admitting: *Deleted

## 2022-09-11 ENCOUNTER — Ambulatory Visit: Payer: PPO | Attending: Cardiology | Admitting: *Deleted

## 2022-09-11 DIAGNOSIS — Z9889 Other specified postprocedural states: Secondary | ICD-10-CM | POA: Diagnosis not present

## 2022-09-11 DIAGNOSIS — Z5181 Encounter for therapeutic drug level monitoring: Secondary | ICD-10-CM

## 2022-09-11 LAB — POCT INR: INR: 2.5 (ref 2.0–3.0)

## 2022-09-11 NOTE — Telephone Encounter (Signed)
Pt agreeable to plan of care for tele pre op appt 11/30/22 @ 10 am. Med rec and consent are done.      Patient Consent for Virtual Visit        Kenneth Ryan has provided verbal consent on 09/11/2022 for a virtual visit (video or telephone).   CONSENT FOR VIRTUAL VISIT FOR:  Kenneth Ryan  By participating in this virtual visit I agree to the following:  I hereby voluntarily request, consent and authorize [place] and its employed or contracted physicians, physician assistants, nurse practitioners or other licensed health care professionals (the Practitioner), to provide me with telemedicine health care services (the "Services") as deemed necessary by the treating Practitioner. I acknowledge and consent to receive the Services by the Practitioner via telemedicine. I understand that the telemedicine visit will involve communicating with the Practitioner through live audiovisual communication technology and the disclosure of certain medical information by electronic transmission. I acknowledge that I have been given the opportunity to request an in-person assessment or other available alternative prior to the telemedicine visit and am voluntarily participating in the telemedicine visit.  I understand that I have the right to withhold or withdraw my consent to the use of telemedicine in the course of my care at any time, without affecting my right to future care or treatment, and that the Practitioner or I may terminate the telemedicine visit at any time. I understand that I have the right to inspect all information obtained and/or recorded in the course of the telemedicine visit and may receive copies of available information for a reasonable fee.  I understand that some of the potential risks of receiving the Services via telemedicine include:  Delay or interruption in medical evaluation due to technological equipment failure or disruption; Information transmitted may not be  sufficient (e.g. poor resolution of images) to allow for appropriate medical decision making by the Practitioner; and/or  In rare instances, security protocols could fail, causing a breach of personal health information.  Furthermore, I acknowledge that it is my responsibility to provide information about my medical history, conditions and care that is complete and accurate to the best of my ability. I acknowledge that Practitioner's advice, recommendations, and/or decision may be based on factors not within their control, such as incomplete or inaccurate data provided by me or distortions of diagnostic images or specimens that may result from electronic transmissions. I understand that the practice of medicine is not an exact science and that Practitioner makes no warranties or guarantees regarding treatment outcomes. I acknowledge that a copy of this consent can be made available to me via my patient portal (Agenda), or I can request a printed copy by calling the office of [facility].    I understand that my insurance will be billed for this visit.   I have read or had this consent read to me. I understand the contents of this consent, which adequately explains the benefits and risks of the Services being provided via telemedicine.  I have been provided ample opportunity to ask questions regarding this consent and the Services and have had my questions answered to my satisfaction. I give my informed consent for the services to be provided through the use of telemedicine in my medical care

## 2022-09-11 NOTE — Telephone Encounter (Signed)
Pt agreeable to plan of care for tele pre op appt 11/30/22 @ 10 am. Med rec and consent are done.

## 2022-09-11 NOTE — Telephone Encounter (Signed)
   Name: Kenneth Ryan  DOB: [birth year]  MRN: [id number]  Primary Cardiologist: Candee Furbish, MD   Preoperative team, please contact this patient and set up a phone call appointment for further preoperative risk assessment. Please obtain consent and complete medication review. Thank you for your help.  I confirm that guidance regarding antiplatelet and oral anticoagulation therapy has been completed and, if necessary, noted below.   Charlie Pitter, PA-C 09/11/2022, 6:49 AM [place]

## 2022-09-11 NOTE — Patient Instructions (Signed)
Description   continue taking warfarin 1/2 tablet daily except for 1 tablet on Sunday and Thursday.  Recheck INR 5 weeks. Stay consistent with greens. [facility] [phone]

## 2022-10-09 ENCOUNTER — Telehealth: Payer: Self-pay | Admitting: *Deleted

## 2022-10-09 NOTE — Telephone Encounter (Signed)
Called patient to inform that implant date has been changed to 12-25-22 @ 7:30 am, spoke with patient and he is aware that his implant date has changed and he is good with this

## 2022-10-16 ENCOUNTER — Ambulatory Visit: Payer: PPO | Attending: Cardiology | Admitting: *Deleted

## 2022-10-16 DIAGNOSIS — Z9889 Other specified postprocedural states: Secondary | ICD-10-CM | POA: Diagnosis not present

## 2022-10-16 DIAGNOSIS — I7121 Aneurysm of the ascending aorta, without rupture: Secondary | ICD-10-CM | POA: Diagnosis not present

## 2022-10-16 DIAGNOSIS — Z5181 Encounter for therapeutic drug level monitoring: Secondary | ICD-10-CM | POA: Diagnosis not present

## 2022-10-16 DIAGNOSIS — Z954 Presence of other heart-valve replacement: Secondary | ICD-10-CM | POA: Diagnosis not present

## 2022-10-16 LAB — POCT INR: INR: 2.6 (ref 2.0–3.0)

## 2022-10-16 NOTE — Patient Instructions (Signed)
Description   Please have a large green veggie tonight then continue taking warfarin 1/2 tablet daily except for 1 tablet on Sunday and Thursday. Recheck INR 5 weeks. Stay consistent with greens. [facility]madin Clinic [phone]

## 2022-10-20 ENCOUNTER — Telehealth: Payer: Self-pay | Admitting: *Deleted

## 2022-10-20 NOTE — Telephone Encounter (Signed)
CALLED PATIENT TO REMIND OF PRE-SEED APPTS. FOR 10-22-22, SPOKE WITH PATIENT AND HE IS AWARE OF THESE APPTS.

## 2022-10-21 ENCOUNTER — Encounter: Payer: Self-pay | Admitting: Family Medicine

## 2022-10-21 NOTE — Progress Notes (Signed)
Radiation Oncology         [phone] ________________________________  Outpatient Follow up- Pre-seed visit  Name: Kenneth Ryan MRN: [id number]  Date: 10/22/2022  DOB: 01-Jan-1946  CC:Ryan, Kenneth Blackwater, MD  Ryan, Kenneth Blackwater, MD   REFERRING PHYSICIAN: Tammi Sou, MD  DIAGNOSIS: 76 y.o. gentleman with with Stage T1c adenocarcinoma of the prostate with Gleason score of 3+4, and PSA of 4.87.     ICD-10-CM   1. Malignant neoplasm of prostate ([place])  C61       HISTORY OF PRESENT ILLNESS: Kenneth Ryan is a 76 y.o. male with a diagnosis of prostate cancer. He has been followed by Dr. Jeffie Ryan for a fluctuating PSA since 2017. More recently, his PSA has remained persistently elevated at 4.58 in 10/2021 and 4.87 on repeat 04/30/2022. Dr. Jeffie Ryan requested ExoDx testing, which showed a score of 55.31 indicating an increased risk of high-grade prostate cancer. This prompted further evaluation with a prostate MRI which was performed on 06/13/22 showing two PI-RADS 4 lesions in the peripheral zone-- a 2.6 cm in the bilateral posteromedial and left posterolateral apex, and a 1 cm in the right posterolateral mid. The patient proceeded to MRI fusion biopsy of the prostate on 07/31/22.  The prostate volume measured 46 cc.  Out of 18 core biopsies, 15 were positive.  The maximum Gleason score was 3+4, and this was seen in the right mid lateral (with perineural invasion), one core of 4 cores from ROI #1, and one of 2 cores from ROI #2 (with PNI). Additionally, Gleason 3+3 was seen in the other three cores from ROI #1, the second core from ROI #2, and eight of the standard core biopsies.   A PSMA PET scan was performed on 08/12/22 for disease staging and showed heterogeneous radiotracer activity within the prostate gland without evidence of metastatic adenopathy, visceral metastasis, or skeletal metastasis. There was mild activity in the inguinal lymph nodes, favored benign.  The patient reviewed the  biopsy results with his urologist and was kindly referred to [place] for discussion of potential radiation treatment options. We initially met the patient on 08/31/22 and he was most interested in proceeding with brachytherapy and SpaceOAR gel placement for treatment of his disease. He is here today for his pre-procedure imaging for planning and to answer any additional questions he may have about this treatment.   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 5.8% Nov 2021   Increased prostate specific antigen (PSA) velocity 09/2016   saw Dr. Jeffie Ryan 11/18/16: plan is to repeat PSA 6 wks and if still same/up then plan TRUS+ bx.  PSA's declined on recheck x 2 and as of 03/10/17 was 2.5.  Recommended annual PSA's with me, f/u with urol  prn.  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  Per CV surg, as of 02/16/17, his INR goal is 1.5-2.5.   s/p Bentall procedure for [place] root aneurysm [place] 11/03/2016   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   Complex valvuloplasty including artificial Gore-tex neochord placement x6 and 28 mm Sorin Memo 3D ring annuloplasty      PAST SURGICAL HISTORY: Past Surgical History:  Procedure Laterality Date   BENTALL PROCEDURE N/A 11/03/2016   Procedure: BENTALL PROCEDURE AORTIC ROOT REPLACEMENT + mechanical AV;  Surgeon: CRexene Alberts MD;  [facility] [facility]  Service: Open Heart Surgery;  Laterality: N/A;   BIOPSY  06/05/2021   Procedure: BIOPSY;  Surgeon: BThornton Park MD;  [facility];  Service: Gastroenterology;;   CARDIAC CATHETERIZATION N/A 09/29/2016   Clean coronaries, EF 60-65%.  Procedure: Right/Left Heart Cath and Coronary Angiography;  Surgeon: JJettie Booze MD;  [facility];  Service: Cardiovascular;  Laterality: N/A;   CHOLECYSTECTOMY  1980   COLONOSCOPY  2003 and 2013   Both normal.   COLONOSCOPY WITH PROPOFOL N/A 06/05/2021   diverticulosis descending and sigmoid, int+ext nonbleeding hemorr, 1 sessile seratted polyp w/out cytologic dysplasia. Procedure: COLONOSCOPY WITH PROPOFOL;  Surgeon: BThornton Park MD;  [facility];  Service: Gastroenterology;  Laterality: N/A;   DIRECT LARYNGOSCOPY Left 10/17/2015   Procedure: MICRO DIRECT LARYNGOSCOPY WITH FROZEN SECTION;  Surgeon:  KJodi Marble MD;  [place] [facility]  Service: ENT;  Laterality: Left;   ESOPHAGOGASTRODUODENOSCOPY (EGD) WITH PROPOFOL N/A 06/05/2021   Barrett's esoph.  Gastritis (h pylori neg), multiple benign gastric polyps. Procedure: ESOPHAGOGASTRODUODENOSCOPY (EGD) WITH PROPOFOL;  Surgeon: BThornton Park MD;  [facility];  Service: Gastroenterology;  Laterality: N/A;   MITRAL VALVE REPAIR N/A 11/03/2016   Procedure: MITRAL VALVE REPAIR (MVR);  Surgeon: CRexene Alberts MD;  [place] [facility]  Service: Open Heart Surgery;  Laterality: N/A;   PERIPHERAL VASCULAR CATHETERIZATION N/A 09/29/2016   Procedure: Abdominal Aortogram;  Surgeon: JJettie Booze MD;  [facility];  Service: Cardiovascular;  Laterality: N/A;   PERIPHERAL VASCULAR CATHETERIZATION N/A 09/29/2016   Procedure: Thoracic Aortogram;  Surgeon: JJettie Booze MD;  [facility];  Service: Cardiovascular;  Laterality: N/A;   POLYPECTOMY  06/05/2021   Procedure: POLYPECTOMY;  Surgeon: BThornton Park MD;  [facility];  Service: Gastroenterology;;   TEE WITHOUT CARDIOVERSION N/A 09/17/2016   Procedure: TRANSESOPHAGEAL ECHOCARDIOGRAM (TEE);  Surgeon: MJerline Pain MD;  [facility];  Service: Cardiovascular;  Laterality: N/A;   TEE WITHOUT CARDIOVERSION N/A 11/03/2016   Procedure: TRANSESOPHAGEAL ECHOCARDIOGRAM (TEE);  Surgeon: Rexene Alberts, MD;  Location: [place];  Service: Open Heart Surgery;  Laterality: N/A;   TONSILECTOMY/ADENOIDECTOMY WITH MYRINGOTOMY  1960   TRANSESOPHAGEAL ECHOCARDIOGRAM  09/17/2016   Severe MR with ruptured chordae.  Mod/severe AR w/functional bicuspid AV.  EF 45%.   TRANSTHORACIC ECHOCARDIOGRAM  07/2015   Bicuspid aortic valve + mod mitral regurg   TRANSTHORACIC ECHOCARDIOGRAM  02/01/2017   (Post aortic root/valve surgery): EF 35%, diffuse hypokinesis, LV dilatation, grade I DD. Cardiologist added ARB and increased his BB dose.  Lasix prn  added 06/2017 due to some LE edema.   TRANSTHORACIC ECHOCARDIOGRAM  05/2017; 11/28/20   05/2017 LVEF 45-50%.  Grd II DD.  Mechanical AV good, MV repair good. 11/2020 EF 20-25%, no diastolic dysfxn, mech AV good, MV repair good.   Vocal cord biopsy  09/2015   No atypia or inflammation.  Some fibrous changes that are possibly from hx of radiation therapy.    FAMILY HISTORY:  Family History  Problem Relation Age of Onset   Heart disease Father    Breast cancer Mother    Breast cancer Sister    Diabetes Brother    Kidney failure Son    Breast cancer Sister     SOCIAL HISTORY:  Social History   Socioeconomic History   Marital status: Widowed    Spouse name: Not on file   Number of children: 1   Years of education: Not on file   Highest education level: Not on file  Occupational History   Occupation: retired  Tobacco Use   Smoking status: Former    Types: Cigarettes    Quit date: 07/21/1989    Years since quitting: 33.2   Smokeless tobacco: Never  Vaping Use   Vaping Use: Never used  Substance and Sexual Activity   Alcohol use: Yes    Comment: rare   Drug use: No   Sexual activity: Not on file  Other Topics Concern   Not on file  Social History Narrative   Widower (approx 15-Feb-2014), one son deceased Feb 15, 2011.   Relocated from [place] to [facility] 06/2013 to be near grandchildren.   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   Smoker 20 pack-yr hx: quit about 1980.   Alcohol: 3 bottles of beer per week avg.     Drugs: none         Social Determinants of Health   Financial Resource Strain: Low Risk  (03/12/2022)   Overall Financial Resource Strain (CARDIA)    Difficulty of Paying Living Expenses: Not hard at all  Food Insecurity: No Food Insecurity (03/12/2022)   Hunger Vital Sign    Worried About Running Out of Food in the Last Year: Never true    Ran Out of Food in the Last Year: Never true  Transportation Needs: No Transportation Needs (03/12/2022)   PRAPARE - Radiographer, therapeutic (Medical): No    Lack of Transportation (Non-Medical): No  Physical Activity: Inactive (03/12/2022)   Exercise Vital Sign    Days of Exercise per Week: 0 days    Minutes of Exercise per Session: 0 min  Stress: No Stress Concern Present (03/12/2022)   [place]    Feeling of Stress : Only a little  Social Connections: Moderately Isolated (03/12/2022)   Social Connection and Isolation Panel [NHANES]  Frequency of Communication with Friends and Family: More than three times a week    Frequency of Social Gatherings with Friends and Family: Once a week    Attends Religious Services: More than 4 times per year    Active Member of [employer] or Organizations: No    Attends Archivist Meetings: Never    Marital Status: Widowed  Intimate Partner Violence: Not At Risk (03/12/2022)   Humiliation, Afraid, Rape, and Kick questionnaire    Fear of Current or Ex-Partner: No    Emotionally Abused: No    Physically Abused: No    Sexually Abused: No    ALLERGIES: Patient has no known allergies.  MEDICATIONS:  Current Outpatient Medications  Medication Sig Dispense Refill   acetaminophen (TYLENOL) 500 MG tablet Take 500-1,000 mg by mouth every 6 (six) hours as needed for mild pain (depends on pain level if takes 500 mg or 1000 mg).     aspirin EC 81 MG EC tablet Take 1 tablet (81 mg total) by mouth daily.     fenofibrate (TRICOR) 145 MG tablet TAKE 1 TABLET BY MOUTH DAILY 90 tablet 3   furosemide (LASIX) 20 MG tablet Take 1 tablet (20 mg total) by mouth daily as needed for fluid or edema (2 or more pounds). 30 tablet 1   irbesartan (AVAPRO) 300 MG tablet TAKE 1 TABLET BY MOUTH EVERY DAY 90 tablet 3   metoprolol succinate (TOPROL-XL) 50 MG 24 hr tablet TAKE 1 TABLET BY MOUTH EVERY DAY 90 tablet 3   Multiple Vitamins-Minerals (MULTIVITAMIN PO) Take 1 tablet by mouth daily.      pantoprazole (PROTONIX) 40 MG tablet  TAKE 1 TABLET BY MOUTH EVERY DAY 90 tablet 3   warfarin (COUMADIN) 5 MG tablet Take 1/2-1 tablet daily as directed by the coumadin clinic. 90 tablet 0   No current facility-administered medications for this visit.    REVIEW OF SYSTEMS:  On review of systems, the patient reports that he is doing well overall. He denies any chest pain, shortness of breath, cough, fevers, chills, night sweats, unintended weight changes. He denies any bowel disturbances, and denies abdominal pain, nausea or vomiting. He denies any new musculoskeletal or joint aches or pains. His IPSS was 5, indicating mild urinary symptoms. His SHIM was 6, indicating he has severe erectile dysfunction. A complete review of systems is obtained and is otherwise negative.     PHYSICAL EXAM:  Wt Readings from Last 3 Encounters:  08/31/22 219 lb 6.4 oz (99.5 kg)  07/15/22 215 lb (97.5 kg)  04/20/22 216 lb (98 kg)   Temp Readings from Last 3 Encounters:  08/31/22 (!) 97.5 F (36.4 C) (Oral)  07/15/22 97.9 F (36.6 C) (Oral)  11/06/21 97.9 F (36.6 C) (Oral)   BP Readings from Last 3 Encounters:  08/31/22 121/65  07/15/22 115/74  04/20/22 120/76   Pulse Readings from Last 3 Encounters:  08/31/22 (!) 57  07/15/22 62  04/20/22 61    /10  In general this is a well appearing Caucasian male in no acute distress. He's alert and oriented x4 and appropriate throughout the examination. Cardiopulmonary assessment is negative for acute distress, and he exhibits normal effort.     KPS = 100  100 - Normal; no complaints; no evidence of disease. 90   - Able to carry on normal activity; minor signs or symptoms of disease. 80   - Normal activity with effort; some signs or symptoms of disease. 70   -  Cares for self; unable to carry on normal activity or to do active work. 60   - Requires occasional assistance, but is able to care for most of his personal needs. 50   - Requires considerable assistance and frequent medical care. 34    - Disabled; requires special care and assistance. 38   - Severely disabled; hospital admission is indicated although death not imminent. 59   - Very sick; hospital admission necessary; active supportive treatment necessary. 10   - Moribund; fatal processes progressing rapidly. 0     - Dead  Karnofsky DA, Abelmann [place], Craver LS and Burchenal [facility] [phone]) The use of the nitrogen mustards in the palliative treatment of carcinoma: with particular reference to bronchogenic carcinoma Cancer 1 634-56  LABORATORY DATA:  Lab Results  Component Value Date   WBC 5.1 07/15/2022   HGB 14.1 07/15/2022   HCT 43.1 07/15/2022   MCV 88.9 07/15/2022   PLT 182 07/15/2022   Lab Results  Component Value Date   NA 141 07/15/2022   K 4.2 07/15/2022   CL 110 07/15/2022   CO2 25 07/15/2022   Lab Results  Component Value Date   ALT 18 11/06/2021   AST 26 11/06/2021   ALKPHOS 43 11/06/2021   BILITOT 0.8 11/06/2021     RADIOGRAPHY: No results found.    IMPRESSION/PLAN: 1. 76 y.o. gentleman with Stage T1c adenocarcinoma of the prostate with Gleason score of 3+4, and PSA of 4.87.  The patient has elected to proceed with seed implant for treatment of his disease. We reviewed the risks, benefits, short and long-term effects associated with brachytherapy and discussed the role of SpaceOAR in reducing the rectal toxicity associated with radiotherapy.  He appears to have a good understanding of his disease and our treatment recommendations which are of curative intent.  He was encouraged to ask questions that were answered to his stated satisfaction. He has freely signed written consent to proceed today in the office and a copy of this document will be placed in his medical record. His procedure is tentatively scheduled for 12/25/22 in collaboration with Dr. Jeffie Ryan and we will see him back for his post-procedure visit approximately 3 weeks thereafter. We look forward to continuing to participate in his care. He knows that  he is welcome to call with any questions or concerns at any time in the interim.  I personally spent 30 minutes in this encounter including chart review, reviewing radiological studies, meeting face-to-face with the patient, entering orders and completing documentation.    [name], MMS, PA-C [place] [facility] [place]: [phone]  Fax: [phone]

## 2022-10-21 NOTE — Progress Notes (Signed)
  Radiation Oncology         [phone] ________________________________  Name: Kenneth Ryan MRN: [id number]  Date: 10/22/2022  DOB: 10-25-1946  SIMULATION AND TREATMENT PLANNING NOTE PUBIC ARCH STUDY  CC:[name], [name], MD  Tammi Sou, MD  DIAGNOSIS: 76 y.o. gentleman with Stage T1c adenocarcinoma of the prostate with Gleason score of 3+4, and PSA of 4.87.   Oncology History  Malignant neoplasm of prostate ([place])  07/31/2022 Cancer Staging   Staging form: Prostate, AJCC 8th Edition - Clinical stage from 07/31/2022: Stage IIB (cT1c, cN0, cM0, PSA: 4.9, Grade Group: 2) - Signed by Freeman Caldron, PA-C on 08/31/2022 Histopathologic type: Adenocarcinoma, NOS Stage prefix: Initial diagnosis Prostate specific antigen (PSA) range: Less than 10 Gleason primary pattern: 3 Gleason secondary pattern: 4 Gleason score: 7 Histologic grading system: 5 grade system Number of biopsy cores examined: 18 Number of biopsy cores positive: 15 Location of positive needle core biopsies: Both sides   08/31/2022 Initial Diagnosis   Malignant neoplasm of prostate ([place])       ICD-10-CM   1. Malignant neoplasm of prostate ([place])  C61       COMPLEX SIMULATION:  The patient presented today for evaluation for possible prostate seed implant. He was brought to the radiation planning suite and placed supine on the CT couch. A 3-dimensional image study set was obtained in upload to the planning computer. There, on each axial slice, I contoured the prostate gland. Then, using three-dimensional radiation planning tools I reconstructed the prostate in view of the structures from the transperineal needle pathway to assess for possible pubic arch interference. In doing so, I did not appreciate any pubic arch interference. Also, the patient's prostate volume was estimated based on the drawn structure. The volume was 46 cc.  Given the pubic arch appearance and prostate volume, patient remains a good candidate  to proceed with prostate seed implant. Today, he freely provided informed written consent to proceed.    PLAN: The patient will undergo prostate seed implant.   ________________________________  Sheral Apley. [name], M.D.

## 2022-10-22 ENCOUNTER — Encounter: Payer: Self-pay | Admitting: Urology

## 2022-10-22 ENCOUNTER — Ambulatory Visit
Admission: RE | Admit: 2022-10-22 | Discharge: 2022-10-22 | Disposition: A | Discharge status: Home / Self Care | Payer: PPO | Source: Ambulatory Visit | Attending: Radiation Oncology | Admitting: Radiation Oncology

## 2022-10-22 ENCOUNTER — Ambulatory Visit
Admission: RE | Admit: 2022-10-22 | Discharge: 2022-10-22 | Disposition: A | Discharge status: Home / Self Care | Payer: PPO | Source: Ambulatory Visit | Attending: Urology | Admitting: Urology

## 2022-10-22 ENCOUNTER — Other Ambulatory Visit: Payer: Self-pay

## 2022-10-22 VITALS — Resp 18 | Ht 72.0 in | Wt 220.0 lb

## 2022-10-22 DIAGNOSIS — C61 Malignant neoplasm of prostate: Secondary | ICD-10-CM | POA: Insufficient documentation

## 2022-10-22 DIAGNOSIS — Z510 Encounter for antineoplastic radiation therapy: Secondary | ICD-10-CM | POA: Insufficient documentation

## 2022-10-22 DIAGNOSIS — Z191 Hormone sensitive malignancy status: Secondary | ICD-10-CM | POA: Diagnosis not present

## 2022-10-22 NOTE — Progress Notes (Signed)
Pre-seed nursing appointment for 76yrold male w/ Stage T1c adenocarcinoma of the prostate with Gleason score of 3+4, and PSA of 4.87.  I verified patient's identity and began nursing interview. Patient doing well. No issues reported at this time.  Meaningful sue complete. No urinary management medications. [facility] WJeffie Pollock  Resp 18   Ht 6' (1.829 m)   Wt 220 lb (99.8 kg)   BMI 29.84 kg/m   This concludes the nursing interview.  MLeandra Kern LPN

## 2022-11-05 NOTE — Patient Instructions (Signed)
Health Maintenance, Male Adopting a healthy lifestyle and getting preventive care are important in promoting health and wellness. Ask your health care provider about: The right schedule for you to have regular tests and exams. Things you can do on your own to prevent diseases and keep yourself healthy. What should I know about diet, weight, and exercise? Eat a healthy diet  Eat a diet that includes plenty of vegetables, fruits, low-fat dairy products, and lean protein. Do not eat a lot of foods that are high in solid fats, added sugars, or sodium. Maintain a healthy weight Body mass index (BMI) is a measurement that can be used to identify possible weight problems. It estimates body fat based on height and weight. Your health care provider can help determine your BMI and help you achieve or maintain a healthy weight. Get regular exercise Get regular exercise. This is one of the most important things you can do for your health. Most adults should: Exercise for at least 150 minutes each week. The exercise should increase your heart rate and make you sweat (moderate-intensity exercise). Do strengthening exercises at least twice a week. This is in addition to the moderate-intensity exercise. Spend less time sitting. Even light physical activity can be beneficial. Watch cholesterol and blood lipids Have your blood tested for lipids and cholesterol at 74 years of age, then have this test every 5 years. You may need to have your cholesterol levels checked more often if: Your lipid or cholesterol levels are high. You are older than 74 years of age. You are at high risk for heart disease. What should I know about cancer screening? Many types of cancers can be detected early and may often be prevented. Depending on your health history and family history, you may need to have cancer screening at various ages. This may include screening for: Colorectal cancer. Prostate cancer. Skin cancer. Lung  cancer. What should I know about heart disease, diabetes, and high blood pressure? Blood pressure and heart disease High blood pressure causes heart disease and increases the risk of stroke. This is more likely to develop in people who have high blood pressure readings or are overweight. Talk with your health care provider about your target blood pressure readings. Have your blood pressure checked: Every 3-5 years if you are 18-39 years of age. Every year if you are 40 years old or older. If you are between the ages of 65 and 75 and are a current or former smoker, ask your health care provider if you should have a one-time screening for abdominal aortic aneurysm (AAA). Diabetes Have regular diabetes screenings. This checks your fasting blood sugar level. Have the screening done: Once every three years after age 45 if you are at a normal weight and have a low risk for diabetes. More often and at a younger age if you are overweight or have a high risk for diabetes. What should I know about preventing infection? Hepatitis B If you have a higher risk for hepatitis B, you should be screened for this virus. Talk with your health care provider to find out if you are at risk for hepatitis B infection. Hepatitis C Blood testing is recommended for: Everyone born from 1945 through 1965. Anyone with known risk factors for hepatitis C. Sexually transmitted infections (STIs) You should be screened each year for STIs, including gonorrhea and chlamydia, if: You are sexually active and are younger than 74 years of age. You are older than 74 years of age and your   health care provider tells you that you are at risk for this type of infection. Your sexual activity has changed since you were last screened, and you are at increased risk for chlamydia or gonorrhea. Ask your health care provider if you are at risk. Ask your health care provider about whether you are at high risk for HIV. Your health care provider  may recommend a prescription medicine to help prevent HIV infection. If you choose to take medicine to prevent HIV, you should first get tested for HIV. You should then be tested every 3 months for as long as you are taking the medicine. Follow these instructions at home: Alcohol use Do not drink alcohol if your health care provider tells you not to drink. If you drink alcohol: Limit how much you have to 0-2 drinks a day. Know how much alcohol is in your drink. In the [place] one drink equals one 12 oz bottle of beer (355 mL), one 5 oz glass of wine (148 mL), or one 1 oz glass of hard liquor (44 mL). Lifestyle Do not use any products that contain nicotine or tobacco. These products include cigarettes, chewing tobacco, and vaping devices, such as e-cigarettes. If you need help quitting, ask your health care provider. Do not use street drugs. Do not share needles. Ask your health care provider for help if you need support or information about quitting drugs. General instructions Schedule regular health, dental, and eye exams. Stay current with your vaccines. Tell your health care provider if: You often feel depressed. You have ever been abused or do not feel safe at home. Summary Adopting a healthy lifestyle and getting preventive care are important in promoting health and wellness. Follow your health care provider's instructions about healthy diet, exercising, and getting tested or screened for diseases. Follow your health care provider's instructions on monitoring your cholesterol and blood pressure. This information is not intended to replace advice given to you by your health care provider. Make sure you discuss any questions you have with your health care provider. Document Revised: 04/28/2021 Document Reviewed: 04/28/2021 Elsevier Patient Education  2023 Elsevier Inc.  

## 2022-11-09 ENCOUNTER — Ambulatory Visit (INDEPENDENT_AMBULATORY_CARE_PROVIDER_SITE_OTHER): Payer: PPO | Admitting: Family Medicine

## 2022-11-09 ENCOUNTER — Encounter: Payer: Self-pay | Admitting: Family Medicine

## 2022-11-09 VITALS — BP 120/52 | HR 54 | Temp 97.7°F | Ht 72.05 in | Wt 223.6 lb

## 2022-11-09 DIAGNOSIS — E781 Pure hyperglyceridemia: Secondary | ICD-10-CM

## 2022-11-09 DIAGNOSIS — R972 Elevated prostate specific antigen [PSA]: Secondary | ICD-10-CM | POA: Diagnosis not present

## 2022-11-09 DIAGNOSIS — M25511 Pain in right shoulder: Secondary | ICD-10-CM

## 2022-11-09 DIAGNOSIS — M79672 Pain in left foot: Secondary | ICD-10-CM

## 2022-11-09 DIAGNOSIS — R7301 Impaired fasting glucose: Secondary | ICD-10-CM | POA: Diagnosis not present

## 2022-11-09 DIAGNOSIS — Z0000 Encounter for general adult medical examination without abnormal findings: Secondary | ICD-10-CM

## 2022-11-09 DIAGNOSIS — I10 Essential (primary) hypertension: Secondary | ICD-10-CM

## 2022-11-09 LAB — LIPID PANEL
Cholesterol: 156 mg/dL (ref 0–200)
HDL: 38.5 mg/dL — ABNORMAL LOW (ref >39.00)
LDL Cholesterol: 83 mg/dL (ref 0–99)
NonHDL: 117.12
Total CHOL/HDL Ratio: 4
Triglycerides: 170 mg/dL — ABNORMAL HIGH (ref 0.0–149.0)
VLDL: 34 mg/dL (ref 0.0–40.0)

## 2022-11-09 LAB — COMPREHENSIVE METABOLIC PANEL
ALT: 15 U/L (ref 0–53)
AST: 24 U/L (ref 0–37)
Albumin: 4 g/dL (ref 3.5–5.2)
Alkaline Phosphatase: 43 U/L (ref 39–117)
BUN: 15 mg/dL (ref 6–23)
CO2: 29 mEq/L (ref 19–32)
Calcium: 9.2 mg/dL (ref 8.4–10.5)
Chloride: 106 mEq/L (ref 96–112)
Creatinine, Ser: 1.1 mg/dL (ref 0.40–1.50)
GFR: 66.14 mL/min (ref >60.00)
Glucose, Bld: 76 mg/dL (ref 70–99)
Potassium: 4.2 mEq/L (ref 3.5–5.1)
Sodium: 142 mEq/L (ref 135–145)
Total Bilirubin: 0.6 mg/dL (ref 0.2–1.2)
Total Protein: 6.4 g/dL (ref 6.0–8.3)

## 2022-11-09 LAB — HEMOGLOBIN A1C: Hgb A1c MFr Bld: 5.8 % (ref 4.6–6.5)

## 2022-11-09 LAB — PSA: PSA: 4.49

## 2022-11-09 LAB — CBC
HCT: 43.8 % (ref 39.0–52.0)
Hemoglobin: 14.8 g/dL (ref 13.0–17.0)
MCHC: 33.7 g/dL (ref 30.0–36.0)
MCV: 87.2 fl (ref 78.0–100.0)
Platelets: 200 10*3/uL (ref 150.0–400.0)
RBC: 5.03 Mil/uL (ref 4.22–5.81)
RDW: 14.5 % (ref 11.5–15.5)
WBC: 6.5 10*3/uL (ref 4.0–10.5)

## 2022-11-09 MED ORDER — FENOFIBRATE 145 MG PO TABS: ORAL_TABLET | ORAL | 3 refills | Status: DC

## 2022-11-09 NOTE — Progress Notes (Signed)
Office Note 11/09/2022  CC:  Chief Complaint  Patient presents with   Annual Exam    Pt is fasting    HPI:  Patient is a 76 y.o. male who is here for annual health maintenance exam and follow-up hypertension, hypertriglyceridemia, and impaired fasting glucose.  Has been diagnosed with prostate cancer by biopsy about 3 months ago.  Radioactive seed implants are planned.  Has had some mild right shoulder pain for about 6 months. No preceding injury or trauma. Hurts with reaching up and out.  Last few days feeling much better.  Some left heel pain lately.  Worse when up on it and walking.  Last 2 days has not hurt him at all.  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   Lesion of vocal cord    Mitral regurgitation    severe by TEE 08/2016:  MV repair done 11/03/16   Mitral valve prolapse    MV repair 10/2016   Nonischemic cardiomyopathy ([place]) 2018   dilated LV, systolic dysfunction: see echo results in Surgical hx section, 01/2017 and 05/2017.   Painless rectal bleeding 05/2021   EGD w/some gastritis, Colonoscopy with int/ext hemorrhoids, 1 polyp   Prostate cancer ([place])    07/2022 bx+.  PET  neg.   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  Per CV surg, as of 02/16/17, his INR goal is 1.5-2.5.   s/p Bentall procedure for aortic root aneurysm [place] 11/03/2016   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   Complex valvuloplasty including artificial Gore-tex neochord placement x6 and 28 mm Sorin Memo 3D ring annuloplasty    Past Surgical History:  Procedure Laterality Date   BENTALL PROCEDURE N/A 11/03/2016   Procedure: BENTALL PROCEDURE AORTIC ROOT REPLACEMENT + mechanical AV;  Surgeon: CRexene Alberts MD;  [facility]  Service: Open Heart Surgery;  Laterality: N/A;   BIOPSY  06/05/2021   Procedure: BIOPSY;  Surgeon: BThornton Park MD;  [facility];  Service: Gastroenterology;;   CARDIAC CATHETERIZATION N/A 09/29/2016   Clean coronaries, EF 60-65%.  Procedure: Right/Left Heart Cath and Coronary Angiography;  Surgeon: JJettie Booze MD;  [facility];  Service: Cardiovascular;  Laterality: N/A;   CHOLECYSTECTOMY  1980   COLONOSCOPY  2003 and 2013   Both normal.   COLONOSCOPY WITH PROPOFOL N/A 06/05/2021   diverticulosis descending and sigmoid, int+ext nonbleeding hemorr,  1 sessile seratted polyp w/out cytologic dysplasia. Procedure: COLONOSCOPY WITH PROPOFOL;  Surgeon: Thornton Park, MD;  [facility];  Service: Gastroenterology;  Laterality: N/A;   DIRECT LARYNGOSCOPY Left 10/17/2015   Procedure: MICRO DIRECT LARYNGOSCOPY WITH FROZEN SECTION;  Surgeon: Jodi Marble, MD;  Location: [place];  Service: ENT;  Laterality: Left;   ESOPHAGOGASTRODUODENOSCOPY (EGD) WITH PROPOFOL N/A 06/05/2021   Barrett's esoph.  Gastritis (h pylori neg), multiple benign gastric polyps. Procedure: ESOPHAGOGASTRODUODENOSCOPY  (EGD) WITH PROPOFOL;  Surgeon: Thornton Park, MD;  [facility];  Service: Gastroenterology;  Laterality: N/A;   MITRAL VALVE REPAIR N/A 11/03/2016   Procedure: MITRAL VALVE REPAIR (MVR);  Surgeon: Rexene Alberts, MD;  Location: [place];  Service: Open Heart Surgery;  Laterality: N/A;   PERIPHERAL VASCULAR CATHETERIZATION N/A 09/29/2016   Procedure: Abdominal Aortogram;  Surgeon: Jettie Booze, MD;  Location: [place] [facility];  Service: Cardiovascular;  Laterality: N/A;   PERIPHERAL VASCULAR CATHETERIZATION N/A 09/29/2016   Procedure: Thoracic Aortogram;  Surgeon: Jettie Booze, MD;  Location: [place] [facility];  Service: Cardiovascular;  Laterality: N/A;   POLYPECTOMY  06/05/2021   Procedure: POLYPECTOMY;  Surgeon: Thornton Park, MD;  [facility];  Service: Gastroenterology;;   TEE WITHOUT CARDIOVERSION N/A 09/17/2016   Procedure: TRANSESOPHAGEAL ECHOCARDIOGRAM (TEE);  Surgeon: Jerline Pain, MD;  Location: [place];  Service: Cardiovascular;  Laterality: N/A;   TEE WITHOUT CARDIOVERSION N/A 11/03/2016   Procedure: TRANSESOPHAGEAL ECHOCARDIOGRAM (TEE);  Surgeon: Rexene Alberts, MD;  Location: [place];  Service: Open Heart Surgery;  Laterality: N/A;   TONSILECTOMY/ADENOIDECTOMY WITH MYRINGOTOMY  1960   TRANSESOPHAGEAL ECHOCARDIOGRAM  09/17/2016   Severe MR with ruptured chordae.  Mod/severe AR w/functional bicuspid AV.  EF 45%.   TRANSTHORACIC ECHOCARDIOGRAM  07/2015   Bicuspid aortic valve + mod mitral regurg   TRANSTHORACIC ECHOCARDIOGRAM  02/01/2017   (Post aortic root/valve surgery): EF 35%, diffuse hypokinesis, LV dilatation, grade I DD. Cardiologist added ARB and increased his BB dose.  Lasix prn added 06/2017 due to some LE edema.   TRANSTHORACIC ECHOCARDIOGRAM  05/2017; 11/28/20   05/2017 LVEF 45-50%.  Grd II DD.  Mechanical AV good, MV repair good. 11/2020 EF 95-28%, no diastolic dysfxn, mech AV good, MV repair good.   TRANSTHORACIC  ECHOCARDIOGRAM     04/2022 normal pump, aortic and mitral valve replacements doing well   Vocal cord biopsy  09/2015   No atypia or inflammation.  Some fibrous changes that are possibly from hx of radiation therapy.    Family History  Problem Relation Age of Onset   Heart disease Father    Breast cancer Mother    Breast cancer Sister    Diabetes Brother    Kidney failure Son    Breast cancer Sister     Social History   Socioeconomic History   Marital status: Widowed    Spouse name: Not on file   Number of children: 1   Years of education: Not on file   Highest education level: Not on file  Occupational History   Occupation: retired  Tobacco Use   Smoking status: Former    Types: Cigarettes    Quit date: 07/21/1989    Years since quitting: 33.3   Smokeless tobacco: Never  Vaping Use   Vaping Use: Never used  Substance and Sexual Activity   Alcohol use: Yes    Comment: rare   Drug use: No   Sexual activity: Not on file  Other Topics  Concern   Not on file  Social History Narrative   Widower (approx 2014-02-19), one son deceased Feb 19, 2011.   Relocated from [place] to [facility] 06/2013 to be near grandchildren.   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   Smoker 20 pack-yr hx: quit about 1980.   Alcohol: 3 bottles of beer per week avg.     Drugs: none         Social Determinants of Health   Financial Resource Strain: Low Risk  (03/12/2022)   Overall Financial Resource Strain (CARDIA)    Difficulty of Paying Living Expenses: Not hard at all  Food Insecurity: No Food Insecurity (03/12/2022)   Hunger Vital Sign    Worried About Running Out of Food in the Last Year: Never true    Ran Out of Food in the Last Year: Never true  Transportation Needs: No Transportation Needs (03/12/2022)   PRAPARE - Hydrologist (Medical): No    Lack of Transportation (Non-Medical): No  Physical Activity: Inactive (03/12/2022)   Exercise Vital Sign    Days of Exercise per Week: 0 days     Minutes of Exercise per Session: 0 min  Stress: No Stress Concern Present (03/12/2022)   [place]    Feeling of Stress : Only a little  Social Connections: Moderately Isolated (03/12/2022)   Social Connection and Isolation Panel [NHANES]    Frequency of Communication with Friends and Family: More than three times a week    Frequency of Social Gatherings with Friends and Family: Once a week    Attends Religious Services: More than 4 times per year    Active Member of [employer] or Organizations: No    Attends Archivist Meetings: Never    Marital Status: Widowed  Intimate Partner Violence: Not At Risk (03/12/2022)   Humiliation, Afraid, Rape, and Kick questionnaire    Fear of Current or Ex-Partner: No    Emotionally Abused: No    Physically Abused: No    Sexually Abused: No    Outpatient Medications Prior to Visit  Medication Sig Dispense Refill   acetaminophen (TYLENOL) 500 MG tablet Take 500-1,000 mg by mouth every 6 (six) hours as needed for mild pain (depends on pain level if takes 500 mg or 1000 mg).     aspirin EC 81 MG EC tablet Take 1 tablet (81 mg total) by mouth daily.     fenofibrate (TRICOR) 145 MG tablet TAKE 1 TABLET BY MOUTH DAILY 90 tablet 3   furosemide (LASIX) 20 MG tablet Take 1 tablet (20 mg total) by mouth daily as needed for fluid or edema (2 or more pounds). 30 tablet 1   irbesartan (AVAPRO) 300 MG tablet TAKE 1 TABLET BY MOUTH EVERY DAY 90 tablet 3   metoprolol succinate (TOPROL-XL) 50 MG 24 hr tablet TAKE 1 TABLET BY MOUTH EVERY DAY 90 tablet 3   Multiple Vitamins-Minerals (MULTIVITAMIN PO) Take 1 tablet by mouth daily.      pantoprazole (PROTONIX) 40 MG tablet TAKE 1 TABLET BY MOUTH EVERY DAY 90 tablet 3   warfarin (COUMADIN) 5 MG tablet Take 1/2-1 tablet daily as directed by the coumadin clinic. 90 tablet 0   No facility-administered medications prior to visit.    No Known  Allergies  ROS Review of Systems  Constitutional:  Negative for appetite change, chills, fatigue and fever.  HENT:  Negative for congestion, dental problem, ear pain and sore throat.   Eyes:  Negative for discharge, redness and visual disturbance.  Respiratory:  Negative for cough, chest tightness, shortness of breath and wheezing.   Cardiovascular:  Negative for chest pain, palpitations and leg swelling.  Gastrointestinal:  Negative for abdominal pain, blood in stool, diarrhea, nausea and vomiting.  Genitourinary:  Negative for difficulty urinating, dysuria, flank pain, frequency, hematuria and urgency.  Musculoskeletal:  Positive for arthralgias (R shoulder pain). Negative for back pain, joint swelling, myalgias and neck stiffness.  Skin:  Negative for pallor and rash.  Neurological:  Negative for dizziness, speech difficulty, weakness and headaches.  Hematological:  Negative for adenopathy. Does not bruise/bleed easily.  Psychiatric/Behavioral:  Negative for confusion and sleep disturbance. The patient is not nervous/anxious.     PE;    11/09/2022    8:59 AM 10/22/2022    9:24 AM 08/31/2022   11:10 AM  Vitals with BMI  Height 6' 0.05" _0  _1   Weight 223 lbs 10 oz 220 lbs 219 lbs 6 oz  BMI 30.29 18.56 31.49  Systolic 702  637  Diastolic 52  65  Pulse 54  57    Gen: Alert, well appearing.  Patient is oriented to person, place, time, and situation. AFFECT: pleasant, lucid thought and speech. ENT: Ears: EACs clear, normal epithelium.  TMs with good light reflex and landmarks bilaterally.  Eyes: no injection, icteris, swelling, or exudate.  EOMI, PERRLA. Nose: no drainage or turbinate edema/swelling.  No injection or focal lesion.  Mouth: lips without lesion/swelling.  Oral mucosa pink and moist.  Dentition intact and without obvious caries or gingival swelling.  Oropharynx without erythema, exudate, or swelling.  Neck: supple/nontender.  No LAD, mass, or TM.  Carotid pulses 2+  bilaterally, without bruits. CV: RRR, no m/r/g.   LUNGS: CTA bilat, nonlabored resps, good aeration in all lung fields. ABD: soft, NT, ND, BS normal.  No hepatospenomegaly or mass.  No bruits. EXT: no clubbing, cyanosis, or edema.  Left heel without tenderness to palpation. Musculoskeletal: Right shoulder with minimal tenderness under the acromion anteriorly.  Patient has abduction pain at about 90 degrees, positive Corky Sox. Negative biceps long head signs. Minimal discomfort on external rotation. Negative drop arm.  Skin - no sores or suspicious lesions or rashes or color changes  Pertinent labs:  Lab Results  Component Value Date   TSH 2.700 04/20/2022   Lab Results  Component Value Date   WBC 5.1 07/15/2022   HGB 14.1 07/15/2022   HCT 43.1 07/15/2022   MCV 88.9 07/15/2022   PLT 182 07/15/2022   Lab Results  Component Value Date   CREATININE 1.06 07/15/2022   BUN 14 07/15/2022   NA 141 07/15/2022   K 4.2 07/15/2022   CL 110 07/15/2022   CO2 25 07/15/2022   Lab Results  Component Value Date   ALT 18 11/06/2021   AST 26 11/06/2021   ALKPHOS 43 11/06/2021   BILITOT 0.8 11/06/2021   Lab Results  Component Value Date   CHOL 164 11/06/2021   Lab Results  Component Value Date   HDL 41.60 11/06/2021   Lab Results  Component Value Date   LDLCALC 96 11/06/2021   Lab Results  Component Value Date   TRIG 133.0 11/06/2021   Lab Results  Component Value Date   CHOLHDL 4 11/06/2021   Lab Results  Component Value Date   PSA 4.87 04/29/2022   PSA 4.58 10/20/2021   PSA 4.58 10/20/2021   Lab Results  Component Value Date  HGBA1C 5.8 11/06/2021   ASSESSMENT AND PLAN:   #1 health maintenance exam: Reviewed age and gender appropriate health maintenance issues (prudent diet, regular exercise, health risks of tobacco and excessive alcohol, use of seatbelts, fire alarms in home, use of sunscreen).  Also reviewed age and gender appropriate health screening as well as  vaccine recommendations. Vaccines: All up-to-date Labs: CBC, c-Met, lipids, hemoglobin A1c Prostate ca screening: PSAs followed by Dr. Jeffie Pollock Colon ca screening: Colonoscopy 05/2021 with polyp x1.  Anticipate recall 5 years.  #2 hypertension, well controlled on irbesartan 300 mg a day and Toprol-XL 50 mg a day. Electrolytes and creatinine today.  2.  Hypertriglyceridemia, doing well on fenofibrate 145 mg a day. Lipid panel and hepatic panel today.  3.  Right shoulder pain. Suspect rotator cuff impingement. Just mildly bothersome lately.  He has some home exercises to do.  #4 left heel pain, suspect plantar fasciitis. Not bothering him the last couple days. He has home exercises.  #5 impaired fasting glucose. Hemoglobin A1c today.  An After Visit Summary was printed and given to the patient.  FOLLOW UP:  No follow-ups on file.  Signed:  Crissie Sickles, MD           11/09/2022

## 2022-11-10 ENCOUNTER — Telehealth: Payer: Self-pay

## 2022-11-10 DIAGNOSIS — E781 Pure hyperglyceridemia: Secondary | ICD-10-CM

## 2022-11-10 NOTE — Telephone Encounter (Signed)
-----   Message from Tammi Sou, MD sent at 11/10/2022  9:23 AM EST ----- Triglycerides mildly elevated at 170. I think it is okay to keep his medication at the same dosing for now.   Plan recheck lipid panel in 6 months, diagnosis hypertriglyceridemia.  If triglycerides trending upward again we will discuss possible change in medication.  All remaining labs normal.

## 2022-11-16 DIAGNOSIS — R972 Elevated prostate specific antigen [PSA]: Secondary | ICD-10-CM | POA: Diagnosis not present

## 2022-11-16 DIAGNOSIS — C61 Malignant neoplasm of prostate: Secondary | ICD-10-CM | POA: Diagnosis not present

## 2022-11-20 ENCOUNTER — Ambulatory Visit: Payer: PPO | Attending: Cardiovascular Disease

## 2022-11-20 DIAGNOSIS — Z9889 Other specified postprocedural states: Secondary | ICD-10-CM

## 2022-11-20 DIAGNOSIS — Z5181 Encounter for therapeutic drug level monitoring: Secondary | ICD-10-CM

## 2022-11-20 DIAGNOSIS — Z954 Presence of other heart-valve replacement: Secondary | ICD-10-CM | POA: Diagnosis not present

## 2022-11-20 DIAGNOSIS — Q2543 Congenital aneurysm of aorta: Secondary | ICD-10-CM

## 2022-11-20 DIAGNOSIS — I7121 Aneurysm of the ascending aorta, without rupture: Secondary | ICD-10-CM

## 2022-11-20 LAB — POCT INR: INR: 3.6 — AB (ref 2.0–3.0)

## 2022-11-20 NOTE — Patient Instructions (Signed)
HOLD SATURDAY AND SUNDAY then continue taking warfarin 1/2 tablet daily except for 1 tablet on Sunday and Thursday. Recheck INR 6 weeks. Stay consistent with greens. Coumad[facility] [phone]; Prostate procedure 12/25/22  HOLDING 5 DAYS PRIOR;

## 2022-11-30 ENCOUNTER — Ambulatory Visit: Payer: PPO

## 2022-12-01 DIAGNOSIS — C61 Malignant neoplasm of prostate: Secondary | ICD-10-CM | POA: Diagnosis not present

## 2022-12-07 ENCOUNTER — Ambulatory Visit: Payer: PPO | Attending: Cardiology | Admitting: Student

## 2022-12-07 DIAGNOSIS — Z01810 Encounter for preprocedural cardiovascular examination: Secondary | ICD-10-CM

## 2022-12-07 NOTE — Progress Notes (Signed)
Virtual Visit via Telephone Note   Because of Kenneth Ryan's co-morbid illnesses, he is at least at moderate risk for complications without adequate follow up.  This format is felt to be most appropriate for this patient at this time.  The patient did not have access to video technology/had technical difficulties with video requiring transitioning to audio format only (telephone).  All issues noted in this document were discussed and addressed.  No physical exam could be performed with this format.  Please refer to the patient's chart for his consent to telehealth for [facility].  Evaluation Performed:  Preoperative Cardiovascular Risk Assessment _____________   Date:  12/07/2022   Patient ID:  Kenneth Ryan, DOB December 21, 1946, MRN [id number] Patient Location:  Home Provider location:   Office  Primary Care Provider:  Tammi Sou, MD Primary Cardiologist:  Candee Furbish, MD  Chief Complaint / Patient Profile   Kenneth Ryan is a 76 y.o. year old male with a history of minimal non-obstructive CAD noted on cardiac catheterization in 09/2016, non-ischemic cardiomyopathy with EF of 55-60% on Echo in 04/2022, severe aortic insufficiency s/p mechanical AVR and Bentall aortic root replacement in 10/2016 on Coumadin, mitral valve repair in 10/2016, hyperlipidemia, and GERD with Barrett's esophagus who is pending radioactive seed implant for prostate cancer and presents today for telephonic preoperative cardiovascular risk assessment.  Past Medical History    Past Medical History:  Diagnosis Date   Aortic insufficiency due to bicuspid aortic valve    Mechanical valve placed 11/03/16   Aortic root aneurysm (Kenneth Ryan)    Repaired 11/03/16.   Basal cell carcinoma    nose   BRBPR (bright red blood per rectum) 2022   05/2021 colonoscopy->int hemorrhoids likely source   Chronic hoarseness    secondary to hx of laryngeal surgery   Gallstones    GERD (gastroesophageal reflux  disease)    Barrett's esoph on EGD 06/06/21   Headache    History of tobacco abuse    Hypertriglyceridemia    IFG (impaired fasting glucose) 2018   A1c 5.4-5.6. 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   Lesion of vocal cord    Mitral regurgitation    severe by TEE 08/2016:  MV repair done 11/03/16   Mitral valve prolapse    MV repair 10/2016   Nonischemic cardiomyopathy (Kenneth Ryan) 2018   dilated LV, systolic dysfunction: see echo results in Surgical hx section, 01/2017 and 05/2017.   Painless rectal bleeding 05/2021   EGD w/some gastritis, Colonoscopy with int/ext hemorrhoids, 1 polyp   Prostate cancer (Kenneth Ryan)    07/2022 bx+.  PET neg.   S/P Bentall aortic root replacement with bileaflet mechanical prosthetic valve conduit 11/03/2016   Size 27/29 mm On-X bileaflet mechanical valve and synthetic root conduit (coumadin and ASA '81mg'$  as per cardiology rec's) with reimplantation of left main and right coronary arteries.  Per CV surg, as of 02/16/17, his INR goal is 1.5-2.5.   s/p Bentall procedure for aortic root aneurysm ([place]) 11/03/2016   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   Complex valvuloplasty including artificial Gore-tex neochord placement x6 and 28 mm Sorin Memo 3D ring annuloplasty   Past Surgical History:  Procedure Laterality Date   BENTALL PROCEDURE N/A 11/03/2016  Procedure: BENTALL PROCEDURE AORTIC ROOT REPLACEMENT + mechanical AV;  Surgeon: Rexene Alberts, MD;  Location: [place];  Service: Open Heart Surgery;  Laterality: N/A;   BIOPSY  06/05/2021   Procedure: BIOPSY;  Surgeon: Thornton Park, MD;  [facility];  Service: Gastroenterology;;   CARDIAC CATHETERIZATION N/A 09/29/2016   Clean coronaries, EF 60-65%.  Procedure: Right/Left Heart Cath and  Coronary Angiography;  Surgeon: Jettie Booze, MD;  Location: [place] CV [facility];  Service: Cardiovascular;  Laterality: N/A;   CHOLECYSTECTOMY  1980   COLONOSCOPY  2003 and 2013   Both normal.   COLONOSCOPY WITH PROPOFOL N/A 06/05/2021   diverticulosis descending and sigmoid, int+ext nonbleeding hemorr, 1 sessile seratted polyp w/out cytologic dysplasia. Procedure: COLONOSCOPY WITH PROPOFOL;  Surgeon: Thornton Park, MD;  [facility];  Service: Gastroenterology;  Laterality: N/A;   DIRECT LARYNGOSCOPY Left 10/17/2015   Procedure: MICRO DIRECT LARYNGOSCOPY WITH FROZEN SECTION;  Surgeon: Jodi Marble, MD;  Location: [place];  Service: ENT;  Laterality: Left;   ESOPHAGOGASTRODUODENOSCOPY (EGD) WITH PROPOFOL N/A 06/05/2021   Barrett's esoph.  Gastritis (h pylori neg), multiple benign gastric polyps. Procedure: ESOPHAGOGASTRODUODENOSCOPY (EGD) WITH PROPOFOL;  Surgeon: Thornton Park, MD;  [facility];  Service: Gastroenterology;  Laterality: N/A;   MITRAL VALVE REPAIR N/A 11/03/2016   Procedure: MITRAL VALVE REPAIR (MVR);  Surgeon: Rexene Alberts, MD;  Location: [place];  Service: Open Heart Surgery;  Laterality: N/A;   PERIPHERAL VASCULAR CATHETERIZATION N/A 09/29/2016   Procedure: Abdominal Aortogram;  Surgeon: Jettie Booze, MD;  Location: [place] [facility];  Service: Cardiovascular;  Laterality: N/A;   PERIPHERAL VASCULAR CATHETERIZATION N/A 09/29/2016   Procedure: Thoracic Aortogram;  Surgeon: Jettie Booze, MD;  Location: [place] [facility];  Service: Cardiovascular;  Laterality: N/A;   POLYPECTOMY  06/05/2021   Procedure: POLYPECTOMY;  Surgeon: Thornton Park, MD;  [facility];  Service: Gastroenterology;;   TEE WITHOUT CARDIOVERSION N/A 09/17/2016   Procedure: TRANSESOPHAGEAL ECHOCARDIOGRAM (TEE);  Surgeon: Jerline Pain, MD;  Location: [place];  Service: Cardiovascular;  Laterality: N/A;   TEE WITHOUT CARDIOVERSION N/A  11/03/2016   Procedure: TRANSESOPHAGEAL ECHOCARDIOGRAM (TEE);  Surgeon: Rexene Alberts, MD;  Location: [place];  Service: Open Heart Surgery;  Laterality: N/A;   TONSILECTOMY/ADENOIDECTOMY WITH MYRINGOTOMY  1960   TRANSESOPHAGEAL ECHOCARDIOGRAM  09/17/2016   Severe MR with ruptured chordae.  Mod/severe AR w/functional bicuspid AV.  EF 45%.   TRANSTHORACIC ECHOCARDIOGRAM  07/2015   Bicuspid aortic valve + mod mitral regurg   TRANSTHORACIC ECHOCARDIOGRAM  02/01/2017   (Post aortic root/valve surgery): EF 35%, diffuse hypokinesis, LV dilatation, grade I DD. Cardiologist added ARB and increased his BB dose.  Lasix prn added 06/2017 due to some LE edema.   TRANSTHORACIC ECHOCARDIOGRAM  05/2017; 11/28/20   05/2017 LVEF 45-50%.  Grd II DD.  Mechanical AV good, MV repair good. 11/2020 EF 14-43%, no diastolic dysfxn, mech AV good, MV repair good.   TRANSTHORACIC ECHOCARDIOGRAM     04/2022 normal pump, aortic and mitral valve replacements doing well   Vocal cord biopsy  09/2015   No atypia or inflammation.  Some fibrous changes that are possibly from hx of radiation therapy.    Allergies  No Known Allergies  History of Present Illness    Kenneth Ryan is a 76 y.o. male who presents via audio/video conferencing for a telehealth visit today.  Patient was last seen in our office on 04/20/2022 by Dr. [name].  At  that time, he reported some mild dyspnea with heavy exertion, dizziness, and fatigue. Orthostatics were negative. Repeat Echo was ordered which showed LVEF of 55-60% with no regional wall motion abnormalities and stable aortic and mitral valves. He is now pending procedure as outlined above. Since his last visit, he has been doing well from a cardiac standpoint. He has some mild shortness of breath with more strenuous activities but this is not new and is stable. He denies any shortness of breath at rest or with routine activities. No chest pain, orthopnea, or PND. He reports very rare episodes of  dizziness of but no palpitations or syncope. He is able to complete >4.0 METS of activity without any chest pain or shortness of breath.  Home Medications    Prior to Admission medications   Medication Sig Start Date End Date Taking? Authorizing Provider  acetaminophen (TYLENOL) 500 MG tablet Take 500-1,000 mg by mouth every 6 (six) hours as needed for mild pain (depends on pain level if takes 500 mg or 1000 mg).    [provider]  aspirin EC 81 MG EC tablet Take 1 tablet (81 mg total) by mouth daily. 11/10/16   Nani Skillern, PA-C  fenofibrate (TRICOR) 145 MG tablet TAKE 1 TABLET BY MOUTH DAILY 11/09/22   [name], [name], MD  furosemide (LASIX) 20 MG tablet Take 1 tablet (20 mg total) by mouth daily as needed for fluid or edema (2 or more pounds). 09/20/18   [name], [name], MD  irbesartan (AVAPRO) 300 MG tablet TAKE 1 TABLET BY MOUTH EVERY DAY 05/06/22   Jerline Pain, MD  metoprolol succinate (TOPROL-XL) 50 MG 24 hr tablet TAKE 1 TABLET BY MOUTH EVERY DAY 06/18/22   Jerline Pain, MD  Multiple Vitamins-Minerals (MULTIVITAMIN PO) Take 1 tablet by mouth daily.     [provider]  pantoprazole (PROTONIX) 40 MG tablet TAKE 1 TABLET BY MOUTH EVERY DAY 05/12/22   Jerline Pain, MD  warfarin (COUMADIN) 5 MG tablet Take 1/2-1 tablet daily as directed by the coumadin clinic. 12/19/21   Jerline Pain, MD    Physical Exam    Vital Signs:  Dimas Alexandria does not have vital signs available for review today.  Given telephonic nature of communication, physical exam is limited. Alert and oriented x3. No acute distress. Normal affect. Speech and respirations are unlabored.  Accessory Clinical Findings    None  Assessment & Plan    Preoperative Cardiovascular Risk Assessment: Patient has an upcoming radioactive seed implant for prostate cancer planned. He is stable from a cardiac standpoint as described above. He is able to complete >4.0 METS without any anginal  symptoms. Per Revised Cardiac Risk Index [place] Criteria), considered low risk given history of NICM (although this does not take into account her history of aortic and mitral valve surgeries as well as Bentall procedure).  Based on ACC/AHA guidelines, patient would be at acceptable risk for the planned procedure without further cardiovascular testing. Per Pharmacy and office protocol, OK to hold Coumadin for 5 days prior to procedure. He will NOT need a Lovenox bridge. Also OK to hold Aspirin for 5 days. Please restart both as soon as safely possible afterwards. I will route this recommendation to the requesting party via Epic fax function.   Time:   Today, I have spent 5 minutes with the patient with telehealth technology discussing medical history, symptoms, and management plan.     Darreld Mclean, PA-C  12/07/2022,  2:27 PM

## 2022-12-23 ENCOUNTER — Encounter (HOSPITAL_BASED_OUTPATIENT_CLINIC_OR_DEPARTMENT_OTHER): Payer: Self-pay | Admitting: Urology

## 2022-12-23 NOTE — Progress Notes (Signed)
Spoke w/ via phone for pre-op interview--- pt Lab needs dos---- [employer] results------ current EKG in epic/ chart COVID test -----patient states asymptomatic no test needed Arrive at ------- 0530 on 12-25-2022 NPO after MN NO Solid Food.  Clear liquids from MN until--- 0430 Med rec completed Medications to take morning of surgery ----- protonix, toprol Diabetic medication ----- n/a Patient instructed no nail polish to be worn day of surgery Patient instructed to bring photo id and insurance card day of surgery Patient aware to have Driver (ride ) / caregiver    for 24 hours after surgery -- daughter, Kenneth Ryan Patient Special Instructions ----- will do one fleet enema prior to surgery Pre-Op special Istructions -----  pt has telephone cardiac office visit clearance by Kenneth Rives PA on 12-07-2022 in epic/ chart. Patient verbalized understanding of instructions that were given at this phone interview. Patient denies shortness of breath, chest pain, fever, cough at this phone interview.   Anesthesia Review:  HTN;  11/ 2017  s/p aortic root aneurysm repair/ replacement w/ aortic valve and mitral valve repair;  NICM last ef 55-60%;  hx larnyx cancer (vocal cord tumor)  s/p resection tumor and completed radiation 2002 (per pt no recurrence) , residual hoareness Pt denies cardiac s&s, no peripheral swelling, but sob w/ more strenuous activity  PCP:  Kenneth Ryan (lov 11-06-2021 epic) Cardiologist :  Kenneth Ryan  (lov 05-01--2023) Chest x-ray :  >2 yrs EKG :  04-20-2022 Echo :  05-07-2022 Stress test: no Cardiac Cath :  09-29-2016 epic Activity level:  normal activities no sob  Sleep Study/ CPAP : no  Blood Thinner/ Instructions /Last Dose:  Coumadin ASA / Instructions/ Last Dose : ASA '81mg'$  Per pt was given instructions from cardiologist may stop 5 days prior to surgery, stated last dose for both 12-20-2022

## 2022-12-24 ENCOUNTER — Telehealth: Payer: Self-pay | Admitting: *Deleted

## 2022-12-24 NOTE — Telephone Encounter (Signed)
CALLED PATIENT TO REMIND OF PROCEDURE FOR 12-25-22, SPOKE WITH PATIENT AND HE IS AWARE OF THIS PROCEDURE

## 2022-12-25 ENCOUNTER — Ambulatory Visit (HOSPITAL_COMMUNITY): Payer: PPO

## 2022-12-25 ENCOUNTER — Encounter (HOSPITAL_BASED_OUTPATIENT_CLINIC_OR_DEPARTMENT_OTHER)
Admission: RE | Disposition: A | Discharge status: Home / Self Care | Payer: Self-pay | Source: Home / Self Care | Attending: Urology

## 2022-12-25 ENCOUNTER — Ambulatory Visit (HOSPITAL_BASED_OUTPATIENT_CLINIC_OR_DEPARTMENT_OTHER): Payer: PPO

## 2022-12-25 ENCOUNTER — Other Ambulatory Visit: Payer: Self-pay

## 2022-12-25 ENCOUNTER — Ambulatory Visit (HOSPITAL_BASED_OUTPATIENT_CLINIC_OR_DEPARTMENT_OTHER)
Admission: RE | Admit: 2022-12-25 | Discharge: 2022-12-25 | Disposition: A | Discharge status: Home / Self Care | Payer: PPO | Source: Home / Self Care | Attending: Urology | Admitting: Urology

## 2022-12-25 ENCOUNTER — Encounter (HOSPITAL_BASED_OUTPATIENT_CLINIC_OR_DEPARTMENT_OTHER): Payer: Self-pay | Admitting: Urology

## 2022-12-25 DIAGNOSIS — Z923 Personal history of irradiation: Secondary | ICD-10-CM | POA: Diagnosis not present

## 2022-12-25 DIAGNOSIS — Z8249 Family history of ischemic heart disease and other diseases of the circulatory system: Secondary | ICD-10-CM | POA: Diagnosis not present

## 2022-12-25 DIAGNOSIS — C61 Malignant neoplasm of prostate: Secondary | ICD-10-CM | POA: Insufficient documentation

## 2022-12-25 DIAGNOSIS — Z191 Hormone sensitive malignancy status: Secondary | ICD-10-CM | POA: Diagnosis not present

## 2022-12-25 DIAGNOSIS — R791 Abnormal coagulation profile: Secondary | ICD-10-CM | POA: Diagnosis not present

## 2022-12-25 DIAGNOSIS — E782 Mixed hyperlipidemia: Secondary | ICD-10-CM | POA: Diagnosis present

## 2022-12-25 DIAGNOSIS — I2510 Atherosclerotic heart disease of native coronary artery without angina pectoris: Secondary | ICD-10-CM | POA: Diagnosis present

## 2022-12-25 DIAGNOSIS — E669 Obesity, unspecified: Secondary | ICD-10-CM | POA: Insufficient documentation

## 2022-12-25 DIAGNOSIS — Z833 Family history of diabetes mellitus: Secondary | ICD-10-CM | POA: Diagnosis not present

## 2022-12-25 DIAGNOSIS — Z7982 Long term (current) use of aspirin: Secondary | ICD-10-CM | POA: Diagnosis not present

## 2022-12-25 DIAGNOSIS — I10 Essential (primary) hypertension: Secondary | ICD-10-CM

## 2022-12-25 DIAGNOSIS — K219 Gastro-esophageal reflux disease without esophagitis: Secondary | ICD-10-CM | POA: Diagnosis present

## 2022-12-25 DIAGNOSIS — I7121 Aneurysm of the ascending aorta, without rupture: Secondary | ICD-10-CM | POA: Diagnosis not present

## 2022-12-25 DIAGNOSIS — I420 Dilated cardiomyopathy: Secondary | ICD-10-CM | POA: Diagnosis not present

## 2022-12-25 DIAGNOSIS — Z87891 Personal history of nicotine dependence: Secondary | ICD-10-CM | POA: Diagnosis not present

## 2022-12-25 DIAGNOSIS — I739 Peripheral vascular disease, unspecified: Secondary | ICD-10-CM | POA: Diagnosis present

## 2022-12-25 DIAGNOSIS — I110 Hypertensive heart disease with heart failure: Secondary | ICD-10-CM | POA: Diagnosis not present

## 2022-12-25 DIAGNOSIS — I748 Embolism and thrombosis of other arteries: Secondary | ICD-10-CM | POA: Diagnosis not present

## 2022-12-25 DIAGNOSIS — I428 Other cardiomyopathies: Secondary | ICD-10-CM | POA: Diagnosis present

## 2022-12-25 DIAGNOSIS — Z952 Presence of prosthetic heart valve: Secondary | ICD-10-CM | POA: Diagnosis not present

## 2022-12-25 DIAGNOSIS — Z7901 Long term (current) use of anticoagulants: Secondary | ICD-10-CM | POA: Insufficient documentation

## 2022-12-25 DIAGNOSIS — R7303 Prediabetes: Secondary | ICD-10-CM | POA: Diagnosis present

## 2022-12-25 DIAGNOSIS — Z6830 Body mass index (BMI) 30.0-30.9, adult: Secondary | ICD-10-CM | POA: Insufficient documentation

## 2022-12-25 DIAGNOSIS — Z9889 Other specified postprocedural states: Secondary | ICD-10-CM | POA: Diagnosis not present

## 2022-12-25 DIAGNOSIS — Z79899 Other long term (current) drug therapy: Secondary | ICD-10-CM | POA: Diagnosis not present

## 2022-12-25 DIAGNOSIS — M79609 Pain in unspecified limb: Secondary | ICD-10-CM | POA: Diagnosis not present

## 2022-12-25 DIAGNOSIS — K2270 Barrett's esophagus without dysplasia: Secondary | ICD-10-CM | POA: Diagnosis present

## 2022-12-25 DIAGNOSIS — R310 Gross hematuria: Secondary | ICD-10-CM | POA: Diagnosis not present

## 2022-12-25 DIAGNOSIS — Z01818 Encounter for other preprocedural examination: Secondary | ICD-10-CM

## 2022-12-25 DIAGNOSIS — I998 Other disorder of circulatory system: Secondary | ICD-10-CM | POA: Diagnosis present

## 2022-12-25 DIAGNOSIS — I509 Heart failure, unspecified: Secondary | ICD-10-CM | POA: Diagnosis not present

## 2022-12-25 DIAGNOSIS — Z803 Family history of malignant neoplasm of breast: Secondary | ICD-10-CM | POA: Diagnosis not present

## 2022-12-25 DIAGNOSIS — Z8521 Personal history of malignant neoplasm of larynx: Secondary | ICD-10-CM | POA: Diagnosis not present

## 2022-12-25 DIAGNOSIS — I742 Embolism and thrombosis of arteries of the upper extremities: Secondary | ICD-10-CM | POA: Diagnosis present

## 2022-12-25 DIAGNOSIS — Z954 Presence of other heart-valve replacement: Secondary | ICD-10-CM | POA: Diagnosis not present

## 2022-12-25 DIAGNOSIS — I75011 Atheroembolism of right upper extremity: Secondary | ICD-10-CM | POA: Diagnosis not present

## 2022-12-25 DIAGNOSIS — Z9049 Acquired absence of other specified parts of digestive tract: Secondary | ICD-10-CM | POA: Diagnosis not present

## 2022-12-25 DIAGNOSIS — Z85828 Personal history of other malignant neoplasm of skin: Secondary | ICD-10-CM | POA: Diagnosis not present

## 2022-12-25 HISTORY — PX: RADIOACTIVE SEED IMPLANT: SHX5150

## 2022-12-25 HISTORY — DX: Personal history of other malignant neoplasm of skin: Z85.828

## 2022-12-25 HISTORY — PX: SPACE OAR INSTILLATION: SHX6769

## 2022-12-25 HISTORY — DX: Presence of spectacles and contact lenses: Z97.3

## 2022-12-25 HISTORY — DX: Prediabetes: R73.03

## 2022-12-25 HISTORY — DX: Shortness of breath: R06.02

## 2022-12-25 HISTORY — DX: Mixed hyperlipidemia: E78.2

## 2022-12-25 LAB — POCT I-STAT, CHEM 8
BUN: 16 mg/dL (ref 8–23)
Calcium, Ion: 1.09 mmol/L — ABNORMAL LOW (ref 1.15–1.40)
Chloride: 108 mmol/L (ref 98–111)
Creatinine, Ser: 1.1 mg/dL (ref 0.61–1.24)
Glucose, Bld: 112 mg/dL — ABNORMAL HIGH (ref 70–99)
HCT: 44 % (ref 39.0–52.0)
Hemoglobin: 15 g/dL (ref 13.0–17.0)
Potassium: 4.2 mmol/L (ref 3.5–5.1)
Sodium: 143 mmol/L (ref 135–145)
TCO2: 24 mmol/L (ref 22–32)

## 2022-12-25 SURGERY — INSERTION, RADIATION SOURCE, PROSTATE
Anesthesia: General | Site: Prostate

## 2022-12-25 MED ORDER — SUGAMMADEX SODIUM 200 MG/2ML IV SOLN
INTRAVENOUS | Status: DC | PRN
  Administered 2022-12-25: 200 mg via INTRAVENOUS
  Administered 2022-12-25: 100 mg via INTRAVENOUS

## 2022-12-25 MED ORDER — LACTATED RINGERS IV SOLN: INTRAVENOUS | Status: DC

## 2022-12-25 MED ORDER — SODIUM CHLORIDE (PF) 0.9 % IJ SOLN
INTRAMUSCULAR | Status: DC | PRN
  Administered 2022-12-25: 10 mL

## 2022-12-25 MED ORDER — SODIUM CHLORIDE 0.9 % IR SOLN
Status: DC | PRN
  Administered 2022-12-25: 1000 mL

## 2022-12-25 MED ORDER — LIDOCAINE HCL (PF) 2 % IJ SOLN
INTRAMUSCULAR | Status: AC
  Filled 2022-12-25: qty 5

## 2022-12-25 MED ORDER — PHENYLEPHRINE 80 MCG/ML (10ML) SYRINGE FOR IV PUSH (FOR BLOOD PRESSURE SUPPORT)
PREFILLED_SYRINGE | INTRAVENOUS | Status: AC
  Filled 2022-12-25: qty 10

## 2022-12-25 MED ORDER — GLYCOPYRROLATE PF 0.2 MG/ML IJ SOSY
PREFILLED_SYRINGE | INTRAMUSCULAR | Status: DC | PRN
  Administered 2022-12-25: .2 mg via INTRAVENOUS

## 2022-12-25 MED ORDER — PROPOFOL 10 MG/ML IV BOLUS
INTRAVENOUS | Status: DC | PRN
  Administered 2022-12-25: 30 mg via INTRAVENOUS
  Administered 2022-12-25: 150 mg via INTRAVENOUS

## 2022-12-25 MED ORDER — MIDAZOLAM HCL 2 MG/2ML IJ SOLN
INTRAMUSCULAR | Status: AC
  Filled 2022-12-25: qty 2

## 2022-12-25 MED ORDER — STERILE WATER FOR IRRIGATION IR SOLN
Status: DC | PRN
  Administered 2022-12-25: 500 mL

## 2022-12-25 MED ORDER — HYDROCODONE-ACETAMINOPHEN 5-325 MG PO TABS: 2.0000 | ORAL_TABLET | Freq: Four times a day (QID) | ORAL | 0 refills | Status: DC | PRN

## 2022-12-25 MED ORDER — CIPROFLOXACIN IN D5W 400 MG/200ML IV SOLN
400.0000 mg | INTRAVENOUS | Status: AC
  Administered 2022-12-25: 400 mg via INTRAVENOUS

## 2022-12-25 MED ORDER — IOHEXOL 300 MG/ML  SOLN
INTRAMUSCULAR | Status: DC | PRN
  Administered 2022-12-25: 7 mL via URETHRAL

## 2022-12-25 MED ORDER — GLYCOPYRROLATE PF 0.2 MG/ML IJ SOSY
PREFILLED_SYRINGE | INTRAMUSCULAR | Status: AC
  Filled 2022-12-25: qty 1

## 2022-12-25 MED ORDER — ONDANSETRON HCL 4 MG/2ML IJ SOLN
INTRAMUSCULAR | Status: AC
  Filled 2022-12-25: qty 2

## 2022-12-25 MED ORDER — CIPROFLOXACIN IN D5W 400 MG/200ML IV SOLN
INTRAVENOUS | Status: AC
  Filled 2022-12-25: qty 200

## 2022-12-25 MED ORDER — ONDANSETRON HCL 4 MG/2ML IJ SOLN
INTRAMUSCULAR | Status: DC | PRN
  Administered 2022-12-25: 4 mg via INTRAVENOUS

## 2022-12-25 MED ORDER — ROCURONIUM BROMIDE 10 MG/ML (PF) SYRINGE
PREFILLED_SYRINGE | INTRAVENOUS | Status: AC
  Filled 2022-12-25: qty 10

## 2022-12-25 MED ORDER — PROPOFOL 10 MG/ML IV BOLUS
INTRAVENOUS | Status: AC
  Filled 2022-12-25: qty 20

## 2022-12-25 MED ORDER — DEXAMETHASONE SODIUM PHOSPHATE 10 MG/ML IJ SOLN
INTRAMUSCULAR | Status: AC
  Filled 2022-12-25: qty 1

## 2022-12-25 MED ORDER — PHENYLEPHRINE 80 MCG/ML (10ML) SYRINGE FOR IV PUSH (FOR BLOOD PRESSURE SUPPORT)
PREFILLED_SYRINGE | INTRAVENOUS | Status: DC | PRN
  Administered 2022-12-25: 160 ug via INTRAVENOUS
  Administered 2022-12-25 (×3): 80 ug via INTRAVENOUS

## 2022-12-25 MED ORDER — FLEET ENEMA 7-19 GM/118ML RE ENEM: 1.0000 | ENEMA | Freq: Once | RECTAL | Status: DC

## 2022-12-25 MED ORDER — LIDOCAINE 2% (20 MG/ML) 5 ML SYRINGE
INTRAMUSCULAR | Status: DC | PRN
  Administered 2022-12-25: 100 mg via INTRAVENOUS

## 2022-12-25 MED ORDER — FENTANYL CITRATE (PF) 100 MCG/2ML IJ SOLN
INTRAMUSCULAR | Status: AC
  Filled 2022-12-25: qty 2

## 2022-12-25 MED ORDER — MIDAZOLAM HCL 5 MG/5ML IJ SOLN
INTRAMUSCULAR | Status: DC | PRN
  Administered 2022-12-25: 2 mg via INTRAVENOUS

## 2022-12-25 MED ORDER — DEXAMETHASONE SODIUM PHOSPHATE 10 MG/ML IJ SOLN
INTRAMUSCULAR | Status: DC | PRN
  Administered 2022-12-25: 5 mg via INTRAVENOUS

## 2022-12-25 MED ORDER — FENTANYL CITRATE (PF) 100 MCG/2ML IJ SOLN
INTRAMUSCULAR | Status: DC | PRN
  Administered 2022-12-25: 50 ug via INTRAVENOUS

## 2022-12-25 MED ORDER — SODIUM CHLORIDE 0.9% FLUSH: 3.0000 mL | Freq: Two times a day (BID) | INTRAVENOUS | Status: DC

## 2022-12-25 MED ORDER — PHENYLEPHRINE HCL-NACL 20-0.9 MG/250ML-% IV SOLN
INTRAVENOUS | Status: DC | PRN
  Administered 2022-12-25: 40 ug/min via INTRAVENOUS

## 2022-12-25 MED ORDER — ROCURONIUM BROMIDE 10 MG/ML (PF) SYRINGE
PREFILLED_SYRINGE | INTRAVENOUS | Status: DC | PRN
  Administered 2022-12-25: 100 mg via INTRAVENOUS

## 2022-12-25 SURGICAL SUPPLY — 46 items
BAG DRN RND TRDRP ANRFLXCHMBR (UROLOGICAL SUPPLIES) ×1
BAG URINE DRAIN 2000ML AR STRL (UROLOGICAL SUPPLIES) ×1 IMPLANT
BLADE CLIPPER SENSICLIP SURGIC (BLADE) ×1 IMPLANT
CATH FOLEY 2WAY SLVR  5CC 16FR (CATHETERS) ×1
CATH FOLEY 2WAY SLVR 5CC 16FR (CATHETERS) ×1 IMPLANT
CATH ROBINSON RED A/P 16FR (CATHETERS) IMPLANT
CATH ROBINSON RED A/P 20FR (CATHETERS) ×1 IMPLANT
CLOTH BEACON ORANGE TIMEOUT ST (SAFETY) ×1 IMPLANT
CNTNR URN SCR LID CUP LEK RST (MISCELLANEOUS) ×1 IMPLANT
CONT SPEC 4OZ STRL OR WHT (MISCELLANEOUS)
COVER BACK TABLE 60X90IN (DRAPES) ×1 IMPLANT
COVER MAYO STAND STRL (DRAPES) ×1 IMPLANT
DRSG TEGADERM 4X4.75 (GAUZE/BANDAGES/DRESSINGS) ×1 IMPLANT
DRSG TEGADERM 8X12 (GAUZE/BANDAGES/DRESSINGS) ×1 IMPLANT
GAUZE SPONGE 4X4 12PLY STRL (GAUZE/BANDAGES/DRESSINGS) IMPLANT
GLOVE BIO SURGEON STRL SZ 6.5 (GLOVE) IMPLANT
GLOVE BIO SURGEON STRL SZ7.5 (GLOVE) IMPLANT
GLOVE BIO SURGEON STRL SZ8 (GLOVE) IMPLANT
GLOVE BIOGEL PI IND STRL 6.5 (GLOVE) ×1 IMPLANT
GLOVE SURG ORTHO 8.5 STRL (GLOVE) ×1 IMPLANT
GLOVE SURG SS PI 8.0 STRL IVOR (GLOVE) ×1 IMPLANT
GOWN STRL REUS W/TWL LRG LVL3 (GOWN DISPOSABLE) ×1 IMPLANT
GRID BRACH TEMP 18GA 2.8X3X.75 (MISCELLANEOUS) ×1 IMPLANT
HOLDER FOLEY CATH W/STRAP (MISCELLANEOUS) ×1 IMPLANT
I-125 Implant Seed IMPLANT
IMPL SPACEOAR VUE SYSTEM (Spacer) ×1 IMPLANT
IMPLANT SPACEOAR VUE SYSTEM (Spacer) ×1 IMPLANT
IV NS 1000ML (IV SOLUTION) ×1
IV NS 1000ML BAXH (IV SOLUTION) ×1 IMPLANT
KIT TURNOVER CYSTO (KITS) ×1 IMPLANT
NDL BRACHY 18G 5PK (NEEDLE) ×4 IMPLANT
NDL BRACHY 18G SINGLE (NEEDLE) IMPLANT
NDL PK MORGANSTERN STABILIZ (NEEDLE) ×1 IMPLANT
NEEDLE BRACHY 18G 5PK (NEEDLE) IMPLANT
NEEDLE BRACHY 18G SINGLE (NEEDLE) ×20 IMPLANT
NEEDLE PK MORGANSTERN STABILIZ (NEEDLE) ×1 IMPLANT
PACK CYSTO (CUSTOM PROCEDURE TRAY) ×1 IMPLANT
SHEATH ULTRASOUND LF (SHEATH) IMPLANT
SHEATH ULTRASOUND LTX NONSTRL (SHEATH) IMPLANT
SUT BONE WAX W31G (SUTURE) IMPLANT
SYR 10ML LL (SYRINGE) ×1 IMPLANT
SYR CONTROL 10ML LL (SYRINGE) ×1 IMPLANT
TOWEL OR 17X26 10 PK STRL BLUE (TOWEL DISPOSABLE) ×1 IMPLANT
TRAY DSU PREP LF (CUSTOM PROCEDURE TRAY) IMPLANT
UNDERPAD 30X36 HEAVY ABSORB (UNDERPADS AND DIAPERS) ×2 IMPLANT
WATER STERILE IRR 500ML POUR (IV SOLUTION) ×1 IMPLANT

## 2022-12-25 NOTE — Progress Notes (Signed)
Radiation Oncology         [phone] ________________________________  Name: YOSEF KROGH MRN: [id number]  Date: 12/25/2022  DOB: 1946/05/28       Prostate Seed Implant  CC:[name], [name], MD  No ref. provider found  DIAGNOSIS:  77 y.o. gentleman with Stage T1c adenocarcinoma of the prostate with Gleason score of 3+4, and PSA of 4.87.   Oncology History  Malignant neoplasm of prostate ([place])  07/31/2022 Cancer Staging   Staging form: Prostate, AJCC 8th Edition - Clinical stage from 07/31/2022: Stage IIB (cT1c, cN0, cM0, PSA: 4.9, Grade Group: 2) - Signed by Freeman Caldron, PA-C on 08/31/2022 Histopathologic type: Adenocarcinoma, NOS Stage prefix: Initial diagnosis Prostate specific antigen (PSA) range: Less than 10 Gleason primary pattern: 3 Gleason secondary pattern: 4 Gleason score: 7 Histologic grading system: 5 grade system Number of biopsy cores examined: 18 Number of biopsy cores positive: 15 Location of positive needle core biopsies: Both sides   08/31/2022 Initial Diagnosis   Malignant neoplasm of prostate ([place])       ICD-10-CM   1. Pre-op testing  Z01.818 CBG per Guidelines for Diabetes Management for Patients Undergoing Surgery ([facility], [facility], and [facility] only)    CBG per protocol    I-Stat, Chem 8 on day of surgery per protocol    CBG per Guidelines for Diabetes Management for Patients Undergoing Surgery ([facility], [facility], and [facility] only)    CBG per protocol    I-Stat, Chem 8 on day of surgery per protocol      PROCEDURE: Insertion of radioactive I-125 seeds into the prostate gland.  RADIATION DOSE: 145 Gy, definitive therapy.  TECHNIQUE: LINDWOOD MOGEL was brought to the operating room with the urologist. He was placed in the dorsolithotomy position. He was catheterized and a rectal tube was inserted. The perineum was shaved, prepped and draped. The ultrasound probe was then introduced by me into the rectum to see the prostate gland.  TREATMENT DEVICE: I attached the  needle grid to the ultrasound probe stand and anchor needles were placed.  3D PLANNING: The prostate was imaged in 3D using a sagittal sweep of the prostate probe. These images were transferred to the planning computer. There, the prostate, urethra and rectum were defined on each axial reconstructed image. Then, the software created an optimized 3D plan and a few seed positions were adjusted. The quality of the plan was reviewed using [facility] information for the target and the following two organs at risk:  Urethra and Rectum.  Then the accepted plan was printed and handed off to the radiation therapist.  Under my supervision, the custom loading of the seeds and spacers was carried out using the quick loader.  These pre-loaded needles were then placed into the needle holder.Marland Kitchen  PROSTATE VOLUME STUDY:  Using transrectal ultrasound the volume of the prostate was verified to be 35.9 cc.  SPECIAL TREATMENT PROCEDURE/SUPERVISION AND HANDLING: The pre-loaded needles were then delivered by the urologist under sagittal guidance. A total of 17 needles were used to deposit 55 seeds in the prostate gland. The individual seed activity was 0.506 mCi.  SpaceOAR:  Yes  COMPLEX SIMULATION: At the end of the procedure, an anterior radiograph of the pelvis was obtained to document seed positioning and count. Cystoscopy was performed by the urologist to check the urethra and bladder.  MICRODOSIMETRY: At the end of the procedure, the patient was emitting 0.063 mR/hr at 1 meter. Accordingly, he was considered safe for hospital discharge.  PLAN:  The patient will return to the radiation oncology clinic for post implant CT dosimetry in three weeks.   ________________________________  Sheral Apley [name], M.D.

## 2022-12-25 NOTE — Interval H&P Note (Signed)
History and Physical Interval Note:  12/25/2022 7:26 AM  Kenneth Ryan  has presented today for surgery, with the diagnosis of PROSTATE CANCER.  The various methods of treatment have been discussed with the patient and family. After consideration of risks, benefits and other options for treatment, the patient has consented to  Procedure(s): RADIOACTIVE SEED IMPLANT/BRACHYTHERAPY IMPLANT (N/A) SPACE OAR INSTILLATION (N/A) as a surgical intervention.  The patient's history has been reviewed, patient examined, no change in status, stable for surgery.  I have reviewed the patient's chart and labs.  Questions were answered to the patient's satisfaction.     Kenneth Ryan

## 2022-12-25 NOTE — Anesthesia Preprocedure Evaluation (Signed)
Anesthesia Evaluation  Patient identified by MRN, date of birth, ID band Patient awake    Reviewed: Allergy & Precautions, NPO status , Patient's Chart, lab work & pertinent test results  Airway Mallampati: II  TM Distance: >3 FB Neck ROM: Full    Dental no notable dental hx. (+) Teeth Intact, Dental Advisory Given   Pulmonary former smoker   Pulmonary exam normal breath sounds clear to auscultation       Cardiovascular hypertension, Pt. on medications and Pt. on home beta blockers Normal cardiovascular exam Rhythm:Regular Rate:Normal  Non ischemic cardiomyopathy   Neuro/Psych    GI/Hepatic Neg liver ROS,GERD  ,,  Endo/Other    Renal/GU negative Renal ROS     Musculoskeletal   Abdominal  (+) + obese  Peds  Hematology   Anesthesia Other Findings Hx of laryngeal cA s/p RADS  Reproductive/Obstetrics                             Anesthesia Physical Anesthesia Plan  ASA: 3  Anesthesia Plan: General   Post-op Pain Management: Minimal or no pain anticipated   Induction: Intravenous  PONV Risk Score and Plan: 2 and Treatment may vary due to age or medical condition, Ondansetron and Midazolam  Airway Management Planned: Oral ETT  Additional Equipment: None  Intra-op Plan:   Post-operative Plan: Extubation in OR  Informed Consent: I have reviewed the patients History and Physical, chart, labs and discussed the procedure including the risks, benefits and alternatives for the proposed anesthesia with the patient or authorized representative who has indicated his/her understanding and acceptance.     Dental advisory given  Plan Discussed with: CRNA and Anesthesiologist  Anesthesia Plan Comments:         Anesthesia Quick Evaluation

## 2022-12-25 NOTE — H&P (Signed)
Pt presents today for pre-operative history and physical exam in anticipation of radioactive seed and space oar placement by Dr. Jeffie Pollock on 12/25/22. He is doing well and is without complaint.   Pt denies F/C, HA, CP, SOB, N/V, diarrhea/constipation, back pain, flank pain, hematuria, and dysuria.    HX:     CC/HPI: I have prostate cancer.     11/16/22: Kenneth Ryan returns today in f/u for his history of prostate cancer noted below. His PSA is down to 4.46 prior to this visit. He has no weight loss or bone pain. 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 His IPSS is 3. He is scheduled for a seed implant on 12/25/21.   08/09/22: Kenneth Ryan returns today in f/u from his recent MR fusion biopsy. He was found to have a 85m prostate with multiple cores involved with GG1 and GG2 cancer. 9 cores had GG1 in 1 - 90% and 3 cores had GG2 with one in each of the 2 ROI's and the RML core. He had a total of 18 cores. A staging PET scan was negative.   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   Shim: 2.   IPSS: 4.   [facility] nomogram: 32% OCD, 68% ECE, 10% LNI and 14% SVI. 74% 561yrnd 61 1063yrS, 96% 15y39yrS.   He is on warfarin for his history of a mechanical AVR.    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   MV repair, AV replacement mechanical and aortic root repair 11/03/2016.     NON-GU PSH: Cholecystectomy (open) Heart Surgery (Unspecified) Surgical Pathology, Gross And Microscopic Examination For Prostate Needle - 07/31/2022     GU PMH: Elevated PSA - 11/16/2022, - 07/31/2022, - 07/15/2022, He has a positive ExoDx and a  positive MRI. I discussed options for management and the risks of a biopsy including bleeding, infection and voiding difficulty. HE would like to proceed with the MR fusion biopsy . He will need to be cleared by Dr. SkaiMarlou Porchcome off of the warfarin. , - 06/22/2022, His PSA is up a bit more but only about a point in total over 4 years and the f/t ratio is favorable. I discussed options and will start by getting the ExoDx test which is a type of urine based "liquid biospy". He will otherwise return in 6 months. , - 05/06/2022, His PSA is up slighlty but he had COVID 6 weeks or so prior to the blood draw. I will repeat the PSA in 6 months. , - 10/27/2021, His PSA is down further. I will have him return in a year with a PSA. , - 2021, His PSA is down slighty but in his general range over the past 2 years. I will have him return in 6 months with a repeat PSA for an exam. , - 2021 (Stable), His PSA is back up in the upper level of his range. His exam remains benign. I will just have him return in 6 months with a repeat PSA. , - 2020 (Worsening), - 2020 (Improving), His PSA has continued to decline  and is now down to normal. I think the prior elevation was secondary to his surgical event. He just needs annual PSA screening with his PCP and f/u with me prn. , - 2018, Up to 4.17 from 2.59 last year. , - 2017 Prostate Cancer, His PSA is down slightly and he has no concerning symptoms. He is scheduled for a seed implant on 12/25/21. - 11/16/2022, He has a 6m prostate with a T1c N0 M0 GG2 favorable intermediate risk prostate cancer but he has high volume disease. He has mild LUTS and severe ED. I discussed AS, RALP, EXRT, Seeds, Cryo and HIFU with him. He is not interested in surveillance and was initially interested in surgical therapy but after our discussion would like to be considered for a seed implant. I will send him to Dr. MTammi Klippelbut feel he is likely to be an appropriate candidate for that option. I discussed the  risks, benefits and rational for Brachytherapy. I reviewed the details of the procedure and the postoperative recovery expectations. I reviewed the risks of bleeding, infection, urinary retention with need for catheter drainage, injury to adjacent structures, severe prolonged LUTS possibly requiring medical or surgical therapy, incontinence, strictures, erectile dysfunction, bowel and bladder radiation injury, thrombotic events, anesthetic complications, urethra-rectal fistulae with need for additional surgery for correction and possible colostomy, mortality and delay radiation effects with a risk of secondary malignancies, treatment failure with need for salvage therapy. I discussed the risks, benefits and rational for EXRT. I reviewed the need for placement of fiducial markers and the time commitment for therapy. I reviewed the risks including fatigue, skin changes, local alopecia, rectal irritation with bleeding or change in bowel habits, bladder irritation with LUT's, future bleeding and bladder contraction, incontinence, erectile dysfunction, strictures, urethra-rectal fistulae with need for additional surgery for correction and possible colostomy, and there risk of secondary malignancies or treatment failure requiring salvage therapy. I discussed the risks, benefits and rational for radical prostatectomy and reviewed the surgical approaches. I reviewed the risks of bleeding, infection, injury to the bowel, bladder, ureters, rectum and other pelvic structures, bowel obstruction, wound complications and hernias, barotrauma, need for conversion to open from robotic, nerve compression injury, urine leak, anastomotic stricture, incontinence, erectile dysfunction, thrombotic events, anesthetic complications and mortality. I discussed the preoperative preparation and the expected hospital stay and postoperative recovery as well as the risks of recurrence and need for salvage therapy. I briefly discussed ADT and the  associated side effects as he may need to be considered for ST - ADT if he elects radiation therapy particularly if he is not a candidate for seed monotherapy. , - 08/19/2022 Abnormal radiologic findings on diagnostic imaging of other urinary organs - 06/22/2022 BPH w/LUTS - 05/06/2022, He has mild LUTS and a benign exam. , - 10/27/2021, His exam remains benign. He has mild LUTS with some intermittinency and nocturia. , - 2021 (Stable), - 2020 Nocturia - 05/06/2022, - 10/27/2021, - 2021, He has nocturia x 1 with minimal bother. , - 2020 BPH w/o LUTS - 2017      PMH Notes: laryngeal cancer   NON-GU PMH: Localized edema - 07/15/2022 Cardiac murmur, unspecified GERD    FAMILY HISTORY: Diabetes - Mother heart failure - Father Heart problem - Father multiple myeloma - Mother   SOCIAL HISTORY: Marital Status: Widowed Preferred Language: English; Race: White Current Smoking Status: Patient does not smoke anymore.  Does not use smokeless tobacco. Light Drinker.  Does not use drugs. Drinks 2 caffeinated drinks  per day. Has not had a blood transfusion. Patient's occupation is/was retired.     Notes: ETOH one beer every couple of months    REVIEW OF SYSTEMS:    GU Review Male:   Patient denies frequent urination, hard to postpone urination, burning/ pain with urination, get up at night to urinate, leakage of urine, stream starts and stops, trouble starting your stream, have to strain to urinate , erection problems, and penile pain.  Gastrointestinal (Upper):   Patient denies nausea, vomiting, and indigestion/ heartburn.  Gastrointestinal (Lower):   Patient denies diarrhea and constipation.  Constitutional:   Patient denies fever, night sweats, weight loss, and fatigue.  Skin:   Patient denies skin rash/ lesion and itching.  Eyes:   Patient denies blurred vision and double vision.  Ears/ Nose/ Throat:   Patient denies sore throat and sinus problems.  Hematologic/Lymphatic:   Patient denies swollen  glands and easy bruising.  Cardiovascular:   Patient denies leg swelling and chest pains.  Respiratory:   Patient denies cough and shortness of breath.  Endocrine:   Patient denies excessive thirst.  Musculoskeletal:   Patient denies back pain and joint pain.  Neurological:   Patient denies headaches and dizziness.  Psychologic:   Patient denies depression and anxiety.   VITAL SIGNS:      12/01/2022 12:59 PM  BP 107/65 mmHg  Pulse 69 /min   MULTI-SYSTEM PHYSICAL EXAMINATION:    Constitutional: Well-nourished. No physical deformities. Normally developed. Good grooming.  Neck: Neck symmetrical, not swollen. Normal tracheal position.  Respiratory: Normal breath sounds. No labored breathing, no use of accessory muscles.   Cardiovascular: Regular rate and rhythm. No murmur, no gallop. mechanical aortic valve click noted   Lymphatic: No enlargement of neck, axillae, groin.  Skin: No paleness, no jaundice, no cyanosis. No lesion, no ulcer, no rash.  Neurologic / Psychiatric: Oriented to time, oriented to place, oriented to person. No depression, no anxiety, no agitation.  Gastrointestinal: No mass, no tenderness, no rigidity, obese abdomen.   Eyes: Normal conjunctivae. Normal eyelids.  Ears, Nose, Mouth, and Throat: Left ear no scars, no lesions, no masses. Right ear no scars, no lesions, no masses. Nose no scars, no lesions, no masses. Normal hearing. Normal lips.  Musculoskeletal: Normal gait and station of head and neck.     Complexity of Data:  Records Review:   Previous Patient Records  Urine Test Review:   Urinalysis   12/01/22  Urinalysis  Urine Appearance Clear   Urine Specimen Patient Cath   Urine Color Yellow   Urine Glucose Neg   Urine Bilirubin Neg   Urine Ketones Neg   Urine Specific Gravity 1.020   Urine Blood Neg   Urine pH 5.5   Urine Protein Neg   Urine Urobilinogen 0.2   Urine Nitrites Neg   Urine Leukocyte Esterase Neg    PROCEDURES:          Urinalysis -  81003 Dipstick Dipstick Cont'd  Specimen: Patient Cath Bilirubin: Neg  Color: Yellow Ketones: Neg  Appearance: Clear Blood: Neg  Specific Gravity: 1.020 Protein: Neg  pH: 5.5 Urobilinogen: 0.2  Glucose: Neg Nitrites: Neg    Leukocyte Esterase: Neg    ASSESSMENT:      ICD-10 Details  1 GU:   Prostate Cancer - C61    PLAN:           Schedule Return Visit/Planned Activity: Keep Scheduled Appointment - Schedule Surgery  Document Letter(s):  Created for Patient: Clinical Summary         Notes:   There are no changes in the patients history or physical exam since last evaluation by Dr. Jeffie Pollock. Pt is scheduled to undergo seed and space oar placement on 12/25/22.   All pt's questions were answered to the best of my ability.          Next Appointment:      Next Appointment: 12/25/2022 07:30 AM    Appointment Type: Surgery     Location: [facility] [phone]    Provider: [name], M.D.    Reason for Visit: [place]

## 2022-12-25 NOTE — Op Note (Signed)
PATIENT:  Kenneth Ryan  PRE-OPERATIVE DIAGNOSIS:  Adenocarcinoma of the prostate  POST-OPERATIVE DIAGNOSIS:  Same  PROCEDURE:  Procedure(s): 1. I-125 radioactive seed implantation 2. SpaceOAR implantation. 3.  Cystoscopy  SURGEON:  Surgeon(s): [name] MD  Radiation oncologist: Dr. Tyler Pita  ANESTHESIA:  General  EBL:  12m  DRAINS: 16 French Foley catheter  INDICATION: Kenneth TRULSONis a 77y.o. with Stage T1c, Gleason 7(3+4) prostate cancer who has elected brachytherapy for treatment.  Description of procedure: After informed consent the patient was brought to the major OR, placed on the table and administered general anesthesia. He was then moved to the modified lithotomy position with his perineum perpendicular to the floor. His perineum and genitalia were then sterilely prepped. An official timeout was then performed. A 16 French Foley catheter was then placed in the bladder and filled with dilute contrast, a rectal tube was placed in the rectum and the transrectal ultrasound probe was placed in the rectum and affixed to the stand. He was then sterilely draped.  The sterile grid was installed.   Anchor needles were then placed.   Real time ultrasonography was used along with the seed planning software spot-pro version 3.1-00. This was used to develop the seed plan including the number of needles as well as number of seeds required for complete and adequate coverage. Real-time ultrasonography was then used along with the previously developed plan  to implant a total of 55 seeds using 17 needles for a target dose of 145 Gy. This proceeded without difficulty or complication.  The anchor needles and guide were removed and the SpaceOAR needle was passed under UKoreaguidance into the fat stripe posterior to the prostate with the tip in the midline at mid prostate. A puff of NS confirmed appropriate positioning and the SpaceOAR Vue polymer was then injected over 12 seconds into  the space with excellent distribution.  The needle was removed after a short delay.    A Foley catheter was then removed as well as the transrectal ultrasound probe and rectal probe. Flexible cystoscopy was then performed using the 17 French flexible scope which revealed a normal urethra throughout its length down to the sphincter which appeared intact. The prostatic urethra was 2cm with lateral lobe hyperplasia and minimal obstruction. . The bladder was then entered and fully and systematically inspected.  The ureteral orifices were noted to be of normal configuration and position. The mucosa revealed no evidence of tumors. There were also no stones identified within the bladder.  No seeds or spacers were seen and/or removed from the bladder.  The cystoscope was then removed.  The drapes were removed.  The perineum was cleaned and dressed.  He was taken out of the lithotomy position and was awakened and taken to recovery room in stable and satisfactory condition. He tolerated procedure well and there were no intraoperative complications.

## 2022-12-25 NOTE — Anesthesia Postprocedure Evaluation (Signed)
Anesthesia Post Note  Patient: Kenneth Ryan  Procedure(s) Performed: RADIOACTIVE SEED IMPLANT/BRACHYTHERAPY IMPLANT (Prostate) SPACE OAR INSTILLATION (Prostate)     Patient location during evaluation: PACU Anesthesia Type: General Level of consciousness: awake and alert Pain management: pain level controlled Vital Signs Assessment: post-procedure vital signs reviewed and stable Respiratory status: spontaneous breathing, nonlabored ventilation and respiratory function stable Cardiovascular status: blood pressure returned to baseline and stable Postop Assessment: no apparent nausea or vomiting Anesthetic complications: no   No notable events documented.  Last Vitals:  Vitals:   12/25/22 0930 12/25/22 0950  BP: 133/73 116/70  Pulse: 63 64  Resp: 12 16  Temp: (!) 36.4 C (!) 36.4 C  SpO2: 96% 96%    Last Pain:  Vitals:   12/25/22 0950  TempSrc:   PainSc: [place]

## 2022-12-25 NOTE — Discharge Instructions (Addendum)
You may resume warfarin in 48 hours if you are not bleeding.    Post Anesthesia Home Care Instructions  Activity: Get plenty of rest for the remainder of the day. A responsible individual must stay with you for 24 hours following the procedure.  For the next 24 hours, DO NOT: -Drive a car -Paediatric nurse -Drink alcoholic beverages -Take any medication unless instructed by your physician -Make any legal decisions or sign important papers.  Meals: Start with liquid foods such as gelatin or soup. Progress to regular foods as tolerated. Avoid greasy, spicy, heavy foods. If nausea and/or vomiting occur, drink only clear liquids until the nausea and/or vomiting subsides. Call your physician if vomiting continues.  Special Instructions/Symptoms: Your throat may feel dry or sore from the anesthesia or the breathing tube placed in your throat during surgery. If this causes discomfort, gargle with warm salt water. The discomfort should disappear within 24 hours.

## 2022-12-25 NOTE — Anesthesia Procedure Notes (Signed)
Procedure Name: Intubation Date/Time: 12/25/2022 7:46 AM  Performed by: Rogers Blocker, CRNAPre-anesthesia Checklist: Patient identified, Emergency Drugs available, Suction available and Patient being monitored Patient Re-evaluated:Patient Re-evaluated prior to induction Oxygen Delivery Method: Circle System Utilized Preoxygenation: Pre-oxygenation with 100% oxygen Induction Type: IV induction Ventilation: Mask ventilation without difficulty Laryngoscope Size: Mac and 4 Grade View: Grade I Tube type: Oral Tube size: 7.0 mm Number of attempts: 1 Airway Equipment and Method: Oral airway and Bougie stylet Placement Confirmation: ETT inserted through vocal cords under direct vision, positive ETCO2 and breath sounds checked- equal and bilateral Secured at: 22 cm Tube secured with: Tape Dental Injury: Teeth and Oropharynx as per pre-operative assessment  Comments: Grade 1 view, but unable to pass ETT. Bougie used without problems.

## 2022-12-25 NOTE — Transfer of Care (Signed)
Immediate Anesthesia Transfer of Care Note  Patient: Kenneth Ryan  Procedure(s) Performed: RADIOACTIVE SEED IMPLANT/BRACHYTHERAPY IMPLANT (Prostate) SPACE OAR INSTILLATION (Prostate)  Patient Location: PACU  Anesthesia Type:General  Level of Consciousness: awake, alert , oriented, and patient cooperative  Airway & Oxygen Therapy: Patient Spontanous Breathing  Post-op Assessment: Report given to RN and Post -op Vital signs reviewed and stable  Post vital signs: Reviewed and stable  Last Vitals:  Vitals Value Taken Time  BP 114/62 12/25/22 0900  Temp 36.4 C 12/25/22 0855  Pulse 66 12/25/22 0900  Resp 9 12/25/22 0900  SpO2 96 % 12/25/22 0900  Vitals shown include unvalidated device data.  Last Pain:  Vitals:   12/25/22 0547  TempSrc: Oral         Complications: No notable events documented.

## 2022-12-27 ENCOUNTER — Other Ambulatory Visit: Payer: Self-pay

## 2022-12-27 ENCOUNTER — Encounter (HOSPITAL_COMMUNITY): Payer: Self-pay

## 2022-12-27 ENCOUNTER — Emergency Department (HOSPITAL_COMMUNITY): Payer: PPO

## 2022-12-27 ENCOUNTER — Encounter (HOSPITAL_COMMUNITY)
Admission: EM | Disposition: A | Discharge status: Home / Self Care | Payer: Self-pay | Source: Home / Self Care | Attending: Student

## 2022-12-27 ENCOUNTER — Inpatient Hospital Stay (HOSPITAL_COMMUNITY): Payer: PPO | Admitting: Anesthesiology

## 2022-12-27 ENCOUNTER — Inpatient Hospital Stay (HOSPITAL_COMMUNITY)
Admission: EM | Admit: 2022-12-27 | Discharge: 2022-12-30 | DRG: 253 | Disposition: A | Discharge status: Home / Self Care | Payer: PPO | Attending: Student | Admitting: Student

## 2022-12-27 DIAGNOSIS — I420 Dilated cardiomyopathy: Secondary | ICD-10-CM | POA: Diagnosis not present

## 2022-12-27 DIAGNOSIS — Z85828 Personal history of other malignant neoplasm of skin: Secondary | ICD-10-CM

## 2022-12-27 DIAGNOSIS — I739 Peripheral vascular disease, unspecified: Secondary | ICD-10-CM

## 2022-12-27 DIAGNOSIS — Z9889 Other specified postprocedural states: Secondary | ICD-10-CM

## 2022-12-27 DIAGNOSIS — Z8521 Personal history of malignant neoplasm of larynx: Secondary | ICD-10-CM | POA: Diagnosis not present

## 2022-12-27 DIAGNOSIS — Z9049 Acquired absence of other specified parts of digestive tract: Secondary | ICD-10-CM

## 2022-12-27 DIAGNOSIS — K2270 Barrett's esophagus without dysplasia: Secondary | ICD-10-CM | POA: Diagnosis present

## 2022-12-27 DIAGNOSIS — Z807 Family history of other malignant neoplasms of lymphoid, hematopoietic and related tissues: Secondary | ICD-10-CM

## 2022-12-27 DIAGNOSIS — R791 Abnormal coagulation profile: Secondary | ICD-10-CM | POA: Diagnosis not present

## 2022-12-27 DIAGNOSIS — I998 Other disorder of circulatory system: Secondary | ICD-10-CM | POA: Diagnosis present

## 2022-12-27 DIAGNOSIS — Z833 Family history of diabetes mellitus: Secondary | ICD-10-CM

## 2022-12-27 DIAGNOSIS — I10 Essential (primary) hypertension: Secondary | ICD-10-CM | POA: Diagnosis present

## 2022-12-27 DIAGNOSIS — Z952 Presence of prosthetic heart valve: Secondary | ICD-10-CM

## 2022-12-27 DIAGNOSIS — Q2543 Congenital aneurysm of aorta: Secondary | ICD-10-CM | POA: Diagnosis present

## 2022-12-27 DIAGNOSIS — I742 Embolism and thrombosis of arteries of the upper extremities: Secondary | ICD-10-CM | POA: Diagnosis present

## 2022-12-27 DIAGNOSIS — E782 Mixed hyperlipidemia: Secondary | ICD-10-CM | POA: Diagnosis present

## 2022-12-27 DIAGNOSIS — E669 Obesity, unspecified: Secondary | ICD-10-CM | POA: Diagnosis present

## 2022-12-27 DIAGNOSIS — Z6830 Body mass index (BMI) 30.0-30.9, adult: Secondary | ICD-10-CM

## 2022-12-27 DIAGNOSIS — Z7901 Long term (current) use of anticoagulants: Secondary | ICD-10-CM

## 2022-12-27 DIAGNOSIS — I2510 Atherosclerotic heart disease of native coronary artery without angina pectoris: Secondary | ICD-10-CM | POA: Diagnosis present

## 2022-12-27 DIAGNOSIS — Z87891 Personal history of nicotine dependence: Secondary | ICD-10-CM | POA: Diagnosis not present

## 2022-12-27 DIAGNOSIS — R200 Anesthesia of skin: Secondary | ICD-10-CM | POA: Diagnosis present

## 2022-12-27 DIAGNOSIS — K219 Gastro-esophageal reflux disease without esophagitis: Secondary | ICD-10-CM | POA: Diagnosis present

## 2022-12-27 DIAGNOSIS — I110 Hypertensive heart disease with heart failure: Secondary | ICD-10-CM

## 2022-12-27 DIAGNOSIS — Z7982 Long term (current) use of aspirin: Secondary | ICD-10-CM

## 2022-12-27 DIAGNOSIS — R310 Gross hematuria: Secondary | ICD-10-CM | POA: Diagnosis not present

## 2022-12-27 DIAGNOSIS — I428 Other cardiomyopathies: Secondary | ICD-10-CM | POA: Diagnosis present

## 2022-12-27 DIAGNOSIS — Z79899 Other long term (current) drug therapy: Secondary | ICD-10-CM

## 2022-12-27 DIAGNOSIS — I75011 Atheroembolism of right upper extremity: Secondary | ICD-10-CM | POA: Diagnosis not present

## 2022-12-27 DIAGNOSIS — C61 Malignant neoplasm of prostate: Secondary | ICD-10-CM | POA: Diagnosis present

## 2022-12-27 DIAGNOSIS — Z803 Family history of malignant neoplasm of breast: Secondary | ICD-10-CM

## 2022-12-27 DIAGNOSIS — I7121 Aneurysm of the ascending aorta, without rupture: Secondary | ICD-10-CM | POA: Diagnosis not present

## 2022-12-27 DIAGNOSIS — Z923 Personal history of irradiation: Secondary | ICD-10-CM

## 2022-12-27 DIAGNOSIS — I509 Heart failure, unspecified: Secondary | ICD-10-CM

## 2022-12-27 DIAGNOSIS — R7303 Prediabetes: Secondary | ICD-10-CM | POA: Diagnosis present

## 2022-12-27 DIAGNOSIS — Z954 Presence of other heart-valve replacement: Secondary | ICD-10-CM | POA: Diagnosis not present

## 2022-12-27 DIAGNOSIS — R202 Paresthesia of skin: Secondary | ICD-10-CM | POA: Diagnosis present

## 2022-12-27 DIAGNOSIS — M79609 Pain in unspecified limb: Secondary | ICD-10-CM

## 2022-12-27 DIAGNOSIS — Z8249 Family history of ischemic heart disease and other diseases of the circulatory system: Secondary | ICD-10-CM | POA: Diagnosis not present

## 2022-12-27 HISTORY — PX: EMBOLECTOMY: SHX44

## 2022-12-27 LAB — SURGICAL PCR SCREEN
MRSA, PCR: NEGATIVE
Staphylococcus aureus: NEGATIVE

## 2022-12-27 LAB — CBC WITH DIFFERENTIAL/PLATELET
Abs Immature Granulocytes: 0.04 10*3/uL (ref 0.00–0.07)
Basophils Absolute: 0 10*3/uL (ref 0.0–0.1)
Basophils Relative: 1 %
Eosinophils Absolute: 0.1 10*3/uL (ref 0.0–0.5)
Eosinophils Relative: 1 %
HCT: 44.4 % (ref 39.0–52.0)
Hemoglobin: 14.2 g/dL (ref 13.0–17.0)
Immature Granulocytes: 1 %
Lymphocytes Relative: 35 %
Lymphs Abs: 2.8 10*3/uL (ref 0.7–4.0)
MCH: 29.1 pg (ref 26.0–34.0)
MCHC: 32 g/dL (ref 30.0–36.0)
MCV: 91 fL (ref 80.0–100.0)
Monocytes Absolute: 0.6 10*3/uL (ref 0.1–1.0)
Monocytes Relative: 7 %
Neutro Abs: 4.5 10*3/uL (ref 1.7–7.7)
Neutrophils Relative %: 55 %
Platelets: 167 10*3/uL (ref 150–400)
RBC: 4.88 MIL/uL (ref 4.22–5.81)
RDW: 13.5 % (ref 11.5–15.5)
WBC: 8.1 10*3/uL (ref 4.0–10.5)
nRBC: 0 % (ref 0.0–0.2)

## 2022-12-27 LAB — COMPREHENSIVE METABOLIC PANEL
ALT: 23 U/L (ref 0–44)
AST: 31 U/L (ref 15–41)
Albumin: 3.8 g/dL (ref 3.5–5.0)
Alkaline Phosphatase: 38 U/L (ref 38–126)
Anion gap: 7 (ref 5–15)
BUN: 14 mg/dL (ref 8–23)
CO2: 28 mmol/L (ref 22–32)
Calcium: 8.8 mg/dL — ABNORMAL LOW (ref 8.9–10.3)
Chloride: 102 mmol/L (ref 98–111)
Creatinine, Ser: 1.12 mg/dL (ref 0.61–1.24)
GFR, Estimated: >60 mL/min (ref >60)
Glucose, Bld: 87 mg/dL (ref 70–99)
Potassium: 4 mmol/L (ref 3.5–5.1)
Sodium: 137 mmol/L (ref 135–145)
Total Bilirubin: 0.9 mg/dL (ref 0.3–1.2)
Total Protein: 6.7 g/dL (ref 6.5–8.1)

## 2022-12-27 LAB — PROTIME-INR
INR: 1.1 (ref 0.8–1.2)
Prothrombin Time: 14.3 seconds (ref 11.4–15.2)

## 2022-12-27 LAB — POCT ACTIVATED CLOTTING TIME: Activated Clotting Time: 223 seconds

## 2022-12-27 SURGERY — EMBOLECTOMY
Anesthesia: General | Laterality: Right

## 2022-12-27 MED ORDER — BISACODYL 10 MG RE SUPP: 10.0000 mg | Freq: Every day | RECTAL | Status: DC | PRN

## 2022-12-27 MED ORDER — SUCCINYLCHOLINE CHLORIDE 200 MG/10ML IV SOSY
PREFILLED_SYRINGE | INTRAVENOUS | Status: AC
  Filled 2022-12-27: qty 10

## 2022-12-27 MED ORDER — ONDANSETRON HCL 4 MG/2ML IJ SOLN
INTRAMUSCULAR | Status: DC | PRN
  Administered 2022-12-27: 4 mg via INTRAVENOUS

## 2022-12-27 MED ORDER — DEXAMETHASONE SODIUM PHOSPHATE 10 MG/ML IJ SOLN
INTRAMUSCULAR | Status: AC
  Filled 2022-12-27: qty 1

## 2022-12-27 MED ORDER — HEPARIN (PORCINE) 25000 UT/250ML-% IV SOLN
INTRAVENOUS | Status: DC | PRN
  Administered 2022-12-27: 1200 [IU]/h via INTRAVENOUS

## 2022-12-27 MED ORDER — ORAL CARE MOUTH RINSE: 15.0000 mL | Freq: Once | OROMUCOSAL | Status: AC

## 2022-12-27 MED ORDER — 0.9 % SODIUM CHLORIDE (POUR BTL) OPTIME
TOPICAL | Status: DC | PRN
  Administered 2022-12-27: 1000 mL

## 2022-12-27 MED ORDER — METOPROLOL TARTRATE 5 MG/5ML IV SOLN: 2.0000 mg | INTRAVENOUS | Status: DC | PRN

## 2022-12-27 MED ORDER — FENTANYL CITRATE (PF) 100 MCG/2ML IJ SOLN
INTRAMUSCULAR | Status: AC
  Filled 2022-12-27: qty 2

## 2022-12-27 MED ORDER — IRBESARTAN 300 MG PO TABS
300.0000 mg | ORAL_TABLET | Freq: Every day | ORAL | Status: DC
  Administered 2022-12-28 – 2022-12-30 (×3): 300 mg via ORAL
  Filled 2022-12-27 (×3): qty 1

## 2022-12-27 MED ORDER — FUROSEMIDE 20 MG PO TABS: 20.0000 mg | ORAL_TABLET | Freq: Every day | ORAL | Status: DC | PRN

## 2022-12-27 MED ORDER — HEPARIN 6000 UNIT IRRIGATION SOLUTION
Status: DC | PRN
  Administered 2022-12-27: 1

## 2022-12-27 MED ORDER — POLYETHYLENE GLYCOL 3350 17 G PO PACK: 17.0000 g | PACK | Freq: Every day | ORAL | Status: DC | PRN

## 2022-12-27 MED ORDER — PANTOPRAZOLE SODIUM 40 MG PO TBEC
40.0000 mg | DELAYED_RELEASE_TABLET | Freq: Every day | ORAL | Status: DC
  Administered 2022-12-28 – 2022-12-30 (×3): 40 mg via ORAL
  Filled 2022-12-27 (×3): qty 1

## 2022-12-27 MED ORDER — ALUM & MAG HYDROXIDE-SIMETH 200-200-20 MG/5ML PO SUSP: 15.0000 mL | ORAL | Status: DC | PRN

## 2022-12-27 MED ORDER — ROCURONIUM BROMIDE 10 MG/ML (PF) SYRINGE
PREFILLED_SYRINGE | INTRAVENOUS | Status: DC | PRN
  Administered 2022-12-27: 20 mg via INTRAVENOUS

## 2022-12-27 MED ORDER — SUCCINYLCHOLINE CHLORIDE 200 MG/10ML IV SOSY
PREFILLED_SYRINGE | INTRAVENOUS | Status: DC | PRN
  Administered 2022-12-27: 100 mg via INTRAVENOUS

## 2022-12-27 MED ORDER — PHENOL 1.4 % MT LIQD: 1.0000 | OROMUCOSAL | Status: DC | PRN

## 2022-12-27 MED ORDER — LACTATED RINGERS IV SOLN: INTRAVENOUS | Status: DC

## 2022-12-27 MED ORDER — FENTANYL CITRATE (PF) 250 MCG/5ML IJ SOLN
INTRAMUSCULAR | Status: AC
  Filled 2022-12-27: qty 5

## 2022-12-27 MED ORDER — OXYCODONE HCL 5 MG PO TABS: 5.0000 mg | ORAL_TABLET | Freq: Once | ORAL | Status: DC | PRN

## 2022-12-27 MED ORDER — CHLORHEXIDINE GLUCONATE 0.12 % MT SOLN
15.0000 mL | Freq: Once | OROMUCOSAL | Status: AC
  Administered 2022-12-27: 15 mL via OROMUCOSAL

## 2022-12-27 MED ORDER — HEPARIN SODIUM (PORCINE) 1000 UNIT/ML IJ SOLN
INTRAMUSCULAR | Status: DC | PRN
  Administered 2022-12-27: 5000 [IU] via INTRAVENOUS

## 2022-12-27 MED ORDER — POTASSIUM CHLORIDE CRYS ER 20 MEQ PO TBCR: 20.0000 meq | EXTENDED_RELEASE_TABLET | Freq: Every day | ORAL | Status: DC | PRN

## 2022-12-27 MED ORDER — HYDROCODONE-ACETAMINOPHEN 5-325 MG PO TABS: 2.0000 | ORAL_TABLET | Freq: Four times a day (QID) | ORAL | Status: DC | PRN

## 2022-12-27 MED ORDER — SUGAMMADEX SODIUM 200 MG/2ML IV SOLN
INTRAVENOUS | Status: DC | PRN
  Administered 2022-12-27 (×2): 100 mg via INTRAVENOUS

## 2022-12-27 MED ORDER — CEFAZOLIN SODIUM-DEXTROSE 2-3 GM-%(50ML) IV SOLR
INTRAVENOUS | Status: DC | PRN
  Administered 2022-12-27: 2 g via INTRAVENOUS

## 2022-12-27 MED ORDER — PROMETHAZINE HCL 25 MG/ML IJ SOLN: 6.2500 mg | INTRAMUSCULAR | Status: DC | PRN

## 2022-12-27 MED ORDER — OXYCODONE HCL 5 MG PO TABS
5.0000 mg | ORAL_TABLET | ORAL | Status: DC | PRN
  Administered 2022-12-28: 10 mg via ORAL
  Filled 2022-12-27: qty 2

## 2022-12-27 MED ORDER — PANTOPRAZOLE SODIUM 40 MG PO TBEC: 40.0000 mg | DELAYED_RELEASE_TABLET | Freq: Every day | ORAL | Status: DC

## 2022-12-27 MED ORDER — ACETAMINOPHEN 325 MG PO TABS
325.0000 mg | ORAL_TABLET | ORAL | Status: DC | PRN
  Administered 2022-12-29: 650 mg via ORAL
  Filled 2022-12-27: qty 2

## 2022-12-27 MED ORDER — ACETAMINOPHEN 10 MG/ML IV SOLN
INTRAVENOUS | Status: AC
  Filled 2022-12-27: qty 100

## 2022-12-27 MED ORDER — DEXAMETHASONE SODIUM PHOSPHATE 10 MG/ML IJ SOLN
INTRAMUSCULAR | Status: DC | PRN
  Administered 2022-12-27: 5 mg via INTRAVENOUS

## 2022-12-27 MED ORDER — SODIUM CHLORIDE 0.9 % IV SOLN: 500.0000 mL | Freq: Once | INTRAVENOUS | Status: DC | PRN

## 2022-12-27 MED ORDER — METOPROLOL SUCCINATE ER 50 MG PO TB24
50.0000 mg | ORAL_TABLET | Freq: Every day | ORAL | Status: DC
  Administered 2022-12-28 – 2022-12-30 (×3): 50 mg via ORAL
  Filled 2022-12-27 (×3): qty 1

## 2022-12-27 MED ORDER — ASPIRIN 81 MG PO TBEC
81.0000 mg | DELAYED_RELEASE_TABLET | Freq: Every day | ORAL | Status: DC
  Administered 2022-12-28 – 2022-12-30 (×3): 81 mg via ORAL
  Filled 2022-12-27 (×3): qty 1

## 2022-12-27 MED ORDER — HEPARIN (PORCINE) 25000 UT/250ML-% IV SOLN
1550.0000 [IU]/h | INTRAVENOUS | Status: DC
  Administered 2022-12-27: 1550 [IU]/h via INTRAVENOUS
  Filled 2022-12-27: qty 250

## 2022-12-27 MED ORDER — ROCURONIUM BROMIDE 10 MG/ML (PF) SYRINGE
PREFILLED_SYRINGE | INTRAVENOUS | Status: AC
  Filled 2022-12-27: qty 10

## 2022-12-27 MED ORDER — FENTANYL CITRATE (PF) 250 MCG/5ML IJ SOLN
INTRAMUSCULAR | Status: DC | PRN
  Administered 2022-12-27: 150 ug via INTRAVENOUS

## 2022-12-27 MED ORDER — OXYCODONE HCL 5 MG/5ML PO SOLN: 5.0000 mg | Freq: Once | ORAL | Status: DC | PRN

## 2022-12-27 MED ORDER — AMISULPRIDE (ANTIEMETIC) 5 MG/2ML IV SOLN: 10.0000 mg | Freq: Once | INTRAVENOUS | Status: DC | PRN

## 2022-12-27 MED ORDER — ACETAMINOPHEN 10 MG/ML IV SOLN
1000.0000 mg | Freq: Once | INTRAVENOUS | Status: DC | PRN
  Administered 2022-12-27: 1000 mg via INTRAVENOUS

## 2022-12-27 MED ORDER — HYDRALAZINE HCL 20 MG/ML IJ SOLN: 5.0000 mg | INTRAMUSCULAR | Status: DC | PRN

## 2022-12-27 MED ORDER — ONDANSETRON HCL 4 MG/2ML IJ SOLN: 4.0000 mg | Freq: Once | INTRAMUSCULAR | Status: DC | PRN

## 2022-12-27 MED ORDER — HEPARIN (PORCINE) 25000 UT/250ML-% IV SOLN
INTRAVENOUS | Status: AC
  Filled 2022-12-27: qty 250

## 2022-12-27 MED ORDER — LIDOCAINE 2% (20 MG/ML) 5 ML SYRINGE
INTRAMUSCULAR | Status: AC
  Filled 2022-12-27: qty 5

## 2022-12-27 MED ORDER — PROPOFOL 10 MG/ML IV BOLUS
INTRAVENOUS | Status: DC | PRN
  Administered 2022-12-27: 100 mg via INTRAVENOUS

## 2022-12-27 MED ORDER — MAGNESIUM SULFATE 2 GM/50ML IV SOLN: 2.0000 g | Freq: Every day | INTRAVENOUS | Status: DC | PRN

## 2022-12-27 MED ORDER — MORPHINE SULFATE (PF) 2 MG/ML IV SOLN
2.0000 mg | INTRAVENOUS | Status: DC | PRN
  Administered 2022-12-27: 2 mg via INTRAVENOUS
  Filled 2022-12-27: qty 1

## 2022-12-27 MED ORDER — ONDANSETRON HCL 4 MG/2ML IJ SOLN: 4.0000 mg | Freq: Four times a day (QID) | INTRAMUSCULAR | Status: DC | PRN

## 2022-12-27 MED ORDER — GUAIFENESIN-DM 100-10 MG/5ML PO SYRP: 15.0000 mL | ORAL_SOLUTION | ORAL | Status: DC | PRN

## 2022-12-27 MED ORDER — HEPARIN (PORCINE) 25000 UT/250ML-% IV SOLN: 12.0000 [IU]/kg/h | INTRAVENOUS | Status: DC

## 2022-12-27 MED ORDER — SODIUM CHLORIDE 0.9 % IV SOLN: INTRAVENOUS | Status: DC

## 2022-12-27 MED ORDER — ACETAMINOPHEN 500 MG PO TABS: 500.0000 mg | ORAL_TABLET | Freq: Four times a day (QID) | ORAL | Status: DC | PRN

## 2022-12-27 MED ORDER — ACETAMINOPHEN 325 MG RE SUPP: 325.0000 mg | RECTAL | Status: DC | PRN

## 2022-12-27 MED ORDER — PROPOFOL 10 MG/ML IV BOLUS
INTRAVENOUS | Status: AC
  Filled 2022-12-27: qty 20

## 2022-12-27 MED ORDER — ONDANSETRON HCL 4 MG/2ML IJ SOLN
INTRAMUSCULAR | Status: AC
  Filled 2022-12-27: qty 2

## 2022-12-27 MED ORDER — CEFAZOLIN SODIUM-DEXTROSE 2-4 GM/100ML-% IV SOLN
2.0000 g | Freq: Three times a day (TID) | INTRAVENOUS | Status: AC
  Administered 2022-12-28 (×2): 2 g via INTRAVENOUS
  Filled 2022-12-27 (×2): qty 100

## 2022-12-27 MED ORDER — CEFAZOLIN SODIUM 1 G IJ SOLR
INTRAMUSCULAR | Status: AC
  Filled 2022-12-27: qty 20

## 2022-12-27 MED ORDER — CHLORHEXIDINE GLUCONATE 0.12 % MT SOLN
OROMUCOSAL | Status: AC
  Filled 2022-12-27: qty 15

## 2022-12-27 MED ORDER — HYDROMORPHONE HCL 1 MG/ML IJ SOLN: 0.2500 mg | INTRAMUSCULAR | Status: DC | PRN

## 2022-12-27 MED ORDER — HEPARIN 6000 UNIT IRRIGATION SOLUTION
Status: AC
  Filled 2022-12-27: qty 500

## 2022-12-27 MED ORDER — FENOFIBRATE 160 MG PO TABS
160.0000 mg | ORAL_TABLET | Freq: Every day | ORAL | Status: DC
  Administered 2022-12-28 – 2022-12-30 (×3): 160 mg via ORAL
  Filled 2022-12-27 (×3): qty 1

## 2022-12-27 MED ORDER — FENTANYL CITRATE (PF) 100 MCG/2ML IJ SOLN
25.0000 ug | INTRAMUSCULAR | Status: DC | PRN
  Administered 2022-12-27 (×2): 50 ug via INTRAVENOUS

## 2022-12-27 MED ORDER — LIDOCAINE 2% (20 MG/ML) 5 ML SYRINGE
INTRAMUSCULAR | Status: DC | PRN
  Administered 2022-12-27: 60 mg via INTRAVENOUS

## 2022-12-27 MED ORDER — LABETALOL HCL 5 MG/ML IV SOLN: 10.0000 mg | INTRAVENOUS | Status: DC | PRN

## 2022-12-27 MED ORDER — DOCUSATE SODIUM 100 MG PO CAPS
100.0000 mg | ORAL_CAPSULE | Freq: Every day | ORAL | Status: DC
  Administered 2022-12-28 – 2022-12-30 (×3): 100 mg via ORAL
  Filled 2022-12-27 (×4): qty 1

## 2022-12-27 SURGICAL SUPPLY — 43 items
BAG COUNTER SPONGE SURGICOUNT (BAG) ×1 IMPLANT
BANDAGE ESMARK 6X9 LF (GAUZE/BANDAGES/DRESSINGS) IMPLANT
BNDG ELASTIC 4X5.8 VLCR STR LF (GAUZE/BANDAGES/DRESSINGS) IMPLANT
BNDG ESMARK 6X9 LF (GAUZE/BANDAGES/DRESSINGS)
CANISTER SUCT 3000ML PPV (MISCELLANEOUS) ×1 IMPLANT
CATH EMB 3FR 40CM (CATHETERS) IMPLANT
CATH EMB 3FR 80CM (CATHETERS) IMPLANT
CATH EMB 4FR 80CM (CATHETERS) IMPLANT
CATH EMB 5FR 80CM (CATHETERS) IMPLANT
CNTNR URN SCR LID CUP LEK RST (MISCELLANEOUS) IMPLANT
CONT SPEC 4OZ STRL OR WHT (MISCELLANEOUS) ×1
DRAIN CHANNEL 15F RND FF W/TCR (WOUND CARE) IMPLANT
DRAPE X-RAY CASS 24X20 (DRAPES) IMPLANT
DRSG XEROFORM 1X8 (GAUZE/BANDAGES/DRESSINGS) IMPLANT
ELECT REM PT RETURN 9FT ADLT (ELECTROSURGICAL) ×1
ELECTRODE REM PT RTRN 9FT ADLT (ELECTROSURGICAL) ×1 IMPLANT
EVACUATOR SILICONE 100CC (DRAIN) IMPLANT
GAUZE SPONGE 4X4 12PLY STRL (GAUZE/BANDAGES/DRESSINGS) IMPLANT
GLOVE BIOGEL PI IND STRL 8 (GLOVE) ×1 IMPLANT
GOWN STRL REUS W/ TWL LRG LVL3 (GOWN DISPOSABLE) ×2 IMPLANT
GOWN STRL REUS W/TWL 2XL LVL3 (GOWN DISPOSABLE) ×2 IMPLANT
GOWN STRL REUS W/TWL LRG LVL3 (GOWN DISPOSABLE) ×2
KIT BASIN OR (CUSTOM PROCEDURE TRAY) ×1 IMPLANT
KIT TURNOVER KIT B (KITS) ×1 IMPLANT
NS IRRIG 1000ML POUR BTL (IV SOLUTION) ×2 IMPLANT
PACK CV ACCESS (CUSTOM PROCEDURE TRAY) IMPLANT
PAD ARMBOARD 7.5X6 YLW CONV (MISCELLANEOUS) ×2 IMPLANT
SET COLLECT BLD 21X3/4 12 (NEEDLE) IMPLANT
STOPCOCK 4 WAY LG BORE MALE ST (IV SETS) IMPLANT
SUT MNCRL AB 4-0 PS2 18 (SUTURE) ×1 IMPLANT
SUT PROLENE 5 0 C 1 24 (SUTURE) ×1 IMPLANT
SUT PROLENE 6 0 BV (SUTURE) ×1 IMPLANT
SUT SILK 2 0 PERMA HAND 18 BK (SUTURE) IMPLANT
SUT VIC AB 2-0 CT1 27 (SUTURE)
SUT VIC AB 2-0 CT1 TAPERPNT 27 (SUTURE) ×1 IMPLANT
SUT VIC AB 3-0 SH 27 (SUTURE)
SUT VIC AB 3-0 SH 27X BRD (SUTURE) ×1 IMPLANT
SYR 3ML LL SCALE MARK (SYRINGE) ×1 IMPLANT
TOWEL GREEN STERILE (TOWEL DISPOSABLE) ×1 IMPLANT
TRAY FOLEY MTR SLVR 16FR STAT (SET/KITS/TRAYS/PACK) ×1 IMPLANT
TUBING EXTENTION W/L.L. (IV SETS) IMPLANT
UNDERPAD 30X36 HEAVY ABSORB (UNDERPADS AND DIAPERS) ×1 IMPLANT
WATER STERILE IRR 1000ML POUR (IV SOLUTION) ×1 IMPLANT

## 2022-12-27 NOTE — Progress Notes (Deleted)
ANTICOAGULATION CONSULT NOTE - Initial Consult  Pharmacy Consult for Heparin Indication:  possible arterial occlusion  No Known Allergies  Patient Measurements: Height: 6' (182.9 cm) Weight: 99.8 kg (220 lb) IBW/kg (Calculated) : 77.6 Heparin Dosing Weight: 98 kg  Vital Signs: Temp: 97.6 F (36.4 C) (01/07 1314) Temp Source: Oral (01/07 1314) BP: 130/65 (01/07 1615) Pulse Rate: 50 (01/07 1615)  Labs: Recent Labs    12/25/22 0624 12/27/22 1413  HGB 15.0 14.2  HCT 44.0 44.4  PLT  --  167  LABPROT  --  14.3  INR  --  1.1  CREATININE 1.10 1.12    Estimated Creatinine Clearance: 70.8 mL/min (by C-G formula based on SCr of 1.12 mg/dL).   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   followed by [facility]   Chronic hoarseness    secondary to hx of laryngeal surgery   GERD (gastroesophageal reflux disease)    Barrett's esoph on EGD 06/06/21   History of basal cell carcinoma    nose   History of laryngeal cancer 2002   Surgery and radiation completed same year 2002---  oncologist released pt summer 2014   (benign bx by dr [name] in 10/ 2016)   History of lower GI bleeding 05/2021   s/p   EGD w/some non bleeding gastritis, Colonoscopy with int/ext hemorrhoids, 1 polyp   Hyperlipidemia, mixed    Malignant neoplasm prostate ([place]) 07/2022   urologist-- wrenn/  radiation oncologist--- dr [name];  dx 08/ 2023,  Gleason 3+4   Nonischemic cardiomyopathy ([place]) 08/2016   followed by cardiologist-  dr [name];    per echo ef 40-45%  09/ 2017;   echo post surgery in 2017  ef 35%;  improved 2019 ;  last echo 05-07-2022  ef 55-60%   Pre-diabetes    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  Per CV surg, as of 02/16/17, his INR goal is 1.5-2.5.   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   Severe Mitral regurg;   s/p  Complex valvuloplasty including artificial Gore-tex neochord placement x6 and 28 mm Sorin Memo 3D ring annuloplasty   SOB (shortness of breath) on exertion    normal activities ok but more strenous acitivity sob which is baseline per cardiologist note   Wears glasses    Assessment: Active Problem(s): cool numb sensation throughout the right upper extremity.  - Coumadin: Patient stopped last week (off 6d) for radioactive seed implant for newly diagnosed prostate cancer. That procedure was performed 1/5 without complication and the patient restarted his Coumadin earlier today. (1/7)  PMH: Prosthetic valve on Coumadin, New prostate cancer dx, cholecystectomy, heart surgery, HTN;  11/ 2017  s/p mechanical AVR and Bentall aortic root replacement with bileaflet mechanical prosthetic valve conduit , MVR HLD,  NICM last ef 55-60%;  hx larnyx cancer (vocal cord tumor)  s/p resection tumor and completed radiation 2002 (per pt no recurrence) , residual hoareness ,GERD with Barrett's esophagus, BCC, HAs,   AC/Heme: Coumadin PTA for mechanical AVR. CBC WNL. INR 1. Has been off Coumadin for several days for radioactive  seed implant  - Start IV heparin for possible arterial occlusion  Goal of Therapy:  Heparin level 0.3-0.7 units/ml Monitor platelets by anticoagulation protocol: Yes   Plan:  F/u arterial duplex--preliminary report IV heparin 4000 unit bolus Heparin infusion at 1550 units/hr Check heparin level in 6-8 hrs. Daily HL and CBC   Franco Duley S. Alford Highland, PharmD, BCPS Clinical Staff Pharmacist [email or web address] Alford Highland, Katelind Pytel Stillinger 12/27/2022,6:00 PM

## 2022-12-27 NOTE — Anesthesia Postprocedure Evaluation (Signed)
Anesthesia Post Note  Patient: Kenneth Ryan  Procedure(s) Performed: RIGHT BRACHIAL, ULNAR, AND RADIAL ARTERY EMBOLECTOMY (Right)     Patient location during evaluation: PACU Anesthesia Type: General Level of consciousness: awake Pain management: pain level controlled Vital Signs Assessment: post-procedure vital signs reviewed and stable Respiratory status: spontaneous breathing, nonlabored ventilation and respiratory function stable Cardiovascular status: blood pressure returned to baseline and stable Postop Assessment: no apparent nausea or vomiting Anesthetic complications: no   No notable events documented.  Last Vitals:  Vitals:   12/27/22 2025 12/27/22 2030  BP: 138/78 127/67  Pulse: 66 65  Resp: 10 13  Temp: 36.4 C   SpO2: 94% 95%    Last Pain:  Vitals:   12/27/22 2025  TempSrc:   PainSc: 0-No pain                 Heberto Sturdevant P Neelam Tiggs

## 2022-12-27 NOTE — ED Triage Notes (Addendum)
Patient reports that he has had numbness and feeling cold from the right shoulder to the right fingers since 1030 today.  Patient reports that he has been off of his Coumadin x 6 days due to Radiation seed implant of the prostate. Patient states he started his Coumadin back today.

## 2022-12-27 NOTE — Anesthesia Procedure Notes (Signed)
Arterial Line Insertion Start/End1/06/2023 6:45 PM Performed by: Josephine Igo, CRNA, CRNA  Patient location: OR. Preanesthetic checklist: patient identified, IV checked, site marked, risks and benefits discussed, surgical consent, monitors and equipment checked, pre-op evaluation, timeout performed and anesthesia consent Patient sedated Left, radial was placed Catheter size: 20 G Hand hygiene performed  and maximum sterile barriers used   Attempts: 1 Procedure performed without using ultrasound guided technique. Following insertion, dressing applied and Biopatch. Post procedure assessment: normal  Patient tolerated the procedure well with no immediate complications.

## 2022-12-27 NOTE — Anesthesia Procedure Notes (Signed)
Procedure Name: Intubation Date/Time: 12/27/2022 6:42 PM  Performed by: Trinna Post., CRNAPre-anesthesia Checklist: Patient identified, Emergency Drugs available, Suction available, Patient being monitored and Timeout performed Patient Re-evaluated:Patient Re-evaluated prior to induction Oxygen Delivery Method: Circle system utilized Preoxygenation: Pre-oxygenation with 100% oxygen Induction Type: IV induction, Rapid sequence and Cricoid Pressure applied Laryngoscope Size: Mac and 4 Grade View: Grade I Tube type: Oral Tube size: 7.5 mm Number of attempts: 1 Airway Equipment and Method: Stylet Placement Confirmation: ETT inserted through vocal cords under direct vision, positive ETCO2 and breath sounds checked- equal and bilateral Secured at: 23 cm Tube secured with: Tape Dental Injury: Teeth and Oropharynx as per pre-operative assessment

## 2022-12-27 NOTE — ED Provider Notes (Signed)
[place] DEPT Provider Note   CSN: [id number] Arrival date & time: 12/27/22  1238     History  No chief complaint on file.   EARLIN SWEEDEN is a 77 y.o. male.  HPI Patient presents with his wife who assists with the history.  He presents with concern for cool numb sensation throughout the right upper extremity.  No discoordination, no weakness, no other systemic complaints.  History is notable for prosthetic valve for which she takes Coumadin.  Patient stopped that medication last week in preparation for radioactive seed implant for newly diagnosed prostate cancer.  That procedure was performed 2 days ago without complication and the patient restarted his Coumadin earlier today.    Home Medications Prior to Admission medications   Medication Sig Start Date End Date Taking? Authorizing Provider  acetaminophen (TYLENOL) 500 MG tablet Take 500-1,000 mg by mouth every 6 (six) hours as needed for mild pain (depends on pain level if takes 500 mg or 1000 mg).    [provider]  aspirin EC 81 MG EC tablet Take 1 tablet (81 mg total) by mouth daily. 11/10/16   Nani Skillern, PA-C  fenofibrate (TRICOR) 145 MG tablet TAKE 1 TABLET BY MOUTH DAILY Patient taking differently: Take 145 mg by mouth daily. TAKE 1 TABLET BY MOUTH DAILY 11/09/22   [name], [name], MD  furosemide (LASIX) 20 MG tablet Take 1 tablet (20 mg total) by mouth daily as needed for fluid or edema (2 or more pounds). Patient taking differently: Take 20 mg by mouth daily as needed for fluid or edema (2 or more pounds). 09/20/18   [name], [name], MD  HYDROcodone-acetaminophen (NORCO/VICODIN) 5-325 MG tablet Take 2 tablets by mouth every 6 (six) hours as needed for moderate pain. 12/25/22 12/25/23  [name], MD  irbesartan (AVAPRO) 300 MG tablet TAKE 1 TABLET BY MOUTH EVERY DAY Patient taking differently: Take 300 mg by mouth daily. 05/06/22   Jerline Pain, MD  metoprolol  succinate (TOPROL-XL) 50 MG 24 hr tablet TAKE 1 TABLET BY MOUTH EVERY DAY Patient taking differently: Take 50 mg by mouth daily. 06/18/22   Jerline Pain, MD  Multiple Vitamins-Minerals (MULTIVITAMIN PO) Take 1 tablet by mouth daily.     [provider]  pantoprazole (PROTONIX) 40 MG tablet TAKE 1 TABLET BY MOUTH EVERY DAY Patient taking differently: Take 40 mg by mouth daily. 05/12/22   Jerline Pain, MD  warfarin (COUMADIN) 5 MG tablet Take 1/2-1 tablet daily as directed by the coumadin clinic. Patient taking differently: Take by mouth as directed. Take 1/2-1 tablet daily as directed by the coumadin clinic. 12/19/21   Jerline Pain, MD      Allergies    Patient has no known allergies.    Review of Systems   Review of Systems  All other systems reviewed and are negative.   Physical Exam Updated Vital Signs BP 130/65   Pulse (!) 50   Temp 97.6 F (36.4 C) (Oral)   Resp 10   Ht 6' (1.829 m)   Wt 99.8 kg   SpO2 99%   BMI 29.84 kg/m  Physical Exam Vitals and nursing note reviewed.  Constitutional:      General: He is not in acute distress.    Appearance: He is well-developed.  HENT:     Head: Normocephalic and atraumatic.  Eyes:     Conjunctiva/sclera: Conjunctivae normal.  Cardiovascular:     Rate and Rhythm: Normal  rate and regular rhythm.     Comments: Click consistent with prosthetic valve. Palpable pulse in the AC fossa.  Nonpalpable pulses radial, ulnar Pulmonary:     Effort: Pulmonary effort is normal. No respiratory distress.     Breath sounds: No stridor.  Abdominal:     General: There is no distension.  Musculoskeletal:     Comments: No deformities, abnormalities  Skin:    General: Skin is dry.     Comments: Color difference left arm typical pain, right arm slightly more pale.  Neurological:     Mental Status: He is alert and oriented to person, place, and time.     ED Results / Procedures / Treatments   Labs (all labs ordered are listed,  but only abnormal results are displayed) Labs Reviewed  COMPREHENSIVE METABOLIC PANEL - Abnormal; Notable for the following components:      Result Value   Calcium 8.8 (*)    All other components within normal limits  CBC WITH DIFFERENTIAL/PLATELET  PROTIME-INR    EKG None  Radiology VAS [place] UPPER EXTREMITY ARTERIAL DUPLEX  Result Date: 12/27/2022  UPPER EXTREMITY DUPLEX STUDY Patient Name:  JAYSTEN ESSNER [facility]  Date of Exam:   12/27/2022 Medical Rec #: [id number]          Accession #:    [id number] Date of Birth: 1946-08-25          Patient Gender: M Patient Age:   29 years Exam Location:  [facility] Procedure:      VAS [place] UPPER EXTREMITY ARTERIAL DUPLEX Referring Phys: COOPER ROBBINS --------------------------------------------------------------------------------  History: Patient has a history of upper extremity pain and tingling of fingers.          On Coumadin status post mitral valve replacement, however, stopped          Coumadin for procedure for approximately 6 days, restarted today.  Comparison Study: No prior study on file Performing Technologist: Sharion Dove RVS  Examination Guidelines: A complete evaluation includes B-mode imaging, spectral Doppler, color Doppler, and power Doppler as needed of all accessible portions of each vessel. Bilateral testing is considered an integral part of a complete examination. Limited examinations for reoccurring indications may be performed as noted.  Right Doppler Findings: +--------------+----------+---------+--------+--------+ Site          PSV (cm/s)Waveform StenosisComments +--------------+----------+---------+--------+--------+ Subclavian Mid93        triphasic                 +--------------+----------+---------+--------+--------+ Axillary      72        triphasic                 +--------------+----------+---------+--------+--------+ Brachial Prox 44        triphasic                  +--------------+----------+---------+--------+--------+ Brachial Mid  35        triphasic                 +--------------+----------+---------+--------+--------+ Brachial Dist 20        triphasic                 +--------------+----------+---------+--------+--------+ Radial Prox   12                                  +--------------+----------+---------+--------+--------+ Radial Mid    10                                  +--------------+----------+---------+--------+--------+  Radial Dist   14                                  +--------------+----------+---------+--------+--------+ Ulnar Prox    0                  occluded         +--------------+----------+---------+--------+--------+ Ulnar Mid     0                                   +--------------+----------+---------+--------+--------+ Ulnar Dist    0                                   +--------------+----------+---------+--------+--------+ Palmar Arch   11                                  +--------------+----------+---------+--------+--------+ Acute thrombus noted at the brachial bifurcation at the [facility] No flow noted in ulnar artery. Severely dampened monophasic/near thumping waveform noted in the radial artery.  Summary:  Right: Obstruction noted in the Brachial bifurcation and ulnar        artery. Acute thrombus noted at the brachial bifurcation at        the [facility]. No flow noted in ulnar artery. Severely dampened        monophasic/near thumping waveform noted in the radial artery. *See table(s) above for measurements and observations.    Preliminary     Procedures Procedures    Medications Ordered in ED Medications  heparin ADULT infusion 100 units/mL (25000 units/250m) (has no administration in time range)    ED Course/ Medical Decision Making/ A&P This patient with a Hx of mechanical cardiac valve, prostate cancer presents to the ED for concern of, cool sensation throughout the right upper  extremity, this involves an extensive number of treatment options, and is a complaint that carries with it a high risk of complications and morbidity.    The differential diagnosis includes thrombosis, vascular or arterial, infection, less likely CNS given the absence of focal neurodeficits   Social Determinants of Health:  Cancer  Additional history obtained:  Additional history and/or information obtained from wife at bedside, notable for HPI   After the initial evaluation, orders, including: Lab abs ultrasound from triage were initiated.   Patient placed on Cardiac and Pulse-Oximetry Monitors. The patient was maintained on a cardiac monitor.  The cardiac monitored showed an rhythm of 70 sinus normal The patient was also maintained on pulse oximetry. The readings were typically 100% room air normal   On repeat evaluation of the patient stayed the same  Lab Tests:  I personally interpreted labs.  The pertinent results include: Subtherapeutic INR level consistent with patient's only resuming his Coumadin today  Imaging Studies ordered:  I independently visualized and interpreted imaging which showed arterial occlusion, distal brachial at the bifurcation I agree with the radiologist interpretation  Consultations Obtained:  I requested consultation with the vascular surgery, hospitalist,  and discussed lab and imaging findings as well as pertinent plan - they recommend: Will start heparin drip, require transfer to our affiliated center, likely surgical repair of his acute arterial occlusion  Dispostion / Final MDM:  After consideration of the diagnostic results  and the patient's response to treatment, this adult male presents with new numbness, cold pale right arm in the context of recent planned hiatus from a Coumadin due to need for cancer therapy.  Patient's physical exam is initial concern for occlusion and this is demonstrated on ultrasound.  Case discussed with vascular  surgery, the patient will require transfer, initiation of heparin drip.  Case discussed with Dr. Virl Cagey from vascular surgery, our hospitalist colleagues for admission, and Dr. Tamera Punt who accept the patient for ED to ED transfer.   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 Critical care was necessary to treat or prevent imminent or life-threatening deterioration. Critical care was time spent personally by me on the following activities: development of treatment plan with patient and/or surrogate as well as nursing, discussions with consultants, evaluation of patient's response to treatment, examination of patient, obtaining history from patient or surrogate, ordering and performing treatments and interventions, ordering and review of laboratory studies, ordering and review of radiographic studies, pulse oximetry and re-evaluation of patient's condition.    Carmin Muskrat, MD 12/27/22 1739

## 2022-12-27 NOTE — Progress Notes (Addendum)
ANTICOAGULATION CONSULT NOTE - Initial Consult  Pharmacy Consult for Heparin Indication: thrombus at the right brachial artery bifurcation   No Known Allergies  Patient Measurements: Height: 6' 0.01" (182.9 cm) Weight: 99.8 kg (220 lb) IBW/kg (Calculated) : 77.62 Heparin Dosing Weight: 98 kg  Vital Signs: Temp: 97.6 F (36.4 C) (01/07 1955) Temp Source: Oral (01/07 1314) BP: 142/75 (01/07 2010) Pulse Rate: 69 (01/07 2010)  Labs: Recent Labs    12/25/22 0624 12/27/22 1413  HGB 15.0 14.2  HCT 44.0 44.4  PLT  --  167  LABPROT  --  14.3  INR  --  1.1  CREATININE 1.10 1.12     Estimated Creatinine Clearance: 70.8 mL/min (by C-G formula based on SCr of 1.12 mg/dL).   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   followed by [facility]   Chronic hoarseness    secondary to hx of laryngeal surgery   GERD (gastroesophageal reflux disease)    Barrett's esoph on EGD 06/06/21   History of basal cell carcinoma    nose   History of laryngeal cancer 2002   Surgery and radiation completed same year 2002---  oncologist released pt summer 2014   (benign bx by dr [name] in 10/ 2016)   History of lower GI bleeding 05/2021   s/p   EGD w/some non bleeding gastritis, Colonoscopy with int/ext hemorrhoids, 1 polyp   Hyperlipidemia, mixed    Malignant neoplasm prostate ([place]) 07/2022   urologist-- wrenn/  radiation oncologist--- dr [name];  dx 08/ 2023,  Gleason 3+4   Nonischemic cardiomyopathy ([place]) 08/2016   followed by cardiologist-  dr [name];    per echo ef 40-45%  09/ 2017;   echo post surgery in 2017  ef 35%;  improved 2019 ;  last echo 05-07-2022  ef 55-60%   Pre-diabetes    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  Per CV surg, as of 02/16/17, his INR goal is 1.5-2.5.   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   Severe Mitral regurg;   s/p  Complex valvuloplasty including artificial Gore-tex neochord placement x6 and 28 mm Sorin Memo 3D ring annuloplasty   SOB (shortness of breath) on exertion    normal activities ok but more strenous acitivity sob which is baseline per cardiologist note   Wears glasses    Assessment: Pt is s/p embolectomy of brachial artery thrombus. Heparin was started in the OR. We will increase the rate due to his weight. We will f/u about resuming coumadin.   Goal of Therapy:  Heparin level 0.3-0.7 units/ml Monitor platelets by anticoagulation protocol: Yes   Plan:  Increase heparin infusion to 1550 units/hr Check heparin level in AM Daily HL and CBC   Onnie Boer, PharmD, BCIDP, AAHIVP, CPP Infectious Disease Pharmacist 12/27/2022 8:28 PM

## 2022-12-27 NOTE — Transfer of Care (Signed)
Immediate Anesthesia Transfer of Care Note  Patient: Kenneth Ryan  Procedure(s) Performed: RIGHT BRACHIAL, ULNAR, AND RADIAL ARTERY EMBOLECTOMY (Right)  Patient Location: PACU  Anesthesia Type:General  Level of Consciousness: awake, alert , and oriented  Airway & Oxygen Therapy: Patient Spontanous Breathing  Post-op Assessment: Report given to RN and Post -op Vital signs reviewed and stable  Post vital signs: Reviewed and stable  Last Vitals:  Vitals Value Taken Time  BP 141/77 12/27/22 2015  Temp 36.4 C 12/27/22 1955  Pulse 66 12/27/22 2022  Resp 12 12/27/22 2022  SpO2 97 % 12/27/22 2022  Vitals shown include unvalidated device data.  Last Pain:  Vitals:   12/27/22 2010  TempSrc:   PainSc: 5       Patients Stated Pain Goal: 2 (09/60/45 4098)  Complications: No notable events documented.

## 2022-12-27 NOTE — ED Notes (Signed)
[facility] is called

## 2022-12-27 NOTE — H&P (Signed)
History and Physical    Kenneth Ryan NUU:[id number] DOB: Apr 24, 1946 DOA: 12/27/2022  PCP: Tammi Sou, MD Patient coming from: home  I have personally briefly reviewed patient's old medical records in [place]  Chief Complaint: right arm cold  HPI: Kenneth Ryan is a 77 y.o. male with medical history significant of  minimal non-obstructive CAD noted on cardiac catheterization in 09/2016, non-ischemic cardiomyopathy with EF of 55-60% on Echo in 04/2022, severe aortic insufficiency s/p mechanical AVR and Bentall aortic root replacement in 10/2016 on Coumadin, mitral valve repair in 10/2016, hyperlipidemia, and GERD with Barrett's esophagus , recently diagnosed with prostate cancer , he has been off of his Coumadin x 6 days due to Radiation seed implant of the prostate. Patient states he started his Coumadin back today. Presents to the ED due to right arm being cold and numb. He otherwise no acute complaints, no fever, no chest pain, no sob, no edema.  ED Course:   Data reviewed: Blood pressure 130/65, pulse (!) 50, temperature 97.6 F (36.4 C), temperature source Oral, resp. rate 10, height 6' (1.829 m), weight 99.8 kg, SpO2 99 %.  Cbc/cmp unremarkable,  INR 1.1  RUE arterial duplex :  Right: Obstruction noted in the Brachial bifurcation and ulnar         artery. Acute thrombus noted at the brachial bifurcation at         the [facility]. No flow noted in ulnar artery. Severely dampened         monophasic/near thumping waveform noted in the radial artery.   EDP discussed with vascular surgery who recommended to start on heparin drip and transfer to [place] for urgent embolectomy , hospitalist called to admit the patient.   Review of Systems: As per HPI otherwise all other systems reviewed and are negative.   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    followed by [facility]   Chronic hoarseness    secondary to hx of laryngeal surgery   GERD (gastroesophageal reflux disease)    Barrett's esoph on EGD 06/06/21   History of basal cell carcinoma    nose   History of laryngeal cancer 2002   Surgery and radiation completed same year 2002---  oncologist released pt summer 2014   (benign bx by dr [name] in 10/ 2016)   History of lower GI bleeding 05/2021   s/p   EGD w/some non bleeding gastritis, Colonoscopy with int/ext hemorrhoids, 1 polyp   Hyperlipidemia, mixed    Malignant neoplasm prostate ([place]) 07/2022   urologist-- wrenn/  radiation oncologist--- dr [name];  dx 08/ 2023,  Gleason 3+4   Nonischemic cardiomyopathy ([place]) 08/2016   followed by cardiologist-  dr [name];    per echo ef 40-45%  09/ 2017;   echo post surgery in 2017  ef 35%;  improved 2019 ;  last echo 05-07-2022  ef 55-60%   Pre-diabetes    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  Per CV surg, as of 02/16/17, his INR goal is 1.5-2.5.   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  Severe Mitral regurg;   s/p  Complex valvuloplasty including artificial Gore-tex neochord placement x6 and 28 mm Sorin Memo 3D ring annuloplasty   SOB (shortness of breath) on exertion    normal activities ok but more strenous acitivity sob which is baseline per cardiologist note   Wears glasses     Past Surgical History:  Procedure Laterality Date   BENTALL PROCEDURE N/A 11/03/2016   Procedure: BENTALL PROCEDURE AORTIC ROOT REPLACEMENT + mechanical AV;  Surgeon: Rexene Alberts, MD;  Location: [place];  Service: Open Heart Surgery;  Laterality: N/A;   BIOPSY  06/05/2021   Procedure: BIOPSY;  Surgeon: Thornton Park, MD;  Location: [facility];  Service: Gastroenterology;;   CARDIAC CATHETERIZATION N/A 09/29/2016   Clean coronaries, EF 60-65%.  Procedure: Right/Left Heart Cath and Coronary Angiography;  Surgeon: Jettie Booze, MD;  Location: [place] [facility];  Service: Cardiovascular;  Laterality: N/A;   CHOLECYSTECTOMY  1980   COLONOSCOPY WITH PROPOFOL N/A 06/05/2021   diverticulosis descending and sigmoid, int+ext nonbleeding hemorr, 1 sessile seratted polyp w/out cytologic dysplasia. Procedure: COLONOSCOPY WITH PROPOFOL;  Surgeon: Thornton Park, MD;  Location: [facility];  Service: Gastroenterology;  Laterality: N/A;   DIRECT LARYNGOSCOPY Left 10/17/2015   Procedure: MICRO DIRECT LARYNGOSCOPY WITH FROZEN SECTION;  Surgeon: Jodi Marble, MD;  Location: [place];  Service: ENT;  Laterality: Left;   ESOPHAGOGASTRODUODENOSCOPY (EGD) WITH PROPOFOL N/A 06/05/2021   Barrett's esoph.  Gastritis (h pylori neg), multiple benign gastric polyps. Procedure: ESOPHAGOGASTRODUODENOSCOPY (EGD) WITH PROPOFOL;  Surgeon: Thornton Park, MD;  [facility];  Service: Gastroenterology;  Laterality: N/A;   LARYNX SURGERY  2002   removal vocal cord tumor (cancer)   MITRAL VALVE REPAIR N/A 11/03/2016   Procedure: MITRAL VALVE REPAIR (MVR);  Surgeon: Rexene Alberts, MD;  Location: [place];  Service: Open Heart Surgery;  Laterality: N/A;   PERIPHERAL VASCULAR CATHETERIZATION N/A 09/29/2016   Procedure: Abdominal Aortogram;  Surgeon: Jettie Booze, MD;  Location: [place] [facility];  Service: Cardiovascular;  Laterality: N/A;   PERIPHERAL VASCULAR CATHETERIZATION N/A 09/29/2016   Procedure: Thoracic Aortogram;  Surgeon: Jettie Booze, MD;  Location: [place] [facility];  Service: Cardiovascular;  Laterality: N/A;   POLYPECTOMY  06/05/2021   Procedure: POLYPECTOMY;  Surgeon: Thornton Park, MD;  [facility];  Service: Gastroenterology;;   TEE WITHOUT CARDIOVERSION N/A  09/17/2016   Procedure: TRANSESOPHAGEAL ECHOCARDIOGRAM (TEE);  Surgeon: Jerline Pain, MD;  Location: [place];  Service: Cardiovascular;  Laterality: N/A;   TEE WITHOUT CARDIOVERSION N/A 11/03/2016   Procedure: TRANSESOPHAGEAL ECHOCARDIOGRAM (TEE);  Surgeon: Rexene Alberts, MD;  Location: [place];  Service: Open Heart Surgery;  Laterality: N/A;   TONSILLECTOMY AND ADENOIDECTOMY     child   TRANSESOPHAGEAL ECHOCARDIOGRAM  09/17/2016   Severe MR with ruptured chordae.  Mod/severe AR w/functional bicuspid AV.  EF 45%.    Social History  reports that he quit smoking about 33 years ago. His smoking use included cigarettes. He has never used smokeless tobacco. He reports current alcohol use. He reports that he does not use drugs.  No Known Allergies  Family History  Problem Relation Age of Onset   Heart disease Father    Breast cancer Mother    Breast cancer Sister    Diabetes Brother    Kidney failure Son    Breast cancer Sister     Prior to Admission medications   Medication Sig Start Date End Date Taking? Authorizing Provider  acetaminophen (TYLENOL) 500 MG tablet Take 500-1,000 mg by mouth every 6 (six) hours as needed for mild pain (depends on pain level if takes 500 mg or 1000 mg).    [provider]  aspirin EC 81 MG EC tablet Take 1 tablet (81 mg total) by mouth daily. 11/10/16   Nani Skillern, PA-C  fenofibrate (TRICOR) 145 MG tablet TAKE 1 TABLET BY MOUTH DAILY Patient taking differently: Take 145 mg by mouth daily. TAKE 1 TABLET BY MOUTH DAILY 11/09/22   [name], [name], MD  furosemide (LASIX) 20 MG tablet Take 1 tablet (20 mg total) by mouth daily as needed for fluid or edema (2 or more pounds). Patient taking differently: Take 20 mg by mouth daily as needed for fluid or edema (2 or more pounds). 09/20/18   [name], [name], MD  HYDROcodone-acetaminophen (NORCO/VICODIN) 5-325 MG tablet Take 2 tablets by mouth every 6 (six) hours as needed for moderate  pain. 12/25/22 12/25/23  [name], MD  irbesartan (AVAPRO) 300 MG tablet TAKE 1 TABLET BY MOUTH EVERY DAY Patient taking differently: Take 300 mg by mouth daily. 05/06/22   Jerline Pain, MD  metoprolol succinate (TOPROL-XL) 50 MG 24 hr tablet TAKE 1 TABLET BY MOUTH EVERY DAY Patient taking differently: Take 50 mg by mouth daily. 06/18/22   Jerline Pain, MD  Multiple Vitamins-Minerals (MULTIVITAMIN PO) Take 1 tablet by mouth daily.     [provider]  pantoprazole (PROTONIX) 40 MG tablet TAKE 1 TABLET BY MOUTH EVERY DAY Patient taking differently: Take 40 mg by mouth daily. 05/12/22   Jerline Pain, MD  warfarin (COUMADIN) 5 MG tablet Take 1/2-1 tablet daily as directed by the coumadin clinic. Patient taking differently: Take by mouth as directed. Take 1/2-1 tablet daily as directed by the coumadin clinic. 12/19/21   Jerline Pain, MD    Physical Exam: Vitals:   12/27/22 1314 12/27/22 1330 12/27/22 1515 12/27/22 1615  BP: 128/72  (!) 159/71 130/65  Pulse: 64  70 (!) 50  Resp: '16  15 10  '$ Temp: 97.6 F (36.4 C)     TempSrc: Oral     SpO2: 98%  100% 99%  Weight:  99.8 kg    Height:  6' (1.829 m)      Constitutional: NAD, calm, comfortable Eyes: PERRL, lids and conjunctivae normal ENMT: Mucous membranes are moist.  Respiratory: clear to auscultation bilaterally, no wheezing, no crackles. Normal respiratory effort. No accessory muscle use.  Cardiovascular: Regular rate and rhythm,  No extremity edema. 2+ pedal pulses. No carotid bruits.  Abdomen: no tenderness, not distended, Bowel sounds positive.  Musculoskeletal: right arm cold to touch, not able to palpate radial pulse .  Skin: no rashes, lesions, ulcers. No induration Neurologic: CN 2-12 grossly intact. Sensation intact, Strength 5/5 in all 4.  Psychiatric: Normal judgment and insight. Alert and oriented x 3. Normal mood.    Labs on Admission: I have personally reviewed following labs and imaging  studies  CBC: Recent Labs  Lab 12/25/22 0624 12/27/22 1413  WBC  --  8.1  NEUTROABS  --  4.5  HGB 15.0 14.2  HCT 44.0 44.4  MCV  --  91.0  PLT  --  767    Basic Metabolic Panel: Recent Labs  Lab 12/25/22 0624 12/27/22 1413  NA 143 137  K 4.2 4.0  CL 108 102  CO2  --  28  GLUCOSE 112* 87  BUN 16 14  CREATININE 1.10  1.12  CALCIUM  --  8.8*    GFR: Estimated Creatinine Clearance: 70.8 mL/min (by C-G formula based on SCr of 1.12 mg/dL).  Liver Function Tests: Recent Labs  Lab 12/27/22 1413  AST 31  ALT 23  ALKPHOS 38  BILITOT 0.9  PROT 6.7  ALBUMIN 3.8    Urine analysis:    Component Value Date/Time   COLORURINE YELLOW 10/30/2016 [place] 10/30/2016 1232   LABSPEC 1.024 10/30/2016 1232   PHURINE 6.0 10/30/2016 1232   GLUCOSEU NEGATIVE 10/30/2016 1232   HGBUR NEGATIVE 10/30/2016 1232   BILIRUBINUR NEGATIVE 10/30/2016 1232   KETONESUR NEGATIVE 10/30/2016 1232   PROTEINUR NEGATIVE 10/30/2016 1232   NITRITE NEGATIVE 10/30/2016 1232   LEUKOCYTESUR NEGATIVE 10/30/2016 1232    Radiological Exams on Admission: VAS [place] UPPER EXTREMITY ARTERIAL DUPLEX  Result Date: 12/27/2022  UPPER EXTREMITY DUPLEX STUDY Patient Name:  Kenneth Ryan  Date of Exam:   12/27/2022 Medical Rec #: [id number]          Accession #:    [id number] Date of Birth: 1946-06-07          Patient Gender: M Patient Age:   31 years Exam Location[facility]  [facility] Procedure:      VAS [place] UPPER EXTREMITY ARTERIAL DUPLEX Referring Phys: COOPER ROBBINS --------------------------------------------------------------------------------  History: Patient has a history of upper extremity pain and tingling of fingers.          On Coumadin status post mitral valve replacement, however, stopped          Coumadin for procedure for approximately 6 days, restarted today.  Comparison Study: No prior study on file Performing Technologist: Sharion Dove RVS  Examination Guidelines: A complete  evaluation includes B-mode imaging, spectral Doppler, color Doppler, and power Doppler as needed of all accessible portions of each vessel. Bilateral testing is considered an integral part of a complete examination. Limited examinations for reoccurring indications may be performed as noted.  Right Doppler Findings: +--------------+----------+---------+--------+--------+ Site          PSV (cm/s)Waveform StenosisComments +--------------+----------+---------+--------+--------+ Subclavian Mid93        triphasic                 +--------------+----------+---------+--------+--------+ Axillary      72        triphasic                 +--------------+----------+---------+--------+--------+ Brachial Prox 44        triphasic                 +--------------+----------+---------+--------+--------+ Brachial Mid  35        triphasic                 +--------------+----------+---------+--------+--------+ Brachial Dist 20        triphasic                 +--------------+----------+---------+--------+--------+ Radial Prox   12                                  +--------------+----------+---------+--------+--------+ Radial Mid    10                                  +--------------+----------+---------+--------+--------+ Radial Dist   14                                  +--------------+----------+---------+--------+--------+  Ulnar Prox    0                  occluded         +--------------+----------+---------+--------+--------+ Ulnar Mid     0                                   +--------------+----------+---------+--------+--------+ Ulnar Dist    0                                   +--------------+----------+---------+--------+--------+ Palmar Arch   11                                  +--------------+----------+---------+--------+--------+ Acute thrombus noted at the brachial bifurcation at the [facility]. No flow noted in ulnar artery. Severely dampened  monophasic/near thumping waveform noted in the radial artery.  Summary:  Right: Obstruction noted in the Brachial bifurcation and ulnar        artery. Acute thrombus noted at the brachial bifurcation at        the [facility]. No flow noted in ulnar artery. Severely dampened        monophasic/near thumping waveform noted in the radial artery. *See table(s) above for measurements and observations.    Preliminary       Assessment/Plan Active Problems:   * No active hospital problems. *    Acute arterial thrombus / occlusion , RUE -started on heparin drip, npo, urgent embolectomy , plan per vascular surgery  severe aortic insufficiency s/p mechanical AVR and Bentall aortic root replacement in 10/2016  Currently on heparin drip, resume  Coumadin when ok with vascular surgery  non-ischemic cardiomyopathy with EF of 55-60% on Echo in 04/2022,  On lasix as needed at home Appear euvolemic currently    HTN Bp stable currently    DVT prophylaxis: heparin drip    Code Status:   full Family Communication:  None at bedside   Patient is from: Home    Anticipated DC to: Home    Anticipated DC date: > 24hrs    Consults called:  Vascular surgery Admission status:  Inpatient   Severity of Illness:   The appropriate patient status for this patient is INPATIENT due to history and comorbidities, severity of illness, required intensity of service to ensure the patient's safety and to avoid risk of adverse events/further clinical deterioration.  Severity of illness/comorbidities: Intensity of service: tests, high frequency of surveillance, interventions It is not anticipated that the patient will be medically stable for discharge from the hospital within 2 midnights of admission.    Voice Recognition Viviann Spare dictation system was used to create this note, attempts have been made to correct errors. Please contact the author with questions and/or clarifications.  Florencia Reasons MD PhD FACP [facility]  How to contact the [facility] Attending or Consulting provider [place] or covering provider during after hours [place], for this patient?   Check the care team in [facility] and look for a) attending/consulting [facility] provider listed and b) the [facility] team listed Log into [email or web address] and use [place]'s universal password to access. If you do not have the password, please contact the hospital operator. Locate the [facility] provider you are looking for under [facility] and page to a number  that you can be directly reached. If you still have difficulty reaching the provider, please page the [facility] (Director on Call) for the Hospitalists listed on amion for assistance.  12/27/2022, 5:59 PM   .

## 2022-12-27 NOTE — ED Notes (Signed)
[facility]  started heparin prior to transport

## 2022-12-27 NOTE — ED Notes (Signed)
Report called to [facility] charge nurse Roselyn Reef )

## 2022-12-27 NOTE — Progress Notes (Signed)
VASCULAR LAB    Right upper extremity arterial duplex has been performed.  See CV proc for preliminary results.  Gave verbal report to Dr. Maurie Boettcher, [facility], RVT 12/27/2022, 4:20 PM

## 2022-12-27 NOTE — Op Note (Signed)
    NAME: Kenneth Ryan    MRN: [id number] DOB: 18-Feb-1946    DATE OF OPERATION: 12/27/2022  PREOP DIAGNOSIS:    Right upper extremity acute limb ischemia  POSTOP DIAGNOSIS:    Same  PROCEDURE:    Right brachial artery cutdown,  Embolectomy of the brachial artery, ulnar artery, radial artery  SURGEON: Broadus John  ASSIST: Leontine Locket, PA  ANESTHESIA: General  EBL: 25 mL  INDICATIONS:    [name] is a 77 y.o. male who recently held his Coumadin for brachytherapy seed placement.  He awoke this morning with tingling in the right arm.  This has continued, with associated pain.  Duplex ultrasound demonstrated thrombus at the right brachial artery bifurcation.  After discussing the risk and benefits of brachial artery cutdown, Fogarty embolectomy, Tim elected to proceed.  FINDINGS:   Chronic appearing thrombus at the brachial bifurcation with thrombus extending into the ulnar artery.  No thrombus in the radial artery.   TECHNIQUE:   Patient was brought to the OR laid in supine position.  General anesthesia was induced and patient was prepped draped in standard fashion.  A timeout was performed.  The case began with ultrasound insonation of the right brachial artery.  The brachial artery bifurcation was identified, and a longitudinal incision was made on the forearm.  The brachial artery was exposed in standard fashion along with the ulnar artery and radial artery.  All arteries were controlled with Vesseloops.  The patient was heparinized to an ACT greater than 250, and a transverse arteriotomy was made.  A large piece of thrombus was removed.  This appeared chronic.  Next, a #3 Fogarty embolectomy catheter was passed distally down the ulnar artery.  There was thrombotic return.  The Fogarty was passed until there were multiple Clean passes.  Next, my attention turned to the radial artery.  Several passes were made.  All were clean.  The proximal brachial artery was  last.  The #3 Fogarty embolectomy catheter was hubbed and pulled from the axillary artery through the brachial artery.  This was done several times with no thrombotic return.  The transverse arteriotomy was closed with interrupted 6-0 Prolene sutures.  Patient had a palpable radial and ulnar pulse at the level of the wrist with a triphasic signal in the palmar arch.  The wound was copiously irrigated with saline and hemostasis achieved with use of cautery and suture.  3-0 Vicryl suture was used to close the deep space. I elected to continue his heparin drip in an effort to prevent further thrombus forming, and therefore chose to use staples at the level of the skin.  Impression: Successful brachial artery thrombectomy with palpable radial and ulnar arteries at case completion.   Macie Burows, MD [facility] DATE OF DICTATION:   12/27/2022

## 2022-12-27 NOTE — Consult Note (Signed)
Hospital Consult    Reason for Consult: Right upper extremity acute limb ischemia Requesting Physician: ED MRN #:  [id number]  History of Present Illness: This is a 77 y.o. male who presented to the hospital with numbness and tingling in the right arm.  Patient is on anticoagulation with due to mechanical aortic valve that was placed on 11/03/2016.  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  He restarted his warfarin today.  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   On exam, Kenneth Ryan was comfortable.  He noted numbness and tingling in the right hand.  No motor deficits.  No other complaints.   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   followed by [facility]   Chronic hoarseness    secondary to hx of laryngeal surgery   GERD (gastroesophageal reflux disease)    Barrett's esoph on EGD 06/06/21   History of basal cell carcinoma    nose   History of laryngeal cancer 2002   Surgery and radiation completed same year 2002---  oncologist released pt summer 2014   (benign bx by dr [name] in 10/ 2016)   History of lower GI bleeding 05/2021   s/p   EGD w/some non bleeding gastritis, Colonoscopy with int/ext hemorrhoids, 1 polyp   Hyperlipidemia, mixed    Malignant neoplasm prostate ([place]) 07/2022   urologist-- wrenn/  radiation oncologist--- dr [name];  dx 08/ 2023,  Gleason 3+4   Nonischemic cardiomyopathy ([place]) 08/2016   followed by cardiologist-  dr [name];    per echo ef 40-45%  09/ 2017;   echo post surgery in 2017  ef 35%;  improved 2019 ;  last echo 05-07-2022  ef 55-60%   Pre-diabetes    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  Per CV surg, as of 02/16/17, his INR goal is 1.5-2.5.   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   Severe Mitral regurg;   s/p  Complex valvuloplasty including artificial Gore-tex neochord placement x6 and 28 mm Sorin Memo 3D ring annuloplasty   SOB (shortness of breath) on exertion    normal activities ok but more strenous acitivity sob which is baseline per cardiologist note   Wears glasses     Past Surgical History:  Procedure Laterality Date   BENTALL PROCEDURE N/A 11/03/2016   Procedure: BENTALL PROCEDURE AORTIC ROOT REPLACEMENT + mechanical AV;  Surgeon: Rexene Alberts, MD;  Location: [place];  Service: Open Heart Surgery;  Laterality: N/A;   BIOPSY  06/05/2021   Procedure: BIOPSY;  Surgeon: Thornton Park, MD;  Location: [facility];  Service: Gastroenterology;;   CARDIAC CATHETERIZATION N/A 09/29/2016   Clean coronaries, EF 60-65%.  Procedure: Right/Left Heart Cath and Coronary Angiography;  Surgeon: Jettie Booze, MD;  Location: [facility];  Service: Cardiovascular;  Laterality: N/A;   CHOLECYSTECTOMY  1980   COLONOSCOPY WITH PROPOFOL N/A 06/05/2021   diverticulosis descending and sigmoid, int+ext nonbleeding hemorr, 1 sessile seratted polyp w/out cytologic dysplasia. Procedure: COLONOSCOPY WITH PROPOFOL;  Surgeon: Thornton Park, MD;  Location: [facility];  Service: Gastroenterology;  Laterality: N/A;   DIRECT LARYNGOSCOPY Left 10/17/2015   Procedure: MICRO DIRECT LARYNGOSCOPY WITH FROZEN SECTION;  Surgeon: Jodi Marble, MD;  Location: [place];  Service: ENT;  Laterality: Left;   ESOPHAGOGASTRODUODENOSCOPY (EGD) WITH PROPOFOL N/A 06/05/2021   Barrett's esoph.  Gastritis (h pylori neg), multiple benign gastric polyps. Procedure: ESOPHAGOGASTRODUODENOSCOPY (EGD) WITH PROPOFOL;  Surgeon: Thornton Park, MD;  Location:  [facility];  Service: Gastroenterology;  Laterality: N/A;   LARYNX SURGERY  2002   removal vocal cord tumor (cancer)   MITRAL VALVE REPAIR N/A 11/03/2016   Procedure: MITRAL VALVE REPAIR (MVR);  Surgeon: Rexene Alberts, MD;  Location: [facility];  Service: Open Heart Surgery;  Laterality: N/A;   PERIPHERAL VASCULAR CATHETERIZATION N/A 09/29/2016   Procedure: Abdominal Aortogram;  Surgeon: Jettie Booze, MD;  Location: [facility];  Service: Cardiovascular;  Laterality: N/A;   PERIPHERAL VASCULAR CATHETERIZATION N/A 09/29/2016   Procedure: Thoracic Aortogram;  Surgeon: Jettie Booze, MD;  Location: [facility];  Service: Cardiovascular;  Laterality: N/A;   POLYPECTOMY  06/05/2021   Procedure: POLYPECTOMY;  Surgeon: Thornton Park, MD;  [facility];  Service: Gastroenterology;;   TEE WITHOUT CARDIOVERSION N/A 09/17/2016   Procedure: TRANSESOPHAGEAL ECHOCARDIOGRAM (TEE);  Surgeon: Jerline Pain, MD;  Location: [place];  Service: Cardiovascular;  Laterality: N/A;   TEE WITHOUT CARDIOVERSION N/A 11/03/2016   Procedure: TRANSESOPHAGEAL ECHOCARDIOGRAM (TEE);  Surgeon: Rexene Alberts, MD;  Location: [place];  Service: Open Heart Surgery;  Laterality: N/A;   TONSILLECTOMY AND ADENOIDECTOMY     child   TRANSESOPHAGEAL ECHOCARDIOGRAM  09/17/2016   Severe MR with ruptured chordae.  Mod/severe AR w/functional bicuspid AV.  EF 45%.    No Known Allergies  Prior to Admission medications   Medication Sig Start Date End Date Taking? Authorizing Provider  acetaminophen (TYLENOL) 500 MG tablet Take 500-1,000 mg by mouth every 6 (six) hours as needed for mild pain (depends on pain level if takes 500 mg or 1000 mg).    [provider]  aspirin EC 81 MG EC tablet Take 1 tablet (81 mg total) by mouth daily. 11/10/16   Nani Skillern, PA-C  fenofibrate (TRICOR) 145 MG tablet TAKE 1 TABLET BY MOUTH DAILY Patient taking differently: Take 145 mg by mouth  daily. TAKE 1 TABLET BY MOUTH DAILY 11/09/22   [name], [name], MD  furosemide (LASIX) 20 MG tablet Take 1 tablet (20 mg total) by mouth daily as needed for fluid or edema (2 or more pounds). Patient taking differently: Take 20 mg by mouth daily as needed for fluid or edema (2 or more pounds). 09/20/18   [name], [name], MD  HYDROcodone-acetaminophen (NORCO/VICODIN) 5-325 MG tablet Take 2 tablets by mouth every 6 (six) hours as needed for moderate pain. 12/25/22 12/25/23  [name], MD  irbesartan (AVAPRO) 300 MG tablet TAKE 1 TABLET BY MOUTH EVERY DAY Patient taking differently: Take 300 mg by mouth daily. 05/06/22   Jerline Pain, MD  metoprolol succinate (TOPROL-XL) 50 MG 24 hr tablet TAKE 1 TABLET BY MOUTH EVERY DAY Patient taking differently: Take 50 mg by mouth daily. 06/18/22   Jerline Pain, MD  Multiple Vitamins-Minerals (MULTIVITAMIN PO) Take 1 tablet by mouth daily.     [provider]  pantoprazole (PROTONIX) 40 MG tablet TAKE 1 TABLET BY MOUTH EVERY DAY Patient taking differently: Take 40 mg by mouth daily. 05/12/22   Jerline Pain, MD  warfarin (COUMADIN) 5 MG tablet Take 1/2-1 tablet daily as directed by the coumadin clinic. Patient taking differently: Take by mouth as directed. Take 1/2-1 tablet daily as directed by the coumadin clinic. 12/19/21   Jerline Pain, MD    Social History   Socioeconomic History   Marital status: Widowed    Spouse name: Not on file   Number of children: 1   Years of education: Not on file   Highest education level: Not on file  Occupational History   Occupation: retired  Tobacco Use   Smoking status: Former    Years: 22.00    Types: Cigarettes    Quit date: 07/21/1989    Years since quitting: 33.4   Smokeless tobacco: Never  Vaping Use   Vaping Use: Never used  Substance and Sexual Activity   Alcohol use: Yes    Comment: rare   Drug use: Never   Sexual activity: Not on file  Other Topics Concern   Not on file  Social  History Narrative   Widower (approx 03-17-2014), one son deceased Mar 18, 2011.   Relocated from [place] to [facility] 06/2013 to be near grandchildren.   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   Smoker 20 pack-yr hx: quit about 1980.   Alcohol: 3 bottles of beer per week avg.     Drugs: none         Social Determinants of Health   Financial Resource Strain: Low Risk  (03/12/2022)   Overall Financial Resource Strain (CARDIA)    Difficulty of Paying Living Expenses: Not hard at all  Food Insecurity: No Food Insecurity (03/12/2022)   Hunger Vital Sign    Worried About Running Out of Food in the Last Year: Never true    Ran Out of Food in the Last Year: Never true  Transportation Needs: No Transportation Needs (03/12/2022)   PRAPARE - Hydrologist (Medical): No    Lack of Transportation (Non-Medical): No  Physical Activity: Inactive (03/12/2022)   Exercise Vital Sign    Days of Exercise per Week: 0 days    Minutes of Exercise per Session: 0 min  Stress: No Stress Concern Present (03/12/2022)   [place]    Feeling of Stress : Only a little  Social Connections: Moderately Isolated (03/12/2022)   Social Connection and Isolation Panel [NHANES]    Frequency of Communication with Friends and Family: More than three times a week    Frequency of Social Gatherings with Friends and Family: Once a week    Attends Religious Services: More than 4 times per year    Active Member of [employer] or Organizations: No    Attends Archivist Meetings: Never    Marital Status: Widowed  Intimate Partner Violence: Not At Risk (03/12/2022)   Humiliation, Afraid, Rape, and Kick questionnaire    Fear of Current or Ex-Partner: No    Emotionally Abused: No    Physically Abused: No    Sexually Abused: No   Family History  Problem Relation Age of Onset   Heart disease Father    Breast cancer Mother    Breast cancer Sister    Diabetes  Brother    Kidney failure Son    Breast cancer Sister     ROS: Otherwise negative unless mentioned in HPI  Physical Examination  Vitals:   12/27/22 1515 12/27/22 1615  BP: (!) 159/71 130/65  Pulse: 70 (!) 50  Resp: 15 10  Temp:    SpO2: 100% 99%   Body mass index is 29.84 kg/m.  General:  WDWN in NAD Gait: Not observed HENT: WNL, normocephalic Pulmonary: normal non-labored breathing, without Rales, rhonchi,  wheezing Cardiac: regular Abdomen: soft, NT/ND, no masses Skin: without rashes Vascular Exam/Pulses: nonpalpable pulses right arm Extremities: without ischemic changes, without Gangrene , without cellulitis; without open wounds;  Musculoskeletal: no muscle wasting or atrophy  Neurologic: A&O X 3;  No focal weakness or paresthesias are detected; speech is fluent/normal Psychiatric:  The pt has Normal affect. Lymph:  Unremarkable  CBC    Component Value Date/Time   WBC 8.1 12/27/2022 1413   RBC 4.88 12/27/2022 1413   HGB 14.2 12/27/2022 1413   HGB 13.5 04/20/2022 0921   HCT 44.4 12/27/2022 1413   HCT 39.3 04/20/2022 0921   PLT 167 12/27/2022 1413   PLT 235 04/20/2022 0921   MCV 91.0 12/27/2022 1413   MCV 85 04/20/2022 0921   MCH 29.1 12/27/2022 1413   MCHC 32.0 12/27/2022 1413   RDW 13.5 12/27/2022 1413   RDW 12.2 04/20/2022 0921   LYMPHSABS 2.8 12/27/2022 1413   MONOABS 0.6 12/27/2022 1413   EOSABS 0.1 12/27/2022 1413   BASOSABS 0.0 12/27/2022 1413    BMET    Component Value Date/Time   NA 137 12/27/2022 1413   NA 142 04/20/2022 0921   K 4.0 12/27/2022 1413   CL 102 12/27/2022 1413   CO2 28 12/27/2022 1413   GLUCOSE 87 12/27/2022 1413   BUN 14 12/27/2022 1413   BUN 15 04/20/2022 0921   CREATININE 1.12 12/27/2022 1413   CREATININE 0.86 11/23/2016 1156   CALCIUM 8.8 (L) 12/27/2022 1413   GFRNONAA >60 12/27/2022 1413   GFRNONAA 89 11/23/2016 1156   GFRAA 102 06/16/2017 1145   GFRAA >89 11/23/2016 1156    COAGS: Lab Results  Component  Value Date   INR 1.1 12/27/2022   INR 3.6 (A) 11/20/2022   INR 2.6 10/16/2022     ASSESSMENT/PLAN: This is a 77 y.o. male with ultrasound findings demonstrating right brachial artery thrombus.  On physical exam, he had nonpalpable pulses with sensory deficits in the right arm which is been present for a number of hours.  After discussing risk and benefits of right upper extremity brachial embolectomy in an effort to restore flow to the right hand, Kenneth Ryan elected to proceed.   Cassandria Santee MD MS [facility] [phone] 12/27/2022  6:11 PM

## 2022-12-27 NOTE — Anesthesia Preprocedure Evaluation (Addendum)
Anesthesia Evaluation  Patient identified by MRN, date of birth, ID band Patient awake    Reviewed: Allergy & Precautions, NPO status , Patient's Chart, lab work & pertinent test results  Airway Mallampati: II  TM Distance: >3 FB Neck ROM: Full    Dental no notable dental hx.    Pulmonary former smoker   Pulmonary exam normal        Cardiovascular hypertension, Pt. on medications and Pt. on home beta blockers + Peripheral Vascular Disease and +CHF  Normal cardiovascular exam     Neuro/Psych negative neurological ROS  negative psych ROS   GI/Hepatic Neg liver ROS,GERD  Medicated and Controlled,,  Endo/Other  negative endocrine ROS    Renal/GU negative Renal ROS     Musculoskeletal negative musculoskeletal ROS (+)    Abdominal   Peds  Hematology  (+) Blood dyscrasia (on Warfarin)   Anesthesia Other Findings acute limb ischemia right  Reproductive/Obstetrics                             Anesthesia Physical Anesthesia Plan  ASA: 3 and emergent  Anesthesia Plan: General   Post-op Pain Management:    Induction: Intravenous  PONV Risk Score and Plan: 2 and Ondansetron, Dexamethasone and Treatment may vary due to age or medical condition  Airway Management Planned: Oral ETT  Additional Equipment: Arterial line  Intra-op Plan:   Post-operative Plan: Extubation in OR  Informed Consent: I have reviewed the patients History and Physical, chart, labs and discussed the procedure including the risks, benefits and alternatives for the proposed anesthesia with the patient or authorized representative who has indicated his/her understanding and acceptance.     Dental advisory given  Plan Discussed with: CRNA  Anesthesia Plan Comments:        Anesthesia Quick Evaluation

## 2022-12-27 NOTE — Progress Notes (Signed)
Patient arrived at the unit,CHG bath given,CCMD notified,vitals checked.brisk pulse present at rt radial,no complains of pain

## 2022-12-27 NOTE — ED Provider Triage Note (Signed)
Emergency Medicine Provider Triage Evaluation Note  Kenneth Ryan , a 77 y.o. male  was evaluated in triage.  Pt complains of right upper extremity numbness/tingling.  Patient reports symptoms beginning around 1030 this morning while sitting at church.  States has been off Coumadin for the past 6 days secondary to procedure on his prostate.  He began Coumadin again today.  Denies pain in affected extremity, changes in vision, weakness/sensory deficits elsewhere, slurred speech, facial droop, gait abnormalities.  Denies any known trauma/falls..  Review of Systems  Positive: See above Negative:   Physical Exam  BP 128/72 (BP Location: Left Arm)   Pulse 64   Temp 97.6 F (36.4 C) (Oral)   Resp 16   Ht 6' (1.829 m)   Wt 99.8 kg   SpO2 98%   BMI 29.84 kg/m  Gen:   Awake, no distress   Resp:  Normal effort  MSK:   Moves extremities without difficulty  Other:  Patient with pale and cool right hand.  Difficult to assess radial pulses with palpation.  With Doppler, weak and thready right radial pulse when compared to left.  Medical Decision Making  Medically screening exam initiated at 2:15 PM.  Appropriate orders placed.  Kenneth Ryan was informed that the remainder of the evaluation will be completed by another provider, this initial triage assessment does not replace that evaluation, and the importance of remaining in the ED until their evaluation is complete.     Wilnette Kales, [place] 12/27/22 1433

## 2022-12-28 ENCOUNTER — Other Ambulatory Visit (HOSPITAL_COMMUNITY): Payer: PPO

## 2022-12-28 ENCOUNTER — Encounter (HOSPITAL_COMMUNITY): Payer: Self-pay | Admitting: Vascular Surgery

## 2022-12-28 ENCOUNTER — Other Ambulatory Visit (HOSPITAL_COMMUNITY): Payer: Self-pay

## 2022-12-28 ENCOUNTER — Inpatient Hospital Stay (HOSPITAL_COMMUNITY): Payer: PPO

## 2022-12-28 DIAGNOSIS — I742 Embolism and thrombosis of arteries of the upper extremities: Secondary | ICD-10-CM | POA: Diagnosis not present

## 2022-12-28 DIAGNOSIS — Z952 Presence of prosthetic heart valve: Secondary | ICD-10-CM | POA: Diagnosis not present

## 2022-12-28 DIAGNOSIS — Z954 Presence of other heart-valve replacement: Secondary | ICD-10-CM

## 2022-12-28 DIAGNOSIS — I7121 Aneurysm of the ascending aorta, without rupture: Secondary | ICD-10-CM

## 2022-12-28 DIAGNOSIS — I10 Essential (primary) hypertension: Secondary | ICD-10-CM

## 2022-12-28 DIAGNOSIS — C61 Malignant neoplasm of prostate: Secondary | ICD-10-CM | POA: Diagnosis not present

## 2022-12-28 DIAGNOSIS — R310 Gross hematuria: Secondary | ICD-10-CM | POA: Insufficient documentation

## 2022-12-28 DIAGNOSIS — I420 Dilated cardiomyopathy: Secondary | ICD-10-CM

## 2022-12-28 DIAGNOSIS — Z9889 Other specified postprocedural states: Secondary | ICD-10-CM

## 2022-12-28 DIAGNOSIS — Z7901 Long term (current) use of anticoagulants: Secondary | ICD-10-CM

## 2022-12-28 LAB — ECHOCARDIOGRAM COMPLETE
AV Mean grad: 8 mmHg
AV Peak grad: 15.8 mmHg
Ao pk vel: 1.99 m/s
Area-P 1/2: 2.66 cm2
Height: 72.008 in
S' Lateral: 3.8 cm
Weight: 3519.99 oz

## 2022-12-28 LAB — HEPARIN LEVEL (UNFRACTIONATED)
Heparin Unfractionated: >1.1 IU/mL — ABNORMAL HIGH (ref 0.30–0.70)
Heparin Unfractionated: <0.1 IU/mL — ABNORMAL LOW (ref 0.30–0.70)

## 2022-12-28 LAB — CBC
HCT: 37.3 % — ABNORMAL LOW (ref 39.0–52.0)
Hemoglobin: 12.8 g/dL — ABNORMAL LOW (ref 13.0–17.0)
MCH: 29.7 pg (ref 26.0–34.0)
MCHC: 34.3 g/dL (ref 30.0–36.0)
MCV: 86.5 fL (ref 80.0–100.0)
Platelets: 160 10*3/uL (ref 150–400)
RBC: 4.31 MIL/uL (ref 4.22–5.81)
RDW: 13.2 % (ref 11.5–15.5)
WBC: 6.1 10*3/uL (ref 4.0–10.5)
nRBC: 0 % (ref 0.0–0.2)

## 2022-12-28 LAB — BASIC METABOLIC PANEL
Anion gap: 5 (ref 5–15)
BUN: 12 mg/dL (ref 8–23)
CO2: 26 mmol/L (ref 22–32)
Calcium: 8.4 mg/dL — ABNORMAL LOW (ref 8.9–10.3)
Chloride: 106 mmol/L (ref 98–111)
Creatinine, Ser: 1.18 mg/dL (ref 0.61–1.24)
GFR, Estimated: >60 mL/min (ref >60)
Glucose, Bld: 177 mg/dL — ABNORMAL HIGH (ref 70–99)
Potassium: 4.2 mmol/L (ref 3.5–5.1)
Sodium: 137 mmol/L (ref 135–145)

## 2022-12-28 LAB — PROTIME-INR
INR: 1.3 — ABNORMAL HIGH (ref 0.8–1.2)
Prothrombin Time: 16.1 seconds — ABNORMAL HIGH (ref 11.4–15.2)

## 2022-12-28 MED ORDER — HEPARIN (PORCINE) 25000 UT/250ML-% IV SOLN
1200.0000 [IU]/h | INTRAVENOUS | Status: DC
  Administered 2022-12-28 (×2): 500 [IU]/h via INTRAVENOUS
  Filled 2022-12-28: qty 250

## 2022-12-28 NOTE — Progress Notes (Signed)
Patient ID: Kenneth Ryan, male   DOB: 1946/05/03, 77 y.o.   MRN: [id number]  I was called earlier today about catheter placement since Kenneth Ryan had seeds on Friday and he had some hematuria on heparin today.  I suggested a 11f foley but that couldn't be successfully placed.    This evening, he is voiding some tea colored urine and did pass some clots earlier but that has resolved.    I don't think he needs the foley now.     Please reconsult as needed.

## 2022-12-28 NOTE — TOC Benefit Eligibility Note (Signed)
Patient Teacher, English as a foreign language completed.    The patient is currently admitted and upon discharge could be taking enoxaparin (Lovenox) 100 mg/ml inj.  The current 30 day co-pay is $100.00.   The patient is insured through [place] [place] Patient Advocate Specialist [place] Patient Advocate Team Direct Number: [phone]  Fax: [phone]

## 2022-12-28 NOTE — Evaluation (Signed)
Occupational Therapy Evaluation Patient Details Name: Kenneth Ryan MRN: [id number] DOB: 02/14/1946 Today's Date: 12/28/2022   History of Present Illness Pt is a 77 y.o. M who presents 12/27/2022 after recently holding his Coumadin for brachytherapy seed placement and tingling int he right arm. Duplex ultrasound demonstrated thrombus at the right artery bifurcation. Now s/p right brachial artery cutdown, embolectomy of brachial artery, ulnar artery, radial artery 12/27/2022. Significant PMH: CAD, non ischemic cardiomyopathy, severe aortic insufficiency s/p mechanical AVR, mitral valve repair 10/2016, prostate CA.   Clinical Impression   PTA patient independent.  Admitted for above and presents with mild numbness in L thumb, decreased functional use of R UE.  Improved functional ROM, pain limited ROM at elbow.  Encouraged exercises 3x/day, elevation.  Pt able to complete ADLs with independence.  No further OT needs identified.  OT will sign off.      Recommendations for follow up therapy are one component of a multi-disciplinary discharge planning process, led by the attending physician.  Recommendations may be updated based on patient status, additional functional criteria and insurance authorization.   Follow Up Recommendations  No OT follow up     Assistance Recommended at Discharge PRN  Patient can return home with the following Assistance with cooking/housework    Functional Status Assessment     Equipment Recommendations       Recommendations for Other Services       Precautions / Restrictions Precautions Precautions: None Restrictions Weight Bearing Restrictions: No      Mobility Bed Mobility Overal bed mobility: Modified Independent                  Transfers Overall transfer level: Independent Equipment used: None                      Balance Overall balance assessment: No apparent balance deficits (not formally assessed)                                          ADL either performed or assessed with clinical judgement   ADL Overall ADL's : Modified independent                                             Vision   Vision Assessment?: No apparent visual deficits     Perception     Praxis      Pertinent Vitals/Pain Pain Assessment Faces Pain Scale: Hurts a little bit Pain Location: incisional Pain Descriptors / Indicators: Discomfort, Operative site guarding Pain Intervention(s): Monitored during session, Repositioned     Hand Dominance Right   Extremity/Trunk Assessment Upper Extremity Assessment Upper Extremity Assessment: RUE deficits/detail RUE Deficits / Details: s/p brachial artery cutdown. Reports mild numbness in thumb. ROM WFL RUE Sensation: decreased light touch RUE Coordination: decreased fine motor;decreased gross motor   Lower Extremity Assessment Lower Extremity Assessment: Defer to PT evaluation   Cervical / Trunk Assessment Cervical / Trunk Assessment: Normal   Communication Communication Communication: No difficulties   Cognition Arousal/Alertness: Awake/alert Behavior During Therapy: WFL for tasks assessed/performed Overall Cognitive Status: Within Functional Limits for tasks assessed  General Comments  reviewed exercises 3x/day and elevation to R UE- cueing to not push past pain but gradually increased ROM    Exercises     Shoulder Instructions      Home Living Family/patient expects to be discharged to:: Private residence Living Arrangements: Children (daughter) Available Help at Discharge: Family Type of Home: House Home Access: Stairs to enter;Ramped entrance Entrance Stairs-Number of Steps: 2   Home Layout: Able to live on main level with bedroom/bathroom     Bathroom Shower/Tub: Tub/shower unit;Walk-in shower                    Prior Functioning/Environment Prior Level of  Function : Independent/Modified Independent             Mobility Comments: working on his "Hot Rod," has 3 dogs          OT Problem List: Impaired sensation;Impaired UE functional use;Pain      OT Treatment/Interventions:      OT Goals(Current goals can be found in the care plan section) Acute Rehab OT Goals Patient Stated Goal: home OT Goal Formulation: With patient  OT Frequency:      Co-evaluation              AM-PAC OT "6 Clicks" Daily Activity     Outcome Measure Help from another person eating meals?: None Help from another person taking care of personal grooming?: None Help from another person toileting, which includes using toliet, bedpan, or urinal?: None Help from another person bathing (including washing, rinsing, drying)?: None Help from another person to put on and taking off regular upper body clothing?: None Help from another person to put on and taking off regular lower body clothing?: None 6 Click Score: 24   End of Session Nurse Communication: Mobility status  Activity Tolerance: Patient tolerated treatment well Patient left: in bed;with call bell/phone within reach  OT Visit Diagnosis: Pain Pain - Right/Left: Right Pain - part of body: Arm                Time: 3491-7915 OT Time Calculation (min): 10 min Charges:  OT General Charges $OT Visit: 1 Visit OT Evaluation $OT Eval Low Complexity: 1 Low  Jolaine Artist, OT Acute Rehabilitation Services Office [phone]   Delight Stare 12/28/2022, 9:59 AM

## 2022-12-28 NOTE — Progress Notes (Signed)
Urine getting more lighter.  I fresh red blood or clots. No problem voiding. No indication for foley. Will monitor urine output and do bladder scan overnight. Notified RN.

## 2022-12-28 NOTE — Evaluation (Signed)
Physical Therapy Evaluation and Discharge Patient Details Name: Kenneth Ryan MRN: [id number] DOB: [birth year] Today's Date: 12/28/2022  History of Present Illness  Pt is a 77 y.o. M who presents 12/27/2022 after recently holding his Coumadin for brachytherapy seed placement and tingling int he right arm. Duplex ultrasound demonstrated thrombus at the right artery bifurcation. Now s/p right brachial artery cutdown, embolectomy of brachial artery, ulnar artery, radial artery 12/27/2022. Significant PMH: CAD, non ischemic cardiomyopathy, severe aortic insufficiency s/p mechanical AVR, mitral valve repair 10/2016, prostate CA.  Clinical Impression  Patient evaluated by Physical Therapy with no further acute PT needs identified. Pt overall verbalizing good pain control and is mobilizing well. Pt ambulating 400 ft with no assistive device independently. Reports mild numbness in thumb, but overall improved sensation and ROM WFL in shoulder/elbow/wrist/hand. Encouraged elevation, RUE activity restrictions, and ROM. All education has been completed and the patient has no further questions. No follow-up Physical Therapy or equipment needs. PT is signing off. Thank you for this referral.      Recommendations for follow up therapy are one component of a multi-disciplinary discharge planning process, led by the attending physician.  Recommendations may be updated based on patient status, additional functional criteria and insurance authorization.  Follow Up Recommendations No PT follow up      Assistance Recommended at Discharge Set up Supervision/Assistance  Patient can return home with the following  Assist for transportation    Equipment Recommendations None recommended by PT  Recommendations for Other Services       Functional Status Assessment Patient has had a recent decline in their functional status and demonstrates the ability to make significant improvements in function in a reasonable and  predictable amount of time.     Precautions / Restrictions Precautions Precautions: None Restrictions Weight Bearing Restrictions: No      Mobility  Bed Mobility Overal bed mobility: Modified Independent                  Transfers Overall transfer level: Independent Equipment used: None                    Ambulation/Gait Ambulation/Gait assistance: Independent Gait Distance (Feet): 400 Feet Assistive device: None Gait Pattern/deviations: WFL(Within Functional Limits)          Stairs            Wheelchair Mobility    Modified Rankin (Stroke Patients Only)       Balance Overall balance assessment: No apparent balance deficits (not formally assessed)                                           Pertinent Vitals/Pain Pain Assessment Pain Assessment: Faces Faces Pain Scale: Hurts a little bit Pain Location: incisional Pain Descriptors / Indicators: Discomfort, Operative site guarding Pain Intervention(s): Monitored during session    Home Living Family/patient expects to be discharged to:: Private residence Living Arrangements: Children (daughter) Available Help at Discharge: Family Type of Home: House Home Access: Stairs to enter;Ramped entrance   Entrance Stairs-Number of Steps: 2   Home Layout: Able to live on main level with bedroom/bathroom        Prior Function Prior Level of Function : Independent/Modified Independent             Mobility Comments: working on his "Hot Rod," has 3 dogs  Hand Dominance        Extremity/Trunk Assessment   Upper Extremity Assessment Upper Extremity Assessment: Overall WFL for tasks assessed;RUE deficits/detail RUE Deficits / Details: s/p brachial artery cutdown. Reports mild numbness in thumb. ROM WFL    Lower Extremity Assessment Lower Extremity Assessment: Overall WFL for tasks assessed    Cervical / Trunk Assessment Cervical / Trunk Assessment: Normal   Communication   Communication: No difficulties  Cognition Arousal/Alertness: Awake/alert Behavior During Therapy: WFL for tasks assessed/performed Overall Cognitive Status: Within Functional Limits for tasks assessed                                          General Comments      Exercises     Assessment/Plan    PT Assessment Patient does not need any further PT services  PT Problem List         PT Treatment Interventions      PT Goals (Current goals can be found in the Care Plan section)  Acute Rehab PT Goals Patient Stated Goal: return to independence and fixing old cars PT Goal Formulation: All assessment and education complete, DC therapy    Frequency       Co-evaluation               AM-PAC PT "6 Clicks" Mobility  Outcome Measure Help needed turning from your back to your side while in a flat bed without using bedrails?: None Help needed moving from lying on your back to sitting on the side of a flat bed without using bedrails?: None Help needed moving to and from a bed to a chair (including a wheelchair)?: None Help needed standing up from a chair using your arms (e.g., wheelchair or bedside chair)?: None Help needed to walk in hospital room?: None Help needed climbing 3-5 steps with a railing? : None 6 Click Score: 24    End of Session   Activity Tolerance: Patient tolerated treatment well Patient left: in bed;with call bell/phone within reach Nurse Communication: Mobility status PT Visit Diagnosis: Pain Pain - Right/Left: Right Pain - part of body: Arm    Time: 8546-2703 PT Time Calculation (min) (ACUTE ONLY): 16 min   Charges:   PT Evaluation $PT Eval Low Complexity: 1 Low          Wyona Almas, PT, DPT Acute Rehabilitation Services Office [phone]   Deno Etienne 12/28/2022, 8:53 AM

## 2022-12-28 NOTE — Progress Notes (Signed)
Patient had hematuria,pinkish in appearance informed to pharmacy,advised continue to monitor.

## 2022-12-28 NOTE — Progress Notes (Signed)
Pt's Heparin level was >1.10, spoke to pharmacist she advised to put off removing A-line until heparin level was lower.

## 2022-12-28 NOTE — Progress Notes (Signed)
ANTICOAGULATION CONSULT NOTE - Follow Up Consult  Pharmacy Consult for heparin Indication:  thrombus at the right brachial artery bifurcation   Labs: Recent Labs    12/25/22 0624 12/27/22 1413 12/28/22 0350  HGB 15.0 14.2 12.8*  HCT 44.0 44.4 37.3*  PLT  --  167 160  LABPROT  --  14.3 16.1*  INR  --  1.1 1.3*  HEPARINUNFRC  --   --  >1.10*  CREATININE 1.10 1.12 1.18    Assessment: 76yo male supratherapeutic on heparin with initial dosing for VTE; no infusion issues per RN but she notes pink-tinged urine, Hgb has dropped some; noted that pt rec'd large heparin boluses during vascular procedure which could still be affecting level.  Goal of Therapy:  Heparin level 0.3-0.7 units/ml   Plan:  Will hold heparin infusion x1h then decrease heparin infusion by 3-4 units/kg/hr to 1200 units/hr and check level in 6 hours.    Wynona Neat, PharmD, BCPS  12/28/2022,4:53 AM

## 2022-12-28 NOTE — Progress Notes (Signed)
Mobility Specialist Progress Note:   12/28/22 1343  Mobility  Activity Ambulated with assistance in hallway  Level of Assistance Standby assist, set-up cues, supervision of patient - no hands on  Assistive Device None  Distance Ambulated (ft) 450 ft  Activity Response Tolerated well  $Mobility charge 1 Mobility   Pt received in bed willing to participate in mobility. No complaints of pain. Left in bed with call bell in reach and all needs met.   Gareth Eagle Gavinn Collard Mobility Specialist Please contact via [employer] or  Rehab Office at [phone]

## 2022-12-28 NOTE — Progress Notes (Signed)
Patient develop second episode of hematuria,bright red color this time,MD notified advice to decrease heparin to 500 units per hour.

## 2022-12-28 NOTE — Progress Notes (Addendum)
PROGRESS NOTE  Kenneth Ryan VQQ:[id number] DOB: [birth year]   PCP: Tammi Sou, MD  Patient is from: Home.  DOA: 12/27/2022 LOS: 1  Chief complaints Right arm numb and cold.  Brief Narrative / Interim history: 77 year old M with PMH of nonobstructive CAD, NICM with EF of 55 to 60% in 04/2022, severe aortic insufficiency s/p mechanical AVR and Bentall aortic replacement in 2017 on Coumadin, mitral valve repair, HLD, GERD, Barrett's esophagus and recent diagnosis of prostate cancer s/p brachytherapy seed on 12/25/2022 presented to ED due to right arm being cold and numb.  Patient's warfarin was held for recent prostate cancer treatment on 12/25/2022.  INR subtherapeutic.  Upper extremity duplex showed obstructing thrombus at right brachial bifurcation and ulnar artery. Patient was admitted from right upper extremity acute limb ischemia.  Started on IV heparin, and underwent embolectomy of brachial, ulnar and radial artery by vascular surgery on 12/27/2022.    Hospital course notable for gross hematuria with burgundy urine without fresh red blood or blood clots.  H&H relatively stable.  Subjective: Seen and examined earlier this morning.  Reports some residual numbness in his right thumb.  Reports gross hematuria with burgundy urine.  Denies difficulty urinating.  Objective: Vitals:   12/28/22 0100 12/28/22 0300 12/28/22 0400 12/28/22 0502  BP:  125/67  129/73  Pulse:  97  87  Resp: '20 20  18  '$ Temp:  97.6 F (36.4 C)    TempSrc:  Oral    SpO2:  91% 92% 93%  Weight:      Height:        Examination:  GENERAL: No apparent distress.  Nontoxic. HEENT: MMM.  Vision and hearing grossly intact.  NECK: Supple.  No apparent JVD.  RESP:  No IWOB.  Fair aeration bilaterally. CVS:  RRR. Heart sounds normal.  ABD/GI/GU: BS+. Abd soft, NTND.  MSK/EXT:  Moves extremities. No apparent deformity.  Ace wrap over right forearm.  Neurovascular intact. SKIN: no apparent skin lesion or  wound NEURO: Awake, alert and oriented appropriately.  No apparent focal neuro deficit. PSYCH: Calm. Normal affect.   Procedures:  1/7-embolectomy of brachial artery, ulnar artery and radial artery by Dr. Virl Cagey.  Microbiology summarized: MRSA PCR screen negative.  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 -S/p embolectomy of brachial artery, ulnar artery and radial artery by Dr. Unk Lightning on 1/7. -Continue warfarin with heparin bridge. -Continue low-dose aspirin. -Follow TTE  History of severe aortic insufficiency s/p mechanical aortic valve and Bentall aortic root replacement in 10/2016.  INR 1.3. -Anticoagulation as above. -Follow TTE  Ischemic cardiomyopathy with recovered EF: Takes Lasix as needed at home.  Appears euvolemic.   -Continue home Lasix as needed  Prostate cancer s/p radioactive seed implantation and cystoscopy by Dr. Jeffie Pollock on 1/5. Gross hematuria: [place] urine with sediments in urinal.  I do not think there is an indication for Foley catheter now since patient is voiding and there is no frank red blood or blood clot.  H&H relatively stable. -If needed, urology recommended trying a 78f coude foley with urethral lubrication to aid passage  -Strict intake and output and bladder scan every 8 hours.  Essential hypertension: Normotensive. -Continue home meds  Body mass index is 29.83 kg/m.           DVT prophylaxis:  SCD's Start: 12/27/22  2016 On full dose anticoagulation.  Code Status: Full code Family Communication: None at bedside Level of care: Progressive Status is: Inpatient Remains inpatient appropriate because: Acute right brachial artery thromboembolism and gross hematuria   Final disposition: Home once medically clear. Consultants:  Vascular surgery Urology  Sch Meds:  Scheduled Meds:  aspirin EC  81 mg Oral Daily   docusate sodium  100 mg Oral Daily   fenofibrate  160 mg Oral Daily   irbesartan  300 mg Oral Daily   metoprolol succinate  50 mg Oral Daily   pantoprazole  40 mg Oral Daily   Continuous Infusions:  sodium chloride     sodium chloride 50 mL/hr at 12/27/22 2137    ceFAZolin (ANCEF) IV 2 g (12/28/22 0444)   heparin 500 Units/hr (12/28/22 0620)   magnesium sulfate bolus IVPB     PRN Meds:.sodium chloride, acetaminophen **OR** acetaminophen, alum & mag hydroxide-simeth, bisacodyl, furosemide, guaiFENesin-dextromethorphan, hydrALAZINE, labetalol, magnesium sulfate bolus IVPB, metoprolol tartrate, morphine injection, ondansetron, oxyCODONE, phenol, polyethylene glycol, potassium chloride  Antimicrobials: Anti-infectives (From admission, onward)    Start     Dose/Rate Route Frequency Ordered Stop   12/28/22 0500  ceFAZolin (ANCEF) IVPB 2g/100 mL premix        2 g 200 mL/hr over 30 Minutes Intravenous Every 8 hours 12/27/22 2015 12/28/22 2059        I have personally reviewed the following labs and images: CBC: Recent Labs  Lab 12/25/22 0624 12/27/22 1413 12/28/22 0350  WBC  --  8.1 6.1  NEUTROABS  --  4.5  --   HGB 15.0 14.2 12.8*  HCT 44.0 44.4 37.3*  MCV  --  91.0 86.5  PLT  --  167 160   BMP &GFR Recent Labs  Lab 12/25/22 0624 12/27/22 1413 12/28/22 0350  NA 143 137 137  K 4.2 4.0 4.2  CL 108 102 106  CO2  --  28 26  GLUCOSE 112* 87 177*  BUN '16 14 12  '$ CREATININE 1.10 1.12 1.18  CALCIUM  --  8.8* 8.4*   Estimated Creatinine Clearance: 67.2 mL/min (by C-G  formula based on SCr of 1.18 mg/dL). Liver & Pancreas: Recent Labs  Lab 12/27/22 1413  AST 31  ALT 23  ALKPHOS 38  BILITOT 0.9  PROT 6.7  ALBUMIN 3.8   No results for input(s): "LIPASE", "AMYLASE" in the last 168 hours. No results for input(s): "AMMONIA" in the last 168 hours. Diabetic: No results for input(s): "HGBA1C" in the last 72 hours. No results for input(s): "GLUCAP" in the last 168 hours. Cardiac Enzymes: No results for input(s): "CKTOTAL", "CKMB", "CKMBINDEX", "TROPONINI" in the last 168 hours. No results for input(s): "PROBNP" in the last 8760 hours. Coagulation Profile: Recent Labs  Lab 12/27/22 1413 12/28/22 0350  INR 1.1 1.3*   Thyroid Function Tests: No results for input(s): "TSH", "T4TOTAL", "FREET4", "T3FREE", "THYROIDAB" in the last 72 hours. Lipid Profile: No results for input(s): "CHOL", "HDL", "LDLCALC", "TRIG", "CHOLHDL", "LDLDIRECT" in the last 72 hours. Anemia Panel: No results for input(s): "VITAMINB12", "FOLATE", "FERRITIN", "TIBC", "IRON", "RETICCTPCT" in the last 72 hours. Urine analysis:  Component Value Date/Time   COLORURINE YELLOW 10/30/2016 1232   APPEARANCEUR CLEAR 10/30/2016 1232   LABSPEC 1.024 10/30/2016 1232   PHURINE 6.0 10/30/2016 1232   GLUCOSEU NEGATIVE 10/30/2016 1232   HGBUR NEGATIVE 10/30/2016 1232   BILIRUBINUR NEGATIVE 10/30/2016 1232   KETONESUR NEGATIVE 10/30/2016 1232   PROTEINUR NEGATIVE 10/30/2016 1232   NITRITE NEGATIVE 10/30/2016 1232   LEUKOCYTESUR NEGATIVE 10/30/2016 1232   Sepsis Labs: Invalid input(s): "PROCALCITONIN", "LACTICIDVEN"  Microbiology: Recent Results (from the past 240 hour(s))  Surgical pcr screen     Status: None   Collection Time: 12/27/22  6:06 PM   Specimen: Nasal Mucosa; Nasal Swab  Result Value Ref Range Status   MRSA, PCR NEGATIVE NEGATIVE Final   Staphylococcus aureus NEGATIVE NEGATIVE Final    Comment: (NOTE) The Xpert SA Assay (FDA approved for NASAL specimens in patients  79 years of age and older), is one component of a comprehensive surveillance program. It is not intended to diagnose infection nor to guide or monitor treatment. Performed at [facility], [place] [address] [place], [place] [address]     Radiology Studies: VAS [place] UPPER EXTREMITY ARTERIAL DUPLEX  Result Date: 12/28/2022  UPPER EXTREMITY DUPLEX STUDY Patient Name:  JARICK HARKINS [facility]  Date of Exam:   12/27/2022 Medical Rec #: [id number]          Accession #:    [id number] Date of Birth: 11-04-1946          Patient Gender: M Patient Age:   30 years Exam Location:  [facility] Procedure:      VAS [place] UPPER EXTREMITY ARTERIAL DUPLEX Referring Phys: COOPER ROBBINS --------------------------------------------------------------------------------  History: Patient has a history of upper extremity pain and tingling of fingers.          On Coumadin status post mitral valve replacement, however, stopped          Coumadin for procedure for approximately 6 days, restarted today.  Comparison Study: No prior study on file Performing Technologist: Sharion Dove RVS  Examination Guidelines: A complete evaluation includes B-mode imaging, spectral Doppler, color Doppler, and power Doppler as needed of all accessible portions of each vessel. Bilateral testing is considered an integral part of a complete examination. Limited examinations for reoccurring indications may be performed as noted.  Right Doppler Findings: +--------------+----------+---------+--------+--------+ Site          PSV (cm/s)Waveform StenosisComments +--------------+----------+---------+--------+--------+ Subclavian Mid93        triphasic                 +--------------+----------+---------+--------+--------+ Axillary      72        triphasic                 +--------------+----------+---------+--------+--------+ Brachial Prox 44        triphasic                 +--------------+----------+---------+--------+--------+ Brachial  Mid  35        triphasic                 +--------------+----------+---------+--------+--------+ Brachial Dist 20        triphasic                 +--------------+----------+---------+--------+--------+ Radial Prox   12                                  +--------------+----------+---------+--------+--------+ Radial Mid  10                                  +--------------+----------+---------+--------+--------+ Radial Dist   14                                  +--------------+----------+---------+--------+--------+ Ulnar Prox    0                  occluded         +--------------+----------+---------+--------+--------+ Ulnar Mid     0                                   +--------------+----------+---------+--------+--------+ Ulnar Dist    0                                   +--------------+----------+---------+--------+--------+ Palmar Arch   11                                  +--------------+----------+---------+--------+--------+ Acute thrombus noted at the brachial bifurcation at the [facility]. No flow noted in ulnar artery. Severely dampened monophasic/near thumping waveform noted in the radial artery.   Summary:  Right: Obstruction noted in the Brachial bifurcation and ulnar        artery. Acute thrombus noted at the brachial bifurcation at        the [facility]. No flow noted in ulnar artery. Severely dampened        monophasic/near thumping waveform noted in the radial artery. *See table(s) above for measurements and observations. Electronically signed by Monica Martinez MD on 12/28/2022 at 11:33:42 AM.    Final       Latrice Storlie T. Albion  If 7PM-7AM, please contact night-coverage [email or web address] 12/28/2022, 2:28 PM

## 2022-12-28 NOTE — Progress Notes (Signed)
ANTICOAGULATION CONSULT NOTE - Follow Up Consult  Pharmacy Consult for heparin Indication:  AVR and thrombus at the right brachial artery bifurcation   Labs: Recent Labs    12/25/22 0624 12/27/22 1413 12/28/22 0350  HGB 15.0 14.2 12.8*  HCT 44.0 44.4 37.3*  PLT  --  167 160  LABPROT  --  14.3 16.1*  INR  --  1.1 1.3*  HEPARINUNFRC  --   --  >1.10*  CREATININE 1.10 1.12 1.18     Assessment: 76yo male supratherapeutic on heparin with initial dosing for VTE and AVR while Coumadin on hold peri-procedure; no infusion issues per RN but she notes pink-tinged urine, Hgb has dropped some; noted that pt rec'd large heparin boluses during vascular procedure which could still be affecting level.  Goal of Therapy:  Heparin level 0.3-0.7 units/ml   Plan:  Will hold heparin infusion x1h then decrease heparin infusion by 3-4 units/kg/hr to 1200 units/hr and check level in 6 hours.    Wynona Neat, PharmD, BCPS  12/28/2022,4:59 AM

## 2022-12-28 NOTE — Progress Notes (Addendum)
[facility] [place]  Subjective  - still numbness in the fingers, but better overall   Objective 125/67 97 97.6 F (36.4 C) (Oral) 18 92%  Intake/Output Summary (Last 24 hours) at 12/28/2022 2902 Last data filed at 12/28/2022 0553 Gross per 24 hour  Intake 1462.96 ml  Output 1265 ml  Net 197.96 ml    Palpable radial pulses, doppler brisk ulnar and palmer signals on the right UE  Dry dressing and ace wrap replaced Compartments soft Lungs non labored breathing   Assessment/Planning: POD # 1  Right brachial artery cutdown,  Embolectomy of the brachial artery, ulnar artery, radial artery  Good inflow to the right UE Ace wrap for compression Pending ECHO and restarting Coumadin  INR 1.3 Will pend Coumadin per Pharmacy and Lovenox bridge will be calculated Heparin reduced due to hematuria possible due to previous seed implant procedure. Will follow IM attendings recommendations.  Roxy Horseman 12/28/2022 7:18 AM --  VASCULAR STAFF ADDENDUM: I have independently interviewed and examined the patient. I agree with the above.  Right arm with palpable pulses.  Excellent perfusion.  Hematuria - discussed with Dr. Jeffie Pollock his Urologist, no contraindication for foley, please place.  May need irrigation if clotting occurs.  Back heparin down to 500u/hr, can turn off if necessary Continue ASA'81mg'$   Needs ECHO  Cassandria Santee, MD [facility] Phone Number: [phone] 12/28/2022 11:25 AM    Laboratory Lab Results: Recent Labs    12/27/22 1413 12/28/22 0350  WBC 8.1 6.1  HGB 14.2 12.8*  HCT 44.4 37.3*  PLT 167 160   BMET Recent Labs    12/27/22 1413 12/28/22 0350  NA 137 137  K 4.0 4.2  CL 102 106  CO2 28 26  GLUCOSE 87 177*  BUN 14 12  CREATININE 1.12 1.18  CALCIUM 8.8* 8.4*    COAG Lab Results  Component Value Date   INR 1.3 (H) 12/28/2022   INR 1.1 12/27/2022   INR 3.6 (A)  11/20/2022   No results found for: "PTT"

## 2022-12-28 NOTE — Progress Notes (Signed)
  Echocardiogram 2D Echocardiogram has been performed.  Kenneth Ryan 12/28/2022, 4:31 PM

## 2022-12-29 ENCOUNTER — Encounter: Payer: Self-pay | Admitting: Family Medicine

## 2022-12-29 DIAGNOSIS — I742 Embolism and thrombosis of arteries of the upper extremities: Secondary | ICD-10-CM | POA: Diagnosis not present

## 2022-12-29 DIAGNOSIS — R310 Gross hematuria: Secondary | ICD-10-CM | POA: Diagnosis not present

## 2022-12-29 DIAGNOSIS — C61 Malignant neoplasm of prostate: Secondary | ICD-10-CM | POA: Diagnosis not present

## 2022-12-29 DIAGNOSIS — Z954 Presence of other heart-valve replacement: Secondary | ICD-10-CM | POA: Diagnosis not present

## 2022-12-29 LAB — CBC
HCT: 36.1 % — ABNORMAL LOW (ref 39.0–52.0)
Hemoglobin: 12 g/dL — ABNORMAL LOW (ref 13.0–17.0)
MCH: 29.3 pg (ref 26.0–34.0)
MCHC: 33.2 g/dL (ref 30.0–36.0)
MCV: 88 fL (ref 80.0–100.0)
Platelets: 154 10*3/uL (ref 150–400)
RBC: 4.1 MIL/uL — ABNORMAL LOW (ref 4.22–5.81)
RDW: 13.6 % (ref 11.5–15.5)
WBC: 8 10*3/uL (ref 4.0–10.5)
nRBC: 0 % (ref 0.0–0.2)

## 2022-12-29 LAB — RENAL FUNCTION PANEL
Albumin: 3 g/dL — ABNORMAL LOW (ref 3.5–5.0)
Anion gap: 7 (ref 5–15)
BUN: 18 mg/dL (ref 8–23)
CO2: 28 mmol/L (ref 22–32)
Calcium: 8.9 mg/dL (ref 8.9–10.3)
Chloride: 105 mmol/L (ref 98–111)
Creatinine, Ser: 1.23 mg/dL (ref 0.61–1.24)
GFR, Estimated: >60 mL/min (ref >60)
Glucose, Bld: 110 mg/dL — ABNORMAL HIGH (ref 70–99)
Phosphorus: 3.1 mg/dL (ref 2.5–4.6)
Potassium: 4 mmol/L (ref 3.5–5.1)
Sodium: 140 mmol/L (ref 135–145)

## 2022-12-29 LAB — SURGICAL PATHOLOGY

## 2022-12-29 LAB — MAGNESIUM: Magnesium: 1.6 mg/dL — ABNORMAL LOW (ref 1.7–2.4)

## 2022-12-29 LAB — HEPARIN LEVEL (UNFRACTIONATED)
Heparin Unfractionated: <0.1 IU/mL — ABNORMAL LOW (ref 0.30–0.70)
Heparin Unfractionated: >1.1 IU/mL — ABNORMAL HIGH (ref 0.30–0.70)
Heparin Unfractionated: <0.1 IU/mL — ABNORMAL LOW (ref 0.30–0.70)

## 2022-12-29 MED ORDER — WARFARIN SODIUM 4 MG PO TABS
4.0000 mg | ORAL_TABLET | Freq: Once | ORAL | Status: AC
  Administered 2022-12-29: 4 mg via ORAL
  Filled 2022-12-29: qty 1

## 2022-12-29 MED ORDER — WARFARIN - PHARMACIST DOSING INPATIENT: Freq: Every day | Status: DC

## 2022-12-29 MED ORDER — MAGNESIUM SULFATE 2 GM/50ML IV SOLN
2.0000 g | Freq: Once | INTRAVENOUS | Status: AC
  Administered 2022-12-29: 2 g via INTRAVENOUS
  Filled 2022-12-29: qty 50

## 2022-12-29 NOTE — Progress Notes (Signed)
PROGRESS NOTE  Kenneth Ryan QZR:[id number] DOB: 1945-12-30   PCP: Tammi Sou, MD  Patient is from: Home.  DOA: 12/27/2022 LOS: 2  Chief complaints Right arm numb and cold.  Brief Narrative / Interim history: 77 year old M with PMH of nonobstructive CAD, NICM with EF of 55 to 60% in 04/2022, severe aortic insufficiency s/p mechanical AVR and Bentall aortic replacement in 2017 on Coumadin, mitral valve repair, HLD, GERD, Barrett's esophagus and recent diagnosis of prostate cancer s/p brachytherapy seed on 12/25/2022 presented to ED due to right arm being cold and numb.  Patient's warfarin was held for recent prostate cancer treatment on 12/25/2022.  INR subtherapeutic.  Upper extremity duplex showed obstructing thrombus at right brachial bifurcation and ulnar artery. Patient was admitted from right upper extremity acute limb ischemia.  Started on IV heparin, and underwent embolectomy of brachial, ulnar and radial artery by vascular surgery on 12/27/2022.  TTE without acute finding.  Hospital course notable for gross hematuria that has resolved now.  H&H relatively stable.  Pharmacy titrating his heparin to therapeutic dose before switching to Lovenox.  Warfarin restarted.  Likely discharge home on warfarin with Lovenox bridge.  He has an appointment at INR clinic on 1/12.  Subjective: Seen and examined earlier this morning.  No major events overnight of this morning.  Denies pain in his right arm.  Numbness in his right arm resolving.  No further hematuria.  Objective: Vitals:   12/27/22 1815 12/29/22 0356 12/29/22 0807 12/29/22 1255  BP:  (!) 199/66 124/68 (!) 105/57  Pulse:  64 72 72  Resp:  '18 20 17  '$ Temp:  98.7 F (37.1 C) 97.9 F (36.6 C) 98.2 F (36.8 C)  TempSrc:  Oral Oral Oral  SpO2:  98% 94% 93%  Weight: 99.8 kg 102.5 kg    Height: 6' 0.01" (1.829 m)       Examination:  GENERAL: No apparent distress.  Nontoxic. HEENT: MMM.  Vision and hearing grossly intact.   NECK: Supple.  No apparent JVD.  RESP:  No IWOB.  Fair aeration bilaterally. CVS:  RRR. Heart sounds normal.  ABD/GI/GU: BS+. Abd soft, NTND.  MSK/EXT:  Moves extremities. No apparent deformity. No edema.  2+ right radial pulse. SKIN: Surgical incision on right forearm DCI. NEURO: Awake and alert. Oriented appropriately.  No apparent focal neuro deficit. PSYCH: Calm. Normal affect.   Procedures:  1/7-embolectomy of brachial artery, ulnar artery and radial artery by Dr. Virl Cagey.  Microbiology summarized: MRSA PCR screen negative.  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  INR was subtherapeutic. -S/p embolectomy of brachial artery, ulnar artery and radial artery by Dr. Unk Lightning on 1/7. -Resume warfarin with heparin bridge.  Titrating heparin to therapeutic dose before transitioning to Lovenox -Likely discharge on warfarin with Lovenox bridge on 1/10 -Continue low-dose aspirin.  History of severe aortic insufficiency s/p mechanical aortic valve and Bentall aortic root replacement in 10/2016.  INR 1.3.  No acute finding on TTE. -Anticoagulation as  above. -Outpatient follow-up in INR clinic on 1/12 as previously planned.  Ischemic cardiomyopathy with recovered EF: Takes Lasix as needed at home.  Appears euvolemic.   -Continue home Lasix as needed  Prostate cancer s/p radioactive seed implantation and cystoscopy by Dr. Jeffie Pollock on 1/5. Gross hematuria: Resolved.  Urine  clear.  Voiding without difficulty. -Urology signed off. -Strict intake and output  Essential hypertension: Normotensive. -Continue home meds  Hypomagnesemia -Monitor replenish as appropriate  Obesity Body mass index is 30.63 kg/m.           DVT prophylaxis:  SCD's Start: 12/27/22 2016 warfarin (COUMADIN) tablet 4 mg   Code Status: Full code Family Communication: None at bedside Level of care: Med-Surg Status is: Inpatient Remains inpatient appropriate because: Acute right brachial artery thromboembolism and gross hematuria   Final disposition: Likely home on 1/10. Consultants:  Vascular surgery Urology  Sch Meds:  Scheduled Meds:  aspirin EC  81 mg Oral Daily   docusate sodium  100 mg Oral Daily   fenofibrate  160 mg Oral Daily   irbesartan  300 mg Oral Daily   metoprolol succinate  50 mg Oral Daily   pantoprazole  40 mg Oral Daily   warfarin  4 mg Oral ONCE-1600   Warfarin - Pharmacist Dosing Inpatient   Does not apply q1600   Continuous Infusions:  sodium chloride     sodium chloride Stopped (12/29/22 0748)   heparin 800 Units/hr (12/29/22 1226)   magnesium sulfate bolus IVPB     PRN Meds:.sodium chloride, acetaminophen **OR** acetaminophen, alum & mag hydroxide-simeth, bisacodyl, furosemide, guaiFENesin-dextromethorphan, hydrALAZINE, labetalol, metoprolol tartrate, morphine injection, ondansetron, oxyCODONE, phenol, polyethylene glycol, potassium chloride  Antimicrobials: Anti-infectives (From admission, onward)    Start     Dose/Rate Route Frequency Ordered Stop   12/28/22 0500  ceFAZolin (ANCEF) IVPB 2g/100 mL premix        2 g 200 mL/hr over 30 Minutes Intravenous Every 8 hours 12/27/22 2015 12/28/22 1629        I have personally reviewed the following labs and images: CBC: Recent Labs  Lab 12/25/22 0624 12/27/22 1413 12/28/22 0350 12/29/22 0612  WBC  --  8.1 6.1 8.0  NEUTROABS  --  4.5  --   --   HGB 15.0 14.2 12.8* 12.0*  HCT 44.0  44.4 37.3* 36.1*  MCV  --  91.0 86.5 88.0  PLT  --  167 160 154   BMP &GFR Recent Labs  Lab 12/25/22 0624 12/27/22 1413 12/28/22 0350 12/29/22 0612  NA 143 137 137 140  K 4.2 4.0 4.2 4.0  CL 108 102 106 105  CO2  --  '28 26 28  '$ GLUCOSE 112* 87 177* 110*  BUN '16 14 12 18  '$ CREATININE 1.10 1.12 1.18 1.23  CALCIUM  --  8.8* 8.4* 8.9  MG  --   --   --  1.6*  PHOS  --   --   --  3.1   Estimated Creatinine Clearance: 65.3 mL/min (by C-G formula based on SCr of 1.23 mg/dL). Liver & Pancreas: Recent Labs  Lab 12/27/22 1413 12/29/22 0612  AST 31  --   ALT 23  --   ALKPHOS 38  --   BILITOT 0.9  --   PROT 6.7  --   ALBUMIN 3.8 3.0*   No results for input(s): "LIPASE", "AMYLASE" in the last 168 hours. No results for input(s): "AMMONIA" in the last 168 hours. Diabetic: No results for input(s): "HGBA1C" in  the last 72 hours. No results for input(s): "GLUCAP" in the last 168 hours. Cardiac Enzymes: No results for input(s): "CKTOTAL", "CKMB", "CKMBINDEX", "TROPONINI" in the last 168 hours. No results for input(s): "PROBNP" in the last 8760 hours. Coagulation Profile: Recent Labs  Lab 12/27/22 1413 12/28/22 0350  INR 1.1 1.3*   Thyroid Function Tests: No results for input(s): "TSH", "T4TOTAL", "FREET4", "T3FREE", "THYROIDAB" in the last 72 hours. Lipid Profile: No results for input(s): "CHOL", "HDL", "LDLCALC", "TRIG", "CHOLHDL", "LDLDIRECT" in the last 72 hours. Anemia Panel: No results for input(s): "VITAMINB12", "FOLATE", "FERRITIN", "TIBC", "IRON", "RETICCTPCT" in the last 72 hours. Urine analysis:    Component Value Date/Time   COLORURINE YELLOW 10/30/2016 [place] [place] 10/30/2016 1232   LABSPEC 1.024 10/30/2016 1232   PHURINE 6.0 10/30/2016 1232   GLUCOSEU NEGATIVE 10/30/2016 1232   HGBUR NEGATIVE 10/30/2016 1232   BILIRUBINUR NEGATIVE 10/30/2016 1232   KETONESUR NEGATIVE 10/30/2016 1232   PROTEINUR NEGATIVE 10/30/2016 1232   NITRITE NEGATIVE  10/30/2016 1232   LEUKOCYTESUR NEGATIVE 10/30/2016 1232   Sepsis Labs: Invalid input(s): "PROCALCITONIN", "LACTICIDVEN"  Microbiology: Recent Results (from the past 240 hour(s))  Surgical pcr screen     Status: None   Collection Time: 12/27/22  6:06 PM   Specimen: Nasal Mucosa; Nasal Swab  Result Value Ref Range Status   MRSA, PCR NEGATIVE NEGATIVE Final   Staphylococcus aureus NEGATIVE NEGATIVE Final    Comment: (NOTE) The Xpert SA Assay (FDA approved for NASAL specimens in patients 31 years of age and older), is one component of a comprehensive surveillance program. It is not intended to diagnose infection nor to guide or monitor treatment. Performed at [facility], [place] [address] [place], [place] [address]     Radiology Studies: ECHOCARDIOGRAM COMPLETE  Result Date: 12/28/2022    ECHOCARDIOGRAM REPORT   Patient Name:   Kenneth Ryan Date of Exam: 12/28/2022 Medical Rec #:  [id number]         Height:       72.0 in Accession #:    [id number]        Weight:       220.0 lb Date of Birth:  08-17-1946         BSA:          2.219 m Patient Age:    89 years          BP:           129/73 mmHg Patient Gender: M                 HR:           80 bpm. Exam Location:  Inpatient Procedure: 2D Echo Indications:    status post aortic and mitral valve replacement  History:        Patient has prior history of Echocardiogram examinations, most                 recent 05/07/2022. Cardiomyopathy, Bentall procedure; Risk                 Factors:Hypertension and Dyslipidemia.                 Aortic Valve: mechanical valve is present in the aortic                 position. Procedure Date: 11/03/16.                 Mitral Valve: prosthetic annuloplasty  ring valve is present in                 the mitral position. Procedure Date: 11/03/16.  Sonographer:    Johny Chess RDCS Referring Phys: [id number] FANG XU  Sonographer Comments: Suboptimal subcostal window. IMPRESSIONS  1. Left ventricular ejection  fraction, by estimation, is 55 to 60%. The left ventricle has normal function. The left ventricle has no regional wall motion abnormalities. Left ventricular diastolic parameters are consistent with Grade I diastolic dysfunction (impaired relaxation).  2. Right ventricular systolic function is normal. The right ventricular size is normal. There is normal pulmonary artery systolic pressure. The estimated right ventricular systolic pressure is 97.9 mmHg.  3. The mitral valve has been repaired/replaced. No evidence of mitral valve regurgitation. No evidence of mitral stenosis. The mean mitral valve gradient is 6.0 mmHg. There is a prosthetic annuloplasty ring present in the mitral position. Procedure Date: 11/03/16. Echo findings are consistent with normal structure and function of the mitral valve prosthesis.  4. The aortic valve has been repaired/replaced. Aortic valve regurgitation is not visualized. No aortic stenosis is present. There is a mechanical valve present in the aortic position. Procedure Date: 11/03/16. Echo findings are consistent with normal structure and function of the aortic valve prosthesis.  5. Aortic root/ascending aorta has been repaired/replaced.  6. The inferior vena cava is normal in size with greater than 50% respiratory variability, suggesting right atrial pressure of 3 mmHg. Comparison(s): No significant change from prior study. Prior images reviewed side by side. FINDINGS  Left Ventricle: Left ventricular ejection fraction, by estimation, is 55 to 60%. The left ventricle has normal function. The left ventricle has no regional wall motion abnormalities. The left ventricular internal cavity size was normal in size. There is  no left ventricular hypertrophy. Left ventricular diastolic parameters are consistent with Grade I diastolic dysfunction (impaired relaxation). Right Ventricle: The right ventricular size is normal. No increase in right ventricular wall thickness. Right ventricular  systolic function is normal. There is normal pulmonary artery systolic pressure. The tricuspid regurgitant velocity is 2.15 m/s, and  with an assumed right atrial pressure of 3 mmHg, the estimated right ventricular systolic pressure is 89.2 mmHg. Left Atrium: Left atrial size was normal in size. Right Atrium: Right atrial size was normal in size. Pericardium: There is no evidence of pericardial effusion. Mitral Valve: The mitral valve has been repaired/replaced. No evidence of mitral valve regurgitation. There is a prosthetic annuloplasty ring present in the mitral position. Procedure Date: 11/03/16. Echo findings are consistent with normal structure and  function of the mitral valve prosthesis. No evidence of mitral valve stenosis. MV peak gradient, 11.0 mmHg. The mean mitral valve gradient is 6.0 mmHg. Tricuspid Valve: The tricuspid valve is normal in structure. Tricuspid valve regurgitation is trivial. No evidence of tricuspid stenosis. Aortic Valve: The aortic valve has been repaired/replaced. Aortic valve regurgitation is not visualized. No aortic stenosis is present. Aortic valve mean gradient measures 8.0 mmHg. Aortic valve peak gradient measures 15.8 mmHg. There is a mechanical valve present in the aortic position. Procedure Date: 11/03/16. Echo findings are consistent with normal structure and function of the aortic valve prosthesis. Pulmonic Valve: The pulmonic valve was normal in structure. Pulmonic valve regurgitation is not visualized. No evidence of pulmonic stenosis. Aorta: The aortic root/ascending aorta has been repaired/replaced. Venous: The inferior vena cava is normal in size with greater than 50% respiratory variability, suggesting right atrial pressure of 3 mmHg. IAS/Shunts: No atrial level shunt  detected by color flow Doppler.  LEFT VENTRICLE PLAX 2D LVIDd:         5.30 cm LVIDs:         3.80 cm LV PW:         0.80 cm LV IVS:        0.90 cm  RIGHT VENTRICLE RV S prime:     13.50 cm/s TAPSE  (M-mode): 2.2 cm LEFT ATRIUM             Index        RIGHT ATRIUM           Index LA diam:        5.30 cm 2.39 cm/m   RA Area:     16.60 cm LA Vol (A2C):   57.9 ml 26.10 ml/m  RA Volume:   42.10 ml  18.98 ml/m LA Vol (A4C):   60.6 ml 27.31 ml/m LA Biplane Vol: 62.2 ml 28.04 ml/m  AORTIC VALVE AV Vmax:           199.00 cm/s AV Vmean:          129.000 cm/s AV VTI:            0.345 m AV Peak Grad:      15.8 mmHg AV Mean Grad:      8.0 mmHg LVOT Vmax:         153.00 cm/s LVOT Vmean:        99.050 cm/s LVOT VTI:          0.289 m LVOT/AV VTI ratio: 0.84  AORTA Ao Asc diam: 3.40 cm MITRAL VALVE                TRICUSPID VALVE MV Area (PHT): 2.66 cm     TR Peak grad:   18.5 mmHg MV Peak grad:  11.0 mmHg    TR Vmax:        215.00 cm/s MV Mean grad:  6.0 mmHg MV Vmax:       1.66 m/s     SHUNTS MV Vmean:      113.0 cm/s   Systemic VTI: 0.29 m MV Decel Time: 285 msec MV E velocity: 115.00 cm/s MV A velocity: 130.00 cm/s MV E/A ratio:  0.88 Candee Furbish MD Electronically signed by Candee Furbish MD Signature Date/Time: 12/28/2022/4:41:24 PM    Final       Daryle Boyington T. [place]  If 7PM-7AM, please contact night-coverage [email or web address] 12/29/2022, 2:53 PM

## 2022-12-29 NOTE — Progress Notes (Signed)
[place] for heparin and warfarin Indication:  s/p valve repair  No Known Allergies  Patient Measurements: Height: 6' 0.01" (182.9 cm) Weight: 102.5 kg (225 lb 14.4 oz) IBW/kg (Calculated) : 77.62 Heparin Dosing Weight: 97.9 kg  Vital Signs: Temp: 98.3 F (36.8 C) (01/09 2014) Temp Source: Oral (01/09 2014) BP: 113/61 (01/09 2014) Pulse Rate: 74 (01/09 2014)  Labs: Recent Labs    12/27/22 1413 12/28/22 0350 12/28/22 1615 12/29/22 0612 12/29/22 1848 12/29/22 2137  HGB 14.2 12.8*  --  12.0*  --   --   HCT 44.4 37.3*  --  36.1*  --   --   PLT 167 160  --  154  --   --   LABPROT 14.3 16.1*  --   --   --   --   INR 1.1 1.3*  --   --   --   --   HEPARINUNFRC  --  >1.10*   < > <0.10* >1.10* <0.10*  CREATININE 1.12 1.18  --  1.23  --   --    < > = values in this interval not displayed.     Estimated Creatinine Clearance: 65.3 mL/min (by C-G formula based on SCr of 1.23 mg/dL).   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  Right +brach,rad,ulnar-->occured while he was briefly off coumadin-->thrombectomy by vasc surg   Chronic hoarseness    secondary to hx of laryngeal surgery   GERD (gastroesophageal reflux disease)    Barrett's esoph on EGD 06/06/21   History of basal cell carcinoma    nose   History of laryngeal cancer 2002   Surgery and radiation completed same year 2002---  oncologist released pt summer 2014   (benign bx by dr [name] in 10/ 2016)   History of lower GI bleeding 05/2021   s/p   EGD w/some non bleeding gastritis, Colonoscopy with int/ext hemorrhoids, 1 polyp   Hyperlipidemia, mixed    Malignant neoplasm prostate ([place]) 07/2022   urologist-- wrenn/  radiation oncologist--- dr [name];  dx 08/ 2023,  Gleason 3+4    Nonischemic cardiomyopathy ([place]) 08/2016   followed by cardiologist-  dr [name];    per echo ef 40-45%  09/ 2017;   echo post surgery in 2017  ef 35%;  improved 2019 ;  last echo 05-07-2022  ef 55-60%   Pre-diabetes    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  Per CV surg, as of 02/16/17, his INR goal is 1.5-2.5.   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   Severe Mitral regurg;   s/p  Complex valvuloplasty including artificial Gore-tex neochord placement x6 and 28 mm Sorin Memo 3D ring annuloplasty   SOB (shortness of breath) on exertion    normal activities ok but more strenous acitivity sob which is baseline per cardiologist note   Wears glasses     Medications:  Scheduled:   aspirin EC  81 mg Oral Daily   docusate sodium  100 mg Oral Daily   fenofibrate  160 mg Oral Daily   irbesartan  300 mg Oral Daily   metoprolol succinate  50 mg Oral Daily   pantoprazole  40 mg Oral Daily   Warfarin - Pharmacist Dosing Inpatient   Does not apply q1600    Assessment: 5 yom on warfarin PTA for mitral heart valve who is post-op embolectomy of brachial, ulnar and radial artery.   Has been on fixed rate heparin infusion at 500 units/hr - of note, did have hematuria previously. Hgb 12, plt 154. Plan to restart warfarin and titrate upwards for heparin today with plans to discharge on enoxaparin bridge until therapeutic. INR yesterday is subtherapeutic at 1.3.   Heparin level came back >1.1 this PM again but it had to be drawn out of the same arm of the heparin infusion because of surgery to the other. Had a 1 minutes pause and re-collected. Came back undetectable. No bleeding issue per Rn. We will increase rate  tonight and draw law the same was as tonight.  PTA regimen is 2.5 mg daily except 5 mg Sun/Thurs. Last dose on 1/7.   Goal of Therapy:  INR 2-2.5 Heparin level: 0.3-0.7  Monitor platelets by anticoagulation protocol: Yes   Plan:  Will order warfarin 4 mg today prior to discharge Increase heparin infusion to 1000 units/hr (no bolus) Will order level in AM Monitor daily HL, CBC, and for s/sx of bleeding   Onnie Boer, PharmD, BCIDP, AAHIVP, CPP Infectious Disease Pharmacist 12/29/2022 10:11 PM

## 2022-12-29 NOTE — Progress Notes (Addendum)
[facility]  Subjective  - Doing well   Objective (!) 199/66 64 98.7 F (37.1 C) (Oral) 18 98%  Intake/Output Summary (Last 24 hours) at 12/29/2022 0757 Last data filed at 12/29/2022 0640 Gross per 24 hour  Intake 693.2 ml  Output 2200 ml  Net -1506.8 ml    Palpable radial pulse right UE Small dressing over incision, incision healing well He has intact sensation and motor   Assessment/Planning: POD # 2  Right brachial artery cutdown,  Embolectomy of the brachial artery, ulnar artery, radial artery Ace wrap removed, encourage mobility and elevation if edema returns.  Minimal edema this am. Foley was attempted, Dr. Carmelina Noun advised hold on foley placement.  The Urine color has improved.  HGB change 12.8 now 12.0.   Heparin rate 500 U/hr Continue ASA.  Pending anticoagulation plan per Attending at discharge Stable for discharge from a vascular point of view.   F/U 2-3 weeks for staple removal   Kenneth Ryan 12/29/2022 7:57 AM --  VASCULAR STAFF ADDENDUM: I have independently interviewed and examined the patient. I agree with the above.  Recommend restarting warfarin.  Attempt therapeutic heparin again - out of range at time of hematuria   Cassandria Santee, MD [facility] Phone Number: [phone] 12/29/2022 8:49 AM    Laboratory Lab Results: Recent Labs    12/28/22 0350 12/29/22 0612  WBC 6.1 8.0  HGB 12.8* 12.0*  HCT 37.3* 36.1*  PLT 160 154   BMET Recent Labs    12/28/22 0350 12/29/22 0612  NA 137 140  K 4.2 4.0  CL 106 105  CO2 26 28  GLUCOSE 177* 110*  BUN 12 18  CREATININE 1.18 1.23  CALCIUM 8.4* 8.9    COAG Lab Results  Component Value Date   INR 1.3 (H) 12/28/2022   INR 1.1 12/27/2022   INR 3.6 (A) 11/20/2022   No results found for: "PTT"

## 2022-12-29 NOTE — Progress Notes (Signed)
[name] for heparin and warfarin Indication:  s/p valve repair  No Known Allergies  Patient Measurements: Height: 6' 0.01" (182.9 cm) Weight: 102.5 kg (225 lb 14.4 oz) IBW/kg (Calculated) : 77.62 Heparin Dosing Weight: 97.9 kg  Vital Signs: Temp: 97.9 F (36.6 C) (01/09 0807) Temp Source: Oral (01/09 0807) BP: 124/68 (01/09 0807) Pulse Rate: 72 (01/09 0807)  Labs: Recent Labs    12/27/22 1413 12/28/22 0350 12/28/22 1615 12/29/22 0612  HGB 14.2 12.8*  --  12.0*  HCT 44.4 37.3*  --  36.1*  PLT 167 160  --  154  LABPROT 14.3 16.1*  --   --   INR 1.1 1.3*  --   --   HEPARINUNFRC  --  >1.10* <0.10* <0.10*  CREATININE 1.12 1.18  --  1.23    Estimated Creatinine Clearance: 65.3 mL/min (by C-G formula based on SCr of 1.23 mg/dL).   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   followed [facility]   Chronic hoarseness    secondary to hx of laryngeal surgery   GERD (gastroesophageal reflux disease)    Barrett's esoph on EGD 06/06/21   History of basal cell carcinoma    nose   History of laryngeal cancer 2002   Surgery and radiation completed same year 2002---  oncologist released pt summer 2014   (benign bx by dr [facility] in 10/ 2016)   History of lower GI bleeding 05/2021   s/p   EGD w/some non bleeding gastritis, Colonoscopy with int/ext hemorrhoids, 1 polyp   Hyperlipidemia, mixed    Malignant neoplasm prostate ([facility]) 07/2022   urologist-- wrenn/  radiation oncologist--- dr [facility];  dx 08/ 2023,  Gleason 3+4   Nonischemic cardiomyopathy ([place]) 08/2016   followed by cardiologist-  dr [facility];    per echo ef 40-45%  09/ 2017;   echo post surgery in 2017  ef 35%;  improved 2019 ;  last echo 05-07-2022  ef 55-60%   Pre-diabetes    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  Per CV surg, as of 02/16/17, his INR goal is 1.5-2.5.   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   Severe Mitral regurg;   s/p  Complex valvuloplasty including artificial Gore-tex neochord placement x6 and 28 mm Sorin Memo 3D ring annuloplasty   SOB (shortness of breath) on exertion    normal activities ok but more strenous acitivity sob which is baseline per cardiologist note   Wears glasses     Medications:  Scheduled:   aspirin EC  81 mg Oral Daily   docusate sodium  100 mg Oral Daily   fenofibrate  160 mg Oral Daily   irbesartan  300 mg Oral Daily   metoprolol succinate  50 mg Oral Daily   pantoprazole  40 mg Oral Daily    Assessment: 89 yom on warfarin PTA for mitral heart valve who is post-op embolectomy of brachial, ulnar and radial artery.   Has been on fixed rate heparin infusion at 500 units/hr - of note, did have hematuria previously. Hgb  12, plt 154. Plan to restart warfarin and titrate upwards for heparin today with plans to discharge on enoxaparin bridge until therapeutic. INR yesterday is subtherapeutic at 1.3.   PTA regimen is 2.5 mg daily except 5 mg Sun/Thurs. Last dose on 1/7.   Goal of Therapy:  INR 2-2.5 Heparin level: 0.3-0.7  Monitor platelets by anticoagulation protocol: Yes   Plan:  Will order warfarin 4 mg today prior to discharge Will increase heparin infusion to 800 units/hr (no bolus) Will order level in 8 hours Monitor daily HL, CBC, and for s/sx of bleeding   Antonietta Jewel, PharmD, BCCCP Clinical Pharmacist  Phone: [phone] 12/29/2022 11:20 AM  Please check AMION for all [place] phone numbers After 10:00 PM, call [place] [phone]

## 2022-12-29 NOTE — Progress Notes (Signed)
Mobility Specialist Progress Note:   12/29/22 1032  Mobility  Activity Ambulated with assistance in hallway  Level of Assistance Standby assist, set-up cues, supervision of patient - no hands on  Assistive Device None  Distance Ambulated (ft) 600 ft  Activity Response Tolerated well  $Mobility charge 1 Mobility   Pt in bed willing to participate in mobility. No complaints of pain. Left EOB with call bell in reach and all needs met.   Gareth Eagle Stacie Knutzen Mobility Specialist Please contact via [facility] [employer] or  Rehab Office at [phone]

## 2022-12-30 ENCOUNTER — Other Ambulatory Visit (HOSPITAL_COMMUNITY): Payer: Self-pay

## 2022-12-30 DIAGNOSIS — Z9889 Other specified postprocedural states: Secondary | ICD-10-CM | POA: Diagnosis not present

## 2022-12-30 DIAGNOSIS — Z954 Presence of other heart-valve replacement: Secondary | ICD-10-CM | POA: Diagnosis not present

## 2022-12-30 DIAGNOSIS — R791 Abnormal coagulation profile: Secondary | ICD-10-CM

## 2022-12-30 DIAGNOSIS — I742 Embolism and thrombosis of arteries of the upper extremities: Secondary | ICD-10-CM | POA: Diagnosis not present

## 2022-12-30 DIAGNOSIS — Z7901 Long term (current) use of anticoagulants: Secondary | ICD-10-CM | POA: Diagnosis not present

## 2022-12-30 DIAGNOSIS — E669 Obesity, unspecified: Secondary | ICD-10-CM

## 2022-12-30 LAB — CBC
HCT: 39.3 % (ref 39.0–52.0)
Hemoglobin: 12.8 g/dL — ABNORMAL LOW (ref 13.0–17.0)
MCH: 29.3 pg (ref 26.0–34.0)
MCHC: 32.6 g/dL (ref 30.0–36.0)
MCV: 89.9 fL (ref 80.0–100.0)
Platelets: 152 10*3/uL (ref 150–400)
RBC: 4.37 MIL/uL (ref 4.22–5.81)
RDW: 13.7 % (ref 11.5–15.5)
WBC: 7.2 10*3/uL (ref 4.0–10.5)
nRBC: 0 % (ref 0.0–0.2)

## 2022-12-30 LAB — RENAL FUNCTION PANEL
Albumin: 3.3 g/dL — ABNORMAL LOW (ref 3.5–5.0)
Anion gap: 8 (ref 5–15)
BUN: 17 mg/dL (ref 8–23)
CO2: 28 mmol/L (ref 22–32)
Calcium: 8.8 mg/dL — ABNORMAL LOW (ref 8.9–10.3)
Chloride: 102 mmol/L (ref 98–111)
Creatinine, Ser: 1.04 mg/dL (ref 0.61–1.24)
GFR, Estimated: >60 mL/min (ref >60)
Glucose, Bld: 107 mg/dL — ABNORMAL HIGH (ref 70–99)
Phosphorus: 3.1 mg/dL (ref 2.5–4.6)
Potassium: 4.3 mmol/L (ref 3.5–5.1)
Sodium: 138 mmol/L (ref 135–145)

## 2022-12-30 LAB — MAGNESIUM: Magnesium: 2 mg/dL (ref 1.7–2.4)

## 2022-12-30 LAB — PROTIME-INR
INR: 1.1 (ref 0.8–1.2)
Prothrombin Time: 14 seconds (ref 11.4–15.2)

## 2022-12-30 LAB — HEPARIN LEVEL (UNFRACTIONATED): Heparin Unfractionated: 0.22 IU/mL — ABNORMAL LOW (ref 0.30–0.70)

## 2022-12-30 MED ORDER — WARFARIN SODIUM 4 MG PO TABS: 4.0000 mg | ORAL_TABLET | Freq: Once | ORAL | Status: DC

## 2022-12-30 MED ORDER — ENOXAPARIN SODIUM 100 MG/ML IJ SOSY
100.0000 mg | PREFILLED_SYRINGE | Freq: Two times a day (BID) | INTRAMUSCULAR | Status: DC
  Administered 2022-12-30: 100 mg via SUBCUTANEOUS
  Filled 2022-12-30: qty 1

## 2022-12-30 MED ORDER — ENOXAPARIN SODIUM 100 MG/ML IJ SOSY
100.0000 mg | PREFILLED_SYRINGE | Freq: Two times a day (BID) | INTRAMUSCULAR | 0 refills | Status: DC
  Filled 2022-12-30: qty 14, 7d supply, fill #0

## 2022-12-30 MED ORDER — WARFARIN SODIUM 5 MG PO TABS: 5.0000 mg | ORAL_TABLET | Freq: Once | ORAL | Status: DC

## 2022-12-30 NOTE — TOC Transition Note (Signed)
Transition of Care (TOC) - CM/SW Discharge Note Marvetta Gibbons RN, BSN Transitions of Care Unit 4E- RN Case Manager See Treatment Team for direct phone #   Patient Details  Name: [facility] MRN: [id number] Date of Birth: 05-28-1946  Transition of Care [facility]) CM/SW Contact:  Dawayne Patricia, RN Phone Number: 12/30/2022, 11:01 AM   Clinical Narrative:    Pt stable for transition home today, Transition of Care Department [facility]) has reviewed patient and no TOC needs have been identified at this time.   Final next level of care: Home/Self Care Barriers to Discharge: No Barriers Identified   Patient Goals and CMS Choice   Choice offered to / list presented to : NA  Discharge Placement               Home          Discharge Plan and Services Additional resources added to the After Visit Summary for                  DME Arranged: N/A DME Agency: NA       HH Arranged: NA HH Agency: NA        Social Determinants of Health (SDOH) Interventions SDOH Screenings   Food Insecurity: No Food Insecurity (12/28/2022)  Housing: Low Risk  (12/28/2022)  Transportation Needs: No Transportation Needs (12/28/2022)  Utilities: Not At Risk (12/26/2022)  Alcohol Screen: Low Risk  (12/28/2022)  Depression (PHQ2-9): Low Risk  (11/09/2022)  Financial Resource Strain: Low Risk  (12/28/2022)  Physical Activity: Insufficiently Active (12/28/2022)  Social Connections: Moderately Integrated (12/28/2022)  Stress: No Stress Concern Present (12/28/2022)  Tobacco Use: Medium Risk (12/29/2022)     Readmission Risk Interventions    12/30/2022   11:01 AM  Readmission Risk Prevention Plan  Transportation Screening Complete  PCP or Specialist Appt within 5-7 Days Complete  Home Care Screening Complete  Medication Review (RN CM) Complete

## 2022-12-30 NOTE — Care Management Important Message (Signed)
Important Message  Patient Details  Name: [facility] MRN: [id number] Date of Birth: Jun 10, 1946   Medicare Important Message Given:  Yes     Shelda Altes 12/30/2022, 10:56 AM

## 2022-12-30 NOTE — Discharge Instructions (Signed)
Dry dressing to right UE incision until no drainage.  You may shower and wash you arm as needed.

## 2022-12-30 NOTE — Progress Notes (Addendum)
[place] for heparin and warfarin Indication:  s/p valve repair  No Known Allergies  Patient Measurements: Height: 6' 0.01" (182.9 cm) Weight: 102.5 kg (225 lb 14.4 oz) IBW/kg (Calculated) : 77.62 Heparin Dosing Weight: 97.9 kg  Vital Signs: Temp: 98.2 F (36.8 C) (01/10 0332) Temp Source: Oral (01/10 0332) BP: 128/72 (01/10 0332) Pulse Rate: 66 (01/10 0332)  Labs: Recent Labs    12/27/22 1413 12/28/22 0350 12/28/22 1615 12/29/22 0612 12/29/22 1848 12/29/22 2137 12/30/22 0603  HGB 14.2 12.8*  --  12.0*  --   --  12.8*  HCT 44.4 37.3*  --  36.1*  --   --  39.3  PLT 167 160  --  154  --   --  152  LABPROT 14.3 16.1*  --   --   --   --  14.0  INR 1.1 1.3*  --   --   --   --  1.1  HEPARINUNFRC  --  >1.10*   < > <0.10* >1.10* <0.10* 0.22*  CREATININE 1.12 1.18  --  1.23  --   --  1.04   < > = values in this interval not displayed.     Estimated Creatinine Clearance: 77.2 mL/min (by C-G formula based on SCr of 1.04 mg/dL).   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  Right +brach,rad,ulnar-->occured while he was briefly off coumadin-->thrombectomy by vasc surg   Chronic hoarseness    secondary to hx of laryngeal surgery   GERD (gastroesophageal reflux disease)    Barrett's esoph on EGD 06/06/21   History of basal cell carcinoma    nose   History of laryngeal cancer 2002   Surgery and radiation completed same year 2002---  oncologist released pt summer 2014   (benign bx by dr [name] in 10/ 2016)   History of lower GI bleeding 05/2021   s/p   EGD w/some non bleeding gastritis, Colonoscopy with int/ext hemorrhoids, 1 polyp   Hyperlipidemia, mixed    Malignant neoplasm prostate ([place]) 07/2022   urologist-- wrenn/  radiation  oncologist--- dr [name];  dx 08/ 2023,  Gleason 3+4   Nonischemic cardiomyopathy ([place]) 08/2016   followed by cardiologist-  dr [name];    per echo ef 40-45%  09/ 2017;   echo post surgery in 2017  ef 35%;  improved 2019 ;  last echo 05-07-2022  ef 55-60%   Pre-diabetes    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  Per CV surg, as of 02/16/17, his INR goal is 1.5-2.5.   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   Severe Mitral regurg;   s/p  Complex valvuloplasty including artificial Gore-tex neochord placement x6 and 28 mm Sorin Memo 3D ring annuloplasty   SOB (shortness of breath) on exertion    normal activities ok but more strenous acitivity sob which is baseline per cardiologist note   Wears glasses     Medications:  Scheduled:   aspirin EC  81 mg Oral Daily   docusate sodium  100 mg Oral Daily   fenofibrate  160 mg Oral Daily   irbesartan  300 mg Oral Daily   metoprolol succinate  50 mg Oral Daily   pantoprazole  40 mg Oral Daily   Warfarin - Pharmacist Dosing Inpatient   Does not apply q1600    Assessment: 53 yom on warfarin PTA for mitral heart valve who is post-op embolectomy of brachial, ulnar and radial artery.   Heparin was restarted 1/8 then decreased to 500 units/hr due to worsening hematuria, full dose heparin resumed 1/9. Warfarin restarted 1/9 and plans discharge on enoxaparin bridge  -INR= 1.1, Hg= 12.8  PTA regimen is 2.5 mg daily except 5 mg Sun/Thurs.  Goal of Therapy:  INR 2-2.5 Heparin level: 0.3-0.7  Monitor platelets by anticoagulation protocol: Yes   Plan:  Warfarin '5mg'$  today Increase heparin infusion to 1200 units/hr (no bolus) Heparin level in 8 hrs Monitor daily  HL, CBC, and for s/sx of bleeding   -If discharge today consider '5mg'$  warfarin tonight then continue home regimen -lovenox '100mg'$  ac q12h; would consider a 7 day supply  Hildred Laser, PharmD Clinical Pharmacist **Pharmacist phone directory can now be found on [email or web address] ([email or web address]).  Listed under [place].  Addendum -plans to change to lovenox  Plan -Lovenox '100mg'$  [place] q12h -stop heparin   Hildred Laser, PharmD Clinical Pharmacist **Pharmacist phone directory can now be found on [email or web address] ([email or web address]).  Listed under [place].

## 2022-12-30 NOTE — Discharge Summary (Signed)
Physician Discharge Summary  Kenneth Ryan:[id number] DOB: [birth year] DOA: 12/27/2022  PCP: Tammi Sou, MD  Admit date: 12/27/2022 Discharge date: 12/30/2022 Admitted From: Home Disposition: Home Recommendations for Outpatient Follow-up:  Follow up with PCP and vascular surgery as below Outpatient follow-up with urology as previously planned Follow-up at INR clinic on 01/01/2023 as previously planned Check CBC and CMP in about a week Please follow up on the following pending results: None  Home Health: Not indicated Equipment/Devices: Not indicated  Discharge Condition: Stable CODE STATUS: Full code  Follow-up Information     Broadus John, MD Follow up in 2 week(s).   Specialty: Vascular Surgery Why: Office will call you to arrange your appt (sent) Contact information: [place] [address] [phone]         Tammi Sou, MD. Schedule an appointment as soon as possible for a visit in 1 week(s).   Specialty: Family Medicine Contact information: [address] [place] [address] [phone]                 Hospital course 77 year old M with PMH of nonobstructive CAD, NICM with EF of 55 to 60% in 04/2022, severe aortic insufficiency s/p mechanical AVR and Bentall aortic replacement in 2017 on Coumadin, mitral valve repair, HLD, GERD, Barrett's esophagus and recent diagnosis of prostate cancer s/p brachytherapy seed on 12/25/2022 presented to ED due to right arm being cold and numb.  Patient's warfarin was held for recent prostate cancer treatment on 12/25/2022.  INR subtherapeutic.  Upper extremity duplex showed obstructing thrombus at right brachial bifurcation and ulnar artery. Patient was admitted from right upper extremity acute limb ischemia.  Started on IV heparin, and underwent embolectomy of brachial, ulnar and radial artery by vascular surgery on 12/27/2022.  TTE without acute finding.   Hospital course notable for  gross hematuria that has resolved. H&H remained stable.  Warfarin resumed. Right arm/thumb numbness resolved.     Patient is discharged on warfarin with Lovenox bridge for 7 days.  Per pharmacy recommendation, patient to take 5 mg tonight followed by home warfarin regimen.  He has an appointment at INR clinic on 01/01/2023.  Outpatient follow-up as above.  See individual problem list below for more.   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  Nontoxic. HEENT: MMM.  Vision and hearing grossly intact.  NECK: Supple.  No apparent JVD.  RESP:  No IWOB.  Fair aeration bilaterally. CVS:  RRR. Heart sounds normal.  ABD/GI/GU: BS+. Abd soft,  NTND.  MSK/EXT:  Moves extremities. No apparent deformity. No edema.  SKIN: Skin bruise on right forearm.  No hematoma. NEURO: Awake and alert. Oriented appropriately.  No apparent focal neuro deficit. PSYCH: Calm. Normal affect.   Discharge Instructions Discharge Instructions     Call MD for:  difficulty breathing, headache or visual disturbances   Complete by: As directed    Call MD for:  extreme fatigue   Complete by: As directed    Call MD for:  persistant dizziness or light-headedness   Complete by: As directed     Call MD for:  redness, tenderness, or signs of infection (pain, swelling, redness, odor or green/yellow discharge around incision site)   Complete by: As directed    Call MD for:  severe uncontrolled pain   Complete by: As directed    Diet - low sodium heart healthy   Complete by: As directed    Discharge instructions   Complete by: As directed    It has been a pleasure taking care of you!  You were hospitalized due to thromboembolism in your right arm artery for which you have been treated surgically medically.  We are discharging you on your warfarin with Lovenox bridge.  Take 5 mg of warfarin tonight.  Then, you can continue you home warfarin regimen.  Use Lovenox injection as prescribed.  Go to your INR clinic as previously scheduled on 01/01/2023.  Follow-up with vascular surgery and urology per their recommendation. Please review your new medication list and the directions on your medications before you take them.   Take care,   Increase activity slowly   Complete by: As directed       Allergies as of 12/30/2022   No Known Allergies      Medication List     TAKE these medications    acetaminophen 500 MG tablet Commonly known as: TYLENOL Take 500-1,000 mg by mouth every 6 (six) hours as needed for mild pain (depends on pain level if takes 500 mg or 1000 mg).   aspirin EC 81 MG tablet Take 1 tablet (81 mg total) by mouth daily.   enoxaparin 100 MG/ML injection Commonly known as: LOVENOX Inject 1 mL (100 mg total) into the skin every 12 (twelve) hours for 7 days.   fenofibrate 145 MG tablet Commonly known as: TRICOR TAKE 1 TABLET BY MOUTH DAILY What changed:  how much to take how to take this when to take this   furosemide 20 MG tablet Commonly known as: LASIX Take 1 tablet (20 mg total) by mouth daily as needed for fluid or edema (2 or more pounds).   HYDROcodone-acetaminophen 5-325 MG tablet Commonly known as: NORCO/VICODIN Take 2 tablets by mouth every 6  (six) hours as needed for moderate pain.   irbesartan 300 MG tablet Commonly known as: AVAPRO TAKE 1 TABLET BY MOUTH EVERY DAY   metoprolol succinate 50 MG 24 hr tablet Commonly known as: TOPROL-XL TAKE 1 TABLET BY MOUTH EVERY DAY   MULTIVITAMIN PO Take 1 tablet by mouth daily.   pantoprazole 40 MG tablet Commonly known as: PROTONIX TAKE 1 TABLET BY MOUTH EVERY DAY   warfarin 5 MG tablet Commonly known as: COUMADIN Take as directed. If you are unsure how to take this medication, talk to your nurse or doctor. Original instructions: Take 1/2-1 tablet daily as directed by the coumadin clinic. What changed:  how much to take how to take this when to take this additional instructions  Consultations: Vascular surgery Urology  Procedures/Studies: 1/7-embolectomy of brachial artery, ulnar artery and radial artery by Dr. Virl Cagey.    ECHOCARDIOGRAM COMPLETE  Result Date: 12/28/2022    ECHOCARDIOGRAM REPORT   Patient Name:   Kenneth Ryan [facility] Date of Exam: 12/28/2022 Medical Rec #:  [id number]         Height:       72.0 in Accession #:    [id number]        Weight:       220.0 lb Date of Birth:  September 02, 1946         BSA:          2.219 m Patient Age:    77 years          BP:           129/73 mmHg Patient Gender: M                 HR:           80 bpm. Exam Location:  Inpatient Procedure: 2D Echo Indications:    status post aortic and mitral valve replacement  History:        Patient has prior history of Echocardiogram examinations, most                 recent 05/07/2022. Cardiomyopathy, Bentall procedure; Risk                 Factors:Hypertension and Dyslipidemia.                 Aortic Valve: mechanical valve is present in the aortic                 position. Procedure Date: 11/03/16.                 Mitral Valve: prosthetic annuloplasty ring valve is present in                 the mitral position. Procedure Date: 11/03/16.  Sonographer:    Johny Chess RDCS Referring Phys: [id number]  FANG XU  Sonographer Comments: Suboptimal subcostal window. IMPRESSIONS  1. Left ventricular ejection fraction, by estimation, is 55 to 60%. The left ventricle has normal function. The left ventricle has no regional wall motion abnormalities. Left ventricular diastolic parameters are consistent with Grade I diastolic dysfunction (impaired relaxation).  2. Right ventricular systolic function is normal. The right ventricular size is normal. There is normal pulmonary artery systolic pressure. The estimated right ventricular systolic pressure is 32.3 mmHg.  3. The mitral valve has been repaired/replaced. No evidence of mitral valve regurgitation. No evidence of mitral stenosis. The mean mitral valve gradient is 6.0 mmHg. There is a prosthetic annuloplasty ring present in the mitral position. Procedure Date: 11/03/16. Echo findings are consistent with normal structure and function of the mitral valve prosthesis.  4. The aortic valve has been repaired/replaced. Aortic valve regurgitation is not visualized. No aortic stenosis is present. There is a mechanical valve present in the aortic position. Procedure Date: 11/03/16. Echo findings are consistent with normal structure and function of the aortic valve prosthesis.  5. Aortic root/ascending aorta has been repaired/replaced.  6. The inferior vena cava is normal in size with greater than 50% respiratory variability, suggesting right atrial pressure of 3 mmHg. Comparison(s): No significant change from prior study. Prior images reviewed side by side. FINDINGS  Left Ventricle: Left ventricular ejection fraction, by estimation, is 55 to 60%. The left ventricle has normal function. The  left ventricle has no regional wall motion abnormalities. The left ventricular internal cavity size was normal in size. There is  no left ventricular hypertrophy. Left ventricular diastolic parameters are consistent with Grade I diastolic dysfunction (impaired relaxation). Right Ventricle: The  right ventricular size is normal. No increase in right ventricular wall thickness. Right ventricular systolic function is normal. There is normal pulmonary artery systolic pressure. The tricuspid regurgitant velocity is 2.15 m/s, and  with an assumed right atrial pressure of 3 mmHg, the estimated right ventricular systolic pressure is 30.1 mmHg. Left Atrium: Left atrial size was normal in size. Right Atrium: Right atrial size was normal in size. Pericardium: There is no evidence of pericardial effusion. Mitral Valve: The mitral valve has been repaired/replaced. No evidence of mitral valve regurgitation. There is a prosthetic annuloplasty ring present in the mitral position. Procedure Date: 11/03/16. Echo findings are consistent with normal structure and  function of the mitral valve prosthesis. No evidence of mitral valve stenosis. MV peak gradient, 11.0 mmHg. The mean mitral valve gradient is 6.0 mmHg. Tricuspid Valve: The tricuspid valve is normal in structure. Tricuspid valve regurgitation is trivial. No evidence of tricuspid stenosis. Aortic Valve: The aortic valve has been repaired/replaced. Aortic valve regurgitation is not visualized. No aortic stenosis is present. Aortic valve mean gradient measures 8.0 mmHg. Aortic valve peak gradient measures 15.8 mmHg. There is a mechanical valve present in the aortic position. Procedure Date: 11/03/16. Echo findings are consistent with normal structure and function of the aortic valve prosthesis. Pulmonic Valve: The pulmonic valve was normal in structure. Pulmonic valve regurgitation is not visualized. No evidence of pulmonic stenosis. Aorta: The aortic root/ascending aorta has been repaired/replaced. Venous: The inferior vena cava is normal in size with greater than 50% respiratory variability, suggesting right atrial pressure of 3 mmHg. IAS/Shunts: No atrial level shunt detected by color flow Doppler.  LEFT VENTRICLE PLAX 2D LVIDd:         5.30 cm LVIDs:         3.80  cm LV PW:         0.80 cm LV IVS:        0.90 cm  RIGHT VENTRICLE RV S prime:     13.50 cm/s TAPSE (M-mode): 2.2 cm LEFT ATRIUM             Index        RIGHT ATRIUM           Index LA diam:        5.30 cm 2.39 cm/m   RA Area:     16.60 cm LA Vol (A2C):   57.9 ml 26.10 ml/m  RA Volume:   42.10 ml  18.98 ml/m LA Vol (A4C):   60.6 ml 27.31 ml/m LA Biplane Vol: 62.2 ml 28.04 ml/m  AORTIC VALVE AV Vmax:           199.00 cm/s AV Vmean:          129.000 cm/s AV VTI:            0.345 m AV Peak Grad:      15.8 mmHg AV Mean Grad:      8.0 mmHg LVOT Vmax:         153.00 cm/s LVOT Vmean:        99.050 cm/s LVOT VTI:          0.289 m LVOT/AV VTI ratio: 0.84  AORTA Ao Asc diam: 3.40 cm MITRAL VALVE  TRICUSPID VALVE MV Area (PHT): 2.66 cm     TR Peak grad:   18.5 mmHg MV Peak grad:  11.0 mmHg    TR Vmax:        215.00 cm/s MV Mean grad:  6.0 mmHg MV Vmax:       1.66 m/s     SHUNTS MV Vmean:      113.0 cm/s   Systemic VTI: 0.29 m MV Decel Time: 285 msec MV E velocity: 115.00 cm/s MV A velocity: 130.00 cm/s MV E/A ratio:  0.88 Candee Furbish MD Electronically signed by Candee Furbish MD Signature Date/Time: 12/28/2022/4:41:24 PM    Final    VAS [place] UPPER EXTREMITY ARTERIAL DUPLEX  Result Date: 12/28/2022  UPPER EXTREMITY DUPLEX STUDY Patient Name:  Kenneth Ryan  Date of Exam:   12/27/2022 Medical Rec #: [id number]          Accession #:    [id number] Date of Birth: August 15, 1946          Patient Gender: M Patient Age:   28 years Exam Location:  [facility] Procedure:      VAS [place] UPPER EXTREMITY ARTERIAL DUPLEX Referring Phys: COOPER ROBBINS --------------------------------------------------------------------------------  History: Patient has a history of upper extremity pain and tingling of fingers.          On Coumadin status post mitral valve replacement, however, stopped          Coumadin for procedure for approximately 6 days, restarted today.  Comparison Study: No prior study on file Performing  Technologist: Sharion Dove RVS  Examination Guidelines: A complete evaluation includes B-mode imaging, spectral Doppler, color Doppler, and power Doppler as needed of all accessible portions of each vessel. Bilateral testing is considered an integral part of a complete examination. Limited examinations for reoccurring indications may be performed as noted.  Right Doppler Findings: +--------------+----------+---------+--------+--------+ Site          PSV (cm/s)Waveform StenosisComments +--------------+----------+---------+--------+--------+ Subclavian Mid93        triphasic                 +--------------+----------+---------+--------+--------+ Axillary      72        triphasic                 +--------------+----------+---------+--------+--------+ Brachial Prox 44        triphasic                 +--------------+----------+---------+--------+--------+ Brachial Mid  35        triphasic                 +--------------+----------+---------+--------+--------+ Brachial Dist 20        triphasic                 +--------------+----------+---------+--------+--------+ Radial Prox   12                                  +--------------+----------+---------+--------+--------+ Radial Mid    10                                  +--------------+----------+---------+--------+--------+ Radial Dist   14                                  +--------------+----------+---------+--------+--------+ Ulnar Prox  0                  occluded         +--------------+----------+---------+--------+--------+ Ulnar Mid     0                                   +--------------+----------+---------+--------+--------+ Ulnar Dist    0                                   +--------------+----------+---------+--------+--------+ Palmar Arch   11                                  +--------------+----------+---------+--------+--------+ Acute thrombus noted at the brachial bifurcation at  the [facility]. No flow noted in ulnar artery. Severely dampened monophasic/near thumping waveform noted in the radial artery.   Summary:  Right: Obstruction noted in the Brachial bifurcation and ulnar        artery. Acute thrombus noted at the brachial bifurcation at        the [facility]. No flow noted in ulnar artery. Severely dampened        monophasic/near thumping waveform noted in the radial artery. *See table(s) above for measurements and observations. Electronically signed by Monica Martinez MD on 12/28/2022 at 11:33:42 AM.    Final    DG C-Arm 1-60 Min-No Report  Result Date: 12/25/2022 Fluoroscopy was utilized by the requesting physician.  No radiographic interpretation.       The results of significant diagnostics from this hospitalization (including imaging, microbiology, ancillary and laboratory) are listed below for reference.     Microbiology: Recent Results (from the past 240 hour(s))  Surgical pcr screen     Status: None   Collection Time: 12/27/22  6:06 PM   Specimen: Nasal Mucosa; Nasal Swab  Result Value Ref Range Status   MRSA, PCR NEGATIVE NEGATIVE Final   Staphylococcus aureus NEGATIVE NEGATIVE Final    Comment: (NOTE) The Xpert SA Assay (FDA approved for NASAL specimens in patients 99 years of age and older), is one component of a comprehensive surveillance program. It is not intended to diagnose infection nor to guide or monitor treatment. Performed at [facility], [place] [address] [place], [place] [address]      Labs:  CBC: Recent Labs  Lab 12/25/22 [phone] 12/27/22 1413 12/28/22 0350 12/29/22 0612 12/30/22 0603  WBC  --  8.1 6.1 8.0 7.2  NEUTROABS  --  4.5  --   --   --   HGB 15.0 14.2 12.8* 12.0* 12.8*  HCT 44.0 44.4 37.3* 36.1* 39.3  MCV  --  91.0 86.5 88.0 89.9  PLT  --  167 160 154 152   BMP &GFR Recent Labs  Lab 12/25/22 0624 12/27/22 1413 12/28/22 0350 12/29/22 0612 12/30/22 0603  NA 143 137 137 140 138  K 4.2 4.0 4.2 4.0 4.3  CL 108 102  106 105 102  CO2  --  '28 26 28 28  '$ GLUCOSE 112* 87 177* 110* 107*  BUN '16 14 12 18 17  '$ CREATININE 1.10 1.12 1.18 1.23 1.04  CALCIUM  --  8.8* 8.4* 8.9 8.8*  MG  --   --   --  1.6* 2.0  PHOS  --   --   --  3.1 3.1   Estimated Creatinine Clearance: 77.2 mL/min (by C-G formula based on SCr of 1.04 mg/dL). Liver & Pancreas: Recent Labs  Lab 12/27/22 1413 12/29/22 0612 12/30/22 0603  AST 31  --   --   ALT 23  --   --   ALKPHOS 38  --   --   BILITOT 0.9  --   --   PROT 6.7  --   --   ALBUMIN 3.8 3.0* 3.3*   No results for input(s): "LIPASE", "AMYLASE" in the last 168 hours. No results for input(s): "AMMONIA" in the last 168 hours. Diabetic: No results for input(s): "HGBA1C" in the last 72 hours. No results for input(s): "GLUCAP" in the last 168 hours. Cardiac Enzymes: No results for input(s): "CKTOTAL", "CKMB", "CKMBINDEX", "TROPONINI" in the last 168 hours. No results for input(s): "PROBNP" in the last 8760 hours. Coagulation Profile: Recent Labs  Lab 12/27/22 1413 12/28/22 0350 12/30/22 0603  INR 1.1 1.3* 1.1   Thyroid Function Tests: No results for input(s): "TSH", "T4TOTAL", "FREET4", "T3FREE", "THYROIDAB" in the last 72 hours. Lipid Profile: No results for input(s): "CHOL", "HDL", "LDLCALC", "TRIG", "CHOLHDL", "LDLDIRECT" in the last 72 hours. Anemia Panel: No results for input(s): "VITAMINB12", "FOLATE", "FERRITIN", "TIBC", "IRON", "RETICCTPCT" in the last 72 hours. Urine analysis:    Component Value Date/Time   COLORURINE YELLOW 10/30/2016 [place] 10/30/2016 1232   LABSPEC 1.024 10/30/2016 1232   PHURINE 6.0 10/30/2016 1232   GLUCOSEU NEGATIVE 10/30/2016 1232   HGBUR NEGATIVE 10/30/2016 1232   BILIRUBINUR NEGATIVE 10/30/2016 1232   KETONESUR NEGATIVE 10/30/2016 1232   PROTEINUR NEGATIVE 10/30/2016 1232   NITRITE NEGATIVE 10/30/2016 1232   LEUKOCYTESUR NEGATIVE 10/30/2016 1232   Sepsis Labs: Invalid input(s): "PROCALCITONIN",  "LACTICIDVEN"   SIGNED:  Mercy Riding, MD  [facility] 12/30/2022, 3:26 PM

## 2022-12-30 NOTE — Progress Notes (Addendum)
[facility]  Subjective  - Doing well no new complaints   Objective 128/72 66 98 F (36.7 C) (Oral) 18 92%  Intake/Output Summary (Last 24 hours) at 12/30/2022 0810 Last data filed at 12/30/2022 0748 Gross per 24 hour  Intake 428.22 ml  Output 1525 ml  Net -1096.78 ml    Right AC incision healing well with increased ecchymosis.  Grip 5/5, easily palpable radial pulse.  Dry dressing replaced over incision. Lungs non labored breathing General no acute distress    Assessment/Planning: POD # 3 Right brachial artery cutdown,  Embolectomy of the brachial artery, ulnar artery, radial artery  Encouraged active range of motion and elevation as needed to assist with edema. Coumadin, Heparin transition to Lovenox bridge and plan for discharge home today per Attending notes Will arrange f/u in 2-3 weeks for staple removal and right UE arterial duplex,  dry dressing to incision until no drainage.  He may shower.  Continue ASA daily    Roxy Horseman 12/30/2022 8:10 AM --  Laboratory Lab Results: Recent Labs    12/29/22 0612 12/30/22 0603  WBC 8.0 7.2  HGB 12.0* 12.8*  HCT 36.1* 39.3  PLT 154 152   BMET Recent Labs    12/29/22 0612 12/30/22 0603  NA 140 138  K 4.0 4.3  CL 105 102  CO2 28 28  GLUCOSE 110* 107*  BUN 18 17  CREATININE 1.23 1.04  CALCIUM 8.9 8.8*    COAG Lab Results  Component Value Date   INR 1.1 12/30/2022   INR 1.3 (H) 12/28/2022   INR 1.1 12/27/2022   No results found for: "PTT"

## 2022-12-31 ENCOUNTER — Telehealth: Payer: Self-pay

## 2022-12-31 ENCOUNTER — Telehealth: Payer: Self-pay | Admitting: *Deleted

## 2022-12-31 NOTE — Progress Notes (Signed)
  Care Coordination  Note  12/31/2022 Name: Kenneth Ryan MRN: [id number] DOB: Mar 04, 1946  Kenneth Ryan is a 77 y.o. year old primary care patient of [name], [name], MD. I reached out to Dimas Alexandria by phone today to assist with scheduling a follow up appointment. Kenneth Ryan verbally consented to my assistance.       Follow up plan: Hospital Follow Up appointment scheduled with (Dr Anitra Lauth) on (01/04/2023) at (1120am).  Julian Hy, [place] Direct Dial: [phone]

## 2022-12-31 NOTE — Patient Outreach (Signed)
  Care Coordination TOC Note Transition Care Management Unsuccessful Follow-up Telephone Call  Date of discharge and from where:  12/30/22-  Attempts:  1st Attempt  Reason for unsuccessful TCM follow-up call:  Left voice message   [place] Management Telephonic Care Management Coordinator Direct Phone: [phone] Toll Free: [phone] Fax: [phone]

## 2022-12-31 NOTE — Progress Notes (Signed)
  Care Coordination  Note  12/31/2022 Name: Kenneth Ryan MRN: [id number] DOB: December 30, 1945  Kenneth Ryan is a 77 y.o. year old primary care patient of [name], [name], MD. I reached out to Dimas Alexandria by phone today to assist with scheduling a follow up appointment. [name] verbally consented to my assistance.       Follow up plan: Unsuccessful telephone outreach attempt made. A HIPAA compliant phone message was left for the patient providing contact information and requesting a return call.   Julian Hy, [place] Direct Dial: [phone]

## 2022-12-31 NOTE — Patient Outreach (Signed)
  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 Incoming call from patient returning RN CM call. He voices things going well. He rested well last night. He has ate breakfast and currently doing morning routine. He denies any pain or discomfort.  Any questions or concerns? Yes-patient wanting to know if he is supposed to get alcohol pads to clean area prior to injecting Lovenox injection. Discussed infection prevention and importance of cleaning area. Patient voiced understanding and will obtain some.   Items Reviewed: Did the pt receive and understand the discharge instructions provided? Yes  Medications obtained and verified? Yes  Other? No  Any new allergies since your discharge? No  Dietary orders reviewed? Yes Do you have support at home? Yes   Home Care and Equipment/Supplies: Were home health services ordered? not applicable If so, what is the name of the agency? N/A  Has the agency set up a time to come to the patient's home? not applicable Were any new equipment or medical supplies ordered?  No What is the name of the medical supply agency? N/A Were you able to get the supplies/equipment? not applicable Do you have any questions related to the use of the equipment or supplies? No  Functional Questionnaire: (I = Independent and D = Dependent) ADLs: I  Bathing/Dressing- I  Meal Prep- I  Eating- I  Maintaining continence- I  Transferring/Ambulation- I  Managing Meds- I  Follow up appointments reviewed:  PCP Hospital f/u appt confirmed? Patient agreeable to care guide calling to assist with scheduling. [facility] f/u appt confirmed? Yes  Scheduled to see Dr. Virl Cagey on 01/22/23 @ 10:30am. INR Clinic 01/01/23- 10:15 am. Are transportation arrangements  needed? No  If their condition worsens, is the pt aware to call PCP or go to the Emergency Dept.? Yes Was the patient provided with contact information for the PCP's office or ED? Yes Was to pt encouraged to call back with questions or concerns? Yes  SDOH assessments and interventions completed:   Yes SDOH Interventions Today    Flowsheet Row Most Recent Value  SDOH Interventions   Food Insecurity Interventions Intervention Not Indicated  Transportation Interventions Intervention Not Indicated       Care Coordination Interventions:  Education provided re; Lovenox injections    Encounter Outcome:  Pt. Visit Completed    Enzo Montgomery, RN,BSN,CCM [place] Management Telephonic Care Management Coordinator Direct Phone: [phone] Toll Free: [phone] Fax: [phone]

## 2023-01-01 ENCOUNTER — Ambulatory Visit (INDEPENDENT_AMBULATORY_CARE_PROVIDER_SITE_OTHER): Payer: PPO

## 2023-01-01 DIAGNOSIS — Z5181 Encounter for therapeutic drug level monitoring: Secondary | ICD-10-CM

## 2023-01-01 DIAGNOSIS — I7121 Aneurysm of the ascending aorta, without rupture: Secondary | ICD-10-CM

## 2023-01-01 DIAGNOSIS — Z9889 Other specified postprocedural states: Secondary | ICD-10-CM

## 2023-01-01 DIAGNOSIS — Z954 Presence of other heart-valve replacement: Secondary | ICD-10-CM | POA: Diagnosis not present

## 2023-01-01 LAB — POCT INR: INR: 1.5 — AB (ref 2.0–3.0)

## 2023-01-01 NOTE — Patient Instructions (Signed)
TAKE ANOTHER 0.5 TABLET TODAY  then continue taking warfarin 1/2 tablet daily except for 1 tablet on Sunday and Thursday. Recheck INR 1 week. Stay consistent with greens. Coumadin Clinic [phone];

## 2023-01-04 ENCOUNTER — Encounter: Payer: Self-pay | Admitting: Family Medicine

## 2023-01-04 ENCOUNTER — Ambulatory Visit (INDEPENDENT_AMBULATORY_CARE_PROVIDER_SITE_OTHER): Payer: PPO | Admitting: Family Medicine

## 2023-01-04 VITALS — BP 105/61 | HR 61 | Temp 98.1°F | Ht 72.01 in | Wt 228.6 lb

## 2023-01-04 DIAGNOSIS — Z86718 Personal history of other venous thrombosis and embolism: Secondary | ICD-10-CM

## 2023-01-04 DIAGNOSIS — C61 Malignant neoplasm of prostate: Secondary | ICD-10-CM

## 2023-01-04 DIAGNOSIS — Z7901 Long term (current) use of anticoagulants: Secondary | ICD-10-CM

## 2023-01-04 DIAGNOSIS — I749 Embolism and thrombosis of unspecified artery: Secondary | ICD-10-CM

## 2023-01-04 DIAGNOSIS — Z952 Presence of prosthetic heart valve: Secondary | ICD-10-CM | POA: Diagnosis not present

## 2023-01-04 NOTE — Progress Notes (Signed)
01/04/2023  CC:  Chief Complaint  Patient presents with   Hospitalization Follow-up    Patient is a 76 y.o. Caucasian male who presents for  hospital follow up, specifically Transitional Care Services face-to-face visit. Dates hospitalized: 1/7 to 12/30/2022. 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 Date of interactive (phone) contact with patient and/or caregiver:12/31/22  I have reviewed patient's discharge summary plus pertinent specific notes, labs, and imaging from the hospitalization.   Patient presented to the ED with symptoms of right arm ischemia.  He had been on Coumadin for anticoagulation for mechanical AVR but this was temporarily discontinued for recent prostate cancer treatment on 12/25/2022.  He underwent embolectomy of the brachial, ulnar, and radial artery by vascular surgery on 12/27/2022.  Transthoracic echocardiogram without acute findings.  He had an episode of gross hematuria in the hospital, H&H remained stable.  His warfarin was resumed and his right arm symptoms resolved.  He was discharged on warfarin with Lovenox bridge for 7 days he is followed by the Coumadin clinic.  CURRENTLY: He feels well, right arm with minimal discomfort around his incision but no problems with pallor, pain in wrist or hand, or weakness. Doing well with Lovenox bridge with Coumadin, next INR check is in 3 days.  Medication reconciliation was done today and patient is taking meds as recommended by discharging hospitalist/specialist: Tylenol, aspirin 81 mg daily, Lovenox 1 mL every 12 hours x 7 days, fenofibrate 145/day, furosemide 20 mg daily as needed, Vicodin every 6 hours as needed, irbesartan 300 mg daily, Toprol-XL 50 mg daily, pantoprazole 40 mg daily, warfarin 5 mg as directed,  ROS as above, plus--> no fevers, no CP, no SOB, no wheezing, no  cough, no dizziness, no HAs, no rashes, no melena/hematochezia.  No polyuria or polydipsia.  No myalgias or arthralgias.  No focal weakness, paresthesias, or tremors.  No acute vision or hearing abnormalities.  No dysuria or unusual/new urinary urgency or frequency.  No recent changes in lower legs. No n/v/d or abd pain.  No palpitations.    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  Right +brach,rad,ulnar-->occured while he was briefly off coumadin-->thrombectomy by vasc surg   Chronic hoarseness    secondary to hx of laryngeal surgery   GERD (gastroesophageal reflux disease)    Barrett's esoph on EGD 06/06/21   History of basal cell carcinoma    nose   History of laryngeal cancer 2002   Surgery and radiation completed same year 2002---  oncologist released pt summer 2014   (benign bx by dr [name] in 10/ 2016)   History of lower GI bleeding 05/2021   s/p   EGD w/some non bleeding gastritis, Colonoscopy with int/ext hemorrhoids, 1 polyp   Hyperlipidemia, mixed    Malignant neoplasm prostate ([place]) 07/2022   urologist-- wrenn/  radiation oncologist--- dr [name];  dx 08/ 2023,  Gleason 3+4   Nonischemic cardiomyopathy ([place]) 08/2016   followed by cardiologist-  dr [name];    per echo ef 40-45%  09/ 2017;   echo post surgery in 2017  ef 35%;  improved 2019 ;  last echo 05-07-2022  ef  55-60%   Pre-diabetes    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  Per CV surg, as of 02/16/17, his INR goal is 1.5-2.5.   S/P  mitral valve repair 11/03/2016   Severe Mitral regurg;   s/p  Complex valvuloplasty including artificial Gore-tex neochord placement x6 and 28 mm Sorin Memo 3D ring annuloplasty   SOB (shortness of breath) on exertion    normal activities ok but more strenous acitivity sob which is baseline per cardiologist note   Wears glasses     PSH:  Past Surgical History:  Procedure Laterality Date   BENTALL PROCEDURE N/A 11/03/2016   Procedure: BENTALL PROCEDURE AORTIC ROOT REPLACEMENT + mechanical AV;  Surgeon: Rexene Alberts, MD;  Location: [facility];  Service: Open Heart Surgery;  Laterality: N/A;   BIOPSY  06/05/2021   Procedure: BIOPSY;  Surgeon: Thornton Park, MD;  Location: [facility];  Service: Gastroenterology;;   CARDIAC CATHETERIZATION N/A 09/29/2016   Clean coronaries, EF 60-65%.  Procedure: Right/Left Heart Cath and Coronary Angiography;  Surgeon: Jettie Booze, MD;  Location: [facility];  Service: Cardiovascular;  Laterality: N/A;   CHOLECYSTECTOMY  1980   COLONOSCOPY WITH PROPOFOL N/A 06/05/2021   diverticulosis descending and sigmoid, int+ext nonbleeding hemorr, 1 sessile seratted polyp w/out cytologic dysplasia. Procedure: COLONOSCOPY WITH PROPOFOL;  Surgeon: Thornton Park, MD;  Location: [facility];  Service: Gastroenterology;  Laterality: N/A;   DIRECT LARYNGOSCOPY Left 10/17/2015   Procedure: MICRO DIRECT LARYNGOSCOPY WITH FROZEN SECTION;  Surgeon: Jodi Marble, MD;  Location: [facility];  Service: ENT;  Laterality: Left;   EMBOLECTOMY Right 12/27/2022   Procedure: RIGHT BRACHIAL, ULNAR, AND RADIAL ARTERY EMBOLECTOMY;  Surgeon: Broadus John, MD;  Location: [facility];  Service: Vascular;  Laterality: Right;   ESOPHAGOGASTRODUODENOSCOPY (EGD) WITH PROPOFOL N/A 06/05/2021   Barrett's esoph.  Gastritis (h pylori neg), multiple benign gastric polyps. Procedure: ESOPHAGOGASTRODUODENOSCOPY (EGD) WITH PROPOFOL;  Surgeon: Thornton Park, MD;  Location: [facility];   Service: Gastroenterology;  Laterality: N/A;   LARYNX SURGERY  2002   removal vocal cord tumor (cancer)   MITRAL VALVE REPAIR N/A 11/03/2016   Procedure: MITRAL VALVE REPAIR (MVR);  Surgeon: Rexene Alberts, MD;  Location: [facility];  Service: Open Heart Surgery;  Laterality: N/A;   PERIPHERAL VASCULAR CATHETERIZATION N/A 09/29/2016   Procedure: Abdominal Aortogram;  Surgeon: Jettie Booze, MD;  Location: [facility];  Service: Cardiovascular;  Laterality: N/A;   PERIPHERAL VASCULAR CATHETERIZATION N/A 09/29/2016   Procedure: Thoracic Aortogram;  Surgeon: Jettie Booze, MD;  Location: [facility];  Service: Cardiovascular;  Laterality: N/A;   POLYPECTOMY  06/05/2021   Procedure: POLYPECTOMY;  Surgeon: Thornton Park, MD;  Location: [facility];  Service: Gastroenterology;;   RADIOACTIVE SEED IMPLANT N/A 12/25/2022   Procedure: RADIOACTIVE SEED IMPLANT/BRACHYTHERAPY IMPLANT;  Surgeon: [name], MD;  [facility];  Service: Urology;  Laterality: N/A;   SPACE OAR INSTILLATION N/A 12/25/2022   Procedure: SPACE OAR INSTILLATION;  Surgeon: [name], MD;  Location: [facility];  Service: Urology;  Laterality: N/A;   TEE WITHOUT CARDIOVERSION N/A 09/17/2016   Procedure: TRANSESOPHAGEAL ECHOCARDIOGRAM (TEE);  Surgeon: Jerline Pain, MD;  Location: [facility];  Service: Cardiovascular;  Laterality: N/A;   TEE WITHOUT CARDIOVERSION N/A 11/03/2016   Procedure: TRANSESOPHAGEAL ECHOCARDIOGRAM (TEE);  Surgeon: Rexene Alberts, MD;  Location: [facility];  Service: Open Heart Surgery;  Laterality: N/A;   TONSILLECTOMY AND ADENOIDECTOMY     child   TRANSESOPHAGEAL ECHOCARDIOGRAM  09/17/2016   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  Mod/severe AR w/functional bicuspid AV.  EF 45%.    MEDS:  Outpatient Medications Prior to Visit  Medication Sig Dispense Refill   acetaminophen (TYLENOL) 500 MG tablet Take 500-1,000 mg by mouth every 6 (six) hours as  needed for mild pain (depends on pain level if takes 500 mg or 1000 mg).     aspirin EC 81 MG EC tablet Take 1 tablet (81 mg total) by mouth daily.     enoxaparin (LOVENOX) 100 MG/ML injection Inject 1 mL (100 mg total) into the skin every 12 (twelve) hours for 7 days. 14 mL 0   fenofibrate (TRICOR) 145 MG tablet TAKE 1 TABLET BY MOUTH DAILY (Patient taking differently: Take 145 mg by mouth daily. TAKE 1 TABLET BY MOUTH DAILY) 90 tablet 3   furosemide (LASIX) 20 MG tablet Take 1 tablet (20 mg total) by mouth daily as needed for fluid or edema (2 or more pounds). (Patient taking differently: Take 20 mg by mouth daily as needed for fluid or edema (2 or more pounds).) 30 tablet 1   HYDROcodone-acetaminophen (NORCO/VICODIN) 5-325 MG tablet Take 2 tablets by mouth every 6 (six) hours as needed for moderate pain. 6 tablet 0   irbesartan (AVAPRO) 300 MG tablet TAKE 1 TABLET BY MOUTH EVERY DAY (Patient taking differently: Take 300 mg by mouth daily.) 90 tablet 3   metoprolol succinate (TOPROL-XL) 50 MG 24 hr tablet TAKE 1 TABLET BY MOUTH EVERY DAY (Patient taking differently: Take 50 mg by mouth daily.) 90 tablet 3   Multiple Vitamins-Minerals (MULTIVITAMIN PO) Take 1 tablet by mouth daily.      pantoprazole (PROTONIX) 40 MG tablet TAKE 1 TABLET BY MOUTH EVERY DAY (Patient taking differently: Take 40 mg by mouth daily.) 90 tablet 3   warfarin (COUMADIN) 5 MG tablet Take 1/2-1 tablet daily as directed by the coumadin clinic. (Patient taking differently: Take 2.5-5 mg by mouth See admin instructions. Take 2.5 mg (1/2 tablet) by mouth daily except for on Saturday's and Thursday's. On Saturday's and Thursday's, take 5 mg (1 tablet).) 90 tablet 0   No facility-administered medications prior to visit.    Physical Exam    01/04/2023   10:53 AM 12/30/2022   11:35 AM 12/30/2022    8:09 AM  Vitals with BMI  Height 6' 0.01"    Weight 228 lbs 10 oz    BMI 31    Systolic [phone]  Diastolic 61 64 63  Pulse 61  70 68   Gen: Alert, well appearing.  Patient is oriented to person, place, time, and situation. AFFECT: pleasant, lucid thought and speech. Incision just distal to the antecubital fossa is intact and without erythema or drainage.  No significant tenderness to palpation.  He has diffuse ecchymoses inferior aspect of forearm.  Radial and ulnar pulses strong.  Skin is pink and warm.  Arm strength 5 out of 5 proximally distally.   Pertinent labs/imaging Last CBC Lab Results  Component Value Date   WBC 7.2 12/30/2022   HGB 12.8 (L) 12/30/2022   HCT 39.3 12/30/2022   MCV 89.9 12/30/2022   MCH 29.3  12/30/2022   RDW 13.7 12/30/2022   PLT 152 33/83/2919   Last metabolic panel Lab Results  Component Value Date   GLUCOSE 107 (H) 12/30/2022   NA 138 12/30/2022   K 4.3 12/30/2022   CL 102 12/30/2022   CO2 28 12/30/2022   BUN 17 12/30/2022   CREATININE 1.04 12/30/2022   GFRNONAA >60 12/30/2022   CALCIUM 8.8 (L) 12/30/2022   PHOS 3.1 12/30/2022   PROT 6.7 12/27/2022   ALBUMIN 3.3 (L) 12/30/2022   BILITOT 0.9 12/27/2022   ALKPHOS 38 12/27/2022   AST 31 12/27/2022   ALT 23 12/27/2022   ANIONGAP 8 12/30/2022   Lab Results  Component Value Date   INR 1.5 (A) 01/01/2023   INR 1.1 12/30/2022   INR 1.3 (H) 12/28/2022   ASSESSMENT/PLAN:  #1 arterial thrombosis right arm.  He is status post embolectomy of right brachial, ulnar, and radial arteries on 12/27/2022.  This occurred in the setting of a recent brief period off of his Coumadin so he could get prostate radioactive seed implant procedure for treatment of his prostate cancer.  He is in the process of bridging back to Coumadin, followed by Coumadin clinic. Right arm perfusion appears excellent at this time. He will continue aspirin and Coumadin long-term, as per prior to his prostate seed implant procedure.  2.  Hematuria. He had some in the hospital, which is to be expected with recent seed implant procedure.  He says he continues  to have only a dropper to with each urination. This is getting better.  No indication for any blood work today.  Decision making of moderate level utilized today.  FOLLOW UP: As needed. Keep appointment already set with me for November this year.  Signed:  Crissie Sickles, MD           01/04/2023

## 2023-01-07 ENCOUNTER — Ambulatory Visit: Payer: PPO | Attending: Cardiology | Admitting: *Deleted

## 2023-01-07 DIAGNOSIS — Z5181 Encounter for therapeutic drug level monitoring: Secondary | ICD-10-CM | POA: Diagnosis not present

## 2023-01-07 DIAGNOSIS — Z954 Presence of other heart-valve replacement: Secondary | ICD-10-CM | POA: Diagnosis not present

## 2023-01-07 DIAGNOSIS — I7121 Aneurysm of the ascending aorta, without rupture: Secondary | ICD-10-CM | POA: Diagnosis not present

## 2023-01-07 DIAGNOSIS — Z9889 Other specified postprocedural states: Secondary | ICD-10-CM | POA: Diagnosis not present

## 2023-01-07 LAB — POCT INR: INR: 1.6 — AB (ref 2.0–3.0)

## 2023-01-07 MED ORDER — ENOXAPARIN SODIUM 100 MG/ML IJ SOSY: 100.0000 mg | PREFILLED_SYRINGE | Freq: Two times a day (BID) | INTRAMUSCULAR | 0 refills | Status: DC

## 2023-01-07 NOTE — Patient Instructions (Signed)
Description   CONTINUE LOVENOX INJECTIONS TWICE A DAY. TAKE ANOTHER 1/2 TABLET TODAY AND TOMORROW TAKE 1 TABLET  then continue taking warfarin 1/2 tablet daily except for 1 tablet on Sunday and Thursday. Recheck INR on Monday. Stay consistent with greens. Coumadin Clinic [phone];

## 2023-01-11 ENCOUNTER — Ambulatory Visit: Payer: PPO | Attending: Internal Medicine | Admitting: *Deleted

## 2023-01-11 DIAGNOSIS — Z9889 Other specified postprocedural states: Secondary | ICD-10-CM | POA: Diagnosis not present

## 2023-01-11 DIAGNOSIS — Z5181 Encounter for therapeutic drug level monitoring: Secondary | ICD-10-CM | POA: Diagnosis not present

## 2023-01-11 LAB — POCT INR: POC INR: 2.3

## 2023-01-11 NOTE — Patient Instructions (Addendum)
Description   Stop lovenox.  Continue to take warfarin 1/2 a tablet daily except for 1 tablet on Sunday and Thursdays.  Recheck INR in 1 week. Stay consistent with greens. Coumadin Clinic [phone];

## 2023-01-13 ENCOUNTER — Telehealth: Payer: Self-pay | Admitting: *Deleted

## 2023-01-13 NOTE — Telephone Encounter (Signed)
CALLED PATIENT TO REMIND OF POST SEED APPTS. FOR 01-14-23, SPOKE WITH PATIENT AND HE IS AWARE OF THESE APPTS.

## 2023-01-14 ENCOUNTER — Encounter: Payer: Self-pay | Admitting: Urology

## 2023-01-14 ENCOUNTER — Ambulatory Visit
Admission: RE | Admit: 2023-01-14 | Discharge: 2023-01-14 | Disposition: A | Discharge status: Home / Self Care | Payer: PPO | Source: Ambulatory Visit | Attending: Urology | Admitting: Urology

## 2023-01-14 ENCOUNTER — Ambulatory Visit
Admission: RE | Admit: 2023-01-14 | Discharge: 2023-01-14 | Disposition: A | Discharge status: Home / Self Care | Payer: PPO | Source: Ambulatory Visit | Attending: Radiation Oncology | Admitting: Radiation Oncology

## 2023-01-14 VITALS — BP 90/50 | HR 70 | Temp 97.4°F | Resp 19 | Ht 72.0 in | Wt 222.0 lb

## 2023-01-14 DIAGNOSIS — Z7982 Long term (current) use of aspirin: Secondary | ICD-10-CM | POA: Insufficient documentation

## 2023-01-14 DIAGNOSIS — C61 Malignant neoplasm of prostate: Secondary | ICD-10-CM | POA: Insufficient documentation

## 2023-01-14 DIAGNOSIS — Z79899 Other long term (current) drug therapy: Secondary | ICD-10-CM | POA: Insufficient documentation

## 2023-01-14 DIAGNOSIS — Z923 Personal history of irradiation: Secondary | ICD-10-CM | POA: Insufficient documentation

## 2023-01-14 DIAGNOSIS — Z7901 Long term (current) use of anticoagulants: Secondary | ICD-10-CM | POA: Insufficient documentation

## 2023-01-14 NOTE — Progress Notes (Signed)
  Radiation Oncology         [phone] ________________________________  Name: Kenneth Ryan MRN: [id number]  Date: 01/14/2023  DOB: 10/04/1946  COMPLEX SIMULATION NOTE  NARRATIVE:  The patient was brought to the [facility] today following prostate seed implantation approximately one month ago.  Identity was confirmed.  All relevant records and images related to the planned course of therapy were reviewed.  Then, the patient was set-up supine.  CT images were obtained.  The CT images were loaded into the planning software.  Then the prostate and rectum were contoured.  Treatment planning then occurred.  The implanted iodine 125 seeds were identified by the physics staff for projection of radiation distribution  I have requested : 3D Simulation  I have requested a DVH of the following structures: Prostate and rectum.    ________________________________  Sheral Apley [name], M.D.

## 2023-01-14 NOTE — Progress Notes (Signed)
Post-seed nursing interview for a  77 y.o. gentleman with Stage T1c adenocarcinoma of the prostate with Gleason score of 3+4, and PSA of 4.87. I verified patient's identity and began nursing interview.  Patient reports doing well. No complaints at this time.  Meaningful use complete. I-PSS score of 10- moderate. No urinary management medications. Urology appt- 01/15/23 w/ Dr. Jeffie Pollock at [facility].  BP (!) 90/50 (BP Location: Left Arm, Patient Position: Sitting, Cuff Size: Normal)   Pulse 70   Temp (!) 97.4 F (36.3 C) (Temporal)   Resp 19   Ht 6' (1.829 m)   Wt 222 lb (100.7 kg)   SpO2 100%   BMI 30.11 kg/m   This concludes the interview.   Leandra Kern, LPN

## 2023-01-14 NOTE — Progress Notes (Signed)
Radiation Oncology         [phone] ________________________________  Name: Kenneth Ryan MRN: [id number]  Date: 01/14/2023  DOB: 1946-04-05  Post-Seed Follow-Up Visit Note  CC: [name], [name], MD  Tammi Sou, MD  Diagnosis:   77 y.o. gentleman with Stage T1c adenocarcinoma of the prostate with Gleason score of 3+4, and PSA of 4.87.      ICD-10-CM   1. Malignant neoplasm of prostate ([place])  C61       Interval Since Last Radiation:  3 weeks 12/25/22:  Insertion of radioactive I-125 seeds into the prostate gland; 145 Gy, definitive therapy with placement of SpaceOAR gel.  Narrative:  The patient returns today for routine follow-up.  He is complaining of increased urinary frequency and urinary hesitation symptoms. He filled out a questionnaire regarding urinary function today providing and overall IPSS score of 10 characterizing his symptoms as mild-moderate with increased frequency, intermittency and some hesitancy. He specifically denies dysuria, gross hematuria, straining to void or incontinence.  His pre-implant score was 5. He denies any abdominal pain or bowel symptoms. He did have some constipation initially that was managed with Miralax daily and now he is just using prune juice in the mornings to stay regular. He has noted decreased stamina but is able to remain active. Unfortunately, he had a post-operative complication with a brachial artery occlusion, 2 days post-op, likely related to being off his coumadin prior to his procedure. He underwent an emergency embolectomy with Dr. Virl Cagey and is recovering well. Overall, he is pleased with his progress to date.  ALLERGIES:  has No Known Allergies.  Meds: Current Outpatient Medications  Medication Sig Dispense Refill   acetaminophen (TYLENOL) 500 MG tablet Take 500-1,000 mg by mouth every 6 (six) hours as needed for mild pain (depends on pain level if takes 500 mg or 1000 mg).     aspirin EC 81 MG EC tablet Take 1  tablet (81 mg total) by mouth daily.     enoxaparin (LOVENOX) 100 MG/ML injection Inject 1 mL (100 mg total) into the skin every 12 (twelve) hours for 7 days. 10 mL 0   fenofibrate (TRICOR) 145 MG tablet TAKE 1 TABLET BY MOUTH DAILY (Patient taking differently: Take 145 mg by mouth daily. TAKE 1 TABLET BY MOUTH DAILY) 90 tablet 3   furosemide (LASIX) 20 MG tablet Take 1 tablet (20 mg total) by mouth daily as needed for fluid or edema (2 or more pounds). (Patient taking differently: Take 20 mg by mouth daily as needed for fluid or edema (2 or more pounds).) 30 tablet 1   HYDROcodone-acetaminophen (NORCO/VICODIN) 5-325 MG tablet Take 2 tablets by mouth every 6 (six) hours as needed for moderate pain. 6 tablet 0   irbesartan (AVAPRO) 300 MG tablet TAKE 1 TABLET BY MOUTH EVERY DAY (Patient taking differently: Take 300 mg by mouth daily.) 90 tablet 3   metoprolol succinate (TOPROL-XL) 50 MG 24 hr tablet TAKE 1 TABLET BY MOUTH EVERY DAY (Patient taking differently: Take 50 mg by mouth daily.) 90 tablet 3   Multiple Vitamins-Minerals (MULTIVITAMIN PO) Take 1 tablet by mouth daily.      pantoprazole (PROTONIX) 40 MG tablet TAKE 1 TABLET BY MOUTH EVERY DAY (Patient taking differently: Take 40 mg by mouth daily.) 90 tablet 3   warfarin (COUMADIN) 5 MG tablet Take 1/2-1 tablet daily as directed by the coumadin clinic. (Patient taking differently: Take 2.5-5 mg by mouth See admin instructions. Take 2.5 mg (1/2  tablet) by mouth daily except for on Saturday's and Thursday's. On Saturday's and Thursday's, take 5 mg (1 tablet).) 90 tablet 0   No current facility-administered medications for this visit.    Physical Findings: In general this is a well appearing Caucasian male in no acute distress. He's alert and oriented x4 and appropriate throughout the examination. Cardiopulmonary assessment is negative for acute distress and he exhibits normal effort.  Staples/incision in right forearm is without discharge, bleeding  or erythema. Mild residual ecchymosis in the right forearm.  Lab Findings: Lab Results  Component Value Date   WBC 7.2 12/30/2022   HGB 12.8 (L) 12/30/2022   HCT 39.3 12/30/2022   MCV 89.9 12/30/2022   PLT 152 12/30/2022    Radiographic Findings:  Patient underwent CT imaging in our clinic for post implant dosimetry. The CT will be reviewed by Dr. [name] to confirm there is an adequate distribution of radioactive seeds throughout the prostate gland and ensure that there are no seeds in or near the rectum.  We suspect the final radiation plan and dosimetry will show appropriate coverage of the prostate gland. He understands that we will call and inform him of any unexpected findings on further review of his imaging and dosimetry.  Impression/Plan: 77 y.o. gentleman with Stage T1c adenocarcinoma of the prostate with Gleason score of 3+4, and PSA of 4.87.   The patient is recovering from the effects of radiation. His urinary symptoms should gradually improve over the next 4-6 months. We talked about this today. He is encouraged by his improvement already and is otherwise pleased with his outcome. We also talked about long-term follow-up for prostate cancer following seed implant. He understands that ongoing PSA determinations and digital rectal exams will help perform surveillance to rule out disease recurrence. He has a follow up appointment scheduled with [name], NP at 11am on 01/15/23 and then will see Dr. Jeffie Pollock in April/May. He understands what to expect with his PSA measures. Patient was also educated today about some of the long-term effects from radiation including a small risk for rectal bleeding and possibly erectile dysfunction. We talked about some of the general management approaches to these potential complications. However, I did encourage the patient to contact our office or return at any point if he has questions or concerns related to his previous radiation and prostate  cancer.    Kenneth Johns, PA-C

## 2023-01-15 DIAGNOSIS — C61 Malignant neoplasm of prostate: Secondary | ICD-10-CM | POA: Diagnosis not present

## 2023-01-18 ENCOUNTER — Ambulatory Visit: Payer: PPO | Attending: Cardiovascular Disease

## 2023-01-18 DIAGNOSIS — Z5181 Encounter for therapeutic drug level monitoring: Secondary | ICD-10-CM

## 2023-01-18 DIAGNOSIS — Z9889 Other specified postprocedural states: Secondary | ICD-10-CM

## 2023-01-18 LAB — POCT INR: INR: 2.2 (ref 2.0–3.0)

## 2023-01-18 NOTE — Patient Instructions (Signed)
Description   Continue to take warfarin 1/2 a tablet daily except for 1 tablet on Sunday and Thursdays.  Recheck INR in 4 weeks.  Stay consistent with greens.  Coumadin Clinic [phone];

## 2023-01-21 ENCOUNTER — Ambulatory Visit
Admission: RE | Admit: 2023-01-21 | Discharge: 2023-01-21 | Disposition: A | Discharge status: Home / Self Care | Payer: PPO | Source: Ambulatory Visit | Attending: Radiation Oncology | Admitting: Radiation Oncology

## 2023-01-21 ENCOUNTER — Encounter: Payer: Self-pay | Admitting: Radiation Oncology

## 2023-01-21 DIAGNOSIS — C61 Malignant neoplasm of prostate: Secondary | ICD-10-CM | POA: Insufficient documentation

## 2023-01-21 DIAGNOSIS — Z191 Hormone sensitive malignancy status: Secondary | ICD-10-CM | POA: Diagnosis not present

## 2023-01-21 NOTE — Progress Notes (Signed)
  Radiation Oncology         [phone] ________________________________  Name: Kenneth Ryan MRN: [id number]  Date: 01/21/2023  DOB: [birth year]  3D Planning Note   Prostate Brachytherapy Post-Implant Dosimetry  Diagnosis: 77 y.o. gentleman with Stage T1c adenocarcinoma of the prostate with Gleason score of 3+4, and PSA of 4.87.   Narrative: On a previous date, Kenneth Ryan returned following prostate seed implantation for post implant planning. He underwent CT scan complex simulation to delineate the three-dimensional structures of the pelvis and demonstrate the radiation distribution.  Since that time, the seed localization, and complex isodose planning with dose volume histograms have now been completed.  Results:   Prostate Coverage - The dose of radiation delivered to the 90% or more of the prostate gland (D90) was 121.79% of the prescription dose. This exceeds our goal of greater than 90%. Rectal Sparing - The volume of rectal tissue receiving the prescription dose or higher was 0.0 cc. This falls under our thresholds tolerance of 1.0 cc.  Impression: The prostate seed implant appears to show adequate target coverage and appropriate rectal sparing.  Plan:  The patient will continue to follow with urology for ongoing PSA determinations. I would anticipate a high likelihood for local tumor control with minimal risk for rectal morbidity.  ________________________________  Sheral Apley [name], M.D.

## 2023-01-22 ENCOUNTER — Ambulatory Visit (INDEPENDENT_AMBULATORY_CARE_PROVIDER_SITE_OTHER): Payer: PPO | Admitting: Physician Assistant

## 2023-01-22 VITALS — BP 101/63 | HR 67 | Temp 97.9°F | Resp 20 | Ht 72.0 in | Wt 223.3 lb

## 2023-01-22 DIAGNOSIS — I742 Embolism and thrombosis of arteries of the upper extremities: Secondary | ICD-10-CM

## 2023-01-22 NOTE — Progress Notes (Signed)
POST OPERATIVE OFFICE NOTE    CC:  F/u for surgery  HPI: Kenneth Ryan is a 77 y.o. male who is s/p right brachial artery cutdown with embolectomy of the brachial, ulnar, and radial arteries on 12/27/2022 by Dr. Virl Cagey.  This was done to treat right upper extremity acute limb ischemia after the patient had to hold his Coumadin for brachytherapy seed placement.  Postoperatively, the patient was transitioned back onto his Coumadin and placed on a baby aspirin daily.  Pt returns today for follow up.  Pt states he has done well since surgery.  He denies any new instances of arm weakness, numbness, tingling, or excessive coldness.  He denies any drainage or redness at his incision site.  He is taking his Coumadin and aspirin daily.   No Known Allergies  Current Outpatient Medications  Medication Sig Dispense Refill   acetaminophen (TYLENOL) 500 MG tablet Take 500-1,000 mg by mouth every 6 (six) hours as needed for mild pain (depends on pain level if takes 500 mg or 1000 mg).     aspirin EC 81 MG EC tablet Take 1 tablet (81 mg total) by mouth daily.     fenofibrate (TRICOR) 145 MG tablet TAKE 1 TABLET BY MOUTH DAILY (Patient taking differently: Take 145 mg by mouth daily. TAKE 1 TABLET BY MOUTH DAILY) 90 tablet 3   furosemide (LASIX) 20 MG tablet Take 1 tablet (20 mg total) by mouth daily as needed for fluid or edema (2 or more pounds). (Patient taking differently: Take 20 mg by mouth daily as needed for fluid or edema (2 or more pounds).) 30 tablet 1   HYDROcodone-acetaminophen (NORCO/VICODIN) 5-325 MG tablet Take 2 tablets by mouth every 6 (six) hours as needed for moderate pain. 6 tablet 0   irbesartan (AVAPRO) 300 MG tablet TAKE 1 TABLET BY MOUTH EVERY DAY (Patient taking differently: Take 300 mg by mouth daily.) 90 tablet 3   metoprolol succinate (TOPROL-XL) 50 MG 24 hr tablet TAKE 1 TABLET BY MOUTH EVERY DAY (Patient taking differently: Take 50 mg by mouth daily.) 90 tablet 3   Multiple  Vitamins-Minerals (MULTIVITAMIN PO) Take 1 tablet by mouth daily.      pantoprazole (PROTONIX) 40 MG tablet TAKE 1 TABLET BY MOUTH EVERY DAY (Patient taking differently: Take 40 mg by mouth daily.) 90 tablet 3   warfarin (COUMADIN) 5 MG tablet Take 1/2-1 tablet daily as directed by the coumadin clinic. (Patient taking differently: Take 2.5-5 mg by mouth See admin instructions. Take 2.5 mg (1/2 tablet) by mouth daily except for on Saturday's and Thursday's. On Saturday's and Thursday's, take 5 mg (1 tablet).) 90 tablet 0   enoxaparin (LOVENOX) 100 MG/ML injection Inject 1 mL (100 mg total) into the skin every 12 (twelve) hours for 7 days. 10 mL 0   No current facility-administered medications for this visit.     ROS:  See HPI  Physical Exam:  Incision: Right upper extremity incision intact and staples removed.  No signs of drainage or infection Extremities: 2+ palpable radial and ulnar pulses on the right Neuro: Intact motor and sensation of the right upper extremity    Assessment/Plan:  This is a 77 y.o. male who is s/p: Right brachial artery cutdown with embolectomy of the brachial, ulnar, and radial arteries on 12/27/2022   -The patient's right upper extremity incision has healed well without issue.  His staples were removed today -The patient has no more instances of weakness, numbness, excessive coldness.  He has intact  motor and sensation of the right upper extremity.  He has palpable radial and ulnar pulses on the right 2+ -He will continue to take his Coumadin and baby aspirin daily indefinitely.  He can follow-up with our office as needed   Vicente Serene, PA-C [facility] [phone]   Clinic MD:  Virl Cagey

## 2023-01-28 ENCOUNTER — Other Ambulatory Visit: Payer: Self-pay | Admitting: Cardiology

## 2023-01-28 DIAGNOSIS — Z9889 Other specified postprocedural states: Secondary | ICD-10-CM

## 2023-01-28 DIAGNOSIS — I420 Dilated cardiomyopathy: Secondary | ICD-10-CM

## 2023-01-28 DIAGNOSIS — Z954 Presence of other heart-valve replacement: Secondary | ICD-10-CM

## 2023-01-28 DIAGNOSIS — I10 Essential (primary) hypertension: Secondary | ICD-10-CM

## 2023-01-28 DIAGNOSIS — I7121 Aneurysm of the ascending aorta, without rupture: Secondary | ICD-10-CM

## 2023-01-28 MED ORDER — WARFARIN SODIUM 5 MG PO TABS: ORAL_TABLET | ORAL | 0 refills | Status: DC

## 2023-01-28 NOTE — Telephone Encounter (Signed)
Prescription refill request received for warfarin Lov: 12/07/22 Sarajane Jews)  Next INR check: 02/15/23 Warfarin tablet strength: '5mg'$   Appropriate dose. Refill sent.

## 2023-01-28 NOTE — Addendum Note (Signed)
Addended by: Leonidas Romberg on: 01/28/2023 02:10 PM   Modules accepted: Orders

## 2023-02-15 ENCOUNTER — Ambulatory Visit: Payer: PPO | Attending: Cardiology

## 2023-02-15 DIAGNOSIS — Z5181 Encounter for therapeutic drug level monitoring: Secondary | ICD-10-CM

## 2023-02-15 DIAGNOSIS — Z9889 Other specified postprocedural states: Secondary | ICD-10-CM | POA: Diagnosis not present

## 2023-02-15 LAB — POCT INR: INR: 3.2 — AB (ref 2.0–3.0)

## 2023-02-15 NOTE — Patient Instructions (Signed)
Description   Hold tomorrow's dose and then continue to take warfarin 1/2 a tablet daily except for 1 tablet on Sunday and Thursdays.  Recheck INR in 4 weeks.  Stay consistent with greens.  [facility] [phone];

## 2023-03-08 ENCOUNTER — Ambulatory Visit: Payer: PPO | Attending: Cardiology | Admitting: *Deleted

## 2023-03-08 DIAGNOSIS — Z5181 Encounter for therapeutic drug level monitoring: Secondary | ICD-10-CM | POA: Diagnosis not present

## 2023-03-08 DIAGNOSIS — Z9889 Other specified postprocedural states: Secondary | ICD-10-CM

## 2023-03-08 DIAGNOSIS — I7121 Aneurysm of the ascending aorta, without rupture: Secondary | ICD-10-CM

## 2023-03-08 DIAGNOSIS — Z954 Presence of other heart-valve replacement: Secondary | ICD-10-CM

## 2023-03-08 LAB — POCT INR: INR: 3.2 — AB (ref 2.0–3.0)

## 2023-03-08 NOTE — Patient Instructions (Signed)
Description   Hold today's dose and then START taking warfarin 1/2 tablet daily except for 1 tablet on Sundays. Recheck INR in 3 weeks. Stay consistent with greens.  [facility] [phone];

## 2023-03-30 ENCOUNTER — Ambulatory Visit: Payer: PPO | Attending: Cardiology

## 2023-03-30 DIAGNOSIS — Z9889 Other specified postprocedural states: Secondary | ICD-10-CM

## 2023-03-30 DIAGNOSIS — Z5181 Encounter for therapeutic drug level monitoring: Secondary | ICD-10-CM | POA: Diagnosis not present

## 2023-03-30 DIAGNOSIS — Z954 Presence of other heart-valve replacement: Secondary | ICD-10-CM

## 2023-03-30 DIAGNOSIS — I7121 Aneurysm of the ascending aorta, without rupture: Secondary | ICD-10-CM

## 2023-03-30 LAB — POCT INR: INR: 2.2 (ref 2.0–3.0)

## 2023-03-30 NOTE — Patient Instructions (Signed)
Description   Continue on same dosage of Warfarin 1/2 tablet daily except for 1 tablet on Sundays. Recheck INR in 4 weeks. Stay consistent with greens.  [facility] [phone];

## 2023-04-09 DIAGNOSIS — C61 Malignant neoplasm of prostate: Secondary | ICD-10-CM | POA: Diagnosis not present

## 2023-04-13 ENCOUNTER — Telehealth: Payer: Self-pay | Admitting: Family Medicine

## 2023-04-13 NOTE — Telephone Encounter (Signed)
ERRONEOUS... OPENED IN ERROR

## 2023-04-13 NOTE — Telephone Encounter (Signed)
Contacted Kenneth Ryan to schedule their annual wellness visit. Appointment made for 04/14/2023.  Gabriel Cirri [facility] TEAM Direct Dial [phone]

## 2023-04-14 ENCOUNTER — Ambulatory Visit (INDEPENDENT_AMBULATORY_CARE_PROVIDER_SITE_OTHER): Payer: PPO

## 2023-04-14 VITALS — Wt 223.0 lb

## 2023-04-14 DIAGNOSIS — Z0000 Encounter for general adult medical examination without abnormal findings: Secondary | ICD-10-CM | POA: Diagnosis not present

## 2023-04-14 NOTE — Patient Instructions (Signed)
Kenneth Ryan , Thank you for taking time to come for your Medicare Wellness Visit. I appreciate your ongoing commitment to your health goals. Please review the following plan we discussed and let me know if I can assist you in the future.   These are the goals we discussed:  Goals      Increase physical activity     Start using treadmill again.      Patient Stated     Complete work on my hot rod     Patient Stated     None at this time         This is a list of the screening recommended for you and due dates:  Health Maintenance  Topic Date Due   COVID-19 Vaccine (5 - 2023-24 season) 08/21/2022   Hepatitis C Screening: USPSTF Recommendation to screen - Ages 18-79 yo.  10/03/2036*   Flu Shot  07/22/2023   Medicare Annual Wellness Visit  04/13/2024   DTaP/Tdap/Td vaccine (2 - Td or Tdap) 08/27/2025   Colon Cancer Screening  06/06/2031   Pneumonia Vaccine  Completed   Zoster (Shingles) Vaccine  Completed   HPV Vaccine  Aged Out  *Topic was postponed. The date shown is not the original due date.    Advanced directives: Please bring a copy of your health care power of attorney and living will to the office at your convenience.  Conditions/risks identified: none at this time   Next appointment: Follow up in one year for your annual wellness visit.   Preventive Care 78 Years and Older, Male  Preventive care refers to lifestyle choices and visits with your health care provider that can promote health and wellness. What does preventive care include? A yearly physical exam. This is also called an annual well check. Dental exams once or twice a year. Routine eye exams. Ask your health care provider how often you should have your eyes checked. Personal lifestyle choices, including: Daily care of your teeth and gums. Regular physical activity. Eating a healthy diet. Avoiding tobacco and drug use. Limiting alcohol use. Practicing safe sex. Taking low doses of aspirin every  day. Taking vitamin and mineral supplements as recommended by your health care provider. What happens during an annual well check? The services and screenings done by your health care provider during your annual well check will depend on your age, overall health, lifestyle risk factors, and family history of disease. Counseling  Your health care provider may ask you questions about your: Alcohol use. Tobacco use. Drug use. Emotional well-being. Home and relationship well-being. Sexual activity. Eating habits. History of falls. Memory and ability to understand (cognition). Work and work Astronomer. Screening  You may have the following tests or measurements: Height, weight, and BMI. Blood pressure. Lipid and cholesterol levels. These may be checked every 5 years, or more frequently if you are over 28 years old. Skin check. Lung cancer screening. You may have this screening every year starting at age 63 if you have a 30-pack-year history of smoking and currently smoke or have quit within the past 15 years. Fecal occult blood test (FOBT) of the stool. You may have this test every year starting at age 74. Flexible sigmoidoscopy or colonoscopy. You may have a sigmoidoscopy every 5 years or a colonoscopy every 10 years starting at age 63. Prostate cancer screening. Recommendations will vary depending on your family history and other risks. Hepatitis C blood test. Hepatitis B blood test. Sexually transmitted disease (STD) testing. Diabetes screening.  This is done by checking your blood sugar (glucose) after you have not eaten for a while (fasting). You may have this done every 1-3 years. Abdominal aortic aneurysm (AAA) screening. You may need this if you are a current or former smoker. Osteoporosis. You may be screened starting at age 82 if you are at high risk. Talk with your health care provider about your test results, treatment options, and if necessary, the need for more  tests. Vaccines  Your health care provider may recommend certain vaccines, such as: Influenza vaccine. This is recommended every year. Tetanus, diphtheria, and acellular pertussis (Tdap, Td) vaccine. You may need a Td booster every 10 years. Zoster vaccine. You may need this after age 86. Pneumococcal 13-valent conjugate (PCV13) vaccine. One dose is recommended after age 5. Pneumococcal polysaccharide (PPSV23) vaccine. One dose is recommended after age 53. Talk to your health care provider about which screenings and vaccines you need and how often you need them. This information is not intended to replace advice given to you by your health care provider. Make sure you discuss any questions you have with your health care provider. Document Released: 01/03/2016 Document Revised: 08/26/2016 Document Reviewed: 10/08/2015 Elsevier Interactive Patient Education  2017 [place] Prevention in the Home Falls can cause injuries. They can happen to people of all ages. There are many things you can do to make your home safe and to help prevent falls. What can I do on the outside of my home? Regularly fix the edges of walkways and driveways and fix any cracks. Remove anything that might make you trip as you walk through a door, such as a raised step or threshold. Trim any bushes or trees on the path to your home. Use bright outdoor lighting. Clear any walking paths of anything that might make someone trip, such as rocks or tools. Regularly check to see if handrails are loose or broken. Make sure that both sides of any steps have handrails. Any raised decks and porches should have guardrails on the edges. Have any leaves, snow, or ice cleared regularly. Use sand or salt on walking paths during winter. Clean up any spills in your garage right away. This includes oil or grease spills. What can I do in the bathroom? Use night lights. Install grab bars by the toilet and in the tub and shower.  Do not use towel bars as grab bars. Use non-skid mats or decals in the tub or shower. If you need to sit down in the shower, use a plastic, non-slip stool. Keep the floor dry. Clean up any water that spills on the floor as soon as it happens. Remove soap buildup in the tub or shower regularly. Attach bath mats securely with double-sided non-slip rug tape. Do not have throw rugs and other things on the floor that can make you trip. What can I do in the bedroom? Use night lights. Make sure that you have a light by your bed that is easy to reach. Do not use any sheets or blankets that are too big for your bed. They should not hang down onto the floor. Have a firm chair that has side arms. You can use this for support while you get dressed. Do not have throw rugs and other things on the floor that can make you trip. What can I do in the kitchen? Clean up any spills right away. Avoid walking on wet floors. Keep items that you use a lot in easy-to-reach places. If  you need to reach something above you, use a strong step stool that has a grab bar. Keep electrical cords out of the way. Do not use floor polish or wax that makes floors slippery. If you must use wax, use non-skid floor wax. Do not have throw rugs and other things on the floor that can make you trip. What can I do with my stairs? Do not leave any items on the stairs. Make sure that there are handrails on both sides of the stairs and use them. Fix handrails that are broken or loose. Make sure that handrails are as long as the stairways. Check any carpeting to make sure that it is firmly attached to the stairs. Fix any carpet that is loose or worn. Avoid having throw rugs at the top or bottom of the stairs. If you do have throw rugs, attach them to the floor with carpet tape. Make sure that you have a light switch at the top of the stairs and the bottom of the stairs. If you do not have them, ask someone to add them for you. What else  can I do to help prevent falls? Wear shoes that: Do not have high heels. Have rubber bottoms. Are comfortable and fit you well. Are closed at the toe. Do not wear sandals. If you use a stepladder: Make sure that it is fully opened. Do not climb a closed stepladder. Make sure that both sides of the stepladder are locked into place. Ask someone to hold it for you, if possible. Clearly mark and make sure that you can see: Any grab bars or handrails. First and last steps. Where the edge of each step is. Use tools that help you move around (mobility aids) if they are needed. These include: Canes. Walkers. Scooters. Crutches. Turn on the lights when you go into a dark area. Replace any light bulbs as soon as they burn out. Set up your furniture so you have a clear path. Avoid moving your furniture around. If any of your floors are uneven, fix them. If there are any pets around you, be aware of where they are. Review your medicines with your doctor. Some medicines can make you feel dizzy. This can increase your chance of falling. Ask your doctor what other things that you can do to help prevent falls. This information is not intended to replace advice given to you by your health care provider. Make sure you discuss any questions you have with your health care provider. Document Released: 10/03/2009 Document Revised: 05/14/2016 Document Reviewed: 01/11/2015 Elsevier Interactive Patient Education  2017 [employer].

## 2023-04-14 NOTE — Progress Notes (Signed)
I connected with  Kenneth Ryan on 04/14/23 by a audio enabled telemedicine application and verified that I am speaking with the correct person using two identifiers.  Patient Location: Home  Provider Location: Home Office  I discussed the limitations of evaluation and management by telemedicine. The patient expressed understanding and agreed to proceed.   Subjective:   Kenneth Ryan is a 77 y.o. male who presents for Medicare Annual/Subsequent preventive examination.  Review of Systems     Cardiac Risk Factors include: advanced age (>30men, >81 women);hypertension;dyslipidemia;male gender;obesity (BMI >30kg/m2)     Objective:    Today's Vitals   04/14/23 1329  Weight: 223 lb (101.2 kg)   Body mass index is 30.24 kg/m.     04/14/2023    1:33 PM 01/14/2023   10:24 AM 10/22/2022    9:23 AM 08/31/2022   11:10 AM 07/15/2022    4:41 PM 03/12/2022    2:39 PM 06/05/2021    9:34 AM  Advanced Directives  Does Patient Have a Medical Advance Directive? Yes Yes Yes Yes Yes Yes Yes  Type of Estate agent of [place];Living will Living will;Healthcare Power of Attorney Living will;Healthcare Power of Attorney Living will;Healthcare Power of [employer] Power of [place];Living will Healthcare Power of [place];Living will Healthcare Power of [place];Living will  Does patient want to make changes to medical advance directive?      No - Patient declined   Copy of Healthcare Power of Attorney in Chart? No - copy requested     No - copy requested No - copy requested    Current Medications (verified) Outpatient Encounter Medications as of 04/14/2023  Medication Sig   acetaminophen (TYLENOL) 500 MG tablet Take 500-1,000 mg by mouth every 6 (six) hours as needed for mild pain (depends on pain level if takes 500 mg or 1000 mg).   aspirin EC 81 MG EC tablet Take 1 tablet (81 mg total) by mouth daily.   fenofibrate (TRICOR) 145 MG tablet TAKE 1 TABLET BY MOUTH  DAILY (Patient taking differently: Take 145 mg by mouth daily. TAKE 1 TABLET BY MOUTH DAILY)   irbesartan (AVAPRO) 300 MG tablet TAKE 1 TABLET BY MOUTH EVERY DAY (Patient taking differently: Take 300 mg by mouth daily.)   metoprolol succinate (TOPROL-XL) 50 MG 24 hr tablet TAKE 1 TABLET BY MOUTH EVERY DAY (Patient taking differently: Take 50 mg by mouth daily.)   Multiple Vitamins-Minerals (MULTIVITAMIN PO) Take 1 tablet by mouth daily.    pantoprazole (PROTONIX) 40 MG tablet TAKE 1 TABLET BY MOUTH EVERY DAY (Patient taking differently: Take 40 mg by mouth daily.)   warfarin (COUMADIN) 5 MG tablet Take 1/2 tablet to 1 tablet daily as directed by the coumadin clinic.   furosemide (LASIX) 20 MG tablet Take 1 tablet (20 mg total) by mouth daily as needed for fluid or edema (2 or more pounds). (Patient not taking: Reported on 04/14/2023)   [DISCONTINUED] enoxaparin (LOVENOX) 100 MG/ML injection Inject 1 mL (100 mg total) into the skin every 12 (twelve) hours for 7 days.   [DISCONTINUED] HYDROcodone-acetaminophen (NORCO/VICODIN) 5-325 MG tablet Take 2 tablets by mouth every 6 (six) hours as needed for moderate pain.   No facility-administered encounter medications on file as of 04/14/2023.    Allergies (verified) Patient has no known allergies.   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  Right +brach,rad,ulnar-->occured while he was briefly off coumadin-->thrombectomy by vasc surg   Chronic hoarseness    secondary to hx of laryngeal surgery   GERD (gastroesophageal reflux disease)    Barrett's esoph on EGD 06/06/21   History of basal cell carcinoma    nose   History of laryngeal cancer 2002   Surgery and radiation completed same year 2002---  oncologist released pt summer 2014   (benign bx by dr Kenneth Ryan in  10/ 2016)   History of lower Ryan bleeding 05/2021   s/p   EGD w/some non bleeding gastritis, Colonoscopy with int/ext hemorrhoids, 1 polyp   Hyperlipidemia, mixed    Malignant neoplasm prostate 07/2022   urologist-- Kenneth Ryan/  radiation oncologist--- dr Kenneth Ryan;  dx 08/ 2023,  Gleason 3+4   Nonischemic cardiomyopathy 08/2016   followed by cardiologist-  dr Kenneth Ryan;    per echo ef 40-45%  09/ 2017;   echo post surgery in 2017  ef 35%;  improved 2019 ;  last echo 05-07-2022  ef 55-60%   Pre-diabetes    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  Per CV surg, as of 02/16/17, his INR goal is 1.5-2.5.   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   Severe Mitral regurg;   s/p  Complex valvuloplasty including artificial Gore-tex neochord placement x6 and 28 mm Sorin Memo 3D ring annuloplasty   SOB (shortness of breath) on exertion    normal activities ok but more strenous acitivity sob which is baseline per cardiologist note   Wears glasses    Past Surgical History:  Procedure Laterality Date   BENTALL PROCEDURE N/A 11/03/2016   Procedure: BENTALL PROCEDURE AORTIC ROOT REPLACEMENT + mechanical AV;  Surgeon: Kenneth Nails, MD;  Location: [facility] OR;  Service: Open Heart Surgery;  Laterality: N/A;   BIOPSY  06/05/2021   Procedure: BIOPSY;  Surgeon: Kenneth Danas, MD;  Location: [facility] ENDOSCOPY;  Service: Gastroenterology;;   CARDIAC CATHETERIZATION N/A 09/29/2016   Clean coronaries, EF 60-65%.  Procedure: Right/Left Heart Cath and Coronary Angiography;  Surgeon: Kenneth Crafts, MD;  Location: [facility] INVASIVE CV LAB;  Service: Cardiovascular;  Laterality: N/A;   CHOLECYSTECTOMY  1980   COLONOSCOPY WITH PROPOFOL N/A  06/05/2021   diverticulosis descending and sigmoid, int+ext nonbleeding hemorr, 1 sessile seratted polyp w/out cytologic dysplasia. Procedure: COLONOSCOPY WITH PROPOFOL;  Surgeon: Kenneth Danas, MD;  Location: [facility]OS[facility];  Service: Gastroenterology;  Laterality: N/A;   DIRECT LARYNGOSCOPY Left 10/17/2015   Procedure: MICRO DIRECT LARYNGOSCOPY WITH FROZEN SECTION;  Surgeon: Flo Shanks, MD;  Location: [facility] OR;  Service: ENT;  Laterality: Left;   EMBOLECTOMY Right 12/27/2022   Procedure: RIGHT BRACHIAL, ULNAR, AND RADIAL ARTERY EMBOLECTOMY;  Surgeon: Victorino Sparrow, MD;  Location: [facility] OR;  Service: Vascular;  Laterality: Right;   ESOPHAGOGASTRODUODENOSCOPY (EGD) WITH PROPOFOL N/A 06/05/2021   Barrett's esoph.  Gastritis (h pylori neg), multiple benign gastric polyps. Procedure: ESOPHAGOGASTRODUODENOSCOPY (EGD) WITH PROPOFOL;  Surgeon: Kenneth Danas, MD;  Location: [facility]OS[facility];  Service: Gastroenterology;  Laterality: N/A;   LARYNX SURGERY  2002   removal vocal cord tumor (cancer)   MITRAL VALVE REPAIR  N/A 11/03/2016   Procedure: MITRAL VALVE REPAIR (MVR);  Surgeon: Kenneth Nails, MD;  Location: [facility] OR;  Service: Open Heart Surgery;  Laterality: N/A;   PERIPHERAL VASCULAR CATHETERIZATION N/A 09/29/2016   Procedure: Abdominal Aortogram;  Surgeon: Kenneth Crafts, MD;  Location: [facility] INVASIVE CV LAB;  Service: Cardiovascular;  Laterality: N/A;   PERIPHERAL VASCULAR CATHETERIZATION N/A 09/29/2016   Procedure: Thoracic Aortogram;  Surgeon: Kenneth Crafts, MD;  Location: [facility] INVASIVE CV LAB;  Service: Cardiovascular;  Laterality: N/A;   POLYPECTOMY  06/05/2021   Procedure: POLYPECTOMY;  Surgeon: Kenneth Danas, MD;  Location: [facility] ENDOSCOPY;  Service: Gastroenterology;;   RADIOACTIVE SEED IMPLANT N/A 12/25/2022   Procedure: RADIOACTIVE SEED IMPLANT/BRACHYTHERAPY IMPLANT;  Surgeon: Bjorn Pippin, MD;  Location: [facility];  Service: Urology;  Laterality: N/A;   SPACE OAR  INSTILLATION N/A 12/25/2022   Procedure: SPACE OAR INSTILLATION;  Surgeon: Bjorn Pippin, MD;  Location: [facility];  Service: Urology;  Laterality: N/A;   TEE WITHOUT CARDIOVERSION N/A 09/17/2016   Procedure: TRANSESOPHAGEAL ECHOCARDIOGRAM (TEE);  Surgeon: Jake Bathe, MD;  Location: [facility] ENDOSCOPY;  Service: Cardiovascular;  Laterality: N/A;   TEE WITHOUT CARDIOVERSION N/A 11/03/2016   Procedure: TRANSESOPHAGEAL ECHOCARDIOGRAM (TEE);  Surgeon: Kenneth Nails, MD;  Location: [facility] OR;  Service: Open Heart Surgery;  Laterality: N/A;   TONSILLECTOMY AND ADENOIDECTOMY     child   TRANSESOPHAGEAL ECHOCARDIOGRAM  09/17/2016   Severe MR with ruptured chordae.  Mod/severe AR w/functional bicuspid AV.  EF 45%.   Family History  Problem Relation Age of Onset   Heart disease Father    Breast cancer Mother    Breast cancer Sister    Diabetes Brother    Kidney failure Son    Breast cancer Sister    Social History   Socioeconomic History   Marital status: Widowed    Spouse name: Not on file   Number of children: 1   Years of education: Not on file   Highest education level: Not on file  Occupational History   Occupation: retired  Tobacco Use   Smoking status: Former    Years: 22    Types: Cigarettes    Quit date: 07/21/1989    Years since quitting: 33.7   Smokeless tobacco: Never  Vaping Use   Vaping Use: Never used  Substance and Sexual Activity   Alcohol use: Yes    Comment: rare   Drug use: Never   Sexual activity: Not on file  Other Topics Concern   Not on file  Social History Narrative   Widower (approx 05/07/14), one son deceased 05/08/11.   Relocated from [place] to [facility] 06/2013 to be near grandchildren.   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   Smoker 20 pack-yr hx: quit about 1980.   Alcohol: 3 bottles of beer per week avg.     Drugs: none         Social Determinants of Health   Financial Resource Strain: Low Risk  (04/14/2023)   Overall Financial Resource Strain  (CARDIA)    Difficulty of Paying Living Expenses: Not hard at all  Food Insecurity: No Food Insecurity (04/14/2023)   Hunger Vital Sign    Worried About Ryan Out of Food in the Last Year: Never true    Ran Out of Food in the Last Year: Never true  Transportation Needs: No Transportation Needs (04/14/2023)   PRAPARE - Transportation    Lack of Transportation (Medical): No    Lack of  Transportation (Non-Medical): No  Physical Activity: Inactive (04/14/2023)   Exercise Vital Sign    Days of Exercise per Week: 0 days    Minutes of Exercise per Session: 0 min  Stress: No Stress Concern Present (04/14/2023)   [employer] of Occupational Health - Occupational Stress Questionnaire    Feeling of Stress : Not at all  Social Connections: Moderately Isolated (04/14/2023)   Social Connection and Isolation Panel [NHANES]    Frequency of Communication with Friends and Family: More than three times a week    Frequency of Social Gatherings with Friends and Family: Three times a week    Attends Religious Services: More than 4 times per year    Active Member of Clubs or Organizations: No    Attends Banker Meetings: Never    Marital Status: Widowed    Tobacco Counseling Counseling given: Not Answered   Clinical Intake:  Pre-visit preparation completed: Yes  Pain : No/denies pain     BMI - recorded: 30.24 Nutritional Status: BMI > 30  Obese Nutritional Risks: None Diabetes: No  How often do you need to have someone help you when you read instructions, pamphlets, or other written materials from your doctor or pharmacy?: 1 - Never  Diabetic?no  Interpreter Needed?: No  Information entered by :: Lanier Ensign, LPN   Activities of Daily Living    04/14/2023    1:34 PM 12/26/2022   10:30 AM  In your present state of health, do you have any difficulty performing the following activities:  Hearing? 0 0  Vision? 0 0  Difficulty concentrating or making decisions? 0 0   Walking or climbing stairs? 0 0  Dressing or bathing? 0 0  Doing errands, shopping? 0 0  Preparing Food and eating ? N N  Using the Toilet? N N  In the past six months, have you accidently leaked urine? N N  Do you have problems with loss of bowel control? N N  Managing your Medications? N N  Managing your Finances? N N  Housekeeping or managing your Housekeeping? N N    Patient Care Team: Jeoffrey Massed, MD as PCP - General (Family Medicine) Jake Bathe, MD as PCP - Cardiology (Cardiology) Jake Bathe, MD as Consulting Physician (Cardiology) Kenneth Nails, MD (Inactive) as Consulting Physician (Cardiothoracic Surgery) Bjorn Pippin, MD as Consulting Physician (Urology) Yehuda Mao (Dentistry) Fredrich Birks, OD as Referring Physician (Optometry) Kenneth Danas, MD as Consulting Physician (Gastroenterology) Cherlyn Cushing, RN as Oncology Nurse Navigator  Indicate any recent Medical Services you may have received from other than [facility] providers in the past year (date may be approximate).     Assessment:   This is a routine wellness examination for Kenneth Ryan.  Hearing/Vision screen Hearing Screening - Comments:: Pt denies any hearing issues  Vision Screening - Comments:: Pt follows up with Dr Lorin Picket for annual eye exams   Dietary issues and exercise activities discussed: Current Exercise Habits: The patient does not participate in regular exercise at present   Goals Addressed             This Visit's Progress    Patient Stated       None at this time        Depression Screen    04/14/2023    1:33 PM 01/04/2023   11:00 AM 11/09/2022    9:08 AM 03/12/2022    2:38 PM 11/06/2021    9:12 AM 03/05/2021    9:56 AM 03/04/2020  12:59 PM  PHQ 2/9 Scores  PHQ - 2 Score 0 0 0 0 0 1 0  PHQ- 9 Score  0         Fall Risk    04/14/2023    1:34 PM 01/04/2023   11:00 AM 12/26/2022   10:30 AM 11/09/2022    9:08 AM 03/12/2022    2:40 PM  Fall Risk   Falls in the past year? 0  0 0 0 1  Number falls in past yr: 0 0 0 0 1  Injury with Fall? 0 0 0 0 1  Risk for fall due to : Impaired vision Impaired vision;No Fall Risks  Impaired vision History of fall(s)  Follow up Falls prevention discussed Falls evaluation completed  Falls evaluation completed Falls evaluation completed    FALL RISK PREVENTION PERTAINING TO THE HOME:  Any stairs in or around the home? No  If so, are there any without handrails? No  Home free of loose throw rugs in walkways, pet beds, electrical cords, etc? Yes  Adequate lighting in your home to reduce risk of falls? Yes   ASSISTIVE DEVICES UTILIZED TO PREVENT FALLS:  Life alert? No  Use of a cane, walker or w/c? No  Grab bars in the bathroom? Yes  Shower chair or bench in shower? No  Elevated toilet seat or a handicapped toilet? No   TIMED UP AND GO:  Was the test performed? No .    Cognitive Function:    03/12/2022    2:49 PM 03/12/2022    2:48 PM 02/27/2019   10:13 AM 02/15/2017    9:18 AM  MMSE - Mini Mental State Exam  Not completed: Unable to complete     Orientation to time  5 5 5   Orientation to Place  5 5 5   Registration  3 3 3   Attention/ Calculation  5 5 5   Recall  3 2 2   Language- name 2 objects  2 2 2   Language- repeat  1 1 1   Language- follow 3 step command  3 3 3   Language- read & follow direction  1 1 1   Write a sentence  1 1 1   Copy design  1 1 1   Total score  30 29 29         04/14/2023    1:35 PM 03/12/2022    2:43 PM  6CIT Screen  What Year? 0 points 0 points  What month? 0 points 0 points  What time? 0 points 0 points  Count back from 20 0 points 0 points  Months in reverse 0 points 0 points  Repeat phrase 0 points 0 points  Total Score 0 points 0 points    Immunizations Immunization History  Administered Date(s) Administered   Fluad Quad(high Dose 65+) 09/06/2019, 10/10/2021, 08/25/2022   Influenza, High Dose Seasonal PF 09/07/2016, 09/07/2017, 09/20/2018, 08/29/2020   Influenza,inj,Quad  PF,6+ Mos 09/15/2013, 08/28/2015   PFIZER(Purple Top)SARS-COV-2 Vaccination 02/18/2020, 03/19/2020, 09/20/2020   Pfizer Covid-19 Vaccine Bivalent Booster 98yrs & up 10/10/2021   Pneumococcal Conjugate-13 08/28/2015   Pneumococcal Polysaccharide-23 09/18/2016   Tdap 08/28/2015   Zoster Recombinat (Shingrix) 09/06/2019, 11/30/2019   Zoster, Live 05/06/2015    TDAP status: Up to date  Flu Vaccine status: Up to date  Pneumococcal vaccine status: Up to date  Covid-19 vaccine status: Completed vaccines  Qualifies for Shingles Vaccine? Yes   Zostavax completed Yes   Shingrix Completed?: Yes  Screening Tests Health Maintenance  Topic Date Due  COVID-19 Vaccine (5 - 2023-24 season) 08/21/2022   Hepatitis C Screening  10/03/2036 (Originally 05/15/1964)   INFLUENZA VACCINE  07/22/2023   Medicare Annual Wellness (AWV)  04/13/2024   DTaP/Tdap/Td (2 - Td or Tdap) 08/27/2025   COLONOSCOPY (Pts 45-86yrs Insurance coverage will need to be confirmed)  06/06/2031   Pneumonia Vaccine 25+ Years old  Completed   Zoster Vaccines- Shingrix  Completed   HPV VACCINES  Aged Out    Health Maintenance  Health Maintenance Due  Topic Date Due   COVID-19 Vaccine (5 - 2023-24 season) 08/21/2022    Colorectal cancer screening: Type of screening: Colonoscopy. Completed 06/05/21. Repeat every 10 years  Additional Screening:  Hepatitis C Screening: does not qualify  Vision Screening: Recommended annual ophthalmology exams for early detection of glaucoma and other disorders of the eye. Is the patient up to date with their annual eye exam?  Yes  Who is the provider or what is the name of the office in which the patient attends annual eye exams? Dr Lorin Picket If pt is not established with a provider, would they like to be referred to a provider to establish care? No .   Dental Screening: Recommended annual dental exams for proper oral hygiene  Community Resource Referral / Chronic Care Management: CRR  required this visit?  No   CCM required this visit?  No      Plan:     I have personally reviewed and noted the following in the patient's chart:   Medical and social history Use of alcohol, tobacco or illicit drugs  Current medications and supplements including opioid prescriptions. Patient is not currently taking opioid prescriptions. Functional ability and status Nutritional status Physical activity Advanced directives List of other physicians Hospitalizations, surgeries, and ER visits in previous 12 months Vitals Screenings to include cognitive, depression, and falls Referrals and appointments  In addition, I have reviewed and discussed with patient certain preventive protocols, quality metrics, and best practice recommendations. A written personalized care plan for preventive services as well as general preventive health recommendations were provided to patient.     Marzella Schlein, LPN   [id number]   Nurse Notes: none

## 2023-04-19 DIAGNOSIS — C61 Malignant neoplasm of prostate: Secondary | ICD-10-CM | POA: Diagnosis not present

## 2023-04-19 DIAGNOSIS — N401 Enlarged prostate with lower urinary tract symptoms: Secondary | ICD-10-CM | POA: Diagnosis not present

## 2023-04-19 DIAGNOSIS — R3914 Feeling of incomplete bladder emptying: Secondary | ICD-10-CM | POA: Diagnosis not present

## 2023-04-19 DIAGNOSIS — R351 Nocturia: Secondary | ICD-10-CM | POA: Diagnosis not present

## 2023-04-21 ENCOUNTER — Other Ambulatory Visit: Payer: Self-pay | Admitting: Cardiology

## 2023-04-21 DIAGNOSIS — I10 Essential (primary) hypertension: Secondary | ICD-10-CM

## 2023-04-21 DIAGNOSIS — Q2543 Congenital aneurysm of aorta: Secondary | ICD-10-CM

## 2023-04-21 DIAGNOSIS — I7121 Aneurysm of the ascending aorta, without rupture: Secondary | ICD-10-CM

## 2023-04-21 DIAGNOSIS — I420 Dilated cardiomyopathy: Secondary | ICD-10-CM

## 2023-04-21 DIAGNOSIS — Z9889 Other specified postprocedural states: Secondary | ICD-10-CM

## 2023-04-21 DIAGNOSIS — Z954 Presence of other heart-valve replacement: Secondary | ICD-10-CM

## 2023-04-21 NOTE — Telephone Encounter (Signed)
Prescription refill request received for warfarin Lov: 12/07/22 Kenneth Ryan)  Next INR check: 04/27/23 Warfarin tablet strength: 5mg   Appropriate dose. Refill sent.

## 2023-04-27 ENCOUNTER — Ambulatory Visit: Payer: PPO | Attending: Cardiology | Admitting: *Deleted

## 2023-04-27 DIAGNOSIS — Z9889 Other specified postprocedural states: Secondary | ICD-10-CM | POA: Diagnosis not present

## 2023-04-27 DIAGNOSIS — Q2543 Congenital aneurysm of aorta: Secondary | ICD-10-CM

## 2023-04-27 DIAGNOSIS — Z954 Presence of other heart-valve replacement: Secondary | ICD-10-CM

## 2023-04-27 DIAGNOSIS — I7121 Aneurysm of the ascending aorta, without rupture: Secondary | ICD-10-CM

## 2023-04-27 DIAGNOSIS — Z5181 Encounter for therapeutic drug level monitoring: Secondary | ICD-10-CM

## 2023-04-27 LAB — POCT INR: INR: 2.5 (ref 2.0–3.0)

## 2023-04-27 NOTE — Patient Instructions (Signed)
Description   Continue on same dosage of Warfarin 1/2 tablet daily except for 1 tablet on Sundays. Recheck INR in 5 weeks. Stay consistent with greens.  Coumadin Clinic [phone];

## 2023-04-30 ENCOUNTER — Other Ambulatory Visit: Payer: Self-pay | Admitting: Cardiology

## 2023-04-30 DIAGNOSIS — I420 Dilated cardiomyopathy: Secondary | ICD-10-CM

## 2023-04-30 DIAGNOSIS — Z954 Presence of other heart-valve replacement: Secondary | ICD-10-CM

## 2023-04-30 DIAGNOSIS — Z9889 Other specified postprocedural states: Secondary | ICD-10-CM

## 2023-04-30 DIAGNOSIS — Q2543 Congenital aneurysm of aorta: Secondary | ICD-10-CM

## 2023-04-30 DIAGNOSIS — I7121 Aneurysm of the ascending aorta, without rupture: Secondary | ICD-10-CM

## 2023-04-30 DIAGNOSIS — I10 Essential (primary) hypertension: Secondary | ICD-10-CM

## 2023-05-11 ENCOUNTER — Encounter: Payer: Self-pay | Admitting: Family Medicine

## 2023-05-11 ENCOUNTER — Other Ambulatory Visit (INDEPENDENT_AMBULATORY_CARE_PROVIDER_SITE_OTHER): Payer: PPO

## 2023-05-11 DIAGNOSIS — E781 Pure hyperglyceridemia: Secondary | ICD-10-CM

## 2023-05-11 LAB — LIPID PANEL
Cholesterol: 155 mg/dL (ref 0–200)
HDL: 35.7 mg/dL — ABNORMAL LOW (ref >39.00)
LDL Cholesterol: 92 mg/dL (ref 0–99)
NonHDL: 119.66
Total CHOL/HDL Ratio: 4
Triglycerides: 138 mg/dL (ref 0.0–149.0)
VLDL: 27.6 mg/dL (ref 0.0–40.0)

## 2023-05-14 ENCOUNTER — Other Ambulatory Visit: Payer: Self-pay | Admitting: Cardiology

## 2023-05-14 NOTE — Telephone Encounter (Signed)
Refilled protonix for 1 month. Patient has visit 05/18/23 w/ Dr. Anne Fu.

## 2023-05-14 NOTE — Telephone Encounter (Signed)
Pt. Has had this filled in the past by Dr. Anne Fu, then his PCP, then back to cardiology. Please advise.

## 2023-05-18 ENCOUNTER — Encounter: Payer: Self-pay | Admitting: Cardiology

## 2023-05-18 ENCOUNTER — Ambulatory Visit: Payer: PPO | Attending: Cardiology | Admitting: Cardiology

## 2023-05-18 DIAGNOSIS — I420 Dilated cardiomyopathy: Secondary | ICD-10-CM | POA: Diagnosis not present

## 2023-05-18 DIAGNOSIS — Z9889 Other specified postprocedural states: Secondary | ICD-10-CM | POA: Diagnosis not present

## 2023-05-18 DIAGNOSIS — Z954 Presence of other heart-valve replacement: Secondary | ICD-10-CM | POA: Diagnosis not present

## 2023-05-18 DIAGNOSIS — I7121 Aneurysm of the ascending aorta, without rupture: Secondary | ICD-10-CM | POA: Diagnosis not present

## 2023-05-18 DIAGNOSIS — I10 Essential (primary) hypertension: Secondary | ICD-10-CM | POA: Diagnosis not present

## 2023-05-18 MED ORDER — IRBESARTAN 300 MG PO TABS: 300.0000 mg | ORAL_TABLET | Freq: Every day | ORAL | 3 refills | Status: DC

## 2023-05-18 NOTE — Patient Instructions (Signed)
Medication Instructions:  The current medical regimen is effective;  continue present plan and medications.  *If you need a refill on your cardiac medications before your next appointment, please call your pharmacy*  Follow-Up: At [facility], you and your health needs are our priority.  As part of our continuing mission to provide you with exceptional heart care, we have created designated Provider Care Teams.  These Care Teams include your primary Cardiologist (physician) and Advanced Practice Providers (APPs -  Physician Assistants and Nurse Practitioners) who all work together to provide you with the care you need, when you need it.  We recommend signing up for the patient portal called "MyChart".  Sign up information is provided on this After Visit Summary.  MyChart is used to connect with patients for Virtual Visits (Telemedicine).  Patients are able to view lab/test results, encounter notes, upcoming appointments, etc.  Non-urgent messages can be sent to your provider as well.   To learn more about what you can do with MyChart, go to [email or web address].    Your next appointment:   1 year(s)  Provider:   Mark Skains, MD      

## 2023-05-18 NOTE — Progress Notes (Signed)
Cardiology Office Note:    Date:  05/18/2023   ID:  Kenneth Ryan, DOB 1946/05/09, MRN [id number]  PCP:  Jeoffrey Massed, MD   [facility] Providers Cardiologist:  Donato Schultz, MD     Referring MD: Jeoffrey Massed, MD    History of Present Illness:    Kenneth Ryan is a 77 y.o. male here for the follow-up of mechanical aortic valve, Bentall/aortic root replacement 11/03/2016 with mitral valve repair as well in the setting of bicuspid aortic valve.  He had severe aortic regurgitation at that time.  He also had severe mitral regurgitation as well.  Dr. Cornelius Moras.  Ejection fraction postoperatively was 35% then improved shortly thereafter to 45%.to 50-55%  Prostate CA. Had right brachial thrombus with embolectomy following protocol hold warfarin prior to prostate procedure 12/2022.  We will recommend from this point forward a Lovenox bridge prior to elective surgical procedures.  Overall doing fairly well.  Does feel fatigued.  Did have a mechanical fall, bruised underneath right rib.  No visible hematoma.  There is no instability in his chest wall.  Slowly improving.  Compliant with his medications.  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  Right +brach,rad,ulnar-->occured while he was briefly off coumadin-->thrombectomy by vasc surg   Chronic hoarseness    secondary to hx of laryngeal surgery   GERD (gastroesophageal reflux disease)    Barrett's esoph on EGD 06/06/21   History of basal cell carcinoma    nose   History of laryngeal cancer 2002   Surgery and radiation completed same year 2002---  oncologist released pt summer 2014   (benign bx by dr Riesa Pope in 10/ 2016)   History of lower GI bleeding 05/2021   s/p   EGD w/some non bleeding gastritis, Colonoscopy with int/ext hemorrhoids,  1 polyp   Hyperlipidemia, mixed    Malignant neoplasm prostate (HCC) 07/2022   urologist-- wrenn/  radiation oncologist--- dr Kathrynn Running;  dx 08/ 2023,  Gleason 3+4   Nonischemic cardiomyopathy (HCC) 08/2016   followed by cardiologist-  dr Anne Fu;    per echo ef 40-45%  09/ 2017;   echo post surgery in 2017  ef 35%;  improved 2019 ;  last echo 05-07-2022  ef 55-60%   Pre-diabetes    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  Per CV surg, as of 02/16/17, his INR goal is 1.5-2.5.   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   Severe Mitral regurg;   s/p  Complex valvuloplasty including artificial Gore-tex neochord placement x6 and 28 mm Sorin Memo 3D ring annuloplasty   SOB (shortness of breath) on exertion    normal activities ok but more strenous acitivity sob which is baseline per cardiologist note   Wears glasses     Past Surgical History:  Procedure Laterality Date   BENTALL PROCEDURE N/A 11/03/2016   Procedure: BENTALL PROCEDURE AORTIC ROOT REPLACEMENT + mechanical AV;  Surgeon: Purcell Nails, MD;  Location: [facility] OR;  Service: Open  Heart Surgery;  Laterality: N/A;   BIOPSY  06/05/2021   Procedure: BIOPSY;  Surgeon: Tressia Danas, MD;  Location: [facility] ENDOSCOPY;  Service: Gastroenterology;;   CARDIAC CATHETERIZATION N/A 09/29/2016   Clean coronaries, EF 60-65%.  Procedure: Right/Left Heart Cath and Coronary Angiography;  Surgeon: Corky Crafts, MD;  Location: [facility] INVASIVE CV LAB;  Service: Cardiovascular;  Laterality: N/A;   CHOLECYSTECTOMY  1980   COLONOSCOPY WITH PROPOFOL N/A 06/05/2021   diverticulosis descending and sigmoid, int+ext nonbleeding hemorr, 1 sessile seratted polyp w/out cytologic  dysplasia. Procedure: COLONOSCOPY WITH PROPOFOL;  Surgeon: Tressia Danas, MD;  Location: [facility];  Service: Gastroenterology;  Laterality: N/A;   DIRECT LARYNGOSCOPY Left 10/17/2015   Procedure: MICRO DIRECT LARYNGOSCOPY WITH FROZEN SECTION;  Surgeon: Flo Shanks, MD;  Location: [facility] OR;  Service: ENT;  Laterality: Left;   EMBOLECTOMY Right 12/27/2022   Procedure: RIGHT BRACHIAL, ULNAR, AND RADIAL ARTERY EMBOLECTOMY;  Surgeon: Victorino Sparrow, MD;  Location: [facility] OR;  Service: Vascular;  Laterality: Right;   ESOPHAGOGASTRODUODENOSCOPY (EGD) WITH PROPOFOL N/A 06/05/2021   Barrett's esoph.  Gastritis (h pylori neg), multiple benign gastric polyps. Procedure: ESOPHAGOGASTRODUODENOSCOPY (EGD) WITH PROPOFOL;  Surgeon: Tressia Danas, MD;  Location: [facility];  Service: Gastroenterology;  Laterality: N/A;   LARYNX SURGERY  2002   removal vocal cord tumor (cancer)   MITRAL VALVE REPAIR N/A 11/03/2016   Procedure: MITRAL VALVE REPAIR (MVR);  Surgeon: Purcell Nails, MD;  Location: [facility] OR;  Service: Open Heart Surgery;  Laterality: N/A;   PERIPHERAL VASCULAR CATHETERIZATION N/A 09/29/2016   Procedure: Abdominal Aortogram;  Surgeon: Corky Crafts, MD;  Location: [facility] INVASIVE CV LAB;  Service: Cardiovascular;  Laterality: N/A;   PERIPHERAL VASCULAR CATHETERIZATION N/A 09/29/2016   Procedure: Thoracic Aortogram;  Surgeon: Corky Crafts, MD;  Location: [facility] INVASIVE CV LAB;  Service: Cardiovascular;  Laterality: N/A;   POLYPECTOMY  06/05/2021   Procedure: POLYPECTOMY;  Surgeon: Tressia Danas, MD;  Location: [facility] ENDOSCOPY;  Service: Gastroenterology;;   RADIOACTIVE SEED IMPLANT N/A 12/25/2022   Procedure: RADIOACTIVE SEED IMPLANT/BRACHYTHERAPY IMPLANT;  Surgeon: Bjorn Pippin, MD;  Location: [facility];  Service: Urology;  Laterality: N/A;   SPACE OAR INSTILLATION N/A 12/25/2022   Procedure: SPACE OAR INSTILLATION;  Surgeon: Bjorn Pippin, MD;  Location: [facility];  Service: Urology;  Laterality: N/A;   TEE WITHOUT CARDIOVERSION N/A 09/17/2016   Procedure: TRANSESOPHAGEAL ECHOCARDIOGRAM (TEE);  Surgeon: Jake Bathe, MD;  Location: [facility] ENDOSCOPY;  Service: Cardiovascular;  Laterality: N/A;   TEE WITHOUT CARDIOVERSION N/A 11/03/2016   Procedure: TRANSESOPHAGEAL ECHOCARDIOGRAM (TEE);  Surgeon: Purcell Nails, MD;  Location: [facility] OR;  Service: Open Heart Surgery;  Laterality: N/A;   TONSILLECTOMY AND ADENOIDECTOMY     child   TRANSESOPHAGEAL ECHOCARDIOGRAM  09/17/2016   Severe MR with ruptured chordae.  Mod/severe AR w/functional bicuspid AV.  EF 45%.    Current Medications: Current Meds  Medication Sig   acetaminophen (TYLENOL) 500 MG tablet Take 500-1,000 mg by mouth every 6 (six) hours as needed for mild pain (depends on pain level if takes 500 mg or 1000 mg).   aspirin EC 81 MG EC tablet Take 1 tablet (81 mg total) by mouth daily.   fenofibrate (TRICOR) 145 MG tablet TAKE 1 TABLET BY MOUTH DAILY (Patient taking differently: Take 145 mg by mouth daily. TAKE 1 TABLET BY MOUTH DAILY)   furosemide (LASIX) 20 MG tablet Take 1 tablet (20 mg total) by mouth daily as needed for  fluid or edema (2 or more pounds).   metoprolol succinate (TOPROL-XL) 50 MG 24 hr tablet TAKE 1 TABLET BY MOUTH EVERY DAY (Patient taking differently: Take 50 mg by mouth daily.)   Multiple Vitamins-Minerals (MULTIVITAMIN PO) Take 1 tablet by mouth daily.    pantoprazole (PROTONIX) 40 MG tablet TAKE 1 TABLET BY MOUTH EVERY DAY   warfarin (COUMADIN) 5 MG tablet TAKE 1/2 TABLET TO 1 TABLET DAILY AS DIRECTED BY THE COUMADIN CLINIC.   [DISCONTINUED] irbesartan (AVAPRO) 300 MG tablet TAKE 1 TABLET BY MOUTH EVERY DAY     Allergies:   Patient has no known allergies.   Social History   Socioeconomic History   Marital status: Widowed    Spouse name: Not on file   Number of children: 1   Years of education: Not on file   Highest education level: Not on file  Occupational History    Occupation: retired  Tobacco Use   Smoking status: Former    Years: 22    Types: Cigarettes    Quit date: 07/21/1989    Years since quitting: 33.8   Smokeless tobacco: Never  Vaping Use   Vaping Use: Never used  Substance and Sexual Activity   Alcohol use: Yes    Comment: rare   Drug use: Never   Sexual activity: Not on file  Other Topics Concern   Not on file  Social History Narrative   Widower (approx 06-20-14), one son deceased June 21, 2011.   Relocated from [place] to [facility] 06/2013 to be near grandchildren.   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   Smoker 20 pack-yr hx: quit about 1980.   Alcohol: 3 bottles of beer per week avg.     Drugs: none         Social Determinants of Health   Financial Resource Strain: Low Risk  (04/14/2023)   Overall Financial Resource Strain (CARDIA)    Difficulty of Paying Living Expenses: Not hard at all  Food Insecurity: No Food Insecurity (04/14/2023)   Hunger Vital Sign    Worried About Running Out of Food in the Last Year: Never true    Ran Out of Food in the Last Year: Never true  Transportation Needs: No Transportation Needs (04/14/2023)   PRAPARE - Administrator, Civil Service (Medical): No    Lack of Transportation (Non-Medical): No  Physical Activity: Inactive (04/14/2023)   Exercise Vital Sign    Days of Exercise per Week: 0 days    Minutes of Exercise per Session: 0 min  Stress: No Stress Concern Present (04/14/2023)   [employer] of Occupational Health - Occupational Stress Questionnaire    Feeling of Stress : Not at all  Social Connections: Moderately Isolated (04/14/2023)   Social Connection and Isolation Panel [NHANES]    Frequency of Communication with Friends and Family: More than three times a week    Frequency of Social Gatherings with Friends and Family: Three times a week    Attends Religious Services: More than 4 times per year    Active Member of Clubs or Organizations: No    Attends Banker Meetings:  Never    Marital Status: Widowed     Family History: The patient's family history includes Breast cancer in his mother, sister, and sister; Diabetes in his brother; Heart disease in his father; Kidney failure in his son.  ROS:   Please see the history of present illness.     All other systems reviewed and are negative.  EKGs/Labs/Other Studies Reviewed:    The following studies were reviewed today: ECHO 06/08/17:   - Left ventricle: The cavity size was normal. Systolic function was   mildly reduced. The estimated ejection fraction was in the range   of 45% to 50%. Features are consistent with a pseudonormal left   ventricular filling pattern, with concomitant abnormal relaxation   and increased filling pressure (grade 2 diastolic dysfunction). - Aortic valve: A mechanical prosthesis was present. - Mitral valve: Prior procedures included surgical repair.  ECHO 11/28/20:   1. Left ventricular ejection fraction, by estimation, is 50 to 55%. The  left ventricle has low normal function. The left ventricle has no regional  wall motion abnormalities. The left ventricular internal cavity size was  mildly dilated. Left ventricular  diastolic parameters were normal.   2. Right ventricular systolic function is normal. The right ventricular  size is normal.   3. Left atrial size was moderately dilated.   4. The mitral valve is degenerative. No evidence of mitral valve  regurgitation. Mild mitral stenosis. Moderate mitral annular  calcification.   5. Post AVR mechanical bi leaflet stable gradients since 2018 would bring  patient back for further interrogation of color flow. See image 68 cannot  tell if moderate PVL vs mitral inflow similar appearance in 2018 PVL not  appreciated in PSL views but  prone to get shadowing artifact in these views . The aortic valve has been  repaired/replaced. Aortic valve regurgitation see below. No aortic  stenosis is present.   6. The inferior vena cava  is normal in size with greater than 50%  respiratory variability, suggesting right atrial pressure of 3 mmHg  Cardiac Studies & Procedures   CARDIAC CATHETERIZATION  CARDIAC CATHETERIZATION 09/29/2016  Narrative  The left ventricular ejection fraction is 55-65% by visual estimate. LV was visualized due to severe AI on supravalvular aortogram.  Hemodynamic findings consistent with mild pulmonary hypertension.  No significant CAD.  Prominent V waves consistent with severe mitral regurgitation.  No renal artery stenosis noted. No AAA.  There is severe (4+) aortic regurgitation.  Continue with plans for referral to Dr. Cornelius Moras for valve surgery.  Findings Coronary Findings Diagnostic  Dominance: Right  Left Anterior Descending The vessel exhibits minimal luminal irregularities.  Left Circumflex The vessel exhibits minimal luminal irregularities.  Intervention  No interventions have been documented.     ECHOCARDIOGRAM  ECHOCARDIOGRAM COMPLETE 12/29/2022  Narrative ECHOCARDIOGRAM REPORT    Patient Name:   Kenneth Ryan [facility] Date of Exam: 12/28/2022 Medical Rec #:  [id number]         Height:       72.0 in Accession #:    [id number]        Weight:       220.0 lb Date of Birth:  Aug 11, 1946         BSA:          2.219 m Patient Age:    76 years          BP:           129/73 mmHg Patient Gender: M                 HR:           80 bpm. Exam Location:  Inpatient  Procedure: 2D Echo  Indications:    status post aortic and mitral valve replacement  History:        Patient has prior history of Echocardiogram examinations, most  recent 05/07/2022. Cardiomyopathy, Bentall procedure; Risk Factors:Hypertension and Dyslipidemia. Aortic Valve: mechanical valve is present in the aortic position. Procedure Date: 11/03/16. Mitral Valve: prosthetic annuloplasty ring valve is present in the mitral position. Procedure Date: 11/03/16.  Sonographer:    Delcie Roch RDCS Referring  Phys: [id number] FANG XU   Sonographer Comments: Suboptimal subcostal window. IMPRESSIONS   1. Left ventricular ejection fraction, by estimation, is 55 to 60%. The left ventricle has normal function. The left ventricle has no regional wall motion abnormalities. Left ventricular diastolic parameters are consistent with Grade I diastolic dysfunction (impaired relaxation). 2. Right ventricular systolic function is normal. The right ventricular size is normal. There is normal pulmonary artery systolic pressure. The estimated right ventricular systolic pressure is 21.5 mmHg. 3. The mitral valve has been repaired/replaced. No evidence of mitral valve regurgitation. No evidence of mitral stenosis. The mean mitral valve gradient is 6.0 mmHg. There is a prosthetic annuloplasty ring present in the mitral position. Procedure Date: 11/03/16. Echo findings are consistent with normal structure and function of the mitral valve prosthesis. 4. The aortic valve has been repaired/replaced. Aortic valve regurgitation is not visualized. No aortic stenosis is present. There is a mechanical valve present in the aortic position. Procedure Date: 11/03/16. Echo findings are consistent with normal structure and function of the aortic valve prosthesis. 5. Aortic root/ascending aorta has been repaired/replaced. 6. The inferior vena cava is normal in size with greater than 50% respiratory variability, suggesting right atrial pressure of 3 mmHg.  Comparison(s): No significant change from prior study. Prior images reviewed side by side.  FINDINGS Left Ventricle: Left ventricular ejection fraction, by estimation, is 55 to 60%. The left ventricle has normal function. The left ventricle has no regional wall motion abnormalities. The left ventricular internal cavity size was normal in size. There is no left ventricular hypertrophy. Left ventricular diastolic parameters are consistent with Grade I diastolic dysfunction (impaired  relaxation).  Right Ventricle: The right ventricular size is normal. No increase in right ventricular wall thickness. Right ventricular systolic function is normal. There is normal pulmonary artery systolic pressure. The tricuspid regurgitant velocity is 2.15 m/s, and with an assumed right atrial pressure of 3 mmHg, the estimated right ventricular systolic pressure is 21.5 mmHg.  Left Atrium: Left atrial size was normal in size.  Right Atrium: Right atrial size was normal in size.  Pericardium: There is no evidence of pericardial effusion.  Mitral Valve: The mitral valve has been repaired/replaced. No evidence of mitral valve regurgitation. There is a prosthetic annuloplasty ring present in the mitral position. Procedure Date: 11/03/16. Echo findings are consistent with normal structure and function of the mitral valve prosthesis. No evidence of mitral valve stenosis. MV peak gradient, 11.0 mmHg. The mean mitral valve gradient is 6.0 mmHg.  Tricuspid Valve: The tricuspid valve is normal in structure. Tricuspid valve regurgitation is trivial. No evidence of tricuspid stenosis.  Aortic Valve: The aortic valve has been repaired/replaced. Aortic valve regurgitation is not visualized. No aortic stenosis is present. Aortic valve mean gradient measures 8.0 mmHg. Aortic valve peak gradient measures 15.8 mmHg. There is a mechanical valve present in the aortic position. Procedure Date: 11/03/16. Echo findings are consistent with normal structure and function of the aortic valve prosthesis.  Pulmonic Valve: The pulmonic valve was normal in structure. Pulmonic valve regurgitation is not visualized. No evidence of pulmonic stenosis.  Aorta: The aortic root/ascending aorta has been repaired/replaced.  Venous: The inferior vena cava is normal in size with greater than  50% respiratory variability, suggesting right atrial pressure of 3 mmHg.  IAS/Shunts: No atrial level shunt detected by color flow  Doppler.   LEFT VENTRICLE PLAX 2D LVIDd:         5.30 cm LVIDs:         3.80 cm LV PW:         0.80 cm LV IVS:        0.90 cm   RIGHT VENTRICLE RV S prime:     13.50 cm/s TAPSE (M-mode): 2.2 cm  LEFT ATRIUM             Index        RIGHT ATRIUM           Index LA diam:        5.30 cm 2.39 cm/m   RA Area:     16.60 cm LA Vol (A2C):   57.9 ml 26.10 ml/m  RA Volume:   42.10 ml  18.98 ml/m LA Vol (A4C):   60.6 ml 27.31 ml/m LA Biplane Vol: 62.2 ml 28.04 ml/m AORTIC VALVE AV Vmax:           199.00 cm/s AV Vmean:          129.000 cm/s AV VTI:            0.345 m AV Peak Grad:      15.8 mmHg AV Mean Grad:      8.0 mmHg LVOT Vmax:         153.00 cm/s LVOT Vmean:        99.050 cm/s LVOT VTI:          0.289 m LVOT/AV VTI ratio: 0.84  AORTA Ao Asc diam: 3.40 cm  MITRAL VALVE                TRICUSPID VALVE MV Area (PHT): 2.66 cm     TR Peak grad:   18.5 mmHg MV Peak grad:  11.0 mmHg    TR Vmax:        215.00 cm/s MV Mean grad:  6.0 mmHg MV Vmax:       1.66 m/s     SHUNTS MV Vmean:      113.0 cm/s   Systemic VTI: 0.29 m MV Decel Time: 285 msec MV E velocity: 115.00 cm/s MV A velocity: 130.00 cm/s MV E/A ratio:  0.88  Donato Schultz MD Electronically signed by Donato Schultz MD Signature Date/Time: 12/28/2022/4:41:24 PM    Final   TEE  ECHO TEE 11/04/2016  Interpretation Summary  Left ventricle: Cavity is moderately dilated. End diastolic diameter of 6 cm. Concentric hypertrophy. 1.2cm inferior base thickness. LV systolic function is mildly to moderately reduced with an EF of 40-45%. There are no obvious wall motion abnormalities. No thrombus present. No mass present.  Septum: No Patent Foramen Ovale present.  Left atrium: Patent foramen ovale not present.  Left atrium: No spontaneous echo contrast.  Aortic valve: The valve is bicuspid and has systolic doming. Moderate valve thickening present. Moderate valve calcification present. Mildly decreased leaflet  separation. Mild stenosis. Severe regurgitation. Holodiastolic flow reversal in the descending thoracic aorta. No AV vegetation.  Aorta: The aortic root is dilated at the sinuses of Valsalva. The ascending aorta is dilated.  Mitral valve: The mitral annulus is dilated. The A2 segment is flail with connected ruptured chordae from the tip of the papillary muscle. No leaflet thickening and calcification present. Ruptured chordae causing anterior leaflet incompetence. Severe regurgitation. Flail portion of the  anterior leaflet involving the middle segment.  Right ventricle: Normal cavity size, wall thickness and ejection fraction.  Tricuspid valve: Trace regurgitation. The tricuspid valve regurgitation jet is central.  Mitral valve: Mild stenosis.  Aorta: Graft present.    CT SCANS  CT CORONARY MORPH W/CTA COR W/SCORE 09/23/2015  Addendum 09/23/2015  5:00 PM ADDENDUM REPORT: 09/23/2015 16:58 CLINICAL DATA:  ?  Bicuspid Aortic Valve and Aortic Root Enlargement EXAM: Retrospective Gated Cardiac CT TECHNIQUE: The patient was scanned on a Philips 256 scanner. A 120 kV retrospective scan was triggered in the descending thoracic aorta at 111 HU's. Gantry rotation speed was 270 msecs and collimation was .9 mm. No beta blockade or nitro were given. The 3D data set was reconstructed in 5% intervals of the R-R cycle. Systolic and diastolic phases were analyzed on a dedicated work station using MPR, MIP and VRT modes. The patient received 80 cc of contrast. FINDINGS: Aortic Valve: Appears trileaflet with partial fusion of the left and right cusps. (appeared to be 3 sinuses). Leaflets calcified but valve area by planimetry over 3cm2 indicating no significant stenosis Aorta: Moderate to severe ascending aortic root dilatation. Normal origin of the arch vessels No coarctation Sinotubular Junction:  37 mm Ascending Thoracic Aorta:  48 mm Aortic Arch:  35 mm Descending Thoracic Aorta:  28  mm Sinus of Valsalva Measurements: Non-coronary:  42 mm Right coronary: 40 mm Left coronary: 39 mm Calcium Score:  Calcium noted in the proximal LAD and RCA. Coronary Arteries: Right dominant. Non obstructive calcified disease in proximal RCA and LAD IMPRESSION: 1) Calcium Score 201 which is 63rd percentile for age and sex matched controls 2) No obstructive CAD. Right dominant. Less than 30% calcified disease in proximal LAD and RCA 3) Moderate to severe Ascending aortic root enlargement 48 mm. 4) Dilated non coronary of Valsalva 42 mm 5) Normal origin of the great vessels with no coarctation. 6) Aortic valve calcified but not stenotic Appears trileaflet with partial fusion of the left and right coronary cusps Charlton Haws Electronically Signed By: Charlton Haws M.D. On: 09/23/2015 16:58  Narrative EXAM: OVER-READ INTERPRETATION  CT CHEST  The following report is an over-read performed by radiologist Dr. Jarrett Ables [facility] on 09/23/2015. This over-read does not include interpretation of cardiac or coronary anatomy or pathology. The coronary CTA interpretation by the cardiologist is attached.  COMPARISON:  02/18/2015  FINDINGS: Mild bilateral diffuse airway thickening without significant airway nodularity. Subsegmental atelectasis in the lingula. Trace bilateral pleural effusions.  Right lower paratracheal lymph node measures up to 1.5 cm in short axis but has a fatty hilum. Right paratracheal node on image 41 series 501 measures 1.2 cm.  Ascending thoracic aorta measures up to 4.7 cm diameter.  Small type 1 hiatal hernia. Distal paraesophageal lymph nodes measure 6 mm in diameter.  IMPRESSION: 1. Mild bilateral diffuse airway thickening without significant nodularity, possibly from reactive airways disease or bronchitis, less likely interstitial edema. 2. Trace bilateral pleural effusions. 3. Enlarged right paratracheal lymph nodes,  possibly from passive congestion, reactive adenopathy, or less likely malignancy. The largest is a right lower paratracheal lymph node measuring 1.5 cm in short axis (but retaining a fatty hilum). 4. Ascending thoracic aortic aneurysm. Recommend semi-annual imaging followup by CTA or MRA and referral to cardiothoracic surgery if not already obtained. This recommendation follows 2010 ACCF/AHA/AATS/ACR/ASA/SCA/SCAI/SIR/STS/SVM Guidelines for the Diagnosis and Management of Patients With Thoracic Aortic Disease. Circulation. 2010; 121: [id number] 5. Small type 1 hiatal hernia.  Electronically Signed: By: Gaylyn Rong M.D. On: 09/23/2015 11:05          EKG:  05/18/2023-sinus rhythm 68 PVCs inferior infarct pattern Prior NSR 61   Recent Labs: 12/27/2022: ALT 23 12/30/2022: BUN 17; Creatinine, Ser 1.04; Hemoglobin 12.8; Magnesium 2.0; Platelets 152; Potassium 4.3; Sodium 138  Recent Lipid Panel    Component Value Date/Time   CHOL 155 05/11/2023 0823   TRIG 138.0 05/11/2023 0823   HDL 35.70 (L) 05/11/2023 0823   CHOLHDL 4 05/11/2023 0823   VLDL 27.6 05/11/2023 0823   LDLCALC 92 05/11/2023 0823     Risk Assessment/Calculations:              Physical Exam:    VS:  BP 126/64   Ht 6' (1.829 m)   Wt 228 lb (103.4 kg)   BMI 30.92 kg/m     Wt Readings from Last 3 Encounters:  05/18/23 228 lb (103.4 kg)  04/14/23 223 lb (101.2 kg)  01/22/23 223 lb 4.8 oz (101.3 kg)     GEN:  Well nourished, well developed in no acute distress HEENT: Normal NECK: No JVD; No carotid bruits LYMPHATICS: No lymphadenopathy CARDIAC: RRR, S1 click, no murmurs, no rubs, gallops RESPIRATORY:  Clear to auscultation without rales, wheezing or rhonchi  ABDOMEN: Soft, non-tender, non-distended MUSCULOSKELETAL:  No edema; No deformity  SKIN: Warm and dry NEUROLOGIC:  Alert and oriented x 3 PSYCHIATRIC:  Normal affect   ASSESSMENT:    1. Dilated cardiomyopathy (HCC)   2. S/P mitral valve  repair   3. S/P aortic valve replacement with metallic valve   4. Aortic root aneurysm (HCC)   5. Essential hypertension     PLAN:    In order of problems listed above:   S/P Bentall aortic root replacement AVR and mitral valve repair On-X bileaflet mechanical valve (INR 2-2.5), Bentall procedure, mitral valve repair 2017 -Dr. Cornelius Moras.  Had reimplantation of left main and right coronary arteries.  Overall stable doing well.  Continue with aspirin, warfarin.  Dilated cardiomyopathy (HCC) Ejection fraction originally 35%, improved to 45%.  Eventually improved to 50 to 55%.  Echocardiogram reviewed. With his mechanical valve, continue to monitor echocardiogram.  Chronic anticoagulation Mechanical aortic valve, new or addition that allows for INR of 2-2.5.  Continue to monitor closely in Coumadin clinic.  Recent reference values reviewed.  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 Sherral Hammers - embolectomy. Occurred following coumadin held for prostate treatment.  Prostate cancer can be a hypercoagulable state, also has mechanical aortic valve. From this point forward, we will recommend a Lovenox bridge prior to any elective surgery.  Mixed hyperlipidemia On fenofibrate 145.  Lipid panel reviewed as above.  Well-controlled.  Primary hypertension On irbesartan 300 mg a day, Toprol 50.  Lasix 20 as needed.  Fatigue Encourage continued movement, exercise.  Previous lab work unremarkable.     Prostate CA Seeds      Medication Adjustments/Labs and Tests Ordered: Current medicines are reviewed at length with the patient today.  Concerns regarding medicines are outlined above.  Orders Placed This Encounter  Procedures   EKG 12-Lead   Meds ordered this encounter  Medications   irbesartan (AVAPRO) 300 MG tablet    Sig: Take 1 tablet (300 mg total) by mouth daily.    Dispense:  90 tablet    Refill:  3     Patient Instructions  Medication Instructions:  The current medical  regimen is effective;  continue present plan and medications.  *If you need a refill on your cardiac medications before your next appointment, please call your pharmacy*  Follow-Up: At [facility], you and your health needs are our priority.  As part of our continuing mission to provide you with exceptional heart care, we have created designated Provider Care Teams.  These Care Teams include your primary Cardiologist (physician) and Advanced Practice Providers (APPs -  Physician Assistants and Nurse Practitioners) who all work together to provide you with the care you need, when you need it.  We recommend signing up for the patient portal called "MyChart".  Sign up information is provided on this After Visit Summary.  MyChart is used to connect with patients for Virtual Visits (Telemedicine).  Patients are able to view lab/test results, encounter notes, upcoming appointments, etc.  Non-urgent messages can be sent to your provider as well.   To learn more about what you can do with MyChart, go to [email or web address].    Your next appointment:   1 year(s)  Provider:   Donato Schultz, MD       Signed, Donato Schultz, MD  05/18/2023 9:35 AM    [facility]

## 2023-06-01 ENCOUNTER — Ambulatory Visit: Payer: PPO | Attending: Cardiology

## 2023-06-01 DIAGNOSIS — Z5181 Encounter for therapeutic drug level monitoring: Secondary | ICD-10-CM

## 2023-06-01 DIAGNOSIS — Z954 Presence of other heart-valve replacement: Secondary | ICD-10-CM | POA: Diagnosis not present

## 2023-06-01 DIAGNOSIS — Z9889 Other specified postprocedural states: Secondary | ICD-10-CM | POA: Diagnosis not present

## 2023-06-01 DIAGNOSIS — I7121 Aneurysm of the ascending aorta, without rupture: Secondary | ICD-10-CM

## 2023-06-01 LAB — POCT INR: INR: 3 (ref 2.0–3.0)

## 2023-06-01 NOTE — Patient Instructions (Signed)
HOLD TOMORROW ONLY THEN Continue on same dosage of Warfarin 1/2 tablet daily except for 1 tablet on Sundays. Recheck INR in 5 weeks. Stay consistent with greens.  Coumadin Clinic [phone];

## 2023-06-10 ENCOUNTER — Other Ambulatory Visit: Payer: Self-pay | Admitting: Cardiology

## 2023-06-10 NOTE — Telephone Encounter (Signed)
Pt's pharmacy is requesting a refill on pantoprazole. Would Dr. Skains like to refill this medication? Please address 

## 2023-06-13 ENCOUNTER — Other Ambulatory Visit: Payer: Self-pay | Admitting: Cardiology

## 2023-06-13 DIAGNOSIS — Z954 Presence of other heart-valve replacement: Secondary | ICD-10-CM

## 2023-06-13 DIAGNOSIS — I7121 Aneurysm of the ascending aorta, without rupture: Secondary | ICD-10-CM

## 2023-06-13 DIAGNOSIS — I420 Dilated cardiomyopathy: Secondary | ICD-10-CM

## 2023-06-13 DIAGNOSIS — I10 Essential (primary) hypertension: Secondary | ICD-10-CM

## 2023-06-13 DIAGNOSIS — Z9889 Other specified postprocedural states: Secondary | ICD-10-CM

## 2023-07-06 ENCOUNTER — Ambulatory Visit: Payer: PPO | Attending: Cardiology

## 2023-07-06 DIAGNOSIS — Z9889 Other specified postprocedural states: Secondary | ICD-10-CM | POA: Diagnosis not present

## 2023-07-06 DIAGNOSIS — I7121 Aneurysm of the ascending aorta, without rupture: Secondary | ICD-10-CM

## 2023-07-06 DIAGNOSIS — Z954 Presence of other heart-valve replacement: Secondary | ICD-10-CM

## 2023-07-06 DIAGNOSIS — Z5181 Encounter for therapeutic drug level monitoring: Secondary | ICD-10-CM | POA: Diagnosis not present

## 2023-07-06 LAB — POCT INR: INR: 2.4 (ref 2.0–3.0)

## 2023-07-06 NOTE — Patient Instructions (Signed)
Continue on same dosage of Warfarin 1/2 tablet daily except for 1 tablet on Sundays. Recheck INR in 6 weeks. Stay consistent with greens.  Coumadin Clinic [phone];

## 2023-07-20 ENCOUNTER — Other Ambulatory Visit: Payer: Self-pay | Admitting: Cardiology

## 2023-07-20 DIAGNOSIS — Z9889 Other specified postprocedural states: Secondary | ICD-10-CM

## 2023-07-20 DIAGNOSIS — Z954 Presence of other heart-valve replacement: Secondary | ICD-10-CM

## 2023-08-17 ENCOUNTER — Ambulatory Visit: Payer: PPO | Attending: Cardiovascular Disease

## 2023-08-17 DIAGNOSIS — I7121 Aneurysm of the ascending aorta, without rupture: Secondary | ICD-10-CM

## 2023-08-17 DIAGNOSIS — Z5181 Encounter for therapeutic drug level monitoring: Secondary | ICD-10-CM

## 2023-08-17 DIAGNOSIS — Z9889 Other specified postprocedural states: Secondary | ICD-10-CM

## 2023-08-17 DIAGNOSIS — Z954 Presence of other heart-valve replacement: Secondary | ICD-10-CM

## 2023-08-17 LAB — POCT INR: INR: 3 (ref 2.0–3.0)

## 2023-08-17 NOTE — Patient Instructions (Signed)
HOLD TOMORROW ONLY THEN Continue on same dosage of Warfarin 1/2 tablet daily except for 1 tablet on Sundays. Recheck INR in 6 weeks. Stay consistent with greens.  Coumadin Clinic [phone];

## 2023-09-13 ENCOUNTER — Other Ambulatory Visit: Payer: Self-pay | Admitting: Cardiology

## 2023-09-28 ENCOUNTER — Ambulatory Visit: Payer: PPO | Attending: Cardiology | Admitting: *Deleted

## 2023-09-28 DIAGNOSIS — Z5181 Encounter for therapeutic drug level monitoring: Secondary | ICD-10-CM | POA: Diagnosis not present

## 2023-09-28 DIAGNOSIS — Q2543 Congenital aneurysm of aorta: Secondary | ICD-10-CM

## 2023-09-28 DIAGNOSIS — Z9889 Other specified postprocedural states: Secondary | ICD-10-CM | POA: Diagnosis not present

## 2023-09-28 DIAGNOSIS — Z954 Presence of other heart-valve replacement: Secondary | ICD-10-CM | POA: Diagnosis not present

## 2023-09-28 LAB — POCT INR: INR: 1.9 — AB (ref 2.0–3.0)

## 2023-09-28 NOTE — Patient Instructions (Signed)
Description   Today take 1 tablet of warfarin THEN Continue on same dosage of Warfarin 1/2 tablet daily except for 1 tablet on Sundays. Recheck INR in 5 weeks. Stay consistent with greens.  Coumadin Clinic [phone];

## 2023-10-13 DIAGNOSIS — C61 Malignant neoplasm of prostate: Secondary | ICD-10-CM | POA: Diagnosis not present

## 2023-10-16 ENCOUNTER — Other Ambulatory Visit: Payer: Self-pay | Admitting: Cardiology

## 2023-10-16 DIAGNOSIS — Z9889 Other specified postprocedural states: Secondary | ICD-10-CM

## 2023-10-16 DIAGNOSIS — Z954 Presence of other heart-valve replacement: Secondary | ICD-10-CM

## 2023-10-20 DIAGNOSIS — N5201 Erectile dysfunction due to arterial insufficiency: Secondary | ICD-10-CM | POA: Diagnosis not present

## 2023-10-20 DIAGNOSIS — C61 Malignant neoplasm of prostate: Secondary | ICD-10-CM | POA: Diagnosis not present

## 2023-10-20 DIAGNOSIS — N401 Enlarged prostate with lower urinary tract symptoms: Secondary | ICD-10-CM | POA: Diagnosis not present

## 2023-10-20 DIAGNOSIS — R3914 Feeling of incomplete bladder emptying: Secondary | ICD-10-CM | POA: Diagnosis not present

## 2023-11-02 ENCOUNTER — Ambulatory Visit: Payer: PPO | Attending: Cardiology | Admitting: *Deleted

## 2023-11-02 DIAGNOSIS — Z5181 Encounter for therapeutic drug level monitoring: Secondary | ICD-10-CM | POA: Diagnosis not present

## 2023-11-02 DIAGNOSIS — Q2543 Congenital aneurysm of aorta: Secondary | ICD-10-CM

## 2023-11-02 DIAGNOSIS — Z9889 Other specified postprocedural states: Secondary | ICD-10-CM

## 2023-11-02 DIAGNOSIS — Z954 Presence of other heart-valve replacement: Secondary | ICD-10-CM | POA: Diagnosis not present

## 2023-11-02 LAB — POCT INR: INR: 3.3 — AB (ref 2.0–3.0)

## 2023-11-02 NOTE — Patient Instructions (Signed)
Description   Do not take any warfarin today THEN Continue on same dosage of Warfarin 1/2 tablet daily except for 1 tablet on Sundays. Recheck INR in 5 weeks. Stay consistent with greens.  Coumadin Clinic [phone];

## 2023-11-11 ENCOUNTER — Encounter: Payer: Self-pay | Admitting: Family Medicine

## 2023-11-11 NOTE — Progress Notes (Signed)
Office Note 11/12/2023  CC:  Chief Complaint  Patient presents with   Annual Exam    Pt is fasting.     HPI:  Patient is a 77 y.o. male who is here for annual health maintenance exam and f/u HTN, hypertriglyceridemia, and IFG.  Kenneth Ryan is feeling well. He is on e-Harmony and enjoying this. He is active and eats a good diet. No home blood pressure monitoring.  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  Right +brach,rad,ulnar-->occured while he was briefly off coumadin-->thrombectomy by vasc surg   Chronic hoarseness    secondary to hx of laryngeal surgery   GERD (gastroesophageal reflux disease)    Barrett's esoph on EGD 06/06/21   History of arterial thrombosis    brachial,radial,ulnar 12/2022 when off coumadin for brachytherapy--->embolectomy-->ASA +coumadin indef   History of basal cell carcinoma    nose   History of laryngeal cancer 2002   Surgery and radiation completed same year 2002---  oncologist released pt summer 2014   (benign bx by dr Riesa Pope in 10/ 2016)   History of lower GI bleeding 05/2021   s/p   EGD w/some non bleeding gastritis, Colonoscopy with int/ext hemorrhoids, 1 polyp   Hyperlipidemia, mixed    Malignant neoplasm prostate (HCC) 07/2022   urologist-- wrenn/  radiation oncologist--- dr Kathrynn Running;  dx 08/ 2023,  Gleason 3+4   Nonischemic cardiomyopathy (HCC) 08/2016   followed by cardiologist-  dr Anne Fu;    per echo ef 40-45%  09/ 2017;   echo post surgery in 2017  ef 35%;  improved 2019 ;  last echo 05-07-2022  ef 55-60%   Pre-diabetes    S/P Bentall aortic root replacement with bileaflet mechanical prosthetic valve conduit 11/03/2016   known bicupsid AV w/ severe stenosis & aneurysm;     s/p  Size 27/29 mm On-X bileaflet mechanical valve and synthetic root conduit  (coumadin and ASA 81mg  as per cardiology rec's) with reimplantation of left main and right coronary arteries.  Per CV surg, as of 02/16/17, his INR goal is 1.5-2.5.   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   Severe Mitral regurg;   s/p  Complex valvuloplasty including artificial Gore-tex neochord placement x6 and 28 mm Sorin Memo 3D ring annuloplasty   SOB (shortness of breath) on exertion    normal activities ok but more strenous acitivity sob which is baseline per cardiologist note   Wears glasses     Past Surgical History:  Procedure Laterality Date   BENTALL PROCEDURE N/A 11/03/2016   Procedure: BENTALL PROCEDURE AORTIC ROOT REPLACEMENT + mechanical AV;  Surgeon: Purcell Nails, MD;  Location: [facility] OR;  Service: Open Heart Surgery;  Laterality: N/A;   BIOPSY  06/05/2021   Procedure: BIOPSY;  Surgeon: Tressia Danas, MD;  Location: [facility] ENDOSCOPY;  Service: Gastroenterology;;   CARDIAC CATHETERIZATION N/A 09/29/2016   Clean coronaries, EF 60-65%.  Procedure: Right/Left Heart Cath and Coronary Angiography;  Surgeon: Corky Crafts, MD;  Location: [facility] INVASIVE CV LAB;  Service: Cardiovascular;  Laterality: N/A;   CHOLECYSTECTOMY  1980   COLONOSCOPY WITH PROPOFOL N/A 06/05/2021   diverticulosis descending and sigmoid, int+ext nonbleeding hemorr, 1 sessile seratted polyp w/out cytologic dysplasia. Procedure: COLONOSCOPY WITH PROPOFOL;  Surgeon: Tressia Danas, MD;  Location: [facility]OSCOPY;  Service:  Gastroenterology;  Laterality: N/A;   DIRECT LARYNGOSCOPY Left 10/17/2015   Procedure: MICRO DIRECT LARYNGOSCOPY WITH FROZEN SECTION;  Surgeon: Flo Shanks, MD;  Location: [facility] OR;  Service: ENT;  Laterality: Left;   EMBOLECTOMY Right 12/27/2022   Procedure: RIGHT BRACHIAL, ULNAR, AND RADIAL ARTERY EMBOLECTOMY;  Surgeon: Victorino Sparrow, MD;  Location: [facility] OR;  Service: Vascular;  Laterality: Right;   ESOPHAGOGASTRODUODENOSCOPY (EGD) WITH PROPOFOL N/A 06/05/2021   Barrett's esoph.  Gastritis (h  pylori neg), multiple benign gastric polyps. Procedure: ESOPHAGOGASTRODUODENOSCOPY (EGD) WITH PROPOFOL;  Surgeon: Tressia Danas, MD;  Location: [facility]OSCOPY;  Service: Gastroenterology;  Laterality: N/A;   LARYNX SURGERY  01-Dec-2001   removal vocal cord tumor (cancer)   MITRAL VALVE REPAIR N/A 11/03/2016   Procedure: MITRAL VALVE REPAIR (MVR);  Surgeon: Purcell Nails, MD;  Location: [facility] OR;  Service: Open Heart Surgery;  Laterality: N/A;   PERIPHERAL VASCULAR CATHETERIZATION N/A 09/29/2016   Procedure: Abdominal Aortogram;  Surgeon: Corky Crafts, MD;  Location: [facility] INVASIVE CV LAB;  Service: Cardiovascular;  Laterality: N/A;   PERIPHERAL VASCULAR CATHETERIZATION N/A 09/29/2016   Procedure: Thoracic Aortogram;  Surgeon: Corky Crafts, MD;  Location: [facility] INVASIVE CV LAB;  Service: Cardiovascular;  Laterality: N/A;   POLYPECTOMY  06/05/2021   Procedure: POLYPECTOMY;  Surgeon: Tressia Danas, MD;  Location: [facility] ENDOSCOPY;  Service: Gastroenterology;;   RADIOACTIVE SEED IMPLANT N/A 12/25/2022   Procedure: RADIOACTIVE SEED IMPLANT/BRACHYTHERAPY IMPLANT;  Surgeon: Bjorn Pippin, MD;  Location: [facility];  Service: Urology;  Laterality: N/A;   SPACE OAR INSTILLATION N/A 12/25/2022   Procedure: SPACE OAR INSTILLATION;  Surgeon: Bjorn Pippin, MD;  Location: [facility];  Service: Urology;  Laterality: N/A;   TEE WITHOUT CARDIOVERSION N/A 09/17/2016   Procedure: TRANSESOPHAGEAL ECHOCARDIOGRAM (TEE);  Surgeon: Jake Bathe, MD;  Location: [facility] ENDOSCOPY;  Service: Cardiovascular;  Laterality: N/A;   TEE WITHOUT CARDIOVERSION N/A 11/03/2016   Procedure: TRANSESOPHAGEAL ECHOCARDIOGRAM (TEE);  Surgeon: Purcell Nails, MD;  Location: [facility] OR;  Service: Open Heart Surgery;  Laterality: N/A;   TONSILLECTOMY AND ADENOIDECTOMY     child   TRANSESOPHAGEAL ECHOCARDIOGRAM  09/17/2016   Severe MR with ruptured chordae.  Mod/severe AR w/functional bicuspid AV.  EF 45%.    Family  History  Problem Relation Age of Onset   Heart disease Father    Breast cancer Mother    Breast cancer Sister    Diabetes Brother    Kidney failure Son    Breast cancer Sister     Social History   Socioeconomic History   Marital status: Widowed    Spouse name: Not on file   Number of children: 1   Years of education: Not on file   Highest education level: Not on file  Occupational History   Occupation: retired  Tobacco Use   Smoking status: Former    Current packs/day: 0.00    Types: Cigarettes    Start date: 07/22/1967    Quit date: 07/21/1989    Years since quitting: 34.3   Smokeless tobacco: Never  Vaping Use   Vaping status: Never Used  Substance and Sexual Activity   Alcohol use: Yes    Comment: rare   Drug use: Never   Sexual activity: Not on file  Other Topics Concern   Not on file  Social History Narrative   Widower (approx 12/01/14), one son deceased 12-02-11.   Relocated from [place] to [facility] 06/2013 to be near grandchildren.   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   Smoker 20 pack-yr hx: quit about 1980.   Alcohol: 3 bottles of beer per week avg.     Drugs: none         Social Determinants of Health   Financial Resource Strain: Low Risk  (04/14/2023)   Overall Financial Resource Strain (CARDIA)    Difficulty of Paying Living Expenses: Not hard at all  Food Insecurity: No Food Insecurity (04/14/2023)   Hunger Vital Sign    Worried About Running Out of Food in the Last Year: Never true    Ran Out of Food in the Last Year: Never true  Transportation Needs: No Transportation Needs (04/14/2023)   PRAPARE - Administrator, Civil Service (Medical): No    Lack of Transportation (Non-Medical): No  Physical Activity: Inactive (04/14/2023)   Exercise Vital Sign    Days of Exercise per Week: 0 days    Minutes of Exercise per Session: 0 min  Stress: No Stress Concern Present (04/14/2023)   [employer] of Occupational Health - Occupational Stress Questionnaire     Feeling of Stress : Not at all  Social Connections: Moderately Isolated (04/14/2023)   Social Connection and Isolation Panel [NHANES]    Frequency of Communication with Friends and Family: More than three times a week    Frequency of Social Gatherings with Friends and Family: Three times a week    Attends Religious Services: More than 4 times per year    Active Member of Clubs or Organizations: No    Attends Banker Meetings: Never    Marital Status: Widowed  Intimate Partner Violence: Not At Risk (04/14/2023)   Humiliation, Afraid, Rape, and Kick questionnaire    Fear of Current or Ex-Partner: No    Emotionally Abused: No    Physically Abused: No    Sexually Abused: No    Outpatient Medications Prior to Visit  Medication Sig Dispense Refill   acetaminophen (TYLENOL) 500 MG tablet Take 500-1,000 mg by mouth every 6 (six) hours as needed for mild pain (depends on pain level if takes 500 mg or 1000 mg).     aspirin EC 81 MG EC tablet Take 1 tablet (81 mg total) by mouth daily.     irbesartan (AVAPRO) 300 MG tablet Take 1 tablet (300 mg total) by mouth daily. 90 tablet 3   metoprolol succinate (TOPROL-XL) 50 MG 24 hr tablet TAKE 1 TABLET BY MOUTH EVERY DAY 90 tablet 3   Multiple Vitamins-Minerals (MULTIVITAMIN PO) Take 1 tablet by mouth daily.      pantoprazole (PROTONIX) 40 MG tablet TAKE 1 TABLET BY MOUTH EVERY DAY 90 tablet 1   warfarin (COUMADIN) 5 MG tablet TAKE 1/2 TABLET TO 1 TABLET DAILY AS DIRECTED BY THE COUMADIN CLINIC. 55 tablet 0   furosemide (LASIX) 20 MG tablet Take 1 tablet (20 mg total) by mouth daily as needed for fluid or edema (2 or more pounds). (Patient not taking: Reported on 11/12/2023) 30 tablet 1   fenofibrate (TRICOR) 145 MG tablet TAKE 1 TABLET BY MOUTH DAILY (Patient taking differently: Take 145 mg by mouth daily. TAKE 1 TABLET BY MOUTH DAILY) 90 tablet 3   No facility-administered medications prior to visit.    No Known Allergies  Review of  Systems  Constitutional:  Negative for appetite change, chills, fatigue and fever.  HENT:  Negative for congestion, dental problem, ear pain and sore throat.   Eyes:  Negative for discharge, redness and visual disturbance.  Respiratory:  Negative for cough, chest tightness, shortness of breath and wheezing.   Cardiovascular:  Negative for chest pain, palpitations and leg swelling.  Gastrointestinal:  Negative for abdominal pain, blood in stool, diarrhea, nausea and vomiting.  Genitourinary:  Negative for difficulty urinating, dysuria, flank pain, frequency, hematuria and urgency.  Musculoskeletal:  Negative for arthralgias, back pain, joint swelling, myalgias and neck stiffness.  Skin:  Negative for pallor and rash.  Neurological:  Negative for dizziness, speech difficulty, weakness and headaches.  Hematological:  Negative for adenopathy. Does not bruise/bleed easily.  Psychiatric/Behavioral:  Negative for confusion and sleep disturbance. The patient is not nervous/anxious.     PE;    11/12/2023    8:00 AM 05/18/2023    9:02 AM 04/14/2023    1:29 PM  Vitals with BMI  Height 6\' 0"  6\' 0"    Weight 220 lbs 13 oz 228 lbs 223 lbs  BMI 29.94 30.92   Systolic 130 126   Diastolic 85 64   Pulse 62      Gen: Alert, well appearing.  Patient is oriented to person, place, time, and situation. AFFECT: pleasant, lucid thought and speech. ENT: Ears: EACs clear, normal epithelium.  TMs with good light reflex and landmarks bilaterally.  Eyes: no injection, icteris, swelling, or exudate.  EOMI, PERRLA. Nose: no drainage or turbinate edema/swelling.  No injection or focal lesion.  Mouth: lips without lesion/swelling.  Oral mucosa pink and moist.  Dentition intact and without obvious caries or gingival swelling.  Oropharynx without erythema, exudate, or swelling.  Neck: supple/nontender.  No LAD, mass, or TM.  Carotid pulses 2+ bilaterally, without bruits. CV: RRR, no m/r/g.   LUNGS: CTA bilat,  nonlabored resps, good aeration in all lung fields. ABD: soft, NT, ND, BS normal.  No hepatospenomegaly or mass.  No bruits. EXT: no clubbing, cyanosis, or edema.  Musculoskeletal: no joint swelling, erythema, warmth, or tenderness.  ROM of all joints intact. Skin -many scattered light brown flat papules that appear slightly waxy and stuck on.  Pertinent labs:  Lab Results  Component Value Date   TSH 2.700 04/20/2022   Lab Results  Component Value Date   WBC 7.2 12/30/2022   HGB 12.8 (L) 12/30/2022   HCT 39.3 12/30/2022   MCV 89.9 12/30/2022   PLT 152 12/30/2022   Lab Results  Component Value Date   CREATININE 1.04 12/30/2022   BUN 17 12/30/2022   NA 138 12/30/2022   K 4.3 12/30/2022   CL 102 12/30/2022   CO2 28 12/30/2022   Lab Results  Component Value Date   ALT 23 12/27/2022   AST 31 12/27/2022   ALKPHOS 38 12/27/2022   BILITOT 0.9 12/27/2022   Lab Results  Component Value Date   CHOL 155 05/11/2023   Lab Results  Component Value Date   HDL 35.70 (L) 05/11/2023   Lab Results  Component Value Date   LDLCALC 92 05/11/2023   Lab Results  Component Value Date   TRIG 138.0 05/11/2023   Lab Results  Component Value Date   CHOLHDL 4 05/11/2023   Lab Results  Component Value Date   PSA 4.49 11/09/2022   PSA 4.87 04/29/2022   PSA 4.58 10/20/2021   PSA 4.58 10/20/2021   Lab Results  Component Value Date   INR 3.3 (A) 11/02/2023   INR 1.9 (A) 09/28/2023   INR 3.0 08/17/2023   Lab Results  Component Value Date   HGBA1C 5.8 11/09/2022   ASSESSMENT AND PLAN:   #  1 health maintenance exam: Reviewed age and gender appropriate health maintenance issues (prudent diet, regular exercise, health risks of tobacco and excessive alcohol, use of seatbelts, fire alarms in home, use of sunscreen).  Also reviewed age and gender appropriate health screening as well as vaccine recommendations. Vaccines: All up-to-date Labs: CBC, c-Met, lipids, hemoglobin A1c  (IFG). Prostate ca screening: PSAs followed by Dr. Annabell Howells Colon ca screening: Colonoscopy 05/2021 with polyp x1.  Anticipate recall 5 years.  #2 hypertriglyceridemia, has been doing well long-term on fenofibrate 145 mg a day. Lipid panel and hepatic panel today.  3.  Hypertension, well-controlled on the irbesartan 300 mg a day and Toprol-XL 50 mg a day. Electrolytes and creatinine today.  #4 skin lesions.  Appear benign, likely seborrheic keratoses. Patient would like many of these excised, particularly the ones on his neck. Refer to dermatology.  An After Visit Summary was printed and given to the patient.  FOLLOW UP:  Return in about 1 year (around 11/11/2024) for annual CPE (fasting).  Signed:  Santiago Bumpers, MD           11/12/2023

## 2023-11-12 ENCOUNTER — Encounter: Payer: Self-pay | Admitting: Family Medicine

## 2023-11-12 ENCOUNTER — Ambulatory Visit (INDEPENDENT_AMBULATORY_CARE_PROVIDER_SITE_OTHER): Payer: PPO | Admitting: Family Medicine

## 2023-11-12 VITALS — BP 130/85 | HR 62 | Ht 72.0 in | Wt 220.8 lb

## 2023-11-12 DIAGNOSIS — E781 Pure hyperglyceridemia: Secondary | ICD-10-CM | POA: Diagnosis not present

## 2023-11-12 DIAGNOSIS — R7301 Impaired fasting glucose: Secondary | ICD-10-CM

## 2023-11-12 DIAGNOSIS — Z0000 Encounter for general adult medical examination without abnormal findings: Secondary | ICD-10-CM | POA: Diagnosis not present

## 2023-11-12 DIAGNOSIS — Z1283 Encounter for screening for malignant neoplasm of skin: Secondary | ICD-10-CM | POA: Diagnosis not present

## 2023-11-12 DIAGNOSIS — L989 Disorder of the skin and subcutaneous tissue, unspecified: Secondary | ICD-10-CM

## 2023-11-12 DIAGNOSIS — I10 Essential (primary) hypertension: Secondary | ICD-10-CM

## 2023-11-12 DIAGNOSIS — Z125 Encounter for screening for malignant neoplasm of prostate: Secondary | ICD-10-CM | POA: Diagnosis not present

## 2023-11-12 LAB — CBC
HCT: 46.7 % (ref 39.0–52.0)
Hemoglobin: 15.1 g/dL (ref 13.0–17.0)
MCHC: 32.3 g/dL (ref 30.0–36.0)
MCV: 89.9 fL (ref 78.0–100.0)
Platelets: 186 10*3/uL (ref 150.0–400.0)
RBC: 5.19 Mil/uL (ref 4.22–5.81)
RDW: 14.9 % (ref 11.5–15.5)
WBC: 5 10*3/uL (ref 4.0–10.5)

## 2023-11-12 LAB — COMPREHENSIVE METABOLIC PANEL
ALT: 17 U/L (ref 0–53)
AST: 27 U/L (ref 0–37)
Albumin: 4 g/dL (ref 3.5–5.2)
Alkaline Phosphatase: 50 U/L (ref 39–117)
BUN: 13 mg/dL (ref 6–23)
CO2: 33 meq/L — ABNORMAL HIGH (ref 19–32)
Calcium: 9.5 mg/dL (ref 8.4–10.5)
Chloride: 103 meq/L (ref 96–112)
Creatinine, Ser: 1.13 mg/dL (ref 0.40–1.50)
GFR: 63.59 mL/min (ref >60.00)
Glucose, Bld: 96 mg/dL (ref 70–99)
Potassium: 4.3 meq/L (ref 3.5–5.1)
Sodium: 141 meq/L (ref 135–145)
Total Bilirubin: 0.8 mg/dL (ref 0.2–1.2)
Total Protein: 6.4 g/dL (ref 6.0–8.3)

## 2023-11-12 LAB — LIPID PANEL
Cholesterol: 165 mg/dL (ref 0–200)
HDL: 33.6 mg/dL — ABNORMAL LOW (ref >39.00)
LDL Cholesterol: 104 mg/dL — ABNORMAL HIGH (ref 0–99)
NonHDL: 131.42
Total CHOL/HDL Ratio: 5
Triglycerides: 136 mg/dL (ref 0.0–149.0)
VLDL: 27.2 mg/dL (ref 0.0–40.0)

## 2023-11-12 LAB — HEMOGLOBIN A1C: Hgb A1c MFr Bld: 5.8 % (ref 4.6–6.5)

## 2023-11-12 MED ORDER — FENOFIBRATE 145 MG PO TABS: 145.0000 mg | ORAL_TABLET | Freq: Every day | ORAL | 1 refills | Status: DC

## 2023-12-07 ENCOUNTER — Ambulatory Visit: Payer: PPO | Attending: Cardiology | Admitting: *Deleted

## 2023-12-07 DIAGNOSIS — Z954 Presence of other heart-valve replacement: Secondary | ICD-10-CM | POA: Diagnosis not present

## 2023-12-07 DIAGNOSIS — Z5181 Encounter for therapeutic drug level monitoring: Secondary | ICD-10-CM

## 2023-12-07 DIAGNOSIS — Z9889 Other specified postprocedural states: Secondary | ICD-10-CM | POA: Diagnosis not present

## 2023-12-07 DIAGNOSIS — Q2543 Congenital aneurysm of aorta: Secondary | ICD-10-CM

## 2023-12-07 LAB — POCT INR: INR: 2.7 (ref 2.0–3.0)

## 2023-12-07 NOTE — Patient Instructions (Signed)
Description   Do not take any warfarin tomorrow THEN continue on same dosage of Warfarin 1/2 tablet daily except for 1 tablet on Sundays. Recheck INR in 4 weeks. Add another leafy veggie to your diet and stay consistent with greens.  Coumadin Clinic [phone];

## 2024-01-04 ENCOUNTER — Ambulatory Visit: Payer: PPO | Attending: Cardiology | Admitting: *Deleted

## 2024-01-04 DIAGNOSIS — Q2543 Congenital aneurysm of aorta: Secondary | ICD-10-CM

## 2024-01-04 DIAGNOSIS — Z954 Presence of other heart-valve replacement: Secondary | ICD-10-CM

## 2024-01-04 DIAGNOSIS — Z5181 Encounter for therapeutic drug level monitoring: Secondary | ICD-10-CM | POA: Diagnosis not present

## 2024-01-04 DIAGNOSIS — Z9889 Other specified postprocedural states: Secondary | ICD-10-CM

## 2024-01-04 LAB — POCT INR: INR: 2.1 (ref 2.0–3.0)

## 2024-01-04 NOTE — Patient Instructions (Signed)
 Description   Continue taking Warfarin 1/2 tablet daily except for 1 tablet on Sundays. Recheck INR in 5 weeks. Add another leafy veggie to your diet and stay consistent with greens.  Coumadin Clinic [phone];

## 2024-01-13 ENCOUNTER — Encounter: Payer: Self-pay | Admitting: Dermatology

## 2024-01-13 ENCOUNTER — Ambulatory Visit: Payer: PPO | Admitting: Dermatology

## 2024-01-13 VITALS — BP 124/76 | HR 63

## 2024-01-13 DIAGNOSIS — L821 Other seborrheic keratosis: Secondary | ICD-10-CM

## 2024-01-13 DIAGNOSIS — D485 Neoplasm of uncertain behavior of skin: Secondary | ICD-10-CM

## 2024-01-13 DIAGNOSIS — Z5111 Encounter for antineoplastic chemotherapy: Secondary | ICD-10-CM | POA: Diagnosis not present

## 2024-01-13 DIAGNOSIS — D492 Neoplasm of unspecified behavior of bone, soft tissue, and skin: Secondary | ICD-10-CM | POA: Diagnosis not present

## 2024-01-13 DIAGNOSIS — L570 Actinic keratosis: Secondary | ICD-10-CM

## 2024-01-13 DIAGNOSIS — Z85828 Personal history of other malignant neoplasm of skin: Secondary | ICD-10-CM

## 2024-01-13 DIAGNOSIS — W908XXA Exposure to other nonionizing radiation, initial encounter: Secondary | ICD-10-CM | POA: Diagnosis not present

## 2024-01-13 DIAGNOSIS — D235 Other benign neoplasm of skin of trunk: Secondary | ICD-10-CM

## 2024-01-13 MED ORDER — FLUOROURACIL 5 % EX CREA: TOPICAL_CREAM | Freq: Two times a day (BID) | CUTANEOUS | 1 refills | Status: AC

## 2024-01-13 NOTE — Patient Instructions (Addendum)
- Start 5-fluorouracil cream twice a day for 14 days to affected areas including forehead, temples, cheeks and nose. Patient Handout: Wound Care for Skin Biopsy Site  Taking Care of Your Skin Biopsy Site  Proper care of the biopsy site is essential for promoting healing and minimizing scarring. This handout provides instructions on how to care for your biopsy site to ensure optimal recovery.  1. Cleaning the Wound:  Clean the biopsy site daily with gentle soap and water. Gently pat the area dry with a clean, soft towel. Avoid harsh scrubbing or rubbing the area, as this can irritate the skin and delay healing.  2. Applying Aquaphor and Bandage:  After cleaning the wound, apply a thin layer of Aquaphor ointment to the biopsy site. Cover the area with a sterile bandage to protect it from dirt, bacteria, and friction. Change the bandage daily or as needed if it becomes soiled or wet.  3. Continued Care for One Week:  Repeat the cleaning, Aquaphor application, and bandaging process daily for one week following the biopsy procedure. Keeping the wound clean and moist during this initial healing period will help prevent infection and promote optimal healing.  4. Massaging Aquaphor into the Area:  ---After one week, discontinue the use of bandages but continue to apply Aquaphor to the biopsy site. ----Gently massage the Aquaphor into the area using circular motions. ---Massaging the skin helps to promote circulation and prevent the formation of scar tissue.   Additional Tips:  Avoid exposing the biopsy site to direct sunlight during the healing process, as this can cause hyperpigmentation or worsen scarring. If you experience any signs of infection, such as increased redness, swelling, warmth, or drainage from the wound, contact your healthcare provider immediately. Follow any additional instructions provided by your healthcare provider for caring for the biopsy site and managing any  discomfort. Conclusion:  Taking proper care of your skin biopsy site is crucial for ensuring optimal healing and minimizing scarring. By following these instructions for cleaning, applying Aquaphor, and massaging the area, you can promote a smooth and successful recovery. If you have any questions or concerns about caring for your biopsy site, don't hesitate to contact your healthcare provider for guidance.  .  Reviewed course of treatment and expected reaction.  Patient advised to expect inflammation and crusting and advised that erosions are possible.  Patient advised to be diligent with sun protection during and after treatment. Handout with details of how to apply medication and what to expect provided. Counseled to keep medication out of reach of children and pets.  Important Information  Due to recent changes in healthcare laws, you may see results of your pathology and/or laboratory studies on MyChart before the doctors have had a chance to review them. We understand that in some cases there may be results that are confusing or concerning to you. Please understand that not all results are received at the same time and often the doctors may need to interpret multiple results in order to provide you with the best plan of care or course of treatment. Therefore, we ask that you please give [place] 2 business days to thoroughly review all your results before contacting the office for clarification. Should we see a critical lab result, you will be contacted sooner.   If You Need Anything After Your Visit  If you have any questions or concerns for your doctor, please call our main line at [phone] If no one answers, please leave a voicemail as directed and  we will return your call as soon as possible. Messages left after 4 pm will be answered the following business day.   You may also send [place] a message via MyChart. We typically respond to MyChart messages within 1-2 business days.  For prescription  refills, please ask your pharmacy to contact our office. Our fax number is [phone].  If you have an urgent issue when the clinic is closed that cannot wait until the next business day, you can page your doctor at the number below.    Please note that while we do our best to be available for urgent issues outside of office hours, we are not available 24/7.   If you have an urgent issue and are unable to reach [place], you may choose to seek medical care at your doctor's office, retail clinic, urgent care center, or emergency room.  If you have a medical emergency, please immediately call 911 or go to the emergency department. In the event of inclement weather, please call our main line at [phone] for an update on the status of any delays or closures.  Dermatology Medication Tips: Please keep the boxes that topical medications come in in order to help keep track of the instructions about where and how to use these. Pharmacies typically print the medication instructions only on the boxes and not directly on the medication tubes.   If your medication is too expensive, please contact our office at [phone] or send [place] a message through MyChart.   We are unable to tell what your co-pay for medications will be in advance as this is different depending on your insurance coverage. However, we may be able to find a substitute medication at lower cost or fill out paperwork to get insurance to cover a needed medication.   If a prior authorization is required to get your medication covered by your insurance company, please allow [place] 1-2 business days to complete this process.  Drug prices often vary depending on where the prescription is filled and some pharmacies may offer cheaper prices.  The website www.goodrx.com contains coupons for medications through different pharmacies. The prices here do not account for what the cost may be with help from insurance (it may be cheaper with your insurance),  but the website can give you the price if you did not use any insurance.  - You can print the associated coupon and take it with your prescription to the pharmacy.  - You may also stop by our office during regular business hours and pick up a GoodRx coupon card.  - If you need your prescription sent electronically to a different pharmacy, notify our office through [facility] or by phone at [phone]    Skin Education :   I counseled the patient regarding the following: Sun screen (SPF 30 or greater) should be applied during peak UV exposure (between 10am and 2pm) and reapplied after exercise or swimming.  The ABCDEs of melanoma were reviewed with the patient, and the importance of monthly self-examination of moles was emphasized. Should any moles change in shape or color, or itch, bleed or burn, pt will contact our office for evaluation sooner then their interval appointment.  Plan: Sunscreen Recommendations I recommended a broad spectrum sunscreen with a SPF of 30 or higher. I explained that SPF 30 sunscreens block approximately 97 percent of the sun's harmful rays. Sunscreens should be applied at least 15 minutes prior to expected sun exposure and then every 2 hours after that as  long as sun exposure continues. If swimming or exercising sunscreen should be reapplied every 45 minutes to an hour after getting wet or sweating. One ounce, or the equivalent of a shot glass full of sunscreen, is adequate to protect the skin not covered by a bathing suit. I also recommended a lip balm with a sunscreen as well. Sun protective clothing can be used in lieu of sunscreen but must be worn the entire time you are exposed to the sun's rays.

## 2024-01-13 NOTE — Progress Notes (Signed)
New Patient Visit   Subjective  Kenneth Ryan is a 78 y.o. male who presents for the following: Spots of concern. HX of BCC.  Several brown scaly lesions of concern on the face, neck and shoulders, present for years, not previously growing or changing.   The patient has spots, moles and lesions to be evaluated, some may be new or changing.  The following portions of the chart were reviewed this encounter and updated as appropriate: medications, allergies, medical history  Review of Systems:  No other skin or systemic complaints except as noted in HPI or Assessment and Plan.  Objective  Well appearing patient in no apparent distress; mood and affect are within normal limits.   A focused examination was performed of the following areas:  Neck Trunk Abdomen Chest Face  Relevant exam findings are noted in the Assessment and Plan.    Assessment & Plan   SEBORRHEIC KERATOSIS - Stuck-on, waxy, tan-brown papules and/or plaques  - Benign-appearing - Discussed benign etiology and prognosis. - Observe - Call for any changes  ACTINIC KERATOSIS Exam: Erythematous thin papules/macules with gritty scale at the face, nose, templs  Actinic keratoses are precancerous spots that appear secondary to cumulative UV radiation exposure/sun exposure over time. They are chronic with expected duration over 1 year. A portion of actinic keratoses will progress to squamous cell carcinoma of the skin. It is not possible to reliably predict which spots will progress to skin cancer and so treatment is recommended to prevent development of skin cancer.  Recommend daily broad spectrum sunscreen SPF 30+ to sun-exposed areas, reapply every 2 hours as needed.  Recommend staying in the shade or wearing long sleeves, sun glasses (UVA+UVB protection) and wide brim hats (4-inch brim around the entire circumference of the hat). Call for new or changing lesions.  Treatment Plan: Start 5-fluorouracil cream  twice a day for 14 days to affected areas including neck, face including nose and forehead.  Reviewed course of treatment and expected reaction.  Patient advised to expect inflammation and crusting and advised that erosions are possible.  Patient advised to be diligent with sun protection during and after treatment. Handout with details of how to apply medication and what to expect provided. Counseled to keep medication out of reach of children and pets.  Reviewed course of treatment and expected reaction.  Patient advised to expect inflammation and crusting and advised that erosions are possible.  Patient advised to be diligent with sun protection during and after treatment. Handout with details of how to apply medication and what to expect provided. Counseled to keep medication out of reach of children and pets.  AK (ACTINIC KERATOSIS) (3) Head - Anterior (Face) (2), Nose fluorouracil (EFUDEX) 5 % cream - Head - Anterior (Face) Apply topically 2 (two) times daily for 14 days. Apply 2x per day to temples, forehead, cheeks and nose for 2 wks NEOPLASM OF UNCERTAIN BEHAVIOR OF SKIN Left Upper Back Skin / nail biopsy Type of biopsy: tangential   Informed consent: discussed and consent obtained   Timeout: patient name, date of birth, surgical site, and procedure verified   Procedure prep:  Patient was prepped and draped in usual sterile fashion Prep type:  Isopropyl alcohol Anesthesia: the lesion was anesthetized in a standard fashion   Anesthetic:  1% lidocaine w/ epinephrine 1-100,000 buffered w/ 8.4% NaHCO3 Instrument used: DermaBlade   Hemostasis achieved with: aluminum chloride   Outcome: patient tolerated procedure well   Post-procedure details: sterile dressing applied  and wound care instructions given   Dressing type: petrolatum and bandage   Specimen 1 - Surgical pathology Differential Diagnosis: r/o DN vs mm  Check Margins: No  Return in about 8 weeks (around 03/09/2024) for f/u for  efudex treatment.  Dominga Ferry, Surg Tech III, am acting as scribe for Gwenith Daily, MD.   Documentation: I have reviewed the above documentation for accuracy and completeness, and I agree with the above.  Gwenith Daily, MD

## 2024-01-14 LAB — SURGICAL PATHOLOGY

## 2024-01-15 ENCOUNTER — Other Ambulatory Visit: Payer: Self-pay | Admitting: Cardiology

## 2024-01-15 DIAGNOSIS — Z9889 Other specified postprocedural states: Secondary | ICD-10-CM

## 2024-01-15 DIAGNOSIS — Z954 Presence of other heart-valve replacement: Secondary | ICD-10-CM

## 2024-02-08 ENCOUNTER — Ambulatory Visit: Payer: PPO | Attending: Cardiovascular Disease | Admitting: *Deleted

## 2024-02-08 DIAGNOSIS — Z954 Presence of other heart-valve replacement: Secondary | ICD-10-CM | POA: Diagnosis not present

## 2024-02-08 DIAGNOSIS — Z9889 Other specified postprocedural states: Secondary | ICD-10-CM

## 2024-02-08 DIAGNOSIS — Q2543 Congenital aneurysm of aorta: Secondary | ICD-10-CM

## 2024-02-08 DIAGNOSIS — Z5181 Encounter for therapeutic drug level monitoring: Secondary | ICD-10-CM

## 2024-02-08 LAB — POCT INR: INR: 1.8 — AB (ref 2.0–3.0)

## 2024-02-08 NOTE — Patient Instructions (Addendum)
Description   Today take 1 tablet of warfarin then continue taking Warfarin 1/2 tablet daily except for 1 tablet on Sundays. Recheck INR in 5 weeks. Stay consistent with greens.  [facility] [phone];

## 2024-03-07 ENCOUNTER — Other Ambulatory Visit: Payer: Self-pay | Admitting: Cardiology

## 2024-03-09 ENCOUNTER — Encounter: Payer: Self-pay | Admitting: Dermatology

## 2024-03-09 ENCOUNTER — Ambulatory Visit: Payer: PPO | Admitting: Dermatology

## 2024-03-09 VITALS — BP 93/60 | HR 66

## 2024-03-09 DIAGNOSIS — D234 Other benign neoplasm of skin of scalp and neck: Secondary | ICD-10-CM

## 2024-03-09 DIAGNOSIS — W908XXA Exposure to other nonionizing radiation, initial encounter: Secondary | ICD-10-CM | POA: Diagnosis not present

## 2024-03-09 DIAGNOSIS — D485 Neoplasm of uncertain behavior of skin: Secondary | ICD-10-CM

## 2024-03-09 DIAGNOSIS — L570 Actinic keratosis: Secondary | ICD-10-CM

## 2024-03-09 DIAGNOSIS — D492 Neoplasm of unspecified behavior of bone, soft tissue, and skin: Secondary | ICD-10-CM | POA: Diagnosis not present

## 2024-03-09 NOTE — Progress Notes (Signed)
 Follow-Up Visit   Subjective  Kenneth Ryan is a 78 y.o. male who presents for the following: Follow up following efudex treatment for AKs. He used the efudex on his face and nose, and had a great reaction.   The patient has spots, moles and lesions to be evaluated, some may be new or changing and the patient may have concern these could be cancer.   The following portions of the chart were reviewed this encounter and updated as appropriate: medications, allergies, medical history  Review of Systems:  No other skin or systemic complaints except as noted in HPI or Assessment and Plan.  Objective  Well appearing patient in no apparent distress; mood and affect are within normal limits.   A focused examination was performed of the following areas:  Face  Relevant exam findings are noted in the Assessment and Plan.  Left Postauricular Area 1.0 cm pink plaque with rolled borders   Assessment & Plan   ACTINIC KERATOSIS Exam: Erythematous thin papules/macules with gritty scale, some improvement on upper face s/p topical 5FU  Actinic keratoses are precancerous spots that appear secondary to cumulative UV radiation exposure/sun exposure over time. They are chronic with expected duration over 1 year. A portion of actinic keratoses will progress to squamous cell carcinoma of the skin. It is not possible to reliably predict which spots will progress to skin cancer and so treatment is recommended to prevent development of skin cancer.  Recommend daily broad spectrum sunscreen SPF 30+ to sun-exposed areas, reapply every 2 hours as needed.  Recommend staying in the shade or wearing long sleeves, sun glasses (UVA+UVB protection) and wide brim hats (4-inch brim around the entire circumference of the hat). Call for new or changing lesions.  Treatment Plan:  Prior to procedure, discussed risks of blister formation, small wound, skin dyspigmentation, or rare scar following cryotherapy.  Recommend Vaseline ointment to treated areas while healing.  AK (ACTINIC KERATOSIS) (4) Head - Anterior (Face) (2), Nose, Right Parotid Area Patient reacted well to the efudex treatment. At treatment goal. Destruction of lesion - Right Parotid Area Complexity: simple   Destruction method: cryotherapy   Timeout:  patient name, date of birth, surgical site, and procedure verified Lesion destroyed using liquid nitrogen: Yes   Region frozen until ice ball extended beyond lesion: Yes   Cryotherapy cycles:  2 Outcome: patient tolerated procedure well with no complications   Post-procedure details: wound care instructions given   NEOPLASM OF UNCERTAIN BEHAVIOR OF SKIN Left Postauricular Area Skin / nail biopsy Type of biopsy: tangential   Informed consent: discussed and consent obtained   Timeout: patient name, date of birth, surgical site, and procedure verified   Procedure prep:  Patient was prepped and draped in usual sterile fashion Prep type:  Isopropyl alcohol Anesthesia: the lesion was anesthetized in a standard fashion   Anesthetic:  1% lidocaine w/ epinephrine 1-100,000 buffered w/ 8.4% NaHCO3 Instrument used: DermaBlade   Hemostasis achieved with: aluminum chloride   Outcome: patient tolerated procedure well   Post-procedure details: sterile dressing applied and wound care instructions given   Dressing type: bandage and petrolatum   Specimen 1 - Surgical pathology Differential Diagnosis: r/o BCC vs other  Check Margins: No  Return in about 6 months (around 09/09/2024) for UBSC.  I, Manual Meier, Surg Tech III, am acting as scribe for Gwenith Daily, MD.   Documentation: I have reviewed the above documentation for accuracy and completeness, and I agree with the above.  Gwenith Daily, MD

## 2024-03-09 NOTE — Patient Instructions (Addendum)
 Important Information  Due to recent changes in healthcare laws, you may see results of your pathology and/or laboratory studies on MyChart before the doctors have had a chance to review them. We understand that in some cases there may be results that are confusing or concerning to you. Please understand that not all results are received at the same time and often the doctors may need to interpret multiple results in order to provide you with the best plan of care or course of treatment. Therefore, we ask that you please give [place] 2 business days to thoroughly review all your results before contacting the office for clarification. Should we see a critical lab result, you will be contacted sooner.   If You Need Anything After Your Visit  If you have any questions or concerns for your doctor, please call our main line at [phone] If no one answers, please leave a voicemail as directed and we will return your call as soon as possible. Messages left after 4 pm will be answered the following business day.   You may also send [place] a message via MyChart. We typically respond to MyChart messages within 1-2 business days.  For prescription refills, please ask your pharmacy to contact our office. Our fax number is [phone].  If you have an urgent issue when the clinic is closed that cannot wait until the next business day, you can page your doctor at the number below.    Please note that while we do our best to be available for urgent issues outside of office hours, we are not available 24/7.   If you have an urgent issue and are unable to reach [place], you may choose to seek medical care at your doctor's office, retail clinic, urgent care center, or emergency room.  If you have a medical emergency, please immediately call 911 or go to the emergency department. In the event of inclement weather, please call our main line at [phone] for an update on the status of any delays or  closures.  Dermatology Medication Tips: Please keep the boxes that topical medications come in in order to help keep track of the instructions about where and how to use these. Pharmacies typically print the medication instructions only on the boxes and not directly on the medication tubes.   If your medication is too expensive, please contact our office at [phone] or send [place] a message through MyChart.   We are unable to tell what your co-pay for medications will be in advance as this is different depending on your insurance coverage. However, we may be able to find a substitute medication at lower cost or fill out paperwork to get insurance to cover a needed medication.   If a prior authorization is required to get your medication covered by your insurance company, please allow [place] 1-2 business days to complete this process.  Drug prices often vary depending on where the prescription is filled and some pharmacies may offer cheaper prices.  The website www.goodrx.com contains coupons for medications through different pharmacies. The prices here do not account for what the cost may be with help from insurance (it may be cheaper with your insurance), but the website can give you the price if you did not use any insurance.  - You can print the associated coupon and take it with your prescription to the pharmacy.  - You may also stop by our office during regular business hours and pick up a GoodRx coupon card.  - If  you need your prescription sent electronically to a different pharmacy, notify our office through [facility] or by phone at [phone]     Patient Handout: Wound Care for Skin Biopsy Site  Taking Care of Your Skin Biopsy Site  Proper care of the biopsy site is essential for promoting healing and minimizing scarring. This handout provides instructions on how to care for your biopsy site to ensure optimal recovery.  1. Cleaning the Wound:  Clean the biopsy site daily  with gentle soap and water. Gently pat the area dry with a clean, soft towel. Avoid harsh scrubbing or rubbing the area, as this can irritate the skin and delay healing.  2. Applying Aquaphor and Bandage:  After cleaning the wound, apply a thin layer of Aquaphor ointment to the biopsy site. Cover the area with a sterile bandage to protect it from dirt, bacteria, and friction. Change the bandage daily or as needed if it becomes soiled or wet.  3. Continued Care for One Week:  Repeat the cleaning, Aquaphor application, and bandaging process daily for one week following the biopsy procedure. Keeping the wound clean and moist during this initial healing period will help prevent infection and promote optimal healing.  4. Massaging Aquaphor into the Area:  ---After one week, discontinue the use of bandages but continue to apply Aquaphor to the biopsy site. ----Gently massage the Aquaphor into the area using circular motions. ---Massaging the skin helps to promote circulation and prevent the formation of scar tissue.   Additional Tips:  Avoid exposing the biopsy site to direct sunlight during the healing process, as this can cause hyperpigmentation or worsen scarring. If you experience any signs of infection, such as increased redness, swelling, warmth, or drainage from the wound, contact your healthcare provider immediately. Follow any additional instructions provided by your healthcare provider for caring for the biopsy site and managing any discomfort. Conclusion:  Taking proper care of your skin biopsy site is crucial for ensuring optimal healing and minimizing scarring. By following these instructions for cleaning, applying Aquaphor, and massaging the area, you can promote a smooth and successful recovery. If you have any questions or concerns about caring for your biopsy site, don't hesitate to contact your healthcare provider for guidance.  Liquid nitrogen was applied for 10-12 seconds  to the skin lesion and the expected blistering or scabbing reaction explained. Do not pick at the area. Patient reminded to expect hypopigmented scars from the procedure. Return if lesion fails to fully resolve. For areas treated with Liquid Nitrogen:  Keep clean with soap and water.  Apply Vaseline or Aquaphor twice daily.

## 2024-03-10 LAB — SURGICAL PATHOLOGY

## 2024-03-14 ENCOUNTER — Ambulatory Visit: Payer: PPO | Attending: Cardiology | Admitting: *Deleted

## 2024-03-14 DIAGNOSIS — Z5181 Encounter for therapeutic drug level monitoring: Secondary | ICD-10-CM | POA: Diagnosis not present

## 2024-03-14 DIAGNOSIS — Q2543 Congenital aneurysm of aorta: Secondary | ICD-10-CM

## 2024-03-14 DIAGNOSIS — Z954 Presence of other heart-valve replacement: Secondary | ICD-10-CM | POA: Diagnosis not present

## 2024-03-14 DIAGNOSIS — Z9889 Other specified postprocedural states: Secondary | ICD-10-CM | POA: Diagnosis not present

## 2024-03-14 LAB — POCT INR: INR: 2.3 (ref 2.0–3.0)

## 2024-03-14 NOTE — Patient Instructions (Addendum)
 Description   Continue taking Warfarin 1/2 tablet daily except for 1 tablet on Sundays. Recheck INR in 6 weeks. Stay consistent with greens.  [facility] [phone];

## 2024-04-06 DIAGNOSIS — C61 Malignant neoplasm of prostate: Secondary | ICD-10-CM | POA: Diagnosis not present

## 2024-04-12 ENCOUNTER — Other Ambulatory Visit: Payer: Self-pay | Admitting: Cardiology

## 2024-04-12 ENCOUNTER — Other Ambulatory Visit: Payer: Self-pay | Admitting: Family Medicine

## 2024-04-12 DIAGNOSIS — Z954 Presence of other heart-valve replacement: Secondary | ICD-10-CM

## 2024-04-12 DIAGNOSIS — Z9889 Other specified postprocedural states: Secondary | ICD-10-CM

## 2024-04-12 DIAGNOSIS — I10 Essential (primary) hypertension: Secondary | ICD-10-CM

## 2024-04-12 DIAGNOSIS — Q2543 Congenital aneurysm of aorta: Secondary | ICD-10-CM

## 2024-04-12 DIAGNOSIS — I420 Dilated cardiomyopathy: Secondary | ICD-10-CM

## 2024-04-14 DIAGNOSIS — N5201 Erectile dysfunction due to arterial insufficiency: Secondary | ICD-10-CM | POA: Diagnosis not present

## 2024-04-14 DIAGNOSIS — C61 Malignant neoplasm of prostate: Secondary | ICD-10-CM | POA: Diagnosis not present

## 2024-04-14 DIAGNOSIS — N401 Enlarged prostate with lower urinary tract symptoms: Secondary | ICD-10-CM | POA: Diagnosis not present

## 2024-04-14 DIAGNOSIS — R351 Nocturia: Secondary | ICD-10-CM | POA: Diagnosis not present

## 2024-04-19 ENCOUNTER — Ambulatory Visit: Payer: PPO | Admitting: *Deleted

## 2024-04-19 DIAGNOSIS — Z0000 Encounter for general adult medical examination without abnormal findings: Secondary | ICD-10-CM | POA: Diagnosis not present

## 2024-04-19 NOTE — Progress Notes (Signed)
 Subjective:   Kenneth Ryan is a 78 y.o. male who presents for Medicare Annual/Subsequent preventive examination.  Visit Complete: Virtual I connected with  Burdette Stapleford Bart on 04/19/24 by a audio enabled telemedicine application and verified that I am speaking with the correct person using two identifiers.  Patient Location: Home  Provider Location: Home Office  I discussed the limitations of evaluation and management by telemedicine. The patient expressed understanding and agreed to proceed.  Vital Signs: Because this visit was a virtual/telehealth visit, some criteria may be missing or patient reported. Any vitals not documented were not able to be obtained and vitals that have been documented are patient reported.  Patient Medicare AWV questionnaire was completed by the patient on 04-13-2024; I have confirmed that all information answered by patient is correct and no changes since this date.  Cardiac Risk Factors include: advanced age (>21men, >22 women);hypertension;male gender;family history of premature cardiovascular disease     Objective:    There were no vitals filed for this visit. There is no height or weight on file to calculate BMI.     04/19/2024    1:49 PM 04/14/2023    1:33 PM 01/14/2023   10:24 AM 10/22/2022    9:23 AM 08/31/2022   11:10 AM 07/15/2022    4:41 PM 03/12/2022    2:39 PM  Advanced Directives  Does Patient Have a Medical Advance Directive? Yes Yes Yes Yes Yes Yes Yes  Type of Estate agent of [employer] Power of [place];Living will Living will;Healthcare Power of Attorney Living will;Healthcare Power of Attorney Living will;Healthcare Power of [employer] Power of [place];Living will Healthcare Power of [place];Living will  Does patient want to make changes to medical advance directive?       No - Patient declined  Copy of Healthcare Power of Attorney in Chart? Yes - validated most recent copy scanned in chart  (See row information) No - copy requested     No - copy requested    Current Medications (verified) Outpatient Encounter Medications as of 04/19/2024  Medication Sig   acetaminophen  (TYLENOL ) 500 MG tablet Take 500-1,000 mg by mouth every 6 (six) hours as needed for mild pain (depends on pain level if takes 500 mg or 1000 mg).   aspirin  EC 81 MG EC tablet Take 1 tablet (81 mg total) by mouth daily.   fenofibrate  (TRICOR ) 145 MG tablet TAKE 1 TABLET (145 MG TOTAL) BY MOUTH DAILY.   furosemide  (LASIX ) 20 MG tablet Take 1 tablet (20 mg total) by mouth daily as needed for fluid or edema (2 or more pounds).   irbesartan  (AVAPRO ) 300 MG tablet TAKE 1 TABLET BY MOUTH EVERY DAY   metoprolol  succinate (TOPROL -XL) 50 MG 24 hr tablet TAKE 1 TABLET BY MOUTH EVERY DAY   Multiple Vitamins-Minerals (MULTIVITAMIN PO) Take 1 tablet by mouth daily.    pantoprazole  (PROTONIX ) 40 MG tablet TAKE 1 TABLET BY MOUTH EVERY DAY   warfarin (COUMADIN ) 5 MG tablet TAKE 1/2 TABLET TO 1 TABLET DAILY AS DIRECTED BY THE COUMADIN  CLINIC.   No facility-administered encounter medications on file as of 04/19/2024.    Allergies (verified) Patient has no known allergies.   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  Right +brach,rad,ulnar-->occured  while he was briefly off coumadin -->thrombectomy by vasc surg   Chronic hoarseness    secondary to hx of laryngeal surgery   GERD (gastroesophageal reflux disease)    Barrett's esoph on EGD 06/06/21   History of arterial thrombosis    brachial,radial,ulnar 12/2022 when off coumadin  for brachytherapy--->embolectomy-->ASA +coumadin  indef   History of basal cell carcinoma    nose   History of laryngeal cancer 2002   Surgery and radiation completed same year 2002---  oncologist released pt  summer 2014   (benign bx by dr Pearley Bough in 10/ 2016)   History of lower GI bleeding 05/2021   s/p   EGD w/some non bleeding gastritis, Colonoscopy with int/ext hemorrhoids, 1 polyp   Hyperlipidemia, mixed    Malignant neoplasm prostate (HCC) 07/2022   urologist-- wrenn/  radiation oncologist--- dr Lorri Rota;  dx 08/ 2023,  Gleason 3+4   Nonischemic cardiomyopathy (HCC) 08/2016   followed by cardiologist-  dr Renna Cary;    per echo ef 40-45%  09/ 2017;   echo post surgery in 2017  ef 35%;  improved 2019 ;  last echo 05-07-2022  ef 55-60%   Pre-diabetes    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  Per CV surg, as of 02/16/17, his INR goal is 1.5-2.5.   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   Severe Mitral regurg;   s/p  Complex valvuloplasty including artificial Gore-tex neochord placement x6 and 28 mm Sorin Memo 3D ring annuloplasty   SOB (shortness of breath) on exertion    normal activities ok but more strenous acitivity sob which is baseline per cardiologist note   Wears glasses    Past Surgical History:  Procedure Laterality Date   BENTALL PROCEDURE N/A 11/03/2016   Procedure: BENTALL PROCEDURE AORTIC ROOT REPLACEMENT + mechanical AV;  Surgeon: Gardenia Jump, MD;  Location: [facility] OR;  Service: Open Heart Surgery;  Laterality: N/A;   BIOPSY  06/05/2021   Procedure: BIOPSY;  Surgeon: Lindle Rhea, MD;  Location: [facility] ENDOSCOPY;  Service: Gastroenterology;;   CARDIAC CATHETERIZATION N/A 09/29/2016   Clean coronaries, EF 60-65%.  Procedure: Right/Left Heart Cath and Coronary Angiography;  Surgeon: Lucendia Rusk, MD;  Location: [facility] INVASIVE CV LAB;  Service: Cardiovascular;  Laterality: N/A;    CHOLECYSTECTOMY  1980   COLONOSCOPY WITH PROPOFOL  N/A 06/05/2021   diverticulosis descending and sigmoid, int+ext nonbleeding hemorr, 1 sessile seratted polyp w/out cytologic dysplasia. Procedure: COLONOSCOPY WITH PROPOFOL ;  Surgeon: Lindle Rhea, MD;  Location: [facility];  Service: Gastroenterology;  Laterality: N/A;   DIRECT LARYNGOSCOPY Left 10/17/2015   Procedure: MICRO DIRECT LARYNGOSCOPY WITH FROZEN SECTION;  Surgeon: Lenton Rail, MD;  Location: [facility] OR;  Service: ENT;  Laterality: Left;   EMBOLECTOMY Right 12/27/2022   Procedure: RIGHT BRACHIAL, ULNAR, AND RADIAL ARTERY EMBOLECTOMY;  Surgeon: Kayla Part, MD;  Location: [facility] OR;  Service: Vascular;  Laterality: Right;   ESOPHAGOGASTRODUODENOSCOPY (EGD) WITH PROPOFOL  N/A 06/05/2021   Barrett's esoph.  Gastritis (h pylori neg), multiple benign gastric polyps. Procedure: ESOPHAGOGASTRODUODENOSCOPY (EGD) WITH PROPOFOL ;  Surgeon: Lindle Rhea, MD;  Location: [facility];  Service: Gastroenterology;  Laterality: N/A;   LARYNX SURGERY  2002   removal vocal cord tumor (cancer)   MITRAL  VALVE REPAIR N/A 11/03/2016   Procedure: MITRAL VALVE REPAIR (MVR);  Surgeon: Gardenia Jump, MD;  Location: [facility] OR;  Service: Open Heart Surgery;  Laterality: N/A;   PERIPHERAL VASCULAR CATHETERIZATION N/A 09/29/2016   Procedure: Abdominal Aortogram;  Surgeon: Lucendia Rusk, MD;  Location: [facility] INVASIVE CV LAB;  Service: Cardiovascular;  Laterality: N/A;   PERIPHERAL VASCULAR CATHETERIZATION N/A 09/29/2016   Procedure: Thoracic Aortogram;  Surgeon: Lucendia Rusk, MD;  Location: [facility] INVASIVE CV LAB;  Service: Cardiovascular;  Laterality: N/A;   POLYPECTOMY  06/05/2021   Procedure: POLYPECTOMY;  Surgeon: Lindle Rhea, MD;  Location: [facility];  Service: Gastroenterology;;   RADIOACTIVE SEED IMPLANT N/A 12/25/2022   Procedure: RADIOACTIVE SEED IMPLANT/BRACHYTHERAPY IMPLANT;  Surgeon: Homero Luster, MD;  Location: [facility];   Service: Urology;  Laterality: N/A;   SPACE OAR INSTILLATION N/A 12/25/2022   Procedure: SPACE OAR INSTILLATION;  Surgeon: Homero Luster, MD;  Location: [facility];  Service: Urology;  Laterality: N/A;   TEE WITHOUT CARDIOVERSION N/A 09/17/2016   Procedure: TRANSESOPHAGEAL ECHOCARDIOGRAM (TEE);  Surgeon: Hugh Madura, MD;  Location: [facility] ENDOSCOPY;  Service: Cardiovascular;  Laterality: N/A;   TEE WITHOUT CARDIOVERSION N/A 11/03/2016   Procedure: TRANSESOPHAGEAL ECHOCARDIOGRAM (TEE);  Surgeon: Gardenia Jump, MD;  Location: [facility] OR;  Service: Open Heart Surgery;  Laterality: N/A;   TONSILLECTOMY AND ADENOIDECTOMY     child   TRANSESOPHAGEAL ECHOCARDIOGRAM  09/17/2016   Severe MR with ruptured chordae.  Mod/severe AR w/functional bicuspid AV.  EF 45%.   Family History  Problem Relation Age of Onset   Heart disease Father    Breast cancer Mother    Breast cancer Sister    Diabetes Brother    Kidney failure Son    Breast cancer Sister    Social History   Socioeconomic History   Marital status: Widowed    Spouse name: Not on file   Number of children: 1   Years of education: Not on file   Highest education level: Associate degree: academic program  Occupational History   Occupation: retired  Tobacco Use   Smoking status: Former    Current packs/day: 0.00    Types: Cigarettes    Start date: 07/22/1967    Quit date: 07/21/1989    Years since quitting: 34.7   Smokeless tobacco: Never  Vaping Use   Vaping status: Never Used  Substance and Sexual Activity   Alcohol use: Yes    Comment: rare   Drug use: Never   Sexual activity: Not on file  Other Topics Concern   Not on file  Social History Narrative   Widower (approx 2014/05/13), one son deceased 05/14/2011.   Relocated from [place] to [facility] 06/2013 to be near grandchildren.   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   Smoker 20 pack-yr hx: quit about 1980.   Alcohol: 3 bottles of beer per week avg.     Drugs: none         Social  Drivers of Corporate investment banker Strain: Low Risk  (04/19/2024)   Overall Financial Resource Strain (CARDIA)    Difficulty of Paying Living Expenses: Not hard at all  Food Insecurity: No Food Insecurity (04/19/2024)   Hunger Vital Sign    Worried About Running Out of Food in the Last Year: Never true    Ran Out of Food in the Last Year: Never true  Transportation Needs: No Transportation Needs (04/19/2024)   PRAPARE - Transportation  Lack of Transportation (Medical): No    Lack of Transportation (Non-Medical): No  Physical Activity: Insufficiently Active (04/19/2024)   Exercise Vital Sign    Days of Exercise per Week: 1 day    Minutes of Exercise per Session: 10 min  Stress: No Stress Concern Present (04/19/2024)   [employer] of Occupational Health - Occupational Stress Questionnaire    Feeling of Stress : Only a little  Social Connections: Moderately Integrated (04/19/2024)   Social Connection and Isolation Panel [NHANES]    Frequency of Communication with Friends and Family: More than three times a week    Frequency of Social Gatherings with Friends and Family: More than three times a week    Attends Religious Services: More than 4 times per year    Active Member of [employer] or Organizations: Yes    Attends Banker Meetings: More than 4 times per year    Marital Status: Widowed    Tobacco Counseling Counseling given: Not Answered   Clinical Intake:  Pre-visit preparation completed: Yes  Pain : No/denies pain     Diabetes: No  How often do you need to have someone help you when you read instructions, pamphlets, or other written materials from your doctor or pharmacy?: 1 - Never  Interpreter Needed?: No  Information entered by :: Kieth Pelt LPN   Activities of Daily Living    04/19/2024    2:09 PM 04/13/2024    9:23 AM  In your present state of health, do you have any difficulty performing the following activities:  Hearing? 0 0  Vision? 0 0   Difficulty concentrating or making decisions? 0 0  Walking or climbing stairs? 0 0  Dressing or bathing? 0 0  Doing errands, shopping? 0 0  Preparing Food and eating ? N N  Using the Toilet? N N  In the past six months, have you accidently leaked urine? N N  Do you have problems with loss of bowel control? N N  Managing your Medications? N N  Managing your Finances? N N  Housekeeping or managing your Housekeeping? N N    Patient Care Team: Shelvia Dick, MD as PCP - General (Family Medicine) Hugh Madura, MD as PCP - Cardiology (Cardiology) Hugh Madura, MD as Consulting Physician (Cardiology) Gardenia Jump, MD (Inactive) as Consulting Physician (Cardiothoracic Surgery) Homero Luster, MD as Consulting Physician (Urology) Loretha Romney (Dentistry) Nelva Bang, OD as Referring Physician (Optometry) Lindle Rhea, MD (Inactive) as Consulting Physician (Gastroenterology) Katheleen Palmer, RN as Oncology Nurse Navigator  Indicate any recent Medical Services you may have received from other than [facility] providers in the past year (date may be approximate).     Assessment:   This is a routine wellness examination for Gardy.  Hearing/Vision screen Hearing Screening - Comments:: Patient having some trouble hearing Vision Screening - Comments:: [facility]  Up to date   Goals Addressed             This Visit's Progress    Increase physical activity   Not on track    Start using treadmill again.      Patient Stated   On track    Complete work on my hot rod     Patient Stated   On track    None at this time      Patient Stated       Stay alive       Depression Screen    04/19/2024    1:50  PM 04/14/2023    1:33 PM 01/04/2023   11:00 AM 11/09/2022    9:08 AM 03/12/2022    2:38 PM 11/06/2021    9:12 AM 03/05/2021    9:56 AM  PHQ 2/9 Scores  PHQ - 2 Score 0 0 0 0 0 0 1  PHQ- 9 Score 4  0        Fall Risk    04/19/2024    1:49 PM 04/13/2024    9:23 AM 04/14/2023    1:34  PM 01/04/2023   11:00 AM 12/26/2022   10:30 AM  Fall Risk   Falls in the past year? 0 0 0 0 0  Number falls in past yr: 0 0 0 0 0  Injury with Fall? 0 0 0 0 0  Risk for fall due to :   Impaired vision Impaired vision;No Fall Risks   Follow up Falls evaluation completed;Education provided;Falls prevention discussed  Falls prevention discussed Falls evaluation completed     MEDICARE RISK AT HOME: Medicare Risk at Home Any stairs in or around the home?: Yes If so, are there any without handrails?: Yes Home free of loose throw rugs in walkways, pet beds, electrical cords, etc?: Yes Adequate lighting in your home to reduce risk of falls?: Yes Life alert?: No Use of a cane, walker or w/c?: No Grab bars in the bathroom?: Yes Shower chair or bench in shower?: No Elevated toilet seat or a handicapped toilet?: No  TIMED UP AND GO:  Was the test performed?  No    Cognitive Function:    03/12/2022    2:49 PM 03/12/2022    2:48 PM 02/27/2019   10:13 AM 02/15/2017    9:18 AM  MMSE - Mini Mental State Exam  Not completed: Unable to complete     Orientation to time  5 5 5   Orientation to Place  5 5 5   Registration  3 3 3   Attention/ Calculation  5 5 5   Recall  3 2 2   Language- name 2 objects  2 2 2   Language- repeat  1 1 1   Language- follow 3 step command  3 3 3   Language- read & follow direction  1 1 1   Write a sentence  1 1 1   Copy design  1 1 1   Total score  30 29 29         04/19/2024    2:10 PM 04/14/2023    1:35 PM 03/12/2022    2:43 PM  6CIT Screen  What Year? 0 points 0 points 0 points  What month? 0 points 0 points 0 points  What time? 0 points 0 points 0 points  Count back from 20 0 points 0 points 0 points  Months in reverse 0 points 0 points 0 points  Repeat phrase 0 points 0 points 0 points  Total Score 0 points 0 points 0 points    Immunizations Immunization History  Administered Date(s) Administered   Fluad Quad(high Dose 65+) 09/06/2019, 10/10/2021, 08/25/2022    Influenza, High Dose Seasonal PF 09/07/2016, 09/07/2017, 09/20/2018, 08/29/2020   Influenza,inj,Quad PF,6+ Mos 09/15/2013, 08/28/2015   PFIZER(Purple Top)SARS-COV-2 Vaccination 02/18/2020, 03/19/2020, 09/20/2020   Pfizer Covid-19 Vaccine Bivalent Booster 52yrs & up 10/10/2021   Pfizer(Comirnaty)Fall Seasonal Vaccine 12 years and older 09/30/2023   Pneumococcal Conjugate-13 08/28/2015   Pneumococcal Polysaccharide-23 09/18/2016   Tdap 08/28/2015   Zoster Recombinant(Shingrix) 09/06/2019, 11/30/2019   Zoster, Live 05/06/2015    TDAP status: Up to date  Flu  Vaccine status: Up to date  Pneumococcal vaccine status: Up to date  Covid-19 vaccine status: Information provided on how to obtain vaccines.   Qualifies for Shingles Vaccine? No   Zostavax completed Yes   Shingrix Completed?: Yes  Screening Tests Health Maintenance  Topic Date Due   COVID-19 Vaccine (6 - Pfizer risk 2024-25 season) 03/30/2024   Hepatitis C Screening  10/03/2036 (Originally 05/15/1964)   INFLUENZA VACCINE  07/21/2024   Medicare Annual Wellness (AWV)  04/19/2025   DTaP/Tdap/Td (2 - Td or Tdap) 08/27/2025   Colonoscopy  06/06/2031   Pneumonia Vaccine 69+ Years old  Completed   Zoster Vaccines- Shingrix  Completed   HPV VACCINES  Aged Out   Meningococcal B Vaccine  Aged Out    Health Maintenance  Health Maintenance Due  Topic Date Due   COVID-19 Vaccine (6 - Pfizer risk 2024-25 season) 03/30/2024    Colorectal cancer screening: No longer required.   Lung Cancer Screening: (Low Dose CT Chest recommended if Age 75-80 years, 20 pack-year currently smoking OR have quit w/in 15years.) does not qualify.   Lung Cancer Screening Referral:   Additional Screening:  Hepatitis C Screening: does qualify;   Vision Screening: Recommended annual ophthalmology exams for early detection of glaucoma and other disorders of the eye. Is the patient up to date with their annual eye exam?  Yes  Who is the provider or  what is the name of the office in which the patient attends annual eye exams? [facility] If pt is not established with a provider, would they like to be referred to a provider to establish care? No .   Dental Screening: Recommended annual dental exams for proper oral hygiene   Community Resource Referral / Chronic Care Management: CRR required this visit?  No   CCM required this visit?  No     Plan:     I have personally reviewed and noted the following in the patient's chart:   Medical and social history Use of alcohol, tobacco or illicit drugs  Current medications and supplements including opioid prescriptions. Patient is not currently taking opioid prescriptions. Functional ability and status Nutritional status Physical activity Advanced directives List of other physicians Hospitalizations, surgeries, and ER visits in previous 12 months Vitals Screenings to include cognitive, depression, and falls Referrals and appointments  In addition, I have reviewed and discussed with patient certain preventive protocols, quality metrics, and best practice recommendations. A written personalized care plan for preventive services as well as general preventive health recommendations were provided to patient.     Kieth Pelt, LPN   1/61/0960   After Visit Summary: (MyChart) Due to this being a telephonic visit, the after visit summary with patients personalized plan was offered to patient via MyChart   Nurse Notes:

## 2024-04-19 NOTE — Patient Instructions (Signed)
 Kenneth Ryan , Thank you for taking time to come for your Medicare Wellness Visit. I appreciate your ongoing commitment to your health goals. Please review the following plan we discussed and let me know if I can assist you in the future.   Screening recommendations/referrals: Colonoscopy: no longer required  Recommended yearly ophthalmology/optometry visit for glaucoma screening and checkup Recommended yearly dental visit for hygiene and checkup  Vaccinations: Influenza vaccine: up to date Pneumococcal vaccine: up to date Tdap vaccine: up to date Shingles vaccine: up to date      Preventive Care 65 Years and Older, Male Preventive care refers to lifestyle choices and visits with your health care provider that can promote health and wellness. What does preventive care include? A yearly physical exam. This is also called an annual well check. Dental exams once or twice a year. Routine eye exams. Ask your health care provider how often you should have your eyes checked. Personal lifestyle choices, including: Daily care of your teeth and gums. Regular physical activity. Eating a healthy diet. Avoiding tobacco and drug use. Limiting alcohol use. Practicing safe sex. Taking low doses of aspirin  every day. Taking vitamin and mineral supplements as recommended by your health care provider. What happens during an annual well check? The services and screenings done by your health care provider during your annual well check will depend on your age, overall health, lifestyle risk factors, and family history of disease. Counseling  Your health care provider may ask you questions about your: Alcohol use. Tobacco use. Drug use. Emotional well-being. Home and relationship well-being. Sexual activity. Eating habits. History of falls. Memory and ability to understand (cognition). Work and work Astronomer. Screening  You may have the following tests or measurements: Height, weight, and  BMI. Blood pressure. Lipid and cholesterol levels. These may be checked every 5 years, or more frequently if you are over 35 years old. Skin check. Lung cancer screening. You may have this screening every year starting at age 12 if you have a 30-pack-year history of smoking and currently smoke or have quit within the past 15 years. Fecal occult blood test (FOBT) of the stool. You may have this test every year starting at age 75. Flexible sigmoidoscopy or colonoscopy. You may have a sigmoidoscopy every 5 years or a colonoscopy every 10 years starting at age 88. Prostate cancer screening. Recommendations will vary depending on your family history and other risks. Hepatitis C blood test. Hepatitis B blood test. Sexually transmitted disease (STD) testing. Diabetes screening. This is done by checking your blood sugar (glucose) after you have not eaten for a while (fasting). You may have this done every 1-3 years. Abdominal aortic aneurysm (AAA) screening. You may need this if you are a current or former smoker. Osteoporosis. You may be screened starting at age 41 if you are at high risk. Talk with your health care provider about your test results, treatment options, and if necessary, the need for more tests. Vaccines  Your health care provider may recommend certain vaccines, such as: Influenza vaccine. This is recommended every year. Tetanus, diphtheria, and acellular pertussis (Tdap, Td) vaccine. You may need a Td booster every 10 years. Zoster vaccine. You may need this after age 28. Pneumococcal 13-valent conjugate (PCV13) vaccine. One dose is recommended after age 34. Pneumococcal polysaccharide (PPSV23) vaccine. One dose is recommended after age 25. Talk to your health care provider about which screenings and vaccines you need and how often you need them. This information is not  intended to replace advice given to you by your health care provider. Make sure you discuss any questions you have  with your health care provider. Document Released: 01/03/2016 Document Revised: 08/26/2016 Document Reviewed: 10/08/2015 Elsevier Interactive Patient Education  2017 [employer].  Fall Prevention in the Home Falls can cause injuries. They can happen to people of all ages. There are many things you can do to make your home safe and to help prevent falls. What can I do on the outside of my home? Regularly fix the edges of walkways and driveways and fix any cracks. Remove anything that might make you trip as you walk through a door, such as a raised step or threshold. Trim any bushes or trees on the path to your home. Use bright outdoor lighting. Clear any walking paths of anything that might make someone trip, such as rocks or tools. Regularly check to see if handrails are loose or broken. Make sure that both sides of any steps have handrails. Any raised decks and porches should have guardrails on the edges. Have any leaves, snow, or ice cleared regularly. Use sand or salt on walking paths during winter. Clean up any spills in your garage right away. This includes oil or grease spills. What can I do in the bathroom? Use night lights. Install grab bars by the toilet and in the tub and shower. Do not use towel bars as grab bars. Use non-skid mats or decals in the tub or shower. If you need to sit down in the shower, use a plastic, non-slip stool. Keep the floor dry. Clean up any water  that spills on the floor as soon as it happens. Remove soap buildup in the tub or shower regularly. Attach bath mats securely with double-sided non-slip rug tape. Do not have throw rugs and other things on the floor that can make you trip. What can I do in the bedroom? Use night lights. Make sure that you have a light by your bed that is easy to reach. Do not use any sheets or blankets that are too big for your bed. They should not hang down onto the floor. Have a firm chair that has side arms. You can use  this for support while you get dressed. Do not have throw rugs and other things on the floor that can make you trip. What can I do in the kitchen? Clean up any spills right away. Avoid walking on wet floors. Keep items that you use a lot in easy-to-reach places. If you need to reach something above you, use a strong step stool that has a grab bar. Keep electrical cords out of the way. Do not use floor polish or wax that makes floors slippery. If you must use wax, use non-skid floor wax. Do not have throw rugs and other things on the floor that can make you trip. What can I do with my stairs? Do not leave any items on the stairs. Make sure that there are handrails on both sides of the stairs and use them. Fix handrails that are broken or loose. Make sure that handrails are as long as the stairways. Check any carpeting to make sure that it is firmly attached to the stairs. Fix any carpet that is loose or worn. Avoid having throw rugs at the top or bottom of the stairs. If you do have throw rugs, attach them to the floor with carpet tape. Make sure that you have a light switch at the top of the stairs  and the bottom of the stairs. If you do not have them, ask someone to add them for you. What else can I do to help prevent falls? Wear shoes that: Do not have high heels. Have rubber bottoms. Are comfortable and fit you well. Are closed at the toe. Do not wear sandals. If you use a stepladder: Make sure that it is fully opened. Do not climb a closed stepladder. Make sure that both sides of the stepladder are locked into place. Ask someone to hold it for you, if possible. Clearly mark and make sure that you can see: Any grab bars or handrails. First and last steps. Where the edge of each step is. Use tools that help you move around (mobility aids) if they are needed. These include: Canes. Walkers. Scooters. Crutches. Turn on the lights when you go into a dark area. Replace any light bulbs  as soon as they burn out. Set up your furniture so you have a clear path. Avoid moving your furniture around. If any of your floors are uneven, fix them. If there are any pets around you, be aware of where they are. Review your medicines with your doctor. Some medicines can make you feel dizzy. This can increase your chance of falling. Ask your doctor what other things that you can do to help prevent falls. This information is not intended to replace advice given to you by your health care provider. Make sure you discuss any questions you have with your health care provider. Document Released: 10/03/2009 Document Revised: 05/14/2016 Document Reviewed: 01/11/2015 Elsevier Interactive Patient Education  2017 [employer].

## 2024-04-25 ENCOUNTER — Ambulatory Visit: Attending: Cardiology

## 2024-04-25 DIAGNOSIS — Z954 Presence of other heart-valve replacement: Secondary | ICD-10-CM

## 2024-04-25 DIAGNOSIS — Q2543 Congenital aneurysm of aorta: Secondary | ICD-10-CM

## 2024-04-25 DIAGNOSIS — Z9889 Other specified postprocedural states: Secondary | ICD-10-CM

## 2024-04-25 DIAGNOSIS — Z5181 Encounter for therapeutic drug level monitoring: Secondary | ICD-10-CM

## 2024-04-25 LAB — POCT INR: INR: 3 (ref 2.0–3.0)

## 2024-04-25 NOTE — Patient Instructions (Signed)
 Description   Skip tomorrow's dosage of Warfarin, then resume same dosage of Warfarin 1/2 tablet daily except for 1 tablet on Sundays. Recheck INR in 6 weeks. Stay consistent with greens.  [facility]  Clinic [phone];

## 2024-05-05 ENCOUNTER — Ambulatory Visit: Payer: Self-pay | Admitting: *Deleted

## 2024-05-05 ENCOUNTER — Ambulatory Visit: Admitting: Urgent Care

## 2024-05-05 ENCOUNTER — Encounter: Payer: Self-pay | Admitting: Urgent Care

## 2024-05-05 VITALS — BP 110/50 | HR 63 | Wt 220.8 lb

## 2024-05-05 DIAGNOSIS — R7303 Prediabetes: Secondary | ICD-10-CM

## 2024-05-05 DIAGNOSIS — W540XXA Bitten by dog, initial encounter: Secondary | ICD-10-CM | POA: Diagnosis not present

## 2024-05-05 DIAGNOSIS — S81852A Open bite, left lower leg, initial encounter: Secondary | ICD-10-CM | POA: Diagnosis not present

## 2024-05-05 DIAGNOSIS — I10 Essential (primary) hypertension: Secondary | ICD-10-CM

## 2024-05-05 DIAGNOSIS — Z7901 Long term (current) use of anticoagulants: Secondary | ICD-10-CM

## 2024-05-05 DIAGNOSIS — Z952 Presence of prosthetic heart valve: Secondary | ICD-10-CM | POA: Diagnosis not present

## 2024-05-05 DIAGNOSIS — R600 Localized edema: Secondary | ICD-10-CM | POA: Diagnosis not present

## 2024-05-05 LAB — POCT URINALYSIS DIPSTICK
Bilirubin, UA: NEGATIVE
Blood, UA: NEGATIVE
Glucose, UA: NEGATIVE
Ketones, UA: NEGATIVE
Leukocytes, UA: NEGATIVE
Nitrite, UA: NEGATIVE
Protein, UA: POSITIVE — AB
Spec Grav, UA: 1.025 (ref 1.010–1.025)
Urobilinogen, UA: 0.2 U/dL
pH, UA: 5.5 (ref 5.0–8.0)

## 2024-05-05 MED ORDER — MUPIROCIN 2 % EX OINT: 1.0000 | TOPICAL_OINTMENT | Freq: Three times a day (TID) | CUTANEOUS | 0 refills | Status: DC

## 2024-05-05 MED ORDER — FUROSEMIDE 20 MG PO TABS: ORAL_TABLET | ORAL | 1 refills | Status: DC

## 2024-05-05 NOTE — Patient Instructions (Addendum)
 Please start taking 1-2 tablets of lasix  daily as needed for blood pressure (20-40mg  depending on your response).  If you are consistently taking 40mg , please eat a banana daily to prevent low potassium.  Elevated your legs above the level of your heart when seated to help reduce fluid. Please see diagram below for proper leg placement:   Monitor your blood pressure. If it remains low, or if it drops lower while taking lasix , please cut your irbesartan  in half.   Please purchase compression stockings (knee high) and use to help with the fluid.  Pleas apply topical mupirocin to the dog bite area three times daily until resolved.   If you continue to have swelling despite the above recommendations, please return for recheck.

## 2024-05-05 NOTE — Telephone Encounter (Signed)
 Copied from CRM [id number]. Topic: Clinical - Red Word Triage >> May 05, 2024  9:15 AM Albertha Alosa wrote: [facility] that prompted transfer to Nurse Triage: Patient called in stating both his feet is swollen Reason for Disposition  [1] Swollen foot AND [2] no fever  (Exceptions: localized bump from bunions, calluses, insect bite, sting)    Bilateral feet swelling.  Answer Assessment - Initial Assessment Questions 1. ONSET: "When did the pain start?"      They are tight from the swelling in both of my feet. 2. LOCATION: "Where is the pain located?"      Both feet 3. PAIN: "How bad is the pain?"    (Scale 1-10; or mild, moderate, severe)  - MILD (1-3): doesn't interfere with normal activities.   - MODERATE (4-7): interferes with normal activities (e.g., work or school) or awakens from sleep, limping.   - SEVERE (8-10): excruciating pain, unable to do any normal activities, unable to walk.      Started the last 6 weeks.  I took some Lasix .  I took it over 2 days and swelling went down.   Then I had another episode so I took my Lasix  and they went down and now they are swollen again. The frequency is more often.    I'm needing guidance on how often I need to take these and how much water  to drink, etc.    The lasix  is 76 yrs old that I'm taking.   It's still working though.   4. WORK OR EXERCISE: "Has there been any recent work or exercise that involved this part of the body?"      No kidney problems.   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   I've rarely had to use the Lasix  for swelling.    No heart disease 5. CAUSE: "What do you think is causing the foot pain?"     I'm not sure.   95/49 is my BP today.    Don't know the reason why my feet swell.    6. OTHER SYMPTOMS: "Do you have any other symptoms?" (e.g., leg pain, rash, fever, numbness)     No chest pain.    I'm having some shortness of breath.   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   7. PREGNANCY: "Is there any chance you are pregnant?"  "When was your last menstrual period?"     N/A  Protocols used: Foot Pain-A-AH  Chief Complaint: Bilateral feet swelling for the last 6 weeks.    Symptoms: tight feeling in my feet when I walk due to the swelling.  I had an aortic valve replacement done in 2017 but never been told I have heart disease or kidney problems.    Frequency: On and off and with more frequency over the last 6 weeks.  Been using Lasix  that is 76 yrs old which helps. Pertinent Negatives: Patient denies knowing why his feet are swelling. Disposition: [] ED /[] Urgent Care (no appt availability in office) / [x] Appointment(In office/virtual)/ []  [facility]/ [] Home Care/ [] Refused Recommended Disposition /[] [place]/ []  Follow-up with PCP Additional Notes: Appt made for today at 1:00 with [place], PA.

## 2024-05-05 NOTE — Progress Notes (Signed)
 Established Patient Office Visit  Subjective:  Patient ID: Kenneth Ryan, male    DOB: 1946-01-26  Age: 77 y.o. MRN: [id number]  Chief Complaint  Patient presents with   Foot Swelling    Pt states his feet have been swelling on and off for a few weeks now. He does have a previous prescription of Lasix  that he has been taking.    HPI  Discussed the use of AI scribe software for clinical note transcription with the patient, who gave verbal consent to proceed.  History of Present Illness   Kenneth Ryan "Kenneth Ryan" is a 78 year old male with dilated cardiomyopathy who presents with recurrent swelling of the feet.  He experiences swelling in his feet that fluctuates in severity. Initially, the swelling responded to furosemide , which he took two pills of, two days in a row, approximately three weeks ago. The swelling subsided but recurred at the beginning of this week. He took two more pills of furosemide , but the swelling has persisted. The furosemide  he has been using is expired from 2020. The swelling is not painful but restricts movement. Elevating his feet reduces the swelling but does not return them to normal size. He cannot identify any specific activity or dietary intake, such as high salt consumption, that correlates with the onset of swelling. His sodium levels were normal in his last blood work, with a slight increase in chloride.  He denies any history of congestive heart failure but has been diagnosed with dilated cardiomyopathy. He reports occasional feelings of lethargy and weakness, particularly when standing up. He is currently taking metoprolol  50 mg and irbesartan  300 mg for blood pressure management. His blood pressure readings have been on the lower side recently, even on days he does not take furosemide . No increased urination, shortness of breath, chest pain, palpitations, or difficulty lying flat at night. He can exert himself without experiencing shortness of  breath.  He mentions a recent dog bite on his leg, occurring about two to three weeks ago, which he treated at home without medical attention. The redness from the bite is improving. Tdap UTD.     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  Right +brach,rad,ulnar-->occured while he was briefly off coumadin -->thrombectomy by vasc surg   Chronic hoarseness    secondary to hx of laryngeal surgery   GERD (gastroesophageal reflux disease)    Barrett's esoph on EGD 06/06/21   History of arterial thrombosis    brachial,radial,ulnar 12/2022 when off coumadin  for brachytherapy--->embolectomy-->ASA +coumadin   indef   History of basal cell carcinoma    nose   History of laryngeal cancer 2002   Surgery and radiation completed same year 2002---  oncologist released pt summer 2014   (benign bx by dr Pearley Bough in 10/ 2016)   History of lower GI bleeding 05/2021   s/p   EGD w/some non bleeding gastritis, Colonoscopy with int/ext hemorrhoids, 1  polyp   Hyperlipidemia, mixed    Malignant neoplasm prostate (HCC) 07/2022   urologist-- wrenn/  radiation oncologist--- dr Lorri Rota;  dx 08/ 2023,  Gleason 3+4   Nonischemic cardiomyopathy (HCC) 08/2016   followed by cardiologist-  dr Renna Cary;    per echo ef 40-45%  09/ 2017;   echo post surgery in 2017  ef 35%;  improved 2019 ;  last echo 05-07-2022  ef 55-60%   Pre-diabetes    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  Per CV surg, as of 02/16/17, his INR goal is 1.5-2.5.   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   Severe Mitral regurg;   s/p  Complex valvuloplasty including artificial Gore-tex neochord placement x6 and 28 mm Sorin Memo 3D ring annuloplasty   SOB (shortness of breath) on exertion    normal activities ok but more strenous acitivity sob which is baseline per cardiologist note   Wears glasses    Past Surgical History:  Procedure Laterality Date   BENTALL PROCEDURE N/A 11/03/2016   Procedure: BENTALL PROCEDURE AORTIC ROOT REPLACEMENT + mechanical AV;  Surgeon: Gardenia Jump, MD;  Location: [facility] OR;  Service: Open Heart Surgery;  Laterality: N/A;   BIOPSY  06/05/2021   Procedure: BIOPSY;  Surgeon: Lindle Rhea, MD;  Location: [facility] ENDOSCOPY;  Service: Gastroenterology;;   CARDIAC CATHETERIZATION N/A 09/29/2016   Clean coronaries, EF 60-65%.  Procedure: Right/Left Heart Cath and Coronary Angiography;  Surgeon: Lucendia Rusk, MD;  Location: [facility] INVASIVE CV LAB;  Service: Cardiovascular;  Laterality: N/A;   CHOLECYSTECTOMY  1980   COLONOSCOPY WITH PROPOFOL  N/A 06/05/2021   diverticulosis descending and sigmoid, int+ext nonbleeding hemorr, 1 sessile seratted polyp w/out cytologic  dysplasia. Procedure: COLONOSCOPY WITH PROPOFOL ;  Surgeon: Lindle Rhea, MD;  Location: [facility] ENDOSCOPY;  Service: Gastroenterology;  Laterality: N/A;   DIRECT LARYNGOSCOPY Left 10/17/2015   Procedure: MICRO DIRECT LARYNGOSCOPY WITH FROZEN SECTION;  Surgeon: Lenton Rail, MD;  Location: [facility] OR;  Service: ENT;  Laterality: Left;   EMBOLECTOMY Right 12/27/2022   Procedure: RIGHT BRACHIAL, ULNAR, AND RADIAL ARTERY EMBOLECTOMY;  Surgeon: Kayla Part, MD;  Location: [facility] OR;  Service: Vascular;  Laterality: Right;   ESOPHAGOGASTRODUODENOSCOPY (EGD) WITH PROPOFOL  N/A 06/05/2021   Barrett's esoph.  Gastritis (h pylori neg), multiple benign gastric polyps. Procedure: ESOPHAGOGASTRODUODENOSCOPY (EGD) WITH PROPOFOL ;  Surgeon: Lindle Rhea, MD;  Location: [facility]OS[facility];  Service: Gastroenterology;  Laterality: N/A;   LARYNX SURGERY  2002   removal vocal cord tumor (cancer)   MITRAL VALVE REPAIR N/A 11/03/2016   Procedure: MITRAL VALVE REPAIR (MVR);  Surgeon: Gardenia Jump, MD;  Location: [facility] OR;  Service: Open Heart Surgery;  Laterality: N/A;   PERIPHERAL VASCULAR CATHETERIZATION N/A 09/29/2016   Procedure: Abdominal Aortogram;  Surgeon: Lucendia Rusk, MD;  Location: [facility] INVASIVE CV LAB;  Service: Cardiovascular;  Laterality: N/A;   PERIPHERAL VASCULAR CATHETERIZATION N/A 09/29/2016   Procedure: Thoracic Aortogram;  Surgeon: Lucendia Rusk, MD;  Location: [facility] INVASIVE CV LAB;  Service: Cardiovascular;  Laterality: N/A;   POLYPECTOMY  06/05/2021   Procedure: POLYPECTOMY;  Surgeon: Lindle Rhea, MD;  Location: [facility];  Service: Gastroenterology;;   RADIOACTIVE SEED IMPLANT N/A 12/25/2022   Procedure: RADIOACTIVE SEED IMPLANT/BRACHYTHERAPY IMPLANT;  Surgeon: Homero Luster, MD;  Location: [facility];  Service: Urology;  Laterality: N/A;   SPACE OAR INSTILLATION N/A 12/25/2022   Procedure: SPACE OAR INSTILLATION;  Surgeon: Homero Luster, MD;  Location: [facility];  Service: Urology;  Laterality: N/A;   TEE WITHOUT CARDIOVERSION N/A 09/17/2016   Procedure: TRANSESOPHAGEAL ECHOCARDIOGRAM (TEE);  Surgeon: Hugh Madura, MD;  Location: [facility] ENDOSCOPY;  Service: Cardiovascular;  Laterality: N/A;   TEE WITHOUT CARDIOVERSION N/A 11/03/2016   Procedure: TRANSESOPHAGEAL ECHOCARDIOGRAM (TEE);  Surgeon: Gardenia Jump, MD;  Location: [facility] OR;  Service: Open Heart Surgery;  Laterality: N/A;   TONSILLECTOMY AND ADENOIDECTOMY     child   TRANSESOPHAGEAL ECHOCARDIOGRAM  09/17/2016   Severe MR with ruptured chordae.  Mod/severe AR w/functional bicuspid AV.  EF 45%.   Social History   Socioeconomic History   Marital status: Widowed    Spouse name: Not on file   Number of children: 1   Years of education: Not on file   Highest education level: Associate degree: academic program  Occupational History   Occupation: retired  Tobacco Use   Smoking status: Former    Current packs/day: 0.00    Types: Cigarettes    Start date: 07/22/1967    Quit date: 07/21/1989    Years since quitting: 34.8   Smokeless tobacco: Never  Vaping Use   Vaping status: Never Used  Substance and Sexual Activity   Alcohol use: Yes    Comment: rare   Drug use: Never   Sexual activity: Not on file  Other Topics Concern   Not on file  Social History Narrative   Widower (approx 2014/05/16), one son deceased 05-17-11.   Relocated from [place] to [facility] 06/2013 to be near grandchildren.   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   Smoker 20 pack-yr hx: quit about 1980.   Alcohol: 3 bottles of beer per week avg.     Drugs: none         Social Drivers of Corporate investment banker Strain: Low Risk  (04/19/2024)   Overall Financial Resource Strain (CARDIA)    Difficulty of Paying Living Expenses: Not hard at all  Food Insecurity: No Food Insecurity (04/19/2024)   Hunger Vital Sign    Worried About Running Out of Food in the Last Year: Never true    Ran Out of Food in the Last Year: Never true   Transportation Needs: No Transportation Needs (04/19/2024)   PRAPARE - Administrator, Civil Service (Medical): No    Lack of Transportation (Non-Medical): No  Physical Activity: Insufficiently Active (04/19/2024)   Exercise Vital Sign    Days of Exercise per Week: 1 day    Minutes of Exercise per Session: 10 min  Stress: No Stress Concern Present (04/19/2024)   [employer] of Occupational Health - Occupational Stress Questionnaire    Feeling of Stress : Only a little  Social Connections: Moderately Integrated (04/19/2024)   Social Connection and Isolation Panel [NHANES]    Frequency of Communication with Friends and Family: More than three  times a week    Frequency of Social Gatherings with Friends and Family: More than three times a week    Attends Religious Services: More than 4 times per year    Active Member of Clubs or Organizations: Yes    Attends Banker Meetings: More than 4 times per year    Marital Status: Widowed  Intimate Partner Violence: Not At Risk (04/19/2024)   Humiliation, Afraid, Rape, and Kick questionnaire    Fear of Current or Ex-Partner: No    Emotionally Abused: No    Physically Abused: No    Sexually Abused: No      ROS: as noted in HPI  Objective:     BP (!) 110/50   Pulse 63   Wt 220 lb 12.8 oz (100.2 kg)   SpO2 97%   BMI 29.95 kg/m  BP Readings from Last 3 Encounters:  05/05/24 (!) 110/50  03/09/24 93/60  01/13/24 124/76   Wt Readings from Last 3 Encounters:  05/05/24 220 lb 12.8 oz (100.2 kg)  11/12/23 220 lb 12.8 oz (100.2 kg)  05/18/23 228 lb (103.4 kg)      Physical Exam Vitals and nursing note reviewed.  Constitutional:      General: He is not in acute distress.    Appearance: Normal appearance. He is not ill-appearing, toxic-appearing or diaphoretic.  HENT:     Head: Normocephalic and atraumatic.     Nose: Nose normal.     Mouth/Throat:     Mouth: Mucous membranes are moist.     Pharynx: No  oropharyngeal exudate or posterior oropharyngeal erythema.  Eyes:     General: No scleral icterus.       Right eye: No discharge.        Left eye: No discharge.     Extraocular Movements: Extraocular movements intact.     Pupils: Pupils are equal, round, and reactive to light.  Cardiovascular:     Rate and Rhythm: Normal rate and regular rhythm.     Heart sounds: Murmur heard.  Pulmonary:     Effort: Pulmonary effort is normal. No respiratory distress.     Breath sounds: Normal breath sounds. No stridor. No wheezing, rhonchi or rales.  Musculoskeletal:     Cervical back: No tenderness.     Right lower leg: Edema present.     Left lower leg: Edema present.     Comments: Moderate pitting edema bilaterally Varicosities noted  Lymphadenopathy:     Cervical: No cervical adenopathy.  Skin:    General: Skin is warm and dry.     Findings: Lesion (healing puncture wound to L lateral lower leg, scabbed over, mild surrounding erythema without warmth) present.  Neurological:     Mental Status: He is alert.      Results for orders placed or performed in visit on 05/05/24  POCT urinalysis dipstick  Result Value Ref Range   Color, UA yellow    Clarity, UA clear    Glucose, UA Negative Negative   Bilirubin, UA negative    Ketones, UA negative    Spec Grav, UA 1.025 1.010 - 1.025   Blood, UA negative    pH, UA 5.5 5.0 - 8.0   Protein, UA Positive (A) Negative   Urobilinogen, UA 0.2 0.2 or 1.0 E.U./dL   Nitrite, UA negative    Leukocytes, UA Negative Negative   Appearance     Odor      Last CBC Lab Results  Component Value Date  WBC 5.0 11/12/2023   HGB 15.1 11/12/2023   HCT 46.7 11/12/2023   MCV 89.9 11/12/2023   MCH 29.3 12/30/2022   RDW 14.9 11/12/2023   PLT 186.0 11/12/2023   Last metabolic panel Lab Results  Component Value Date   GLUCOSE 96 11/12/2023   NA 141 11/12/2023   K 4.3 11/12/2023   CL 103 11/12/2023   CO2 33 (H) 11/12/2023   BUN 13 11/12/2023    CREATININE 1.13 11/12/2023   GFR 63.59 11/12/2023   CALCIUM 9.5 11/12/2023   PHOS 3.1 12/30/2022   PROT 6.4 11/12/2023   ALBUMIN  4.0 11/12/2023   BILITOT 0.8 11/12/2023   ALKPHOS 50 11/12/2023   AST 27 11/12/2023   ALT 17 11/12/2023   ANIONGAP 8 12/30/2022   Last lipids Lab Results  Component Value Date   CHOL 165 11/12/2023   HDL 33.60 (L) 11/12/2023   LDLCALC 104 (H) 11/12/2023   TRIG 136.0 11/12/2023   CHOLHDL 5 11/12/2023   Last hemoglobin A1c Lab Results  Component Value Date   HGBA1C 5.8 11/12/2023   Last thyroid  functions Lab Results  Component Value Date   TSH 2.700 04/20/2022   Last vitamin D No results found for: "25OHVITD2", "25OHVITD3", "VD25OH" Last vitamin B12 and Folate No results found for: "VITAMINB12", "FOLATE"    The 10-year ASCVD risk score (Arnett DK, et al., 2019) is: 23.9%  Assessment & Plan:  Peripheral edema -     POCT urinalysis dipstick -     CBC with Differential/Platelet -     Comprehensive metabolic panel with GFR -     TSH -     Hemoglobin A1c -     NT-proBNP -     Furosemide ; Take 1-2 tabs by mouth as needed for edema  Dispense: 30 tablet; Refill: 1  Prediabetes -     Hemoglobin A1c  Personal history of heart valve replacement  Primary hypertension -     CBC with Differential/Platelet -     Comprehensive metabolic panel with GFR -     TSH  Chronic anticoagulation  Open wound of left lower leg due to dog bite -     Mupirocin; Apply 1 Application topically 3 (three) times daily.  Dispense: 22 g; Refill: 0  Assessment and Plan    Peripheral edema Intermittent pedal edema, improving with elevation but not resolving completely. - Ordered urinalysis in office - only trace protein noted, otherwise normal. - will obtain labs including cmp, tsh, bnp. No clinical s/sx of CHF - likely dependent. Pt on anticoagulation, no concerns of dvt - refill lasix  as his is expired, however it was effective - elevate legs while  seated  Hypertension With intermittent low blood pressure. Occasional lightheadedness. Metoprolol  and irbesartan  may contribute. Monitor, cut irbesartan  in half if it persists.  Dilated cardiomyopathy Dilated cardiomyopathy without congestive heart failure. Tolerates exertion well. Recent echo showing normal EF.  Dog bite Redness decreasing, no swelling associated with the bite. Tdap UTD  -  start topical mupirocin and continue to monitor.        No follow-ups on file.   Mandy Second, PA

## 2024-05-08 ENCOUNTER — Ambulatory Visit: Payer: Self-pay | Admitting: Urgent Care

## 2024-05-09 LAB — HEMOGLOBIN A1C
Hgb A1c MFr Bld: 5.7 % — ABNORMAL HIGH (ref <5.7)
Mean Plasma Glucose: 117 mg/dL
eAG (mmol/L): 6.5 mmol/L

## 2024-05-09 LAB — CBC WITH DIFFERENTIAL/PLATELET
Absolute Lymphocytes: 2083 {cells}/uL (ref 850–3900)
Absolute Monocytes: 420 {cells}/uL (ref 200–950)
Basophils Absolute: 28 {cells}/uL (ref 0–200)
Basophils Relative: 0.5 %
Eosinophils Absolute: 151 {cells}/uL (ref 15–500)
Eosinophils Relative: 2.7 %
HCT: 42.2 % (ref 38.5–50.0)
Hemoglobin: 13.6 g/dL (ref 13.2–17.1)
MCH: 29.1 pg (ref 27.0–33.0)
MCHC: 32.2 g/dL (ref 32.0–36.0)
MCV: 90.4 fL (ref 80.0–100.0)
MPV: 11.9 fL (ref 7.5–12.5)
Monocytes Relative: 7.5 %
Neutro Abs: 2918 {cells}/uL (ref 1500–7800)
Neutrophils Relative %: 52.1 %
Platelets: 188 10*3/uL (ref 140–400)
RBC: 4.67 10*6/uL (ref 4.20–5.80)
RDW: 12.6 % (ref 11.0–15.0)
Total Lymphocyte: 37.2 %
WBC: 5.6 10*3/uL (ref 3.8–10.8)

## 2024-05-09 LAB — NT-PROBNP: Pro B Natriuretic peptide (BNP): 261 pg/mL (ref <450)

## 2024-05-09 LAB — TIQ-MISC

## 2024-05-09 LAB — TSH: TSH: 1.68 m[IU]/L (ref 0.40–4.50)

## 2024-06-08 ENCOUNTER — Ambulatory Visit: Attending: Cardiology | Admitting: Cardiology

## 2024-06-08 ENCOUNTER — Other Ambulatory Visit: Payer: Self-pay

## 2024-06-08 ENCOUNTER — Ambulatory Visit: Attending: Cardiology | Admitting: *Deleted

## 2024-06-08 ENCOUNTER — Other Ambulatory Visit: Payer: Self-pay | Admitting: *Deleted

## 2024-06-08 VITALS — BP 107/55 | HR 54

## 2024-06-08 DIAGNOSIS — Z954 Presence of other heart-valve replacement: Secondary | ICD-10-CM | POA: Diagnosis not present

## 2024-06-08 DIAGNOSIS — Z9889 Other specified postprocedural states: Secondary | ICD-10-CM | POA: Diagnosis not present

## 2024-06-08 DIAGNOSIS — I420 Dilated cardiomyopathy: Secondary | ICD-10-CM

## 2024-06-08 DIAGNOSIS — Q2543 Congenital aneurysm of aorta: Secondary | ICD-10-CM | POA: Diagnosis not present

## 2024-06-08 DIAGNOSIS — I10 Essential (primary) hypertension: Secondary | ICD-10-CM

## 2024-06-08 DIAGNOSIS — Z5181 Encounter for therapeutic drug level monitoring: Secondary | ICD-10-CM

## 2024-06-08 LAB — POCT INR: POC INR: 1.7

## 2024-06-08 MED ORDER — FUROSEMIDE 20 MG PO TABS: 20.0000 mg | ORAL_TABLET | Freq: Every day | ORAL | 3 refills | Status: DC

## 2024-06-08 MED ORDER — IRBESARTAN 150 MG PO TABS: 150.0000 mg | ORAL_TABLET | Freq: Every day | ORAL | 3 refills | Status: DC

## 2024-06-08 NOTE — Progress Notes (Signed)
Please see anticoagulation encounter.

## 2024-06-08 NOTE — Patient Instructions (Signed)
 Medication Instructions:  Please take Furosemide  20 mg a day. Decrease Irbesartan  to 150 mg a day. Continue all other medications as listed.  *If you need a refill on your cardiac medications before your next appointment, please call your pharmacy*  Lab Work: Please have blood work in 2 weeks at your closest [facility].   [facility])  If you have labs (blood work) drawn today and your tests are completely normal, you will receive your results only by: MyChart Message (if you have MyChart) OR A paper copy in the mail If you have any lab test that is abnormal or we need to change your treatment, we will call you to review the results.  Follow-Up: At [facility], you and your health needs are our priority.  As part of our continuing mission to provide you with exceptional heart care, our providers are all part of one team.  This team includes your primary Cardiologist (physician) and Advanced Practice Providers or APPs (Physician Assistants and Nurse Practitioners) who all work together to provide you with the care you need, when you need it.  Your next appointment:   3 month(s)  Provider:   One of our Advanced Practice Providers (APPs): Melita Springer, PA-C  Friddie Jetty, NP Evaline Hill, NP  Theotis Flake, PA-C Lawana Pray, NP  Willis Harter, PA-C Lovette Rud, PA-C  Clarendon Hills, PA-C Ernest Dick, NP  Marlana Silvan, NP Marcie Sever, PA-C  Laquita Plant, PA-C    Dayna Dunn, PA-C  Scott Weaver, PA-C Palmer Bobo, NP Katlyn West, NP Callie Goodrich, PA-C  Evan Williams, PA-C Sheng Haley, PA-C  Xika Zhao, NP Kathleen Johnson, PA-C   Then, Dorothye Gathers, MD will plan to see you again in 1 year(s).    We recommend signing up for the patient portal called MyChart.  Sign up information is provided on this After Visit Summary.  MyChart is used to connect with patients for Virtual Visits (Telemedicine).  Patients are able to view lab/test results, encounter notes, upcoming  appointments, etc.  Non-urgent messages can be sent to your provider as well.   To learn more about what you can do with MyChart, go to [email or web address].

## 2024-06-08 NOTE — Patient Instructions (Signed)
 Description   Take 1 tablet of warfarin today then resume same dosage of Warfarin 1/2 tablet daily except for 1 tablet on Sundays. Recheck INR in 3 weeks. Stay consistent with greens.  Coumadin  Clinic [phone];

## 2024-06-09 NOTE — Progress Notes (Signed)
 Cardiology Office Note:  .   Date:  06/09/2024  ID:  [name], DOB 1946-06-06, MRN [id number] PCP: Shelvia Dick, MD  [facility] Providers Cardiologist:  Dorothye Gathers, MD    History of Present Illness: .   Kenneth Ryan is a 78 y.o. male Discussed the use of AI scribe software for clinical note transcription with the patient, who gave verbal consent to proceed.  History of Present Illness Kenneth Ryan is a 78 year old male with a history of mechanical aortic valve replacement and mitral valve repair who presents for follow-up regarding fluid retention in lower extremties and medication management.  He has been experiencing bilateral ankle swelling over the past two months, which he manages with furosemide  as needed. Recently, he has been taking it more regularly due to increased swelling. Furosemide  is effective in reducing the swelling, with significant diuresis noted after administration. He also uses diabetic socks, compression socks, and elevates his legs to help manage the swelling.  He reports low blood pressure, with occasional systolic readings in the 80s. He is currently taking irbesartan  and metoprolol  for blood pressure management.  He has a history of mechanical aortic valve replacement and Bentall aortic root replacement in November 2017, along with mitral valve repair due to bicuspid aortic valve with severe aortic regurgitation. Postoperatively, his ejection fraction improved from 35% to 45-50%. He is on chronic anticoagulation therapy due to his mechanical aortic valve and experienced a right brachial thrombus in 2024 following a warfarin hold for a prostate procedure.  He has a past medical history of prostate cancer and a hypercoagulable state. He inquires about taking a B12 supplement, as his girlfriend suggested it might help with energy levels. He is currently taking a vitamin that includes B12.       Studies Reviewed: .         Results LABS Creatinine: 1.1 (2024)  DIAGNOSTIC Echocardiogram: Mechanical Aortic valve normal, EF 60% (2024) Risk Assessment/Calculations:             Physical Exam:   VS:  BP (!) 107/55 (BP Location: Left Arm, Patient Position: Sitting, Cuff Size: Normal)   Pulse (!) 54   SpO2 95%    Wt Readings from Last 3 Encounters:  05/05/24 220 lb 12.8 oz (100.2 kg)  11/12/23 220 lb 12.8 oz (100.2 kg)  05/18/23 228 lb (103.4 kg)    GEN: Well nourished, well developed in no acute distress NECK: No JVD; No carotid bruits CARDIAC: Sharp S2 click, RRR, no murmurs, no rubs, no gallops RESPIRATORY:  Clear to auscultation without rales, wheezing or rhonchi  ABDOMEN: Soft, non-tender, non-distended EXTREMITIES:  BLE moderate edema; No deformity   ASSESSMENT AND PLAN: .    Assessment and Plan Assessment & Plan Essential hypertension Blood pressure is low, with concerns about potential hypotension due to fluid loss from diuretic use. Current medication includes irbesartan  and metoprolol . Plan to adjust irbesartan  dosage to manage blood pressure more effectively. - Reduce irbesartan  dosage to 150 mg. - Monitor blood pressure at home. - Contact if blood pressure consistently exceeds 140 mmHg or falls below 80 mmHg. - Revert to original irbesartan  dosage if blood pressure is consistently above 140 mmHg.  Peripheral edema Increased peripheral edema over the last two months, requiring more frequent furosemide  use. Fluid accumulation likely contributing to weight gain. Plan to manage edema with daily diuretic use and lifestyle modifications. Echocardiogram ordered to assess heart function. - Take furosemide  20 mg daily,  not PRN. - Use compression socks. - Elevate legs when sitting. - Encourage walking to promote circulation. - Order echocardiogram to assess heart function.  Chronic anticoagulation due to mechanical aortic valve Chronic anticoagulation is necessary due to mechanical aortic  valve replacement. INR target range is 2 to 2.5.  Prostate cancer Previous hypercoagulable state noted.  Follow-up Monitor response to treatment adjustments and overall health status. - Schedule follow-up appointment in three months with an APP.           Signed, Dorothye Gathers, MD

## 2024-06-26 LAB — BASIC METABOLIC PANEL WITH GFR
BUN/Creatinine Ratio: 19 (ref 10–24)
BUN: 26 mg/dL (ref 8–27)
CO2: 22 mmol/L (ref 20–29)
Calcium: 9.5 mg/dL (ref 8.6–10.2)
Chloride: 105 mmol/L (ref 96–106)
Creatinine, Ser: 1.36 mg/dL — ABNORMAL HIGH (ref 0.76–1.27)
Glucose: 93 mg/dL (ref 70–99)
Potassium: 4.6 mmol/L (ref 3.5–5.2)
Sodium: 143 mmol/L (ref 134–144)
eGFR: 54 mL/min/1.73 — ABNORMAL LOW (ref >59)

## 2024-06-27 ENCOUNTER — Ambulatory Visit: Payer: Self-pay | Admitting: Cardiology

## 2024-06-29 ENCOUNTER — Ambulatory Visit: Attending: Cardiology | Admitting: *Deleted

## 2024-06-29 DIAGNOSIS — Z5181 Encounter for therapeutic drug level monitoring: Secondary | ICD-10-CM

## 2024-06-29 DIAGNOSIS — Z9889 Other specified postprocedural states: Secondary | ICD-10-CM

## 2024-06-29 LAB — POCT INR: POC INR: 1.6

## 2024-06-29 NOTE — Patient Instructions (Signed)
 Description   Take 1 tablet of warfarin today then START taking warfarin 1/2 a tablet daily except for 1 tablet on SUNDAYS AND WEDNESDAYS. Recheck INR in 2 weeks. Stay consistent with greens.  Coumadin  Clinic [phone];

## 2024-06-29 NOTE — Progress Notes (Signed)
Please see anticoagulation encounter.

## 2024-07-09 ENCOUNTER — Other Ambulatory Visit: Payer: Self-pay | Admitting: Cardiology

## 2024-07-09 DIAGNOSIS — Z954 Presence of other heart-valve replacement: Secondary | ICD-10-CM

## 2024-07-09 DIAGNOSIS — Z9889 Other specified postprocedural states: Secondary | ICD-10-CM

## 2024-07-13 ENCOUNTER — Ambulatory Visit: Attending: Cardiology

## 2024-07-13 DIAGNOSIS — Z9889 Other specified postprocedural states: Secondary | ICD-10-CM

## 2024-07-13 DIAGNOSIS — Z5181 Encounter for therapeutic drug level monitoring: Secondary | ICD-10-CM

## 2024-07-13 LAB — POCT INR: INR: 2.2 (ref 2.0–3.0)

## 2024-07-13 NOTE — Patient Instructions (Signed)
 Description   Continue taking warfarin 1/2 a tablet daily except for 1 tablet on SUNDAYS AND WEDNESDAYS.  Recheck INR in 2 weeks - post dental extraction on 07/21/24 Stay consistent with greens.  Coumadin  Clinic [phone]

## 2024-07-13 NOTE — Progress Notes (Signed)
 INR-2.2 Please see anticoagulation encounter

## 2024-07-27 ENCOUNTER — Ambulatory Visit: Attending: Cardiology

## 2024-07-27 DIAGNOSIS — Z9889 Other specified postprocedural states: Secondary | ICD-10-CM | POA: Diagnosis not present

## 2024-07-27 LAB — POCT INR: INR: 3 (ref 2.0–3.0)

## 2024-07-27 NOTE — Patient Instructions (Addendum)
 Description   HOLD tomorrow's dose and then continue taking warfarin 1/2 a tablet daily except for 1 tablet on SUNDAYS AND WEDNESDAYS.  Recheck INR in 3 weeks  Stay consistent with greens.  Coumadin  Clinic [phone]

## 2024-07-27 NOTE — Progress Notes (Signed)
 INR 3.0; Please see anticoagulation encounter

## 2024-07-28 DIAGNOSIS — H25013 Cortical age-related cataract, bilateral: Secondary | ICD-10-CM | POA: Diagnosis not present

## 2024-07-28 DIAGNOSIS — H25043 Posterior subcapsular polar age-related cataract, bilateral: Secondary | ICD-10-CM | POA: Diagnosis not present

## 2024-07-28 DIAGNOSIS — H35372 Puckering of macula, left eye: Secondary | ICD-10-CM | POA: Diagnosis not present

## 2024-07-28 DIAGNOSIS — H2513 Age-related nuclear cataract, bilateral: Secondary | ICD-10-CM | POA: Diagnosis not present

## 2024-07-28 DIAGNOSIS — H2511 Age-related nuclear cataract, right eye: Secondary | ICD-10-CM | POA: Diagnosis not present

## 2024-07-31 ENCOUNTER — Telehealth: Payer: Self-pay | Admitting: Cardiology

## 2024-07-31 MED ORDER — IRBESARTAN 75 MG PO TABS
75.0000 mg | ORAL_TABLET | Freq: Every evening | ORAL | 3 refills | Status: AC
Start: 2024-07-31 — End: ?

## 2024-07-31 NOTE — Telephone Encounter (Signed)
 Thukkani, Arun K, MD to [facility]  Jeffrie Oneil BROCKS, MD  (Selected Message) AT    07/31/24 10:48 AM Hold irbesartan  for 2 days then decrease irbesartan  to 75 mg and have him take it at bedtime.  Went over the information above. He verbalized understanding. New prescription sent to pharmacy.

## 2024-07-31 NOTE — Telephone Encounter (Signed)
 Pt c/o BP issue: STAT if pt c/o blurred vision, one-sided weakness or slurred speech.  STAT if BP is GREATER than 180/120 TODAY.  STAT if BP is LESS than 90/60 and SYMPTOMATIC TODAY  1. What is your BP concern? BP too low   2. Have you taken any BP medication today?Yes  3. What are your last 5 BP readings? Friday:62/38 Sunday:97/55 Today: 98/53  4. Are you having any other symptoms (ex. Dizziness, headache, blurred vision, passed out)? No

## 2024-07-31 NOTE — Telephone Encounter (Signed)
 Friday:62/38 Sunday:97/55 Today: 98/53- Now it is 89/53- pt is feeling a little light-headed. He is just sitting down playing Rummy. Told to go very slowly when going from lying, to sitting, to standing.   Asked the pt if he ever checks his BP before taking his medications, he said he does not. He takes his meds when he gets up in the morning. The only reason he checked it, is due to feeling light-headed. He has not passed out.  Instructed him to start checking his BP before taking his medication in the mornings. If top number is 100 or less and/or if bottom number is 60 or less, then do not take BP meds.  Also, he may need to split up when he takes his medication; take Toprol  at night and Irbesartan  in morning?   Told to hydrate and eat something with some salt in it- just one serving of crackers or chips to try to get BP up a little.  Informed that I will send this information to his provider and get back to him with recommendations.   He verbalized understanding.

## 2024-08-18 ENCOUNTER — Ambulatory Visit: Attending: Cardiology | Admitting: *Deleted

## 2024-08-18 DIAGNOSIS — Z5181 Encounter for therapeutic drug level monitoring: Secondary | ICD-10-CM | POA: Diagnosis not present

## 2024-08-18 DIAGNOSIS — Z9889 Other specified postprocedural states: Secondary | ICD-10-CM | POA: Diagnosis not present

## 2024-08-18 DIAGNOSIS — Z954 Presence of other heart-valve replacement: Secondary | ICD-10-CM

## 2024-08-18 DIAGNOSIS — Q2543 Congenital aneurysm of aorta: Secondary | ICD-10-CM

## 2024-08-18 LAB — POCT INR: INR: 2.7 (ref 2.0–3.0)

## 2024-08-18 NOTE — Patient Instructions (Addendum)
 Description   INR-2.7; Do not take any warfarin tomorrow and resume normal leafy intake and then continue taking warfarin 1/2 a tablet daily except for 1 tablet on SUNDAYS AND WEDNESDAYS.  Recheck INR in 3 weeks  Stay consistent with greens.  [facility]inic [phone]

## 2024-08-18 NOTE — Progress Notes (Addendum)
 Description   INR-2.7; Do not take any warfarin tomorrow and resume normal leafy intake and then continue taking warfarin 1/2 a tablet daily except for 1 tablet on SUNDAYS AND WEDNESDAYS.  Recheck INR in 3 weeks  Stay consistent with greens.  C[facility]in  Clinic [phone]

## 2024-08-28 DIAGNOSIS — H2511 Age-related nuclear cataract, right eye: Secondary | ICD-10-CM | POA: Diagnosis not present

## 2024-08-29 DIAGNOSIS — H2511 Age-related nuclear cataract, right eye: Secondary | ICD-10-CM | POA: Diagnosis not present

## 2024-08-29 DIAGNOSIS — H25012 Cortical age-related cataract, left eye: Secondary | ICD-10-CM | POA: Diagnosis not present

## 2024-08-29 DIAGNOSIS — H2512 Age-related nuclear cataract, left eye: Secondary | ICD-10-CM | POA: Diagnosis not present

## 2024-08-29 DIAGNOSIS — H25042 Posterior subcapsular polar age-related cataract, left eye: Secondary | ICD-10-CM | POA: Diagnosis not present

## 2024-09-04 ENCOUNTER — Encounter: Payer: Self-pay | Admitting: *Deleted

## 2024-09-06 ENCOUNTER — Encounter: Payer: Self-pay | Admitting: Dermatology

## 2024-09-06 ENCOUNTER — Ambulatory Visit: Attending: Emergency Medicine | Admitting: Emergency Medicine

## 2024-09-06 ENCOUNTER — Encounter: Payer: Self-pay | Admitting: Emergency Medicine

## 2024-09-06 ENCOUNTER — Ambulatory Visit (INDEPENDENT_AMBULATORY_CARE_PROVIDER_SITE_OTHER): Admitting: Dermatology

## 2024-09-06 ENCOUNTER — Ambulatory Visit (INDEPENDENT_AMBULATORY_CARE_PROVIDER_SITE_OTHER): Admitting: Pharmacist

## 2024-09-06 VITALS — BP 144/45 | HR 50

## 2024-09-06 VITALS — BP 104/56 | HR 55 | Ht 72.0 in | Wt 212.0 lb

## 2024-09-06 DIAGNOSIS — L578 Other skin changes due to chronic exposure to nonionizing radiation: Secondary | ICD-10-CM | POA: Diagnosis not present

## 2024-09-06 DIAGNOSIS — Z5181 Encounter for therapeutic drug level monitoring: Secondary | ICD-10-CM | POA: Diagnosis not present

## 2024-09-06 DIAGNOSIS — Z954 Presence of other heart-valve replacement: Secondary | ICD-10-CM

## 2024-09-06 DIAGNOSIS — L814 Other melanin hyperpigmentation: Secondary | ICD-10-CM | POA: Diagnosis not present

## 2024-09-06 DIAGNOSIS — D1801 Hemangioma of skin and subcutaneous tissue: Secondary | ICD-10-CM | POA: Diagnosis not present

## 2024-09-06 DIAGNOSIS — W908XXA Exposure to other nonionizing radiation, initial encounter: Secondary | ICD-10-CM

## 2024-09-06 DIAGNOSIS — L738 Other specified follicular disorders: Secondary | ICD-10-CM

## 2024-09-06 DIAGNOSIS — Z1283 Encounter for screening for malignant neoplasm of skin: Secondary | ICD-10-CM

## 2024-09-06 DIAGNOSIS — L821 Other seborrheic keratosis: Secondary | ICD-10-CM | POA: Diagnosis not present

## 2024-09-06 DIAGNOSIS — Z9889 Other specified postprocedural states: Secondary | ICD-10-CM

## 2024-09-06 DIAGNOSIS — I420 Dilated cardiomyopathy: Secondary | ICD-10-CM

## 2024-09-06 DIAGNOSIS — Z85828 Personal history of other malignant neoplasm of skin: Secondary | ICD-10-CM | POA: Diagnosis not present

## 2024-09-06 DIAGNOSIS — Q2543 Congenital aneurysm of aorta: Secondary | ICD-10-CM

## 2024-09-06 DIAGNOSIS — I10 Essential (primary) hypertension: Secondary | ICD-10-CM | POA: Diagnosis not present

## 2024-09-06 DIAGNOSIS — D229 Melanocytic nevi, unspecified: Secondary | ICD-10-CM

## 2024-09-06 LAB — POCT INR: INR: 2.3 (ref 2.0–3.0)

## 2024-09-06 MED ORDER — FUROSEMIDE 20 MG PO TABS: ORAL_TABLET | ORAL | 1 refills | Status: AC

## 2024-09-06 MED ORDER — METOPROLOL SUCCINATE ER 25 MG PO TB24: 25.0000 mg | ORAL_TABLET | Freq: Every day | ORAL | 1 refills | Status: AC

## 2024-09-06 MED ORDER — FUROSEMIDE 20 MG PO TABS: ORAL_TABLET | ORAL | 1 refills | Status: DC

## 2024-09-06 NOTE — Progress Notes (Signed)
 Description   INR-2.3; Continue taking warfarin 1/2 a tablet daily except for 1 tablet on SUNDAYS AND WEDNESDAYS.  Recheck INR in 5 weeks  Stay consistent with greens.  C[facility]in  Clinic [phone]

## 2024-09-06 NOTE — Progress Notes (Signed)
   Follow-Up Visit   Subjective  Kenneth Ryan is a 78 y.o. male who presents for the following: Skin Cancer Screening and Upper Body Skin Exam.  The patient presents for Upper Body Skin Exam (UBSE) for skin cancer screening and mole check. The patient has spots, moles and lesions to be evaluated, some may be new or changing.  Patient has no concerns today.   The following portions of the chart were reviewed this encounter and updated as appropriate: medications, allergies, medical history  Review of Systems:  No other skin or systemic complaints except as noted in HPI or Assessment and Plan.  Objective  Well appearing patient in no apparent distress; mood and affect are within normal limits.  All skin waist up examined. Relevant physical exam findings are noted in the Assessment and Plan.    Assessment & Plan  Skin cancer screening performed today.  Actinic Damage - Chronic condition, secondary to cumulative UV/sun exposure - diffuse scaly erythematous macules with underlying dyspigmentation - Recommend daily broad spectrum sunscreen SPF 30+ to sun-exposed areas, reapply every 2 hours as needed.  - Staying in the shade or wearing long sleeves, sun glasses (UVA+UVB protection) and wide brim hats (4-inch brim around the entire circumference of the hat) are also recommended for sun protection.  - Call for new or changing lesions.  Lentigines, Seborrheic Keratoses, Hemangiomas - Benign normal skin lesions - Benign-appearing - Call for any changes  Melanocytic Nevi - Tan-brown and/or pink-flesh-colored symmetric macules and papules - Benign appearing on exam today - Observation - Call clinic for new or changing moles - Recommend daily use of broad spectrum spf 30+ sunscreen to sun-exposed areas.   HISTORY OF BASAL CELL CARCINOMA OF THE SKIN - No evidence of recurrence today - Recommend regular full body skin exams - Recommend daily broad spectrum sunscreen SPF 30+ to  sun-exposed areas, reapply every 2 hours as needed.  - Call if any new or changing lesions are noted between office visits  Sebaceous Hyperplasia- left cheek - Small yellow papules with a central dell - Benign-appearing - Observe. Call for changes.   Return in about 6 months (around 03/06/2025) for UBSE.  I, Rollene Gobble, RN, am acting as scribe for RUFUS CHRISTELLA HOLY, MD .   Documentation: I have reviewed the above documentation for accuracy and completeness, and I agree with the above.  RUFUS CHRISTELLA HOLY, MD

## 2024-09-06 NOTE — Patient Instructions (Signed)
 Description   INR-2.3; Continue taking warfarin 1/2 a tablet daily except for 1 tablet on SUNDAYS AND WEDNESDAYS.  Recheck INR in 5 weeks  Stay consistent with greens.  Coumadin  Clinic [phone]

## 2024-09-06 NOTE — Progress Notes (Signed)
 Cardiology Office Note:    Date:  09/06/2024  ID:  Kenneth Ryan, DOB October 17, 1946, MRN [id number] PCP: Candise Aleene DEL, MD  [facility] Providers Cardiologist:  Oneil Parchment, MD Cardiology APP:  Rana Lum CROME, NP       Patient Profile:       Chief Complaint: 71-month follow-up for low blood pressures History of Present Illness:  Kenneth Ryan is a 78 y.o. 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  Postoperatively his ejection fraction improved from 35% to 45-50%.  He is on chronic anticoagulation therapy due to his mechanical aortic valve and experienced a right brachial thrombus in 2024 following a heparin  hold for prostate procedure.  Echocardiogram 12/2022 showed LVEF 55 to 60%, no RWMA, grade 1 DD, RV function and size normal, prosthetic annuloplasty ring present in mitral position with findings consistent with normal structure and function of mitral valve prosthesis,, findings consistent with normal structure and function of aortic valve prosthesis.  He was last seen in clinic on 06/08/2024 by Dr. Parchment.  He reported bilateral ankle swelling over the past 2 months which he manages with furosemide  as needed.  Recently he had been taking it more regularly due to increased swelling.  He reported occasional low systolic blood pressure readings in the 80s.  He was concerned about potential hypotension due to fluid loss from diuretic use.  His irbesartan  dosage  was reduced to 150 mg daily.  He was also to begin taking his furosemide  20 mg daily and as needed due to his peripheral edema.  On 8/11 he called in with low blood pressure readings as low as 62/38.  Has irbesartan  was decreased to 75 mg daily.   Discussed the use of AI scribe software for clinical note transcription with the patient, who gave verbal consent to proceed.  History of Present Illness Kenneth Ryan is a 78 year old male who presents for follow-up after medication adjustments.   Today patient is doing well overall.  He reports his leg swelling has significantly improved.  However he does note that in between seeing Dr. Parchment and his follow-up visit he had followed up with his primary care provider who had actually increased his furosemide  to 40 mg daily.  He reports since this medication change his leg swelling has entirely resolved.  He denies any chest pains, orthopnea, PND, dyspnea.  Today he brings in his daily blood pressure log.  Blood pressure remains low normal, averaging 90s - 100s over 50s, with heart rates as low as 48 bpm and average heart rate of around 52 bpm.. No dizziness, lightheadedness, syncope, presyncope, palpitations, or falls have occurred since the medication change.  Surprisingly his only symptom has been mild fatigue which is been present since valve surgery in 2017.  He does remain active without any exertional chest pains or dyspnea. He is concerned about the potential impact of increased furosemide  on kidney function.  He continues to have good urine output.  Review of systems:  Please see the history of present illness. All  other systems are reviewed and otherwise negative.      Studies Reviewed:        Echocardiogram 12/28/2022 1. Left ventricular ejection fraction, by estimation, is 55 to 60%. The  left ventricle has normal function. The left ventricle has no regional  wall motion abnormalities. Left ventricular diastolic parameters are   consistent with Grade I diastolic  dysfunction (impaired relaxation).   2. Right ventricular systolic function is normal. The right ventricular  size is normal. There is normal pulmonary artery systolic pressure. The  estimated right ventricular systolic pressure is 21.5 mmHg.   3. The mitral valve has been repaired/replaced. No evidence of mitral  valve regurgitation. No evidence of mitral stenosis. The mean mitral valve  gradient is 6.0 mmHg. There is a prosthetic annuloplasty ring present in  the mitral position. Procedure  Date: 11/03/16. Echo findings are consistent with normal structure and  function of the mitral valve prosthesis.   4. The aortic valve has been repaired/replaced. Aortic valve  regurgitation is not visualized. No aortic stenosis is present. There is a  mechanical valve present in the aortic position. Procedure Date: 11/03/16.  Echo findings are consistent with normal  structure and function of the aortic valve prosthesis.   5. Aortic root/ascending aorta has been repaired/replaced.   6. The inferior vena cava is normal in size with greater than 50%  respiratory variability, suggesting right atrial pressure of 3 mmHg.   Echocardiogram 05/07/2022 1. Left ventricular ejection fraction, by estimation, is 55 to 60%. The  left ventricle has normal function. The left ventricle has no regional  wall motion abnormalities. Left ventricular diastolic parameters are  indeterminate.   2. Right ventricular systolic function is normal. The right ventricular  size is normal. There is normal pulmonary artery systolic pressure.   3. Left atrial size was mild to moderately dilated.   4. The mitral valve has been repaired/replaced. No evidence of mitral  valve regurgitation. No evidence of mitral stenosis. The mean mitral valve  gradient is 2.5 mmHg. There is a present in the mitral position. Procedure  Date: 11/03/2016.   5. The aortic valve has been repaired/replaced. Aortic  valve  regurgitation is not visualized. No aortic stenosis is present. There is a  valve present in the aortic position. Procedure Date: 11/03/2016. Echo  findings are consistent with normal structure  and function of the aortic valve prosthesis.   6. Aortic root/ascending aorta has been repaired/replaced.   7. The inferior vena cava is normal in size with greater than 50%  respiratory variability, suggesting right atrial pressure of 3 mmHg.   Echocardiogram 11/28/2020 1. Left ventricular ejection fraction, by estimation, is 50 to 55%. The  left ventricle has low normal function. The left ventricle has no regional  wall motion abnormalities. The left ventricular internal cavity size was  mildly dilated. Left ventricular  diastolic parameters were normal.   2. Right ventricular systolic function is normal. The right ventricular  size is normal.   3. Left atrial size was moderately dilated.   4. The mitral valve is degenerative. No evidence of mitral valve  regurgitation. Mild mitral stenosis. Moderate mitral annular  calcification.   5. Post AVR mechanical bi leaflet stable gradients since 2018 would bring  patient back for further interrogation of color flow. See image 68 cannot  tell if moderate PVL vs mitral inflow similar appearance in 2018 PVL not  appreciated in PSL views but  prone to get shadowing artifact in these views .  The aortic valve has been  repaired/replaced. Aortic valve regurgitation see below. No aortic  stenosis is present.   6. The inferior vena cava is normal in size with greater than 50%  respiratory variability, suggesting right atrial pressure of 3 mmHg.  Risk Assessment/Calculations:              Physical Exam:   VS:  BP (!) 104/56 (BP Location: Left Arm, Patient Position: Sitting, Cuff Size: Normal)   Pulse (!) 55   Ht 6' (1.829 m)   Wt 212 lb (96.2 kg)   BMI 28.75 kg/m    Wt Readings from Last 3 Encounters:  09/06/24 212 lb (96.2 kg)  05/05/24  220 lb 12.8 oz (100.2 kg)  11/12/23 220 lb 12.8 oz (100.2 kg)    GEN: Well nourished, well developed in no acute distress NECK: No JVD; No carotid bruits CARDIAC: Mechanical click. RRR, no murmurs, rubs, gallops RESPIRATORY:  Clear to auscultation without rales, wheezing or rhonchi  ABDOMEN: Soft, non-tender, non-distended EXTREMITIES:  No edema; No acute deformity      Assessment and Plan:  Hypertension Blood pressure today is 104/56 Has been dealing with some hypotension recently with irbesartan  decreased to 150 mg on 6/19 and then 75 mg on 8/11 His furosemide  was also increased from 20 to 40 mg daily by his PCP sometime between his last office visit and today due to lower extremity swelling which has entirely resolved He brings in his blood pressure log today showing average home BPs between 90s-100s systolic and 40s-50s diastolic.  Average heart rate between 50 to 55 - Overall, he has been entirely asymptomatic without syncope, presyncope, lightheadedness, or dizziness.  Does have some chronic fatigue since 2017.  Maintains fairly active lifestyle without any exertional symptoms - Plan to decrease metoprolol  succinate from 50 mg daily to 25 mg daily to manage his blood pressure and heart rate more effectively - Plan to decrease furosemide  from 40 mg daily to 20 mg daily where he could take an extra 20 mg as needed for lower extremity swelling - Current regimen including furosemide  20 mg daily, irbesartan  75 mg daily, metoprolol  succinate 25 mg - Blood pressure logs given today to bring into next office visit  Dilated cardiomyopathy Peripheral edema EF originally 35%, improved to 45%.  Eventually improved to 50 to 55% Most recent echocardiogram 12/2022 with LVEF 55 to 60%, no RWMA, grade 1 DD Leg swelling has entirely resolved with daily diuretic use and lifestyle modifications - Plan to decrease furosemide  from 40 mg daily to 20 mg daily with the option to take an extra 20 mg as needed  for leg swelling as noted above - Encouraged compression socks, elevating legs, low-sodium diet, maintaining an active lifestyle - BMET today (creatinine 1.36, GFR 54 on 7/7)  S/p Bentall aortic root replacement and mitral valve repair S/p mechanical AVR and Bentall procedure in November 2017 along with mitral valve repair due to bicuspid aortic valve with severe aortic regurgitation - Today he remains asymptomatic without exertional symptoms.  No dyspnea, chest pains, syncope/presyncope, lightheadedness or dizziness.  Maintains fairly active lifestyle without limitations - Plan for repeat echocardiogram 12/2024 for routine monitoring - Continue Coumadin  per Coumadin  clinic.  INR goal of 2-2.5      Dispo:  Return in about 4 months (around 01/06/2025).  Signed, Lum LITTIE Louis, NP

## 2024-09-06 NOTE — Patient Instructions (Addendum)
 Medication Instructions:  DECREASE METOPROLOL  SUCCINATE TO 25 MG DAILY. TAKE LASIX  20 MG DAILY. YOU MAY TAKE AN ADDITIONAL 20 MG (1 TABLET) AS NEEDED TO LEG EDEMA (SWELLING).   Lab Work: BMET TO BE DONE TODAY.   Testing/Procedures: TO BE SCHEDULED FOR JANUARY 2026.  Your physician has requested that you have an ECHOCARDIOGRAM. Echocardiography is a painless test that uses sound waves to create images of your heart. It provides your doctor with information about the size and shape of your heart and how well your heart's chambers and valves are working. This procedure takes approximately one hour. There are no restrictions for this procedure. Please do NOT wear cologne, perfume, aftershave, or lotions (deodorant is allowed). Please arrive 15 minutes prior to your appointment time.  Please note: We ask at that you not bring children with you during ultrasound (echo/ vascular) testing. Due to room size and safety concerns, children are not allowed in the ultrasound rooms during exams. Our front office staff cannot provide observation of children in our lobby area while testing is being conducted. An adult accompanying a patient to their appointment will only be allowed in the ultrasound room at the discretion of the ultrasound technician under special circumstances. We apologize for any inconvenience.   Follow-Up: At [facility], you and your health needs are our priority.  As part of our continuing mission to provide you with exceptional heart care, our providers are all part of one team.  This team includes your primary Cardiologist (physician) and Advanced Practice Providers or APPs (Physician Assistants and Nurse Practitioners) who all work together to provide you with the care you need, when you need it.  Your next appointment:   4 MONTHS POST ECHOCARDIOGRAM   Provider:   Oneil Parchment, MD OR Lum Louis, NP

## 2024-09-06 NOTE — Patient Instructions (Signed)

## 2024-09-07 ENCOUNTER — Ambulatory Visit: Payer: Self-pay | Admitting: Emergency Medicine

## 2024-09-07 LAB — BASIC METABOLIC PANEL WITH GFR
BUN/Creatinine Ratio: 16 (ref 10–24)
BUN: 21 mg/dL (ref 8–27)
CO2: 26 mmol/L (ref 20–29)
Calcium: 9.8 mg/dL (ref 8.6–10.2)
Chloride: 99 mmol/L (ref 96–106)
Creatinine, Ser: 1.34 mg/dL — ABNORMAL HIGH (ref 0.76–1.27)
Glucose: 96 mg/dL (ref 70–99)
Potassium: 4.7 mmol/L (ref 3.5–5.2)
Sodium: 139 mmol/L (ref 134–144)
eGFR: 55 mL/min/1.73 — ABNORMAL LOW (ref >59)

## 2024-09-18 DIAGNOSIS — H25012 Cortical age-related cataract, left eye: Secondary | ICD-10-CM | POA: Diagnosis not present

## 2024-09-18 DIAGNOSIS — H2512 Age-related nuclear cataract, left eye: Secondary | ICD-10-CM | POA: Diagnosis not present

## 2024-09-18 DIAGNOSIS — H25042 Posterior subcapsular polar age-related cataract, left eye: Secondary | ICD-10-CM | POA: Diagnosis not present

## 2024-10-11 ENCOUNTER — Ambulatory Visit: Attending: Cardiology | Admitting: *Deleted

## 2024-10-11 DIAGNOSIS — C61 Malignant neoplasm of prostate: Secondary | ICD-10-CM | POA: Diagnosis not present

## 2024-10-11 DIAGNOSIS — Z9889 Other specified postprocedural states: Secondary | ICD-10-CM | POA: Diagnosis not present

## 2024-10-11 DIAGNOSIS — Z5181 Encounter for therapeutic drug level monitoring: Secondary | ICD-10-CM | POA: Diagnosis not present

## 2024-10-11 DIAGNOSIS — Q2543 Congenital aneurysm of aorta: Secondary | ICD-10-CM

## 2024-10-11 DIAGNOSIS — Z954 Presence of other heart-valve replacement: Secondary | ICD-10-CM

## 2024-10-11 LAB — POCT INR: INR: 1.9 — AB (ref 2.0–3.0)

## 2024-10-11 NOTE — Progress Notes (Signed)
 Description   INR-1.9; Today take another 1/2 tablet of warfarin then continue taking taking 1 tablet on SUNDAYS AND WEDNESDAYS.  Recheck INR in 5 weeks.  Stay consistent with greens.  Coumadin  Clinic [phone]

## 2024-10-11 NOTE — Patient Instructions (Addendum)
 Description   INR-1.9; Today take another 1/2 tablet of warfarin then continue taking taking 1 tablet on SUNDAYS AND WEDNESDAYS.  Recheck INR in 5 weeks.  Stay consistent with greens.  Coumadin  Clinic [phone]

## 2024-10-18 DIAGNOSIS — R399 Unspecified symptoms and signs involving the genitourinary system: Secondary | ICD-10-CM | POA: Diagnosis not present

## 2024-10-18 DIAGNOSIS — R9721 Rising PSA following treatment for malignant neoplasm of prostate: Secondary | ICD-10-CM | POA: Diagnosis not present

## 2024-10-18 DIAGNOSIS — N5201 Erectile dysfunction due to arterial insufficiency: Secondary | ICD-10-CM | POA: Diagnosis not present

## 2024-10-18 DIAGNOSIS — C61 Malignant neoplasm of prostate: Secondary | ICD-10-CM | POA: Diagnosis not present

## 2024-11-13 ENCOUNTER — Telehealth: Payer: Self-pay | Admitting: Cardiology

## 2024-11-13 MED ORDER — PANTOPRAZOLE SODIUM 40 MG PO TBEC
40.0000 mg | DELAYED_RELEASE_TABLET | Freq: Every day | ORAL | 3 refills | Status: AC
Start: 2024-11-13 — End: ?

## 2024-11-13 NOTE — Telephone Encounter (Signed)
 Refills has been sent to the pharmacy.

## 2024-11-13 NOTE — Telephone Encounter (Signed)
*  STAT* If patient is at the pharmacy, call can be transferred to refill team.   1. Which medications need to be refilled? (please list name of each medication and dose if known)   pantoprazole  (PROTONIX ) 40 MG tablet     2. Would you like to learn more about the convenience, safety, & potential cost savings by using the [facility]? No      3. Are you open to using the [facility] (Type [facility]. No    4. Which pharmacy/location (including street and city if local pharmacy) is medication to be sent to?[id number] - [place], [place] - [address] [place]    5. Do they need a 30 day or 90 day supply? 90 day

## 2024-11-14 ENCOUNTER — Ambulatory Visit (INDEPENDENT_AMBULATORY_CARE_PROVIDER_SITE_OTHER): Payer: PPO | Admitting: Family Medicine

## 2024-11-14 ENCOUNTER — Encounter: Payer: Self-pay | Admitting: Family Medicine

## 2024-11-14 VITALS — BP 115/63 | HR 52 | Temp 97.7°F | Ht 71.75 in | Wt 213.6 lb

## 2024-11-14 DIAGNOSIS — I10 Essential (primary) hypertension: Secondary | ICD-10-CM | POA: Diagnosis not present

## 2024-11-14 DIAGNOSIS — Z0000 Encounter for general adult medical examination without abnormal findings: Secondary | ICD-10-CM

## 2024-11-14 DIAGNOSIS — E781 Pure hyperglyceridemia: Secondary | ICD-10-CM | POA: Diagnosis not present

## 2024-11-14 DIAGNOSIS — R600 Localized edema: Secondary | ICD-10-CM

## 2024-11-14 DIAGNOSIS — M19079 Primary osteoarthritis, unspecified ankle and foot: Secondary | ICD-10-CM

## 2024-11-14 DIAGNOSIS — R7301 Impaired fasting glucose: Secondary | ICD-10-CM

## 2024-11-14 DIAGNOSIS — M180 Bilateral primary osteoarthritis of first carpometacarpal joints: Secondary | ICD-10-CM

## 2024-11-14 LAB — CBC WITH DIFFERENTIAL/PLATELET
Basophils Absolute: 0 K/uL (ref 0.0–0.1)
Basophils Relative: 0.7 % (ref 0.0–3.0)
Eosinophils Absolute: 0.1 K/uL (ref 0.0–0.7)
Eosinophils Relative: 1.9 % (ref 0.0–5.0)
HCT: 43.5 % (ref 39.0–52.0)
Hemoglobin: 14.4 g/dL (ref 13.0–17.0)
Lymphocytes Relative: 39.1 % (ref 12.0–46.0)
Lymphs Abs: 1.7 K/uL (ref 0.7–4.0)
MCHC: 33 g/dL (ref 30.0–36.0)
MCV: 86.3 fl (ref 78.0–100.0)
Monocytes Absolute: 0.3 K/uL (ref 0.1–1.0)
Monocytes Relative: 7.7 % (ref 3.0–12.0)
Neutro Abs: 2.2 K/uL (ref 1.4–7.7)
Neutrophils Relative %: 50.6 % (ref 43.0–77.0)
Platelets: 185 K/uL (ref 150.0–400.0)
RBC: 5.04 Mil/uL (ref 4.22–5.81)
RDW: 14.3 % (ref 11.5–15.5)
WBC: 4.3 K/uL (ref 4.0–10.5)

## 2024-11-14 LAB — COMPREHENSIVE METABOLIC PANEL WITH GFR
ALT: 16 U/L (ref 0–53)
AST: 28 U/L (ref 0–37)
Albumin: 4.1 g/dL (ref 3.5–5.2)
Alkaline Phosphatase: 41 U/L (ref 39–117)
BUN: 19 mg/dL (ref 6–23)
CO2: 35 meq/L — ABNORMAL HIGH (ref 19–32)
Calcium: 9.6 mg/dL (ref 8.4–10.5)
Chloride: 105 meq/L (ref 96–112)
Creatinine, Ser: 1.15 mg/dL (ref 0.40–1.50)
GFR: 61.82 mL/min (ref >60.00)
Glucose, Bld: 105 mg/dL — ABNORMAL HIGH (ref 70–99)
Potassium: 4.2 meq/L (ref 3.5–5.1)
Sodium: 142 meq/L (ref 135–145)
Total Bilirubin: 0.6 mg/dL (ref 0.2–1.2)
Total Protein: 6.5 g/dL (ref 6.0–8.3)

## 2024-11-14 LAB — LIPID PANEL
Cholesterol: 154 mg/dL (ref 0–200)
HDL: 43.3 mg/dL (ref >39.00)
LDL Cholesterol: 91 mg/dL (ref 0–99)
NonHDL: 110.35
Total CHOL/HDL Ratio: 4
Triglycerides: 96 mg/dL (ref 0.0–149.0)
VLDL: 19.2 mg/dL (ref 0.0–40.0)

## 2024-11-14 LAB — HEMOGLOBIN A1C: Hgb A1c MFr Bld: 5.5 % (ref 4.6–6.5)

## 2024-11-14 MED ORDER — FENOFIBRATE 145 MG PO TABS: 145.0000 mg | ORAL_TABLET | Freq: Every day | ORAL | 3 refills | Status: AC

## 2024-11-14 NOTE — Progress Notes (Addendum)
 Office Note 11/14/2024  CC:  Chief Complaint  Patient presents with   Annual Exam    Pt is fasting    HPI:  Patient is a 78 y.o. male who is here for annual health maintenance exam and follow-up hypertension, hypertriglyceridemia, and impaired fasting glucose.  In August Dr. Jeffrie decreased his blood pressure medication to 75 mg irbesartan  daily.  His Toprol  XL was decreased to 25 mg daily. He was gradually accumulating fluid so he was started on Lasix  and an echocardiogram was ordered.  Tim feels well other than bilat thumb and bilat big toe pain. Describes stiffness and occ sharp pains.  Has come on gradually.  No swelling or redness or heat. No preceding injury.  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  Right +brach,rad,ulnar-->occured while he was briefly off coumadin -->thrombectomy by vasc surg   Chronic hoarseness    secondary to hx of laryngeal surgery   GERD (gastroesophageal reflux disease)    Barrett's esoph on EGD 06/06/21   History of arterial thrombosis    brachial,radial,ulnar 12/2022 when off coumadin  for brachytherapy--->embolectomy-->ASA +coumadin  indef   History of basal cell carcinoma    nose   History of laryngeal cancer 2002   Surgery and radiation completed same year 2002---  oncologist released pt summer 2014   (benign bx by dr elvina in 10/ 2016)   History of lower GI bleeding 05/2021   s/p   EGD w/some non bleeding gastritis, Colonoscopy with int/ext hemorrhoids, 1 polyp   Hyperlipidemia, mixed    Malignant neoplasm prostate (HCC) 07/2022   urologist-- wrenn/  radiation oncologist--- dr patrcia;  dx 08/ 2023,  Gleason 3+4   Nonischemic cardiomyopathy (HCC) 08/2016   followed by cardiologist-  dr jeffrie;    per echo ef 40-45%  09/ 2017;   echo post surgery in  2017  ef 35%;  improved 2019 ;  last echo 05-07-2022  ef 55-60%   Pre-diabetes    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  Per CV surg, as of 02/16/17, his INR goal is 1.5-2.5.   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   Severe Mitral regurg;   s/p  Complex valvuloplasty including artificial Gore-tex neochord placement x6 and 28 mm Sorin Memo 3D ring annuloplasty   SOB (shortness of breath) on exertion    normal activities ok but more strenous acitivity sob which is baseline per cardiologist note   Wears glasses     Past Surgical History:  Procedure Laterality Date   BENTALL PROCEDURE N/A 11/03/2016   Procedure: BENTALL PROCEDURE AORTIC ROOT REPLACEMENT + mechanical AV;  Surgeon: Sudie VEAR Laine, MD;  Location: [facility] OR;  Service: Open Heart Surgery;  Laterality: N/A;   BIOPSY  06/05/2021   Procedure: BIOPSY;  Surgeon: Eda Iha, MD;  Location: [facility];  Service: Gastroenterology;;   CARDIAC CATHETERIZATION N/A 09/29/2016   Clean coronaries, EF 60-65%.  Procedure: Right/Left Heart Cath and Coronary Angiography;  Surgeon: Candyce GORMAN Reek, MD;  Location: [facility] INVASIVE  CV LAB;  Service: Cardiovascular;  Laterality: N/A;   CARDIAC VALVE REPLACEMENT  2017   CHOLECYSTECTOMY  1980   COLONOSCOPY WITH PROPOFOL  N/A 06/05/2021   diverticulosis descending and sigmoid, int+ext nonbleeding hemorr, 1 sessile seratted polyp w/out cytologic dysplasia. Procedure: COLONOSCOPY WITH PROPOFOL ;  Surgeon: Eda Iha, MD;  Location: [facility];  Service: Gastroenterology;  Laterality: N/A;   DIRECT LARYNGOSCOPY Left 10/17/2015   Procedure: MICRO DIRECT LARYNGOSCOPY WITH FROZEN SECTION;  Surgeon: Marlyce Finer, MD;  Location: [facility] OR;  Service: ENT;  Laterality: Left;   EMBOLECTOMY Right 12/27/2022   Procedure: RIGHT BRACHIAL, ULNAR, AND RADIAL ARTERY EMBOLECTOMY;  Surgeon: Lanis Fonda BRAVO, MD;  Location: [facility] OR;  Service: Vascular;  Laterality: Right;   ESOPHAGOGASTRODUODENOSCOPY (EGD) WITH PROPOFOL  N/A 06/05/2021   Barrett's esoph.  Gastritis (h pylori neg), multiple benign gastric polyps. Procedure: ESOPHAGOGASTRODUODENOSCOPY (EGD) WITH PROPOFOL ;  Surgeon: Eda Iha, MD;  Location: [facility];  Service: Gastroenterology;  Laterality: N/A;   LARYNX SURGERY  2002   removal vocal cord tumor (cancer)   MITRAL VALVE REPAIR N/A 11/03/2016   Procedure: MITRAL VALVE REPAIR (MVR);  Surgeon: Sudie VEAR Laine, MD;  Location: [facility] OR;  Service: Open Heart Surgery;  Laterality: N/A;   PERIPHERAL VASCULAR CATHETERIZATION N/A 09/29/2016   Procedure: Abdominal Aortogram;  Surgeon: Candyce GORMAN Reek, MD;  Location: [facility] INVASIVE CV LAB;  Service: Cardiovascular;  Laterality: N/A;   PERIPHERAL VASCULAR CATHETERIZATION N/A 09/29/2016   Procedure: Thoracic Aortogram;  Surgeon: Candyce GORMAN Reek, MD;  Location: [facility] INVASIVE CV LAB;  Service: Cardiovascular;  Laterality: N/A;   POLYPECTOMY  06/05/2021   Procedure: POLYPECTOMY;  Surgeon: Eda Iha, MD;  Location: [facility];  Service: Gastroenterology;;   RADIOACTIVE SEED IMPLANT N/A 12/25/2022   Procedure: RADIOACTIVE SEED IMPLANT/BRACHYTHERAPY IMPLANT;  Surgeon: Watt Rush, MD;  Location: [facility];  Service: Urology;  Laterality: N/A;   SPACE OAR INSTILLATION N/A 12/25/2022   Procedure: SPACE OAR INSTILLATION;  Surgeon: Watt Rush, MD;  Location: [facility];  Service: Urology;  Laterality: N/A;   TEE WITHOUT CARDIOVERSION N/A 09/17/2016   Procedure: TRANSESOPHAGEAL ECHOCARDIOGRAM (TEE);  Surgeon: Oneil JAYSON Parchment, MD;  Location: [facility] ENDOSCOPY;  Service: Cardiovascular;  Laterality: N/A;   TEE WITHOUT CARDIOVERSION  N/A 11/03/2016   Procedure: TRANSESOPHAGEAL ECHOCARDIOGRAM (TEE);  Surgeon: Sudie VEAR Laine, MD;  Location: [facility] OR;  Service: Open Heart Surgery;  Laterality: N/A;   TONSILLECTOMY AND ADENOIDECTOMY     child   TRANSESOPHAGEAL ECHOCARDIOGRAM  09/17/2016   Severe MR with ruptured chordae.  Mod/severe AR w/functional bicuspid AV.  EF 45%.    Family History  Problem Relation Age of Onset   Heart disease Father    Breast cancer Mother    Cancer Mother    Breast cancer Sister    Diabetes Brother    Kidney failure Son    Breast cancer Sister     Social History   Socioeconomic History   Marital status: Widowed    Spouse name: Not on file   Number of children: 1   Years of education: Not on file   Highest education level: Associate degree: academic program  Occupational History   Occupation: retired  Tobacco Use   Smoking status: Former    Current packs/day: 0.00    Types: Cigarettes    Start date: 07/22/1967    Quit date: 07/21/1989    Years since quitting: 35.3   Smokeless tobacco: Never  Vaping Use   Vaping status: Never Used  Substance and Sexual Activity   Alcohol use: Yes    Comment: rare   Drug use: Never   Sexual activity: Not on file  Other Topics Concern   Not on file  Social History Narrative   Widower (approx 31), one son deceased 12-11-2011.   Relocated from [place] to [facility] 06/2013 to be near grandchildren.   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   Smoker 20 pack-yr hx: quit about 1980.   Alcohol: 3 bottles of beer per week avg.     Drugs: none         Social Drivers of Corporate Investment Banker Strain: Low Risk  (04/19/2024)   Overall Financial Resource Strain (CARDIA)    Difficulty of Paying Living Expenses: Not hard at all  Food Insecurity: No Food Insecurity (04/19/2024)   Hunger Vital Sign    Worried About Running Out of Food in the Last Year: Never true    Ran Out of Food in the Last Year: Never true  Transportation Needs: No Transportation Needs (04/19/2024)    PRAPARE - Administrator, Civil Service (Medical): No    Lack of Transportation (Non-Medical): No  Physical Activity: Insufficiently Active (04/19/2024)   Exercise Vital Sign    Days of Exercise per Week: 1 day    Minutes of Exercise per Session: 10 min  Stress: No Stress Concern Present (04/19/2024)   [employer] of Occupational Health - Occupational Stress Questionnaire    Feeling of Stress : Only a little  Social Connections: Moderately Integrated (04/19/2024)   Social Connection and Isolation Panel    Frequency of Communication with Friends and Family: More than three times a week    Frequency of Social Gatherings with Friends and Family: More than three times a week    Attends Religious Services: More than 4 times per year    Active Member of [employer] or Organizations: Yes    Attends Banker Meetings: More than 4 times per year    Marital Status: Widowed  Intimate Partner Violence: Not At Risk (04/19/2024)   Humiliation, Afraid, Rape, and Kick questionnaire    Fear of Current or Ex-Partner: No    Emotionally Abused: No    Physically Abused: No    Sexually Abused: No    Outpatient Medications Prior to Visit  Medication Sig Dispense Refill   acetaminophen  (TYLENOL ) 500 MG tablet Take 500-1,000 mg by mouth every 6 (six) hours as needed for mild pain (depends on pain level if takes 500 mg or 1000 mg).     aspirin  EC 81 MG EC tablet Take 1 tablet (81 mg total) by mouth daily.     furosemide  (LASIX ) 20 MG tablet TAKE 20 MG (1 TABLET) DAILY. MAY TAKE AN ADDITIONAL 20 MG (1 TABLET) TO TOTAL 40 MG AS NEEDED FOR LEG SWELLING. 180 tablet 1   irbesartan  (AVAPRO ) 75 MG tablet Take 1 tablet (75 mg total) by mouth at bedtime. 90 tablet 3   metoprolol  succinate (TOPROL -XL) 25 MG 24 hr tablet Take 1 tablet (25 mg total) by mouth daily. 90 tablet 1   Multiple Vitamins-Minerals (MULTIVITAMIN PO) Take 1 tablet by mouth daily.      pantoprazole  (PROTONIX ) 40 MG tablet Take  1 tablet (40 mg total) by mouth daily. 90 tablet 3   warfarin (COUMADIN ) 5 MG tablet TAKE 1/2 TABLET TO 1 TABLET DAILY AS DIRECTED BY THE COUMADIN  CLINIC. 90 tablet 1   fenofibrate  (TRICOR ) 145 MG tablet TAKE 1 TABLET (145  MG TOTAL) BY MOUTH DAILY. 90 tablet 1   mupirocin  ointment (BACTROBAN ) 2 % Apply 1 Application topically 3 (three) times daily. (Patient not taking: Reported on 11/14/2024) 22 g 0   No facility-administered medications prior to visit.    No Known Allergies  Review of Systems  Constitutional:  Negative for appetite change, chills, fatigue and fever.  HENT:  Negative for congestion, dental problem, ear pain and sore throat.   Eyes:  Negative for discharge, redness and visual disturbance.  Respiratory:  Negative for cough, chest tightness, shortness of breath and wheezing.   Cardiovascular:  Negative for chest pain, palpitations and leg swelling.  Gastrointestinal:  Negative for abdominal pain, blood in stool, diarrhea, nausea and vomiting.  Genitourinary:  Negative for difficulty urinating, dysuria, flank pain, frequency, hematuria and urgency.  Musculoskeletal:  Positive for arthralgias (bilat thumbs and bilat big toes). Negative for back pain, joint swelling, myalgias and neck stiffness.  Skin:  Negative for pallor and rash.  Neurological:  Negative for dizziness, speech difficulty, weakness and headaches.  Hematological:  Negative for adenopathy. Does not bruise/bleed easily.  Psychiatric/Behavioral:  Negative for confusion and sleep disturbance. The patient is not nervous/anxious.    PE;    11/14/2024    7:59 AM 09/06/2024    1:48 PM 09/06/2024   11:13 AM  Vitals with BMI  Height 5' 11.75  6' 0  Weight 213 lbs 10 oz  212 lbs  BMI 29.19  28.75  Systolic 115 144 895  Diastolic 63 45 56  Pulse 52 50 55    Gen: Alert, well appearing.  Patient is oriented to person, place, time, and situation. AFFECT: pleasant, lucid thought and speech. ENT: Ears: EACs clear,  normal epithelium.  TMs with good light reflex and landmarks bilaterally.  Eyes: no injection, icteris, swelling, or exudate.  EOMI, PERRLA. Nose: no drainage or turbinate edema/swelling.  No injection or focal lesion.  Mouth: lips without lesion/swelling.  Oral mucosa pink and moist.  Dentition intact and without obvious caries or gingival swelling.  Oropharynx without erythema, exudate, or swelling.  Neck: supple/nontender.  No LAD, mass, or TM.  Carotid pulses 2+ bilaterally, without bruits. CV: RRR, no m/r/g.   LUNGS: CTA bilat, nonlabored resps, good aeration in all lung fields. ABD: soft, NT, ND, BS normal.  No hepatospenomegaly or mass.  No bruits. EXT: no clubbing or cyanosis.  2+ pitting edema bilat LL's Musculoskeletal: no joint swelling, erythema, warmth, or tenderness.  ROM of all joints intact. Skin - no sores or suspicious lesions or rashes or color changes  Pertinent labs:  Lab Results  Component Value Date   TSH 1.68 05/05/2024   Lab Results  Component Value Date   WBC 5.6 05/05/2024   HGB 13.6 05/05/2024   HCT 42.2 05/05/2024   MCV 90.4 05/05/2024   PLT 188 05/05/2024   Lab Results  Component Value Date   CREATININE 1.34 (H) 09/06/2024   BUN 21 09/06/2024   NA 139 09/06/2024   K 4.7 09/06/2024   CL 99 09/06/2024   CO2 26 09/06/2024   Lab Results  Component Value Date   ALT 17 11/12/2023   AST 27 11/12/2023   ALKPHOS 50 11/12/2023   BILITOT 0.8 11/12/2023   Lab Results  Component Value Date   CHOL 165 11/12/2023   Lab Results  Component Value Date   HDL 33.60 (L) 11/12/2023   Lab Results  Component Value Date   LDLCALC 104 (H) 11/12/2023   Lab  Results  Component Value Date   TRIG 136.0 11/12/2023   Lab Results  Component Value Date   CHOLHDL 5 11/12/2023   Lab Results  Component Value Date   PSA 4.49 11/09/2022   PSA 4.87 04/29/2022   PSA 4.58 10/20/2021   PSA 4.58 10/20/2021   Lab Results  Component Value Date   HGBA1C 5.7 (H)  05/05/2024   ASSESSMENT AND PLAN:   1 health maintenance exam: Reviewed age and gender appropriate health maintenance issues (prudent diet, regular exercise, health risks of tobacco and excessive alcohol, use of seatbelts, fire alarms in home, use of sunscreen).  Also reviewed age and gender appropriate health screening as well as vaccine recommendations. Vaccines: All up-to-date Labs: CBC, c-Met, lipids, hemoglobin A1c (IFG). Prostate ca screening: PSAs followed by Dr. Watt Colon ca screening: Colonoscopy 05/2021 with polyp x1.  Anticipate recall 5 years.  #2 hypertension, doing well on irbesartan  75 mg a day and Toprol -XL 25 mg a day. Monitor electrolytes and creatinine today.  3.  Hypertriglyceridemia, doing well on fenofibrate  145 mg a day. Monitor lipid panel and hepatic panel today.  4.  Osteoarthritis CMC and MTP joints bilaterally. Tylenol  500 to 1000 mg every 6 hours as needed.  #5 impaired fasting glucose. Fasting glucose and hemoglobin A1c checked today.  #6 bilateral lower extremity edema. Stable on Lasix  20 mg a day and sodium limitation.  An After Visit Summary was printed and given to the patient.  FOLLOW UP:  Return in about 1 year (around 11/14/2025) for annual CPE (fasting).  Signed:  Gerlene Hockey, MD           11/14/2024

## 2024-11-14 NOTE — Patient Instructions (Signed)
 Health Maintenance, Male  Adopting a healthy lifestyle and getting preventive care are important in promoting health and wellness. Ask your health care provider about:  The right schedule for you to have regular tests and exams.  Things you can do on your own to prevent diseases and keep yourself healthy.  What should I know about diet, weight, and exercise?  Eat a healthy diet    Eat a diet that includes plenty of vegetables, fruits, low-fat dairy products, and lean protein.  Do not eat a lot of foods that are high in solid fats, added sugars, or sodium.  Maintain a healthy weight  Body mass index (BMI) is a measurement that can be used to identify possible weight problems. It estimates body fat based on height and weight. Your health care provider can help determine your BMI and help you achieve or maintain a healthy weight.  Get regular exercise  Get regular exercise. This is one of the most important things you can do for your health. Most adults should:  Exercise for at least 150 minutes each week. The exercise should increase your heart rate and make you sweat (moderate-intensity exercise).  Do strengthening exercises at least twice a week. This is in addition to the moderate-intensity exercise.  Spend less time sitting. Even light physical activity can be beneficial.  Watch cholesterol and blood lipids  Have your blood tested for lipids and cholesterol at 76 years of age, then have this test every 5 years.  You may need to have your cholesterol levels checked more often if:  Your lipid or cholesterol levels are high.  You are older than 76 years of age.  You are at high risk for heart disease.  What should I know about cancer screening?  Many types of cancers can be detected early and may often be prevented. Depending on your health history and family history, you may need to have cancer screening at various ages. This may include screening for:  Colorectal cancer.  Prostate cancer.  Skin cancer.  Lung  cancer.  What should I know about heart disease, diabetes, and high blood pressure?  Blood pressure and heart disease  High blood pressure causes heart disease and increases the risk of stroke. This is more likely to develop in people who have high blood pressure readings or are overweight.  Talk with your health care provider about your target blood pressure readings.  Have your blood pressure checked:  Every 3-5 years if you are 24-52 years of age.  Every year if you are 3 years old or older.  If you are between the ages of 60 and 72 and are a current or former smoker, ask your health care provider if you should have a one-time screening for abdominal aortic aneurysm (AAA).  Diabetes  Have regular diabetes screenings. This checks your fasting blood sugar level. Have the screening done:  Once every three years after age 66 if you are at a normal weight and have a low risk for diabetes.  More often and at a younger age if you are overweight or have a high risk for diabetes.  What should I know about preventing infection?  Hepatitis B  If you have a higher risk for hepatitis B, you should be screened for this virus. Talk with your health care provider to find out if you are at risk for hepatitis B infection.  Hepatitis C  Blood testing is recommended for:  Everyone born from [birth year] through 1963.  Anyone  with known risk factors for hepatitis C.  Sexually transmitted infections (STIs)  You should be screened each year for STIs, including gonorrhea and chlamydia, if:  You are sexually active and are younger than 76 years of age.  You are older than 76 years of age and your health care provider tells you that you are at risk for this type of infection.  Your sexual activity has changed since you were last screened, and you are at increased risk for chlamydia or gonorrhea. Ask your health care provider if you are at risk.  Ask your health care provider about whether you are at high risk for HIV. Your health care provider  may recommend a prescription medicine to help prevent HIV infection. If you choose to take medicine to prevent HIV, you should first get tested for HIV. You should then be tested every 3 months for as long as you are taking the medicine.  Follow these instructions at home:  Alcohol use  Do not drink alcohol if your health care provider tells you not to drink.  If you drink alcohol:  Limit how much you have to 0-2 drinks a day.  Know how much alcohol is in your drink. In the [place] one drink equals one 12 oz bottle of beer (355 mL), one 5 oz glass of wine (148 mL), or one 1 oz glass of hard liquor (44 mL).  Lifestyle  Do not use any products that contain nicotine or tobacco. These products include cigarettes, chewing tobacco, and vaping devices, such as e-cigarettes. If you need help quitting, ask your health care provider.  Do not use street drugs.  Do not share needles.  Ask your health care provider for help if you need support or information about quitting drugs.  General instructions  Schedule regular health, dental, and eye exams.  Stay current with your vaccines.  Tell your health care provider if:  You often feel depressed.  You have ever been abused or do not feel safe at home.  Summary  Adopting a healthy lifestyle and getting preventive care are important in promoting health and wellness.  Follow your health care provider's instructions about healthy diet, exercising, and getting tested or screened for diseases.  Follow your health care provider's instructions on monitoring your cholesterol and blood pressure.  This information is not intended to replace advice given to you by your health care provider. Make sure you discuss any questions you have with your health care provider.  Document Revised: 04/28/2021 Document Reviewed: 04/28/2021  Elsevier Patient Education  2024 [employer].

## 2024-11-15 ENCOUNTER — Ambulatory Visit: Attending: Cardiology | Admitting: *Deleted

## 2024-11-15 DIAGNOSIS — Q2543 Congenital aneurysm of aorta: Secondary | ICD-10-CM

## 2024-11-15 DIAGNOSIS — Z5181 Encounter for therapeutic drug level monitoring: Secondary | ICD-10-CM

## 2024-11-15 DIAGNOSIS — Z9889 Other specified postprocedural states: Secondary | ICD-10-CM

## 2024-11-15 DIAGNOSIS — Z954 Presence of other heart-valve replacement: Secondary | ICD-10-CM

## 2024-11-15 LAB — POCT INR: INR: 1.9 — AB (ref 2.0–3.0)

## 2024-11-15 NOTE — Patient Instructions (Signed)
 Description   INR-1.9; Today take another 1/2 tablet of warfarin then START taking taking 1 tablet on SUNDAYS, WEDNESDAYS, AND FRIDAYS.  Recheck INR in 5 weeks.  Stay consistent with greens.  Coumadin  Clinic [phone]

## 2024-11-15 NOTE — Progress Notes (Signed)
 Description   INR-1.9; Today take another 1/2 tablet of warfarin then START taking taking 1 tablet on SUNDAYS, WEDNESDAYS, AND FRIDAYS.  Recheck INR in 5 weeks.  Stay consistent with greens.  Coumadin  Clinic [phone]

## 2024-11-19 ENCOUNTER — Ambulatory Visit: Payer: Self-pay | Admitting: Family Medicine

## 2024-11-19 ENCOUNTER — Encounter: Payer: Self-pay | Admitting: Family Medicine

## 2024-12-20 ENCOUNTER — Ambulatory Visit: Attending: Cardiology | Admitting: *Deleted

## 2024-12-20 DIAGNOSIS — Z9889 Other specified postprocedural states: Secondary | ICD-10-CM | POA: Diagnosis not present

## 2024-12-20 DIAGNOSIS — Z5181 Encounter for therapeutic drug level monitoring: Secondary | ICD-10-CM | POA: Diagnosis not present

## 2024-12-20 LAB — POCT INR: POC INR: 2.6

## 2024-12-20 NOTE — Patient Instructions (Signed)
 Description   INR 2.6: Continue to take warfarin 1/2 a tablet daily except for 1 tablet on Sunday, Wednesday and Friday. Eat a serving of greens today. Recheck INR in 4 weeks. Coumadin  Clinic [phone]

## 2024-12-20 NOTE — Progress Notes (Signed)
 Lab Results  Component Value Date   INR 2.6 12/20/2024   INR 1.9 (A) 11/15/2024   INR 1.9 (A) 10/11/2024    Description   INR 2.6: Continue to take warfarin 1/2 a tablet daily except for 1 tablet on Sunday, Wednesday and Friday. Eat a serving of greens today. Recheck INR in 4 weeks. Coumadin  Clinic [phone]

## 2024-12-25 ENCOUNTER — Ambulatory Visit (INDEPENDENT_AMBULATORY_CARE_PROVIDER_SITE_OTHER)

## 2024-12-25 DIAGNOSIS — Z954 Presence of other heart-valve replacement: Secondary | ICD-10-CM | POA: Diagnosis not present

## 2024-12-25 DIAGNOSIS — Z9889 Other specified postprocedural states: Secondary | ICD-10-CM

## 2024-12-25 DIAGNOSIS — I420 Dilated cardiomyopathy: Secondary | ICD-10-CM

## 2024-12-25 DIAGNOSIS — I10 Essential (primary) hypertension: Secondary | ICD-10-CM

## 2024-12-25 LAB — ECHOCARDIOGRAM COMPLETE
AR max vel: 2.12 cm2
AV Area VTI: 2.07 cm2
AV Area mean vel: 1.98 cm2
AV Mean grad: 5 mmHg
AV Peak grad: 9 mmHg
Ao pk vel: 1.5 m/s
Area-P 1/2: 2.37 cm2
MV VTI: 1.34 cm2
S' Lateral: 3.95 cm

## 2024-12-28 ENCOUNTER — Other Ambulatory Visit: Payer: Self-pay

## 2024-12-28 DIAGNOSIS — Z954 Presence of other heart-valve replacement: Secondary | ICD-10-CM

## 2024-12-28 DIAGNOSIS — Z9889 Other specified postprocedural states: Secondary | ICD-10-CM

## 2024-12-28 MED ORDER — WARFARIN SODIUM 5 MG PO TABS: ORAL_TABLET | ORAL | 1 refills | Status: AC

## 2025-01-16 ENCOUNTER — Ambulatory Visit: Attending: Cardiology | Admitting: *Deleted

## 2025-01-16 DIAGNOSIS — Z954 Presence of other heart-valve replacement: Secondary | ICD-10-CM

## 2025-01-16 DIAGNOSIS — Z5181 Encounter for therapeutic drug level monitoring: Secondary | ICD-10-CM | POA: Diagnosis not present

## 2025-01-16 DIAGNOSIS — Q2543 Congenital aneurysm of aorta: Secondary | ICD-10-CM | POA: Diagnosis not present

## 2025-01-16 DIAGNOSIS — Z9889 Other specified postprocedural states: Secondary | ICD-10-CM

## 2025-01-16 LAB — POCT INR: INR: 3.2 — AB (ref 2.0–3.0)

## 2025-01-16 NOTE — Patient Instructions (Signed)
 Description   INR-3.2; Do not take any warfarin tomorrow (already had today's dose) then continue to take warfarin 1/2 a tablet daily except for 1 tablet on Sunday, Wednesday and Friday. Eat a serving of greens today. Recheck INR in 3 weeks. Coumadin  Clinic [phone]

## 2025-01-16 NOTE — Progress Notes (Signed)
 Description   INR-3.2; Do not take any warfarin tomorrow (already had today's dose) then continue to take warfarin 1/2 a tablet daily except for 1 tablet on Sunday, Wednesday and Friday. Eat a serving of greens today. Recheck INR in 3 weeks. Coumadin  Clinic [phone]

## 2025-02-08 ENCOUNTER — Ambulatory Visit: Admitting: Emergency Medicine

## 2025-02-08 ENCOUNTER — Ambulatory Visit

## 2025-04-25 ENCOUNTER — Encounter

## 2025-05-31 ENCOUNTER — Ambulatory Visit: Admitting: Dermatology

## 2025-11-19 ENCOUNTER — Encounter: Admitting: Family Medicine
